# Patient Record
Sex: Female | Born: 1970
Health system: Southern US, Community
[De-identification: ages and names within clinical notes are randomized; demographics above are authoritative.]

## PROBLEM LIST (undated history)

## (undated) DIAGNOSIS — K8020 Calculus of gallbladder without cholecystitis without obstruction: Secondary | ICD-10-CM

## (undated) DIAGNOSIS — F329 Major depressive disorder, single episode, unspecified: Secondary | ICD-10-CM

## (undated) DIAGNOSIS — Z800 Family history of malignant neoplasm of digestive organs: Secondary | ICD-10-CM

## (undated) DIAGNOSIS — Z806 Family history of leukemia: Secondary | ICD-10-CM

## (undated) DIAGNOSIS — Z801 Family history of malignant neoplasm of trachea, bronchus and lung: Secondary | ICD-10-CM

## (undated) DIAGNOSIS — E119 Type 2 diabetes mellitus without complications: Secondary | ICD-10-CM

## (undated) DIAGNOSIS — E039 Hypothyroidism, unspecified: Secondary | ICD-10-CM

## (undated) DIAGNOSIS — Z8042 Family history of malignant neoplasm of prostate: Secondary | ICD-10-CM

## (undated) DIAGNOSIS — F419 Anxiety disorder, unspecified: Secondary | ICD-10-CM

## (undated) DIAGNOSIS — J189 Pneumonia, unspecified organism: Secondary | ICD-10-CM

## (undated) DIAGNOSIS — Z803 Family history of malignant neoplasm of breast: Secondary | ICD-10-CM

## (undated) DIAGNOSIS — Z975 Presence of (intrauterine) contraceptive device: Secondary | ICD-10-CM

## (undated) DIAGNOSIS — G4730 Sleep apnea, unspecified: Secondary | ICD-10-CM

## (undated) HISTORY — DX: Family history of malignant neoplasm of breast: Z80.3

## (undated) HISTORY — DX: Anxiety disorder, unspecified: F41.9

## (undated) HISTORY — DX: Family history of malignant neoplasm of trachea, bronchus and lung: Z80.1

## (undated) HISTORY — DX: Family history of malignant neoplasm of prostate: Z80.42

## (undated) HISTORY — DX: Major depressive disorder, single episode, unspecified: F32.9

## (undated) HISTORY — DX: Family history of malignant neoplasm of digestive organs: Z80.0

## (undated) HISTORY — DX: Hypothyroidism, unspecified: E03.9

## (undated) HISTORY — DX: Family history of leukemia: Z80.6

## (undated) HISTORY — DX: Calculus of gallbladder without cholecystitis without obstruction: K80.20

## (undated) HISTORY — DX: Presence of (intrauterine) contraceptive device: Z97.5

---

## 1998-05-16 HISTORY — PX: CHOLECYSTECTOMY: SHX55

## 2001-08-14 ENCOUNTER — Encounter: Payer: Self-pay | Admitting: Family Medicine

## 2006-11-24 ENCOUNTER — Ambulatory Visit: Payer: Self-pay | Admitting: Family Medicine

## 2006-11-24 DIAGNOSIS — R5383 Other fatigue: Secondary | ICD-10-CM

## 2006-11-24 DIAGNOSIS — R5381 Other malaise: Secondary | ICD-10-CM | POA: Insufficient documentation

## 2006-11-25 LAB — CONVERTED CEMR LAB
ALT: 25 units/L (ref 0–35)
AST: 22 units/L (ref 0–37)
Basophils Relative: 0.3 % (ref 0.0–1.0)
Bilirubin, Direct: 0.1 mg/dL (ref 0.0–0.3)
CO2: 29 meq/L (ref 19–32)
Calcium: 10.3 mg/dL (ref 8.4–10.5)
Chloride: 102 meq/L (ref 96–112)
Eosinophils Relative: 0.9 % (ref 0.0–5.0)
GFR calc Af Amer: 104 mL/min
Glucose, Bld: 96 mg/dL (ref 70–99)
HCT: 42.9 % (ref 36.0–46.0)
Lymphocytes Relative: 26.2 % (ref 12.0–46.0)
Neutro Abs: 7.3 10*3/uL (ref 1.4–7.7)
Neutrophils Relative %: 66.6 % (ref 43.0–77.0)
Platelets: 289 10*3/uL (ref 150–400)
Total Protein: 7.9 g/dL (ref 6.0–8.3)
Triglycerides: 150 mg/dL — ABNORMAL HIGH (ref 0–149)
VLDL: 30 mg/dL (ref 0–40)
WBC: 11 10*3/uL — ABNORMAL HIGH (ref 4.5–10.5)

## 2006-12-14 ENCOUNTER — Encounter: Payer: Self-pay | Admitting: Family Medicine

## 2006-12-14 DIAGNOSIS — E039 Hypothyroidism, unspecified: Secondary | ICD-10-CM | POA: Insufficient documentation

## 2007-01-12 ENCOUNTER — Ambulatory Visit: Payer: Self-pay | Admitting: Family Medicine

## 2007-01-12 DIAGNOSIS — F39 Unspecified mood [affective] disorder: Secondary | ICD-10-CM | POA: Insufficient documentation

## 2007-02-28 ENCOUNTER — Ambulatory Visit: Payer: Self-pay | Admitting: Family Medicine

## 2007-08-20 ENCOUNTER — Ambulatory Visit: Payer: Self-pay | Admitting: Family Medicine

## 2007-09-17 ENCOUNTER — Telehealth: Payer: Self-pay | Admitting: Family Medicine

## 2007-10-12 ENCOUNTER — Ambulatory Visit: Payer: Self-pay | Admitting: Unknown Physician Specialty

## 2007-10-16 ENCOUNTER — Encounter: Payer: Self-pay | Admitting: Family Medicine

## 2007-10-16 ENCOUNTER — Ambulatory Visit: Payer: Self-pay | Admitting: Unknown Physician Specialty

## 2008-02-04 ENCOUNTER — Telehealth: Payer: Self-pay | Admitting: Family Medicine

## 2009-02-17 ENCOUNTER — Encounter (INDEPENDENT_AMBULATORY_CARE_PROVIDER_SITE_OTHER): Payer: Self-pay | Admitting: *Deleted

## 2009-02-17 ENCOUNTER — Telehealth: Payer: Self-pay | Admitting: Family Medicine

## 2009-03-05 ENCOUNTER — Ambulatory Visit: Payer: Self-pay | Admitting: Family Medicine

## 2009-04-02 ENCOUNTER — Telehealth: Payer: Self-pay | Admitting: Family Medicine

## 2009-12-08 ENCOUNTER — Telehealth: Payer: Self-pay | Admitting: Family Medicine

## 2009-12-17 ENCOUNTER — Encounter (INDEPENDENT_AMBULATORY_CARE_PROVIDER_SITE_OTHER): Payer: Self-pay | Admitting: *Deleted

## 2010-02-09 ENCOUNTER — Telehealth: Payer: Self-pay | Admitting: Family Medicine

## 2010-06-17 NOTE — Letter (Signed)
Summary: Nadara Eaton letter  [facility] [facility]  [address] [place], [place] [address]   Phone: [phone]  Fax: [phone]       12/17/2009 MRN: [id number]  MONZERAT HANDLER [address] [place], [place]  [address]  Dear Ms. [facility] [place], and [place] announce the retirement of Arta Silence, M.D., from full-time practice at the [facility] office effective November 12, 2009 and his plans of returning part-time.  It is important to Dr. Hetty Ely and to our practice that you understand that [facility] has seven physicians in our office for your health care needs.  We will continue to offer the same exceptional care that you have today.    Dr. Hetty Ely has spoken to many of you about his plans for retirement and returning part-time in the fall.   We will continue to work with you through the transition to schedule appointments for you in the office and meet the high standards that [place] is committed to.   Again, it is with great pleasure that we share the news that Dr. Hetty Ely will return to [facility] at [facility] in October of 2011 with a reduced schedule.    If you have any questions, or would like to request an appointment with one of our physicians, please call us at [phone] and press the option for Scheduling an appointment.  We take pleasure in providing you with excellent patient care and look forward to seeing you at your next office visit.  Our [facility] Physicians are:  Tillman Abide, M.D. Laurita Quint, M.D. Roxy Manns, M.D. Kerby Nora, M.D. Hannah Beat, M.D. Ruthe Mannan, M.D. We proudly welcomed Raechel Ache, M.D. and Eustaquio Boyden, M.D. to the practice in July/August 2011.  Sincerely,  [facility]

## 2010-06-17 NOTE — Progress Notes (Signed)
Summary: patches for motion sickness  Phone Note Call from Patient   Caller: Patient Call For: Dr. Para March  Summary of Call: Pt is going on a 10 day cruise and is asking if he can have [facility] [address]  Initial call taken by: Melody Comas,  December 08, 2009 2:18 PM  Follow-up for Phone Call        yes, please send in.  1.5mg  scopolamine patch, 1 patch q3 days as needed.  #4, no rf.  Follow-up by: Crawford Givens MD,  December 08, 2009 2:42 PM    New/Updated Medications: TRANSDERM-SCOP 1.5 MG PT72 (SCOPOLAMINE BASE) Use one patch every 3 days as needed Prescriptions: TRANSDERM-SCOP 1.5 MG PT72 (SCOPOLAMINE BASE) Use one patch every 3 days as needed  #1 box x 0   Entered by:   Delilah Shan CMA (AAMA)   Authorized by:   Crawford Givens MD   Signed by:   Delilah Shan CMA (AAMA) on 12/08/2009   Method used:   [facility]        [facility]  [address] [id number]       [address]       [place], [place]  [address]       Ph: [phone]       Fax: [phone]   RxID:   [id number]

## 2010-06-17 NOTE — Progress Notes (Signed)
Summary: Rx Zoloft  Phone Note Refill Request Call back at [phone] [facility] on February 09, 2010 2:56 PM  Refills Requested: Medication #1:  ZOLOFT 25 MG  TABS Take 1 tablet by mouth once a day Received e-scribe refill request   Method Requested: Electronic Initial call taken by: Sydell Axon LPN,  February 09, 2010 2:57 PM  Follow-up for Phone Call        please verify the pharmacy and send in.  patient needs ov this fall, last seen 2010. Follow-up by: Crawford Givens MD,  February 09, 2010 3:10 PM    Prescriptions: ZOLOFT 25 MG  TABS (SERTRALINE HCL) Take 1 tablet by mouth once a day  #30 x 3   Entered by:   Delilah Shan CMA (AAMA)   Authorized by:   Crawford Givens MD   Signed by:   Delilah Shan CMA (AAMA) on 02/09/2010   Method used:   [facility]        [facility]  [address] [id number]       [address]       [place], [place]  [address]       Ph: [phone]       Fax: [phone]   RxID:   [id number] ZOLOFT 25 MG  TABS (SERTRALINE HCL) Take 1 tablet by mouth once a day  #30 x 3   Entered and Authorized by:   Crawford Givens MD   Signed by:   Crawford Givens MD on 02/09/2010   Method used:   Historical   RxID:   [id number]

## 2010-08-26 ENCOUNTER — Encounter: Payer: Self-pay | Admitting: Family Medicine

## 2010-08-30 ENCOUNTER — Other Ambulatory Visit: Payer: Self-pay | Admitting: Family Medicine

## 2010-08-30 ENCOUNTER — Other Ambulatory Visit (INDEPENDENT_AMBULATORY_CARE_PROVIDER_SITE_OTHER): Payer: BC Managed Care – PPO | Admitting: Family Medicine

## 2010-08-30 DIAGNOSIS — E039 Hypothyroidism, unspecified: Secondary | ICD-10-CM

## 2010-09-03 ENCOUNTER — Ambulatory Visit (INDEPENDENT_AMBULATORY_CARE_PROVIDER_SITE_OTHER): Payer: BC Managed Care – PPO | Admitting: Family Medicine

## 2010-09-03 ENCOUNTER — Encounter: Payer: Self-pay | Admitting: Family Medicine

## 2010-09-03 VITALS — BP 122/76 | HR 88 | Temp 98.2°F | Ht 64.0 in | Wt 179.0 lb

## 2010-09-03 DIAGNOSIS — Z0000 Encounter for general adult medical examination without abnormal findings: Secondary | ICD-10-CM

## 2010-09-03 DIAGNOSIS — Z23 Encounter for immunization: Secondary | ICD-10-CM

## 2010-09-03 DIAGNOSIS — E039 Hypothyroidism, unspecified: Secondary | ICD-10-CM

## 2010-09-03 MED ORDER — TETANUS-DIPHTH-ACELL PERTUSSIS 5-2.5-18.5 LF-MCG/0.5 IM SUSP: 0.5000 mL | Freq: Once | INTRAMUSCULAR | Status: DC

## 2010-09-03 NOTE — Patient Instructions (Signed)
I would keep exercising and try to get regularly scheduled meals.  If your weight isn't decreasing, then look at your total daily calorie intake and decrease by 100 cal.  I would check your labs (TSH, lipids, sugar) next year.  Take care.  Let us know if you have other concerns.

## 2010-09-03 NOTE — Progress Notes (Signed)
CPE- See plan.  Routine anticipatory guidance given to patient.  See health maintenance. Pap and mammo per gyn  Hypothyroidism- labs d/w pt.  No sx.  No meds.   Feeling well w/o complaints of any variety except for nodular area on b mid axillary line  PMH and SH reviewed  Meds, vitals, and allergies reviewed.   ROS: See HPI.  Otherwise negative.    GEN: nad, alert and oriented HEENT: mucous membranes moist NECK: supple w/o LA, no tmg CV: rrr. PULM: ctab, no inc wob ABD: soft, +bs EXT: no edema SKIN: no acute rash but nodular areas of likely subcutaneous fat palpated on abdominal wall.

## 2010-09-05 ENCOUNTER — Encounter: Payer: Self-pay | Admitting: Family Medicine

## 2010-09-05 NOTE — Assessment & Plan Note (Signed)
I would check TSH episodically, sooner if she has sx.  No tx needed now.

## 2010-09-05 NOTE — Assessment & Plan Note (Signed)
D/w pt.  We didn't check sugar and lipids as these were prev normal. D/w pt about exercise and weight, dec in calories gradually if no dec in weight with inc in exercise.  She understood.  Flu shot encouraged.  Tdap today.  I would check lipids, sugar in 1-2 years.

## 2011-02-17 ENCOUNTER — Encounter: Payer: Self-pay | Admitting: Family Medicine

## 2011-02-17 ENCOUNTER — Ambulatory Visit (INDEPENDENT_AMBULATORY_CARE_PROVIDER_SITE_OTHER): Payer: BC Managed Care – PPO | Admitting: Family Medicine

## 2011-02-17 VITALS — BP 104/72 | HR 86 | Temp 98.2°F | Wt 173.0 lb

## 2011-02-17 DIAGNOSIS — F419 Anxiety disorder, unspecified: Secondary | ICD-10-CM

## 2011-02-17 DIAGNOSIS — Z23 Encounter for immunization: Secondary | ICD-10-CM

## 2011-02-17 DIAGNOSIS — F411 Generalized anxiety disorder: Secondary | ICD-10-CM

## 2011-02-17 MED ORDER — SERTRALINE HCL 25 MG PO TABS: 25.0000 mg | ORAL_TABLET | Freq: Every day | ORAL | Status: DC

## 2011-02-17 NOTE — Progress Notes (Signed)
She has a teenage son and a lot of increase in stress in your life.  She is in counseling for anxiety.  "Today I'm fine, but some days are better than others."  She wanted to talk about going back on meds, prev on sertraline with relief.  No SI/HI.  Sleeping okay now, better than a few weeks ago.  Energy level is better in last few weeks.  Timing of anxiety sx are variable, disagreements with her son is a trigger.  Anxiety episode: crying, upset, nervous.  Episodes may be brief or may last longer.  She thinks she is in a better position now than a few weeks ago.  She initially had HA with sertraline before, but this improved after about 1 month.    Meds, vitals, and allergies reviewed.   ROS: See HPI.  Otherwise, noncontributory.  nad ncat rrr ctab Speech wnl, affect wnl and judgment appears intact

## 2011-02-17 NOTE — Patient Instructions (Signed)
Hold onto the rx for the sertraline and let me know if you aren't improving or if you need to start the medicine.  Let me know if you have other concerns.  Take care.  I'm glad you started with counseling.

## 2011-02-18 ENCOUNTER — Encounter: Payer: Self-pay | Admitting: Family Medicine

## 2011-02-18 DIAGNOSIS — F419 Anxiety disorder, unspecified: Secondary | ICD-10-CM | POA: Insufficient documentation

## 2011-02-18 NOTE — Assessment & Plan Note (Signed)
>  25 min spent with face to face with patient, >50% counseling.  She is in counseling and improving per report.  She does have to go back on meds, esp if she continue to improve.  No SI/HI.  Okay for outpatient f/u.  She'll continue with counseling and will restart SSRI if sx don't continue to improve. If that's the case, she'll notify the clinic.  She agrees.

## 2011-12-13 ENCOUNTER — Telehealth: Payer: Self-pay | Admitting: Family Medicine

## 2011-12-13 NOTE — Telephone Encounter (Signed)
PATIENT REQUESTING VERIFICATION OF LAST TETANUS IMMUNIZATION DUE TO TRAVEL ARRANGEMENTS. RN REVIEWED EPIC EHR: TDaP (BOOSTRIX) injection 0.5 mL (Discontinued) 0.5 mL Once 09/03/2010 02/18/2011 Route: Intramuscular RN UNABLE TO VERIFY DUE TO ABOVE DOCUMENTATION OF "DISCONTINUED".  RN spoke with Lyla Son, in office, to verify above Tetanus immunization hx. Lyla Son, in office, verified that patient did receive Tetanus immunization on 09/03/2010. Patient informed of above. Verbalizes understanding.

## 2012-01-18 ENCOUNTER — Ambulatory Visit (INDEPENDENT_AMBULATORY_CARE_PROVIDER_SITE_OTHER): Payer: BC Managed Care – PPO | Admitting: Family Medicine

## 2012-01-18 ENCOUNTER — Encounter: Payer: Self-pay | Admitting: Family Medicine

## 2012-01-18 VITALS — BP 110/68 | HR 80 | Temp 98.6°F | Ht 64.0 in | Wt 174.2 lb

## 2012-01-18 DIAGNOSIS — M25519 Pain in unspecified shoulder: Secondary | ICD-10-CM

## 2012-01-18 NOTE — Assessment & Plan Note (Signed)
Likely cuff irritation.  Would try higher dose of ibuprofen with GI caution and home exercises.  If not improved, then schedule f/u with Dr. Patsy Lager for further eval and possible injection.  She agrees.  Anatomy d/w pt.

## 2012-01-18 NOTE — Progress Notes (Signed)
L shoulder pain with certain movements.  Started about 2 months ago, gradually worse in meantime.  No help with chiropractor treatment.  Pain with reaching back, ie for back pocket.  Pain on lateral prox upper arm, occ radiates down the arm.  No injury, no popping, no clicking.  No trauma.  No elbow or hand pain.  On R sided sx.   Feeling well o/w.    Meds, vitals, and allergies reviewed.   ROS: See HPI.  Otherwise, noncontributory.  nad ncat Neck with normal ROM L shoulder with normal ROM Pain on int/ext rotation. Pain on supraspinatus testing No AC pain on testing Speed's neg No arm drop Distally nv intact

## 2012-01-18 NOTE — Patient Instructions (Addendum)
Take 2-3 ibuprofen with food 2-3 times a day.   Use the exercises and if not improved I want you to see Dr. Copland.   Take care.   

## 2012-05-07 ENCOUNTER — Other Ambulatory Visit: Payer: Self-pay

## 2012-05-07 MED ORDER — SCOPOLAMINE 1 MG/3DAYS TD PT72: 1.0000 | MEDICATED_PATCH | TRANSDERMAL | Status: DC

## 2012-05-07 NOTE — Telephone Encounter (Signed)
Sent. Thanks.   

## 2012-05-07 NOTE — Telephone Encounter (Signed)
Pt left v/m requesting [facility]; going on cruise next week.Please advise.

## 2012-06-04 ENCOUNTER — Encounter: Payer: Self-pay | Admitting: Family Medicine

## 2012-06-04 ENCOUNTER — Ambulatory Visit (INDEPENDENT_AMBULATORY_CARE_PROVIDER_SITE_OTHER): Payer: BC Managed Care – PPO | Admitting: Family Medicine

## 2012-06-04 VITALS — BP 114/82 | HR 103 | Temp 98.6°F | Wt 179.0 lb

## 2012-06-04 DIAGNOSIS — R51 Headache: Secondary | ICD-10-CM

## 2012-06-04 NOTE — Patient Instructions (Addendum)
I would avoid lifting for a few more days.  When you have no pain, then start back stretching (especially your neck).  Then proceed to light lifting.  This should all improve in the next few days.  If you have concerns or persistent pain, then notify the clinic. The flexeril may continue to help some.  Sedation caution.

## 2012-06-04 NOTE — Progress Notes (Signed)
She is getting pain on the back of her head that radiates around the front of the head equally, burning pain.  Over the last 5 days she also has R>L frontal pain this is persistent.  She had been exercising, recent change.  The discomfort in her neck started then.  She noted a lump on the R posterior scalp recently.  No FCNAVD.  No ear pain.  No hearing changes. No rhinorrhea, no ST.  No vision changes.  No focal changes or pain in the ext x4.  Taking flexeril helped some with the HA, not completely.  It did make her drowsy.  Tylenol/advil/alevel weren't helping much.   No injury or trauma o/w.   Meds, vitals, and allergies reviewed.   ROS: See HPI.  Otherwise, noncontributory.  nad ncat Scalp with patches of psoriasis noted Normal ROM at the neck Isolated small and nontender lymph noted notes on R occiput No LA on neck , clavicle, head o/w rrr ctab Tm wnl Nasal and OP exam wnl CN 2-12 wnl B, S/S/DTR wnl x4 Sinuses not ttp

## 2012-06-05 DIAGNOSIS — R51 Headache: Secondary | ICD-10-CM | POA: Insufficient documentation

## 2012-06-05 DIAGNOSIS — R519 Headache, unspecified: Secondary | ICD-10-CM | POA: Insufficient documentation

## 2012-06-05 NOTE — Assessment & Plan Note (Signed)
Likely with an occipital neuralgia (related to exercise, strain; improving) and resultant frontal HA (possible migraine variant- h/o migraine noted, improving) with incidental isolated LA noted.  Would avoid exercise, neck strain for now.  Then return to stretching and then gradual return to exercise.  She can use the flexeril as needed with 5mg  at a time if needed due to sedation.  Her exam is o/w nonfocal.  She should gradually improve.  No indication for imaging now.  She agrees, understands, f/u prn.

## 2012-06-06 ENCOUNTER — Telehealth: Payer: Self-pay | Admitting: *Deleted

## 2012-06-06 MED ORDER — GABAPENTIN 100 MG PO CAPS: 100.0000 mg | ORAL_CAPSULE | Freq: Three times a day (TID) | ORAL | Status: DC

## 2012-06-06 NOTE — Telephone Encounter (Signed)
Pt seen recently for headache. Yesterday she was putting a jug of milk away and headache started again. Last night it was very intense, but today it is more of a dull ache. She has taken Flexeril, Advil, and Aleve but so far nothing has helped get rid of headache. She wants to know if you can recommend anything or prescribe another med that might help.

## 2012-06-06 NOTE — Telephone Encounter (Signed)
Caller: Ceira/Patient; Phone: [phone]; Reason for Call: Returning call to office staff/Lori from message left approximately at 1445.  Per Epic message from Dr Para March 06/06/12, instructed to "limit as much lifting as possible.  She can try uptitration of gabapentin, 100mg  tid x1-2 days, then 200mg  tid x1-2 days, then 300mg  tid.  Rx sent.  If not improved, we can set her up with neuro."  Sedation precautions discussed with the Gabapentin.  Message delivered; No need to call back.

## 2012-06-06 NOTE — Telephone Encounter (Signed)
I would limit as much lifting as possible.  She can try uptitration of gabapentin, 100mg  tid x1-2 days, then 200mg  tid x1-2 days, then 300mg  tid.  rx sent.  If not improved, we can set her up with neuro.  Sedation caution on the gabapentin.

## 2012-06-06 NOTE — Telephone Encounter (Signed)
Left message asking patient to call back

## 2012-06-20 ENCOUNTER — Telehealth: Payer: Self-pay

## 2012-06-20 MED ORDER — GABAPENTIN 300 MG PO CAPS: 300.0000 mg | ORAL_CAPSULE | Freq: Three times a day (TID) | ORAL | Status: DC

## 2012-06-20 NOTE — Telephone Encounter (Signed)
Please verify with patient.  If she is talking 300mg  (3x100mg ) at a time, 3 times a day with relief, then send in new rx (with new strength) for 300mg /tab- 1 tab tid.  #90 3rf.  Thanks.  Otherwise, let me know.

## 2012-06-20 NOTE — Telephone Encounter (Signed)
Verified dosage with patient.  Explained to her that new script is being sent to pharmacy, for her to take one capsule three times a day.  [facility]

## 2012-06-20 NOTE — Telephone Encounter (Signed)
Pt left v/m pt was taking Gabapentin 100 mg and pt taking 1 cap tid for 1-2 days, then 2 caps tid for 1-2 days and then 3 cap tid. Pt tried to refill #90 and pharmacy said too early. Pt request call back to verify instructions on how to take med and what can do to get refill.Please advise.

## 2013-06-03 ENCOUNTER — Encounter: Payer: BC Managed Care – PPO | Admitting: Family Medicine

## 2013-06-04 ENCOUNTER — Other Ambulatory Visit (INDEPENDENT_AMBULATORY_CARE_PROVIDER_SITE_OTHER): Payer: BC Managed Care – PPO

## 2013-06-04 ENCOUNTER — Other Ambulatory Visit: Payer: Self-pay | Admitting: Family Medicine

## 2013-06-04 ENCOUNTER — Other Ambulatory Visit: Payer: BC Managed Care – PPO | Admitting: Family Medicine

## 2013-06-04 DIAGNOSIS — Z131 Encounter for screening for diabetes mellitus: Secondary | ICD-10-CM

## 2013-06-04 DIAGNOSIS — Z13220 Encounter for screening for lipoid disorders: Secondary | ICD-10-CM

## 2013-06-04 DIAGNOSIS — E039 Hypothyroidism, unspecified: Secondary | ICD-10-CM

## 2013-06-04 LAB — LIPID PANEL
CHOL/HDL RATIO: 3
CHOLESTEROL: 132 mg/dL (ref 0–200)
HDL: 40.6 mg/dL (ref >39.00)
LDL CALC: 66 mg/dL (ref 0–99)
TRIGLYCERIDES: 129 mg/dL (ref 0.0–149.0)
VLDL: 25.8 mg/dL (ref 0.0–40.0)

## 2013-06-04 LAB — GLUCOSE, RANDOM: Glucose, Bld: 106 mg/dL — ABNORMAL HIGH (ref 70–99)

## 2013-06-04 LAB — TSH: TSH: 4.75 u[IU]/mL (ref 0.35–5.50)

## 2013-06-10 ENCOUNTER — Encounter: Payer: BC Managed Care – PPO | Admitting: Family Medicine

## 2013-06-17 ENCOUNTER — Encounter: Payer: Self-pay | Admitting: Family Medicine

## 2013-06-17 ENCOUNTER — Ambulatory Visit (INDEPENDENT_AMBULATORY_CARE_PROVIDER_SITE_OTHER): Payer: BC Managed Care – PPO | Admitting: Family Medicine

## 2013-06-17 ENCOUNTER — Encounter: Payer: BC Managed Care – PPO | Admitting: Family Medicine

## 2013-06-17 VITALS — BP 118/80 | HR 92 | Temp 98.6°F | Ht 63.25 in | Wt 176.0 lb

## 2013-06-17 DIAGNOSIS — F419 Anxiety disorder, unspecified: Secondary | ICD-10-CM

## 2013-06-17 DIAGNOSIS — F411 Generalized anxiety disorder: Secondary | ICD-10-CM

## 2013-06-17 MED ORDER — ESCITALOPRAM OXALATE 10 MG PO TABS: 10.0000 mg | ORAL_TABLET | Freq: Every day | ORAL | Status: DC

## 2013-06-17 MED ORDER — CYCLOBENZAPRINE HCL 10 MG PO TABS: 5.0000 mg | ORAL_TABLET | Freq: Every day | ORAL | Status: DC | PRN

## 2013-06-17 NOTE — Progress Notes (Signed)
Pre-visit discussion using our clinic review tool. No additional management support is needed unless otherwise documented below in the visit note.  Came in for CPE.  Tabled due to concerns.   Husband just told her he wanted a divorce.  Prev in counseling for marriage.  Married 18 years.  Safe at home.  She is worried about his decisions re: their marriage. 2 kids, they don't know about recent changes.  She has talked with counselor, will do so again today.  HA, tearful, panic sx, shaky, SOB, tachycardia, insomnia in meantime. Interested in med tx.  No SI/HI.   Meds, vitals, and allergies reviewed.   ROS: See HPI.  Otherwise, noncontributory.  GEN: nad but tearful, alert and oriented, speech fluent HEENT: mucous membranes moist NECK: supple w/o LA CV: rrr.  PULM: ctab, no inc wob ABD: soft, +bs

## 2013-06-17 NOTE — Patient Instructions (Signed)
Use the flexeril as needed for the headaches or for insomnia.  Start on lexapro in the meantime for your mood and update me in a few days.   Call the counselor about your situation.

## 2013-06-17 NOTE — Assessment & Plan Note (Signed)
Sig social upheaval.  Safe at home. She'll call counselor re: options at this point.  Start lexapro, didn't tolerate zoloft with headaches.  She may tolerate alternate SSRI.  She agrees.  Call back in a few days with update.  >25 min spent with face to face with patient, >50% counseling and/or coordinating care.

## 2013-08-11 ENCOUNTER — Other Ambulatory Visit: Payer: Self-pay | Admitting: Family Medicine

## 2013-08-12 NOTE — Telephone Encounter (Signed)
Sent!

## 2013-08-12 NOTE — Telephone Encounter (Signed)
Electronic refill request. Last seen:  06/17/13.  Last filled:  30 tablets with 1RF on  06/17/2013.

## 2013-09-05 ENCOUNTER — Ambulatory Visit (INDEPENDENT_AMBULATORY_CARE_PROVIDER_SITE_OTHER): Payer: BC Managed Care – PPO | Admitting: Family Medicine

## 2013-09-05 ENCOUNTER — Encounter: Payer: Self-pay | Admitting: Family Medicine

## 2013-09-05 VITALS — BP 104/88 | HR 90 | Temp 98.1°F | Wt 151.1 lb

## 2013-09-05 DIAGNOSIS — F419 Anxiety disorder, unspecified: Secondary | ICD-10-CM

## 2013-09-05 DIAGNOSIS — F411 Generalized anxiety disorder: Secondary | ICD-10-CM

## 2013-09-05 MED ORDER — ESCITALOPRAM OXALATE 10 MG PO TABS: 10.0000 mg | ORAL_TABLET | Freq: Every day | ORAL | Status: DC

## 2013-09-05 NOTE — Progress Notes (Signed)
Pre visit review using our clinic review tool, if applicable. No additional management support is needed unless otherwise documented below in the visit note.  Divorce likely pending . Husband out of house.  Safe at home.  Caring for kids.  "hurt, sad, disappointed."  Tearful, dec in concentration.  No SI/HI.  Early waking.  Increased SSRI with some mild help.  In therapy.    Meds, vitals, and allergies reviewed.   ROS: See HPI.  Otherwise, noncontributory.  nad ncat Mmm rrr ctab Ext w/o edema Speech wnl

## 2013-09-05 NOTE — Patient Instructions (Signed)
Use the higher dose of lexapro in the meantime and update me as needed. Take care.

## 2013-09-06 NOTE — Assessment & Plan Note (Signed)
Continue higher dose of lexapro and continue counseling.  Safe at home. Update me as needed.  She agrees.

## 2013-10-07 ENCOUNTER — Other Ambulatory Visit: Payer: Self-pay | Admitting: Family Medicine

## 2013-10-08 NOTE — Telephone Encounter (Signed)
Last filled 3/15--last office visit was 09/05/13--please advise if okay to refill

## 2013-10-09 NOTE — Telephone Encounter (Signed)
Sent. Thanks.   

## 2013-12-21 ENCOUNTER — Other Ambulatory Visit: Payer: Self-pay | Admitting: Family Medicine

## 2013-12-23 NOTE — Telephone Encounter (Signed)
Electronic refill request, please advise  

## 2013-12-24 NOTE — Telephone Encounter (Signed)
Sent!

## 2014-01-11 ENCOUNTER — Other Ambulatory Visit: Payer: Self-pay | Admitting: Family Medicine

## 2014-02-25 ENCOUNTER — Other Ambulatory Visit: Payer: Self-pay | Admitting: Family Medicine

## 2014-02-25 NOTE — Telephone Encounter (Signed)
Received refill request electronically. Last refill 12/24/13 #30/1, last office visit 09/05/13. Is it okay to refill medication?

## 2014-02-26 NOTE — Telephone Encounter (Signed)
Sent. Thanks.   

## 2014-04-29 ENCOUNTER — Other Ambulatory Visit: Payer: Self-pay | Admitting: Family Medicine

## 2014-04-29 NOTE — Telephone Encounter (Signed)
Sent. Thanks.   

## 2014-04-29 NOTE — Telephone Encounter (Signed)
Electronic refill request. Last Filled:    30 tablet 1RF on 02/26/2014

## 2014-06-19 ENCOUNTER — Ambulatory Visit (INDEPENDENT_AMBULATORY_CARE_PROVIDER_SITE_OTHER): Payer: BLUE CROSS/BLUE SHIELD | Admitting: Family Medicine

## 2014-06-19 ENCOUNTER — Encounter: Payer: Self-pay | Admitting: Family Medicine

## 2014-06-19 VITALS — BP 110/60 | HR 86 | Temp 98.5°F | Wt 162.8 lb

## 2014-06-19 DIAGNOSIS — F419 Anxiety disorder, unspecified: Secondary | ICD-10-CM

## 2014-06-19 MED ORDER — ESCITALOPRAM OXALATE 20 MG PO TABS: 10.0000 mg | ORAL_TABLET | Freq: Every day | ORAL | Status: DC

## 2014-06-19 NOTE — Patient Instructions (Signed)
Increase the lexapro to 20mg , keep working with counseling and take care.  Glad to see you.   Please update me in 2-4 weeks, especially if you aren't improving.

## 2014-06-19 NOTE — Progress Notes (Signed)
Pre visit review using our clinic review tool, if applicable. No additional management support is needed unless otherwise documented below in the visit note.  Divorce is still pending.  Currently separated.  She is approaching the 1 year mark, still trying to get divorced processed.  She is concerned about her kid's and their adjustment.  She is trying to get her oldest son through college application process.  Still on lexapro 10mg  a day, had helped, still helps, but the effect is less.  She is in counseling, but not getting much exercise.  Sleep is still good.    Her father had prostate cancer, now with recurrence, and is inpatient currently with a DVT.  Her mother just found out about a prev unknown half sister (patient's half aunt).  Meds, vitals, and allergies reviewed.   ROS: See HPI.  Otherwise, noncontributory.  nad ncat Mmm Neck supple, no LA rrr ctab abd soft Ext w/o edema Speech and judgment intact wnl

## 2014-06-20 NOTE — Assessment & Plan Note (Signed)
Inc lexapro to 20mg , continue counseling, she'll try to get some exercise.  Okay for outpatient fu.  Update me as needed.  She agrees.

## 2014-06-27 ENCOUNTER — Telehealth: Payer: Self-pay

## 2014-06-27 MED ORDER — ESCITALOPRAM OXALATE 20 MG PO TABS
20.0000 mg | ORAL_TABLET | Freq: Every day | ORAL | Status: DC
Start: 2014-06-27 — End: 2014-10-06

## 2014-06-27 NOTE — Telephone Encounter (Signed)
Patient advised.

## 2014-06-27 NOTE — Telephone Encounter (Signed)
20mg  a day is correct.  That was my mistake and I apologize to all involved.   I prev changed the amount per pill, but didn't change the amount of mg per dose.   I resent it with 20mg  pills, 20mg  per dose, daily.   That should handle it.  Thanks for helping me fix this and again I apologize.

## 2014-06-27 NOTE — Telephone Encounter (Signed)
Pt left v/m; pt seen 06/19/14 and pt picked up lexapro 20 mg rx with instructions take 1/2 tab daily. Pt wants to verify that pt was supposed to increase the lexapro to 20 mg one tab daily. [facility]. Have not changed med list until [facility] by Dr Damita Dunnings.

## 2014-06-29 ENCOUNTER — Other Ambulatory Visit: Payer: Self-pay | Admitting: Family Medicine

## 2014-06-30 NOTE — Telephone Encounter (Signed)
Electronic refill request. Last Filled:    30 tablet 1 RF on 04/29/2014  Please advise.

## 2014-06-30 NOTE — Telephone Encounter (Signed)
Sent. Thanks.   

## 2014-08-24 ENCOUNTER — Other Ambulatory Visit: Payer: Self-pay | Admitting: Family Medicine

## 2014-08-25 NOTE — Telephone Encounter (Signed)
Electronic refill request. Last Filled:    30 tablet 1 RF on 06/30/2014  Please advise.

## 2014-08-26 NOTE — Telephone Encounter (Signed)
Sent. Thanks.   

## 2014-09-04 ENCOUNTER — Other Ambulatory Visit: Payer: Self-pay

## 2014-09-04 MED ORDER — SCOPOLAMINE 1 MG/3DAYS TD PT72: 1.0000 | MEDICATED_PATCH | TRANSDERMAL | Status: DC

## 2014-09-04 NOTE — Telephone Encounter (Signed)
Pt is going out on fishing boat for one day and request transderm scope patch to [place].Please advise.

## 2014-09-04 NOTE — Telephone Encounter (Signed)
Sent in

## 2014-10-06 ENCOUNTER — Ambulatory Visit (INDEPENDENT_AMBULATORY_CARE_PROVIDER_SITE_OTHER): Payer: BLUE CROSS/BLUE SHIELD | Admitting: Family Medicine

## 2014-10-06 ENCOUNTER — Encounter: Payer: Self-pay | Admitting: Family Medicine

## 2014-10-06 VITALS — BP 104/64 | HR 68 | Temp 99.1°F | Wt 161.5 lb

## 2014-10-06 DIAGNOSIS — F419 Anxiety disorder, unspecified: Secondary | ICD-10-CM

## 2014-10-06 DIAGNOSIS — R059 Cough, unspecified: Secondary | ICD-10-CM | POA: Insufficient documentation

## 2014-10-06 DIAGNOSIS — R05 Cough: Secondary | ICD-10-CM

## 2014-10-06 MED ORDER — ESCITALOPRAM OXALATE 20 MG PO TABS: 10.0000 mg | ORAL_TABLET | Freq: Every day | ORAL | Status: DC

## 2014-10-06 MED ORDER — BENZONATATE 200 MG PO CAPS: 200.0000 mg | ORAL_CAPSULE | Freq: Three times a day (TID) | ORAL | Status: DC | PRN

## 2014-10-06 NOTE — Progress Notes (Signed)
Pre visit review using our clinic review tool, if applicable. No additional management support is needed unless otherwise documented below in the visit note.  Her divorce is pending.  She is back to 10mg  of lexapro and doing well with that.  Her sons are still in counseling.  She is safe at home.   She had a cough that started about 1 week ago.  Since then, more cough, some occ AM sputum, discolored.  Coughing fits.  Chest feels heavy.  No fevers.  Some occ wheezing (or more likely UAN).  No one lese is sick at home.  Some rhinorrhea since last night, no ST or ear pain, but some tickle in the throat.  Tried OTC cough and cold meds w/o much help.    Meds, vitals, and allergies reviewed.   ROS: See HPI.  Otherwise, noncontributory.  GEN: nad, alert and oriented HEENT: mucous membranes moist, tm w/o erythema, nasal exam w/o erythema, clear discharge noted,  OP with cobblestoning, sinuses not ttp x4 NECK: supple w/o LA CV: rrr.   PULM: ctab, no inc wob EXT: no edema

## 2014-10-06 NOTE — Assessment & Plan Note (Signed)
Ctab, likely with UAN not a wheeze, still okay for outpatient f/u.  D/w pt.  Likely viral.  Supportive care.  Use tessalon prn.  She agrees.  F/u prn.

## 2014-10-06 NOTE — Patient Instructions (Signed)
Your lungs are clear.  Try tessalon for the cough.  Try to get some rest.  Drink plenty of fluids.  Update [place] as needed.

## 2014-10-06 NOTE — Assessment & Plan Note (Signed)
Improved, she is working through the divorce proceedings.  Continue 10mg  lexapro a day.  Safe at home.

## 2014-10-27 ENCOUNTER — Other Ambulatory Visit: Payer: Self-pay | Admitting: Family Medicine

## 2014-10-27 NOTE — Telephone Encounter (Signed)
Received refill request electronically from pharmacy. Last refill 08/26/14 #30/1, last office visit 10/06/14/acute. Is it okay to refill medication?

## 2014-10-27 NOTE — Telephone Encounter (Signed)
Sent. Thanks.   

## 2015-02-23 ENCOUNTER — Telehealth: Payer: Self-pay | Admitting: Family Medicine

## 2015-02-23 NOTE — Telephone Encounter (Signed)
Called patient to see if her symptoms resolved over the weekend, or if she needed to make an appointment to come in today.  Left patient a voicemail.  PLEASE NOTE: All timestamps contained within this report are represented as [place] Standard Time. CONFIDENTIALTY NOTICE: This fax transmission is intended only for the addressee. It contains information that is legally privileged, confidential or otherwise protected from use or disclosure. If you are not the intended recipient, you are strictly prohibited from reviewing, disclosing, copying using or disseminating any of this information or taking any action in reliance on or regarding this information. If you have received this fax in error, please notify us immediately by telephone so that we can arrange for its return to [place]. 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 She has to urinate frequently, PreDisposition Go to Urgent Care/Walk-In Clinic Nurse Assessment Nurse: Terance Hart, RN, Doug Date/Time Eilene Ghazi Time): 02/20/2015 11:34:12 PM Confirm and document reason for call. If symptomatic, describe symptoms. ---caller states that she had UTI many years ago . Today , she is having to go more frequently . Has the patient traveled out of the country within the  last 30 days? ---No Does the patient have any new or worsening symptoms? ---Yes Will a triage be completed? ---Yes Related visit to physician within the last 2 weeks? ---No Does the PT have any chronic conditions? (i.e. diabetes, asthma, etc.) ---No Did the patient indicate they were pregnant? ---No Guidelines Guideline Title Affirmed Question Affirmed Notes Nurse Date/Time (Eastern Time) Urinary Symptoms Dribbling (losing urine) just after finishing urination (i.e., post-void dribbling) Terance Hart, RN, Doug 02/20/2015 11:39:21 PM Disp. Time Eilene Ghazi Time) Disposition Final User 02/20/2015 11:47:56 PM See PCP within 2 Weeks Yes Terance Hart, RN, Adora Fridge Understands: Yes Disagree/Comply: Comply PLEASE NOTE: All timestamps contained within this report are represented as [place] Standard Time. CONFIDENTIALTY NOTICE: This fax transmission is intended only for the addressee. It contains information that is legally privileged, confidential or otherwise protected from use or disclosure. If you are not the intended recipient, you are strictly prohibited from reviewing, disclosing, copying using or disseminating any of this information or taking any action in reliance on or regarding this information. If you have received this fax in error, please notify us immediately by telephone so that we can arrange for its return to [place]. Phone: [phone], Toll-Free: [phone], Fax: [phone] Page: 2 of 2 Call Id: [id number] Care Advice Given Per Guideline CALL BACK IF: * Fever occurs * Unable to urinate and bladder feels full * You become worse. CARE ADVICE given per Urinary Symptoms (Adult) guideline. After Care Instructions Given Call Event Type User Date / Time Description

## 2015-02-23 NOTE — Telephone Encounter (Signed)
Please get update on patient

## 2015-02-23 NOTE — Telephone Encounter (Signed)
Left message on voice mail  to call back

## 2015-02-24 ENCOUNTER — Ambulatory Visit: Payer: BLUE CROSS/BLUE SHIELD | Admitting: Family Medicine

## 2015-02-24 NOTE — Telephone Encounter (Signed)
Patient scheduled for evaluation of symptoms this afternoon with Dr. Danise Mina.

## 2015-02-24 NOTE — Telephone Encounter (Addendum)
Pt is not on my schedule - looks like she scheduled for tomorrow with PCP.

## 2015-02-25 ENCOUNTER — Encounter: Payer: Self-pay | Admitting: Family Medicine

## 2015-02-25 ENCOUNTER — Ambulatory Visit (INDEPENDENT_AMBULATORY_CARE_PROVIDER_SITE_OTHER): Payer: BLUE CROSS/BLUE SHIELD | Admitting: Family Medicine

## 2015-02-25 VITALS — BP 100/62 | HR 70 | Temp 98.8°F | Wt 165.5 lb

## 2015-02-25 DIAGNOSIS — N3001 Acute cystitis with hematuria: Secondary | ICD-10-CM | POA: Diagnosis not present

## 2015-02-25 DIAGNOSIS — R350 Frequency of micturition: Secondary | ICD-10-CM | POA: Diagnosis not present

## 2015-02-25 LAB — POCT URINALYSIS DIPSTICK
Bilirubin, UA: NEGATIVE
GLUCOSE UA: NEGATIVE
Ketones, UA: NEGATIVE
NITRITE UA: NEGATIVE
Spec Grav, UA: 1.025
UROBILINOGEN UA: 0.2
pH, UA: 6

## 2015-02-25 MED ORDER — SULFAMETHOXAZOLE-TRIMETHOPRIM 800-160 MG PO TABS: 1.0000 | ORAL_TABLET | Freq: Two times a day (BID) | ORAL | Status: DC

## 2015-02-25 MED ORDER — ESCITALOPRAM OXALATE 10 MG PO TABS: 10.0000 mg | ORAL_TABLET | Freq: Every day | ORAL | Status: DC

## 2015-02-25 NOTE — Patient Instructions (Signed)
Drink plenty of water and start the antibiotics today.  We'll contact you with your lab report.  Take care.   

## 2015-02-25 NOTE — Progress Notes (Signed)
Pre visit review using our clinic review tool, if applicable. No additional management support is needed unless otherwise documented below in the visit note.  Mood is improved and she wanted to go back to 10mg  tabs of lexapro.  rx printed and given to patient.  She has tolerated lexapro w/o ADE.   Dysuria: yes, urgency and frequency but not burning duration of symptoms: a few days abdominal pain: no  fevers:no back pain:no Vomiting:no No discharge now.  Was prev treated for BV.    Meds, vitals, and allergies reviewed.   ROS: See HPI.  Otherwise negative.    GEN: nad, alert and oriented HEENT: mucous membranes moist NECK: supple CV: rrr.  PULM: ctab, no inc wob ABD: soft, +bs, suprapubic area not tender EXT: no edema SKIN: no acute rash BACK: no CVA pain

## 2015-02-25 NOTE — Telephone Encounter (Signed)
Will see today. Thanks

## 2015-02-26 DIAGNOSIS — N390 Urinary tract infection, site not specified: Secondary | ICD-10-CM | POA: Insufficient documentation

## 2015-02-26 NOTE — Assessment & Plan Note (Signed)
Nontoxic, ucx, septra, fluids, f/u prn. She agrees.

## 2015-02-27 LAB — URINE CULTURE: Colony Count: >=100000

## 2015-03-05 ENCOUNTER — Ambulatory Visit (INDEPENDENT_AMBULATORY_CARE_PROVIDER_SITE_OTHER): Payer: BLUE CROSS/BLUE SHIELD | Admitting: Family Medicine

## 2015-03-05 ENCOUNTER — Encounter: Payer: Self-pay | Admitting: Family Medicine

## 2015-03-05 VITALS — BP 104/62 | HR 64 | Temp 98.1°F | Wt 168.0 lb

## 2015-03-05 DIAGNOSIS — T148 Other injury of unspecified body region: Secondary | ICD-10-CM

## 2015-03-05 DIAGNOSIS — T148XXA Other injury of unspecified body region, initial encounter: Secondary | ICD-10-CM

## 2015-03-05 NOTE — Patient Instructions (Signed)
Take care. Glad to see you.  You should gradually heal up.  The road rash doesn't look infected.   Soap and water, no peroxide.
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
## 2015-03-06 DIAGNOSIS — T148XXA Other injury of unspecified body region, initial encounter: Secondary | ICD-10-CM | POA: Insufficient documentation

## 2015-03-06 NOTE — Assessment & Plan Note (Signed)
Superficial, no further treatment needed other than keeping it clean.  Bruising should resolve.  No sx of concussion.

## 2015-03-20 ENCOUNTER — Telehealth: Payer: Self-pay | Admitting: Family Medicine

## 2015-03-20 NOTE — Telephone Encounter (Signed)
[facility] Patient Name: LOIE JAHR DOB: January 24, 1971 Initial Comment Caller States she has stuffy, runny nose, chills/hot, aches, cough, had fever yesterday 101 Nurse Assessment Nurse: Markus Daft, RN, [place] Date/Time (Eastern Time): 03/20/2015 9:59:58 AM Confirm and document reason for call. If symptomatic, describe symptoms. ---Caller states that she has stuffy, runny nose, chills/hot, body aches, cough, had fever of 101 yesterday afternoon (no fever today). Feeling a little better today. -- S/S started Monday night with sore throat and then woke up Tuesday with stuffy nose. Then worse on Wednesday. Has the patient traveled out of the country within the last 30 days? ---No Does the patient have any new or worsening symptoms? ---Yes Will a triage be completed? ---Yes Related visit to physician within the last 2 weeks? ---No Does the PT have any chronic conditions? (i.e. diabetes, asthma, etc.) ---No Did the patient indicate they were pregnant? ---No Guidelines Guideline Title Affirmed Question Affirmed Notes Sinus Pain or Congestion [1] Sinus congestion as part of a cold AND [2] present < 10 days (all triage questions negative) Final Disposition User [place], RN, [place] Disagree/Comply: Comply

## 2015-03-20 NOTE — Telephone Encounter (Signed)
PLEASE NOTE: All timestamps contained within this report are represented as [place] Standard Time. CONFIDENTIALTY NOTICE: This fax transmission is intended only for the addressee. It contains information that is legally privileged, confidential or otherwise protected from use or disclosure. If you are not the intended recipient, you are strictly prohibited from reviewing, disclosing, copying using or disseminating any of this information or taking any action in reliance on or regarding this information. If you have received this fax in error, please notify us immediately by telephone so that we can arrange for its return to [place]. Phone: [phone], Toll-Free: [phone], Fax: [phone] Page: 1 of 2 Call Id: [id number] [place] Patient Name: Christy Ferrell Gender: Female DOB: Apr 16, 1971 Age: 44 Y 46 M 17 D Return Phone Number: [phone] (Primary), [phone] (Secondary) Address: City/State/Zip: [place] [address] Client [place] [facility] - Client Client Site [place] - Day Physician Renford Dills Contact Type Call Call Type Triage / Clinical Relationship To Patient Self Appointment Disposition EMR Appointment Not Necessary Info pasted into Epic Yes Return Phone Number [phone] (Primary) Chief Complaint Cold Symptom Initial Comment Caller States she has stuffy, runny nose, chills/hot, aches, cough, had fever yesterday 101 PreDisposition Call a family member Nurse Assessment Nurse: Markus Daft, RN, Sherre Poot Date/Time (Eastern Time): 03/20/2015 9:59:58 AM Confirm and document reason for call. If symptomatic, describe symptoms. ---Caller states that she has stuffy, runny nose, chills/hot, body aches, cough, had fever of 101 yesterday afternoon (no fever today). Feeling a little better today. -- S/S started Monday night with sore throat and then woke  up Tuesday with stuffy nose. Then worse on Wednesday. Has the patient traveled out of the country within the last 30 days? ---No Does the patient have any new or worsening symptoms? ---Yes Will a triage be completed? ---Yes Related visit to physician within the last 2 weeks? ---No Does the PT have any chronic conditions? (i.e. diabetes, asthma, etc.) ---No Did the patient indicate they were pregnant? ---No Guidelines Guideline Title Affirmed Question Affirmed Notes Nurse Date/Time Eilene Ghazi Time) Sinus Pain or Congestion [1] Sinus congestion as part of a cold AND [2] present < 10 days (all triage questions negative) Markus Daft, RN, Sherre Poot 03/20/2015 10:03:27 AM Disp. Time Eilene Ghazi Time) Disposition Final User 03/20/2015 9:47:53 AM Attempt made - message left Jackqulyn Livings 03/20/2015 9:48:42 AM Attempt made - message left Jackqulyn Livings 03/20/2015 10:09:15 AM [place], RN, Sherre Poot PLEASE NOTE: All timestamps contained within this report are represented as [place] Standard Time. CONFIDENTIALTY NOTICE: This fax transmission is intended only for the addressee. It contains information that is legally privileged, confidential or otherwise protected from use or disclosure. If you are not the intended recipient, you are strictly prohibited from reviewing, disclosing, copying using or disseminating any of this information or taking any action in reliance on or regarding this information. If you have received this fax in error, please notify us immediately by telephone so that we can arrange for its return to [place]. Phone: [phone], Toll-Free: [phone], Fax: [phone] Page: 2 of 2 Call Id: [id number] Caller Understands: Yes Disagree/Comply: Comply Care Advice Given Per Guideline HOME CARE: You should be able to treat this at home. REASSURANCE: * Sinus congestion is a normal part of a cold. * Usually home treatment with nasal washes can prevent an actual bacterial sinus  infection. FOR A STUFFY NOSE - USE NASAL WASHES: * Introduction:  Saline (salt water) nasal irrigation (nasal wash) is an effective and simple home remedy for treating stuffy nose and sinus congestion. The nose can be irrigated by pouring, spraying, or squirting salt water into the nose and then letting it run back out. * How it Helps: The salt water rinses out excess mucus, washes out any irritants (dust, allergens) that might be present, and moistens the nasal cavity. * Methods: There are several ways to perform nasal irrigation. You can use a saline nasal spray bottle (available over-the-counter), a rubber ear syringe, a medical syringe without the needle, or a NETI POT. NASAL DECONGESTANTS FOR A VERY STUFFY NOSE: * If you have a very stuffy nose, nasal decongestant medicines can shrink the swollen nasal mucosa and allow for easier breathing. If you have a very runny nose, these medicines can reduce the amount of drainage. They may be taken as pills by mouth or as a nasal spray. * Most people do NOT need to use these medicines. If your nose feels blocked, you should try using nasal washes first. * PSEUDOEPHEDRINE (Sudafed) is available OTC in pill form. Typical adult dosage is two 30 mg tablets every 6 hours. CAUTION - NASAL DECONGESTANTS: * Do not use these medications for more than 3 days (Reason: rebound nasal congestion). PAIN MEDICINES: * For pain relief, take acetaminophen, ibuprofen, or naproxen. * Before taking any medicine, read all the instructions on the package. EXPECTED COURSE: * Sinus congestion from viral upper respiratory infections (colds) usually lasts 5-10 days. * Occasionally a cold can worsen and turn into bacterial sinusitis. Clues to this are sinus symptoms lasting longer than 10 days, fever lasting longer than 3 days and worsening pain. Bacterial sinusitis may need antibiotic treatment. CALL BACK IF: * Severe pain persists over 2 hours after pain medicine * Sinus pain  persists over 1 day after using nasal washes * Sinus congestion (fullness) persists over 10 days * Fever lasts over 3 days * You become worse. CARE ADVICE given per Sinus Pain or Congestion (Adult) guideline. After Care Instructions Given Call Event Type User Date / Time Description

## 2015-03-20 NOTE — Telephone Encounter (Signed)
Noted. Thanks.  Per note, feeling a little better today.

## 2015-04-20 ENCOUNTER — Telehealth: Payer: Self-pay

## 2015-04-20 MED ORDER — ESCITALOPRAM OXALATE 10 MG PO TABS: 10.0000 mg | ORAL_TABLET | Freq: Every day | ORAL | Status: DC

## 2015-04-20 NOTE — Telephone Encounter (Signed)
Pt left v/m and cannot find rx lexapro 10 mg that was written on 02/25/15. Pt request lexapro 10 mg called to [facility].Please advise.unable to reach pt by phone.

## 2015-04-20 NOTE — Telephone Encounter (Signed)
Sent. Thanks.   

## 2016-06-06 ENCOUNTER — Other Ambulatory Visit: Payer: Self-pay | Admitting: Family Medicine

## 2016-06-06 NOTE — Telephone Encounter (Signed)
Last office visit 03/05/2015.  No future appointments.  Refill?

## 2016-06-06 NOTE — Telephone Encounter (Signed)
Sent.  Schedule f/u when possible.  Thanks.  

## 2016-06-07 NOTE — Telephone Encounter (Signed)
Left message on voicemail for patient to call back. 

## 2016-06-08 ENCOUNTER — Ambulatory Visit (INDEPENDENT_AMBULATORY_CARE_PROVIDER_SITE_OTHER): Payer: BLUE CROSS/BLUE SHIELD | Admitting: Family Medicine

## 2016-06-08 ENCOUNTER — Encounter: Payer: Self-pay | Admitting: Family Medicine

## 2016-06-08 DIAGNOSIS — J392 Other diseases of pharynx: Secondary | ICD-10-CM | POA: Diagnosis not present

## 2016-06-08 NOTE — Progress Notes (Signed)
Pre visit review using our clinic review tool, if applicable. No additional management support is needed unless otherwise documented below in the visit note. 

## 2016-06-08 NOTE — Progress Notes (Signed)
D/w pt about scheduling f/u in the spring or summer.  She agreed.    "its not really a cough" but more throat irritation. Throat feels dry and irritated.  Gas heat at home.  Some better in the last few days (warmer and rain recently).  Not painful but not fully back to baseline.  Not coughing, but rare throat clearing.  No FCNAVD.  No heartburn.  No ear pain.  No rhinorrhea, no facial pain.  No wheeze.  She snores, per patient report.  She doesn't wake up gasping for air.  No known h/o apnea.  She had a mild antecedent head cold.    She has changed her air filters recently.  Has cat and dog at home, not a new change, neither one is going anywhere.    Meds, vitals, and allergies reviewed.   ROS: Per HPI unless specifically indicated in ROS section   GEN: nad, alert and oriented HEENT: mucous membranes moist, minimal nasal irritation, sinuses not ttp, tm wnl NECK: supple w/o LA CV: rrr.  PULM: ctab, no inc wob ABD: soft, +bs EXT: no edema

## 2016-06-08 NOTE — Telephone Encounter (Signed)
Patient notified as instructed at office visit today.

## 2016-06-08 NOTE — Patient Instructions (Addendum)
Presumed post infectious upper airway irritation that should resolve in the next few weeks.   Rest your voice and gargle with salt water.  Other issues (potentially)- reflux, post nasal drip, snoring.  Update me if not better.  Take care.  Glad to see you.

## 2016-06-09 DIAGNOSIS — J392 Other diseases of pharynx: Secondary | ICD-10-CM | POA: Insufficient documentation

## 2016-06-09 NOTE — Assessment & Plan Note (Signed)
Presumed post infectious upper airway irritation that should resolve in the next few weeks.   Rest voice and gargle with salt water.  Other issues (potentially)- reflux, post nasal drip, snoring- d/w pt.   Update me if not better.  She agrees.

## 2016-09-13 ENCOUNTER — Ambulatory Visit (INDEPENDENT_AMBULATORY_CARE_PROVIDER_SITE_OTHER): Payer: BLUE CROSS/BLUE SHIELD | Admitting: Family Medicine

## 2016-09-13 ENCOUNTER — Encounter: Payer: Self-pay | Admitting: Family Medicine

## 2016-09-13 VITALS — BP 118/84 | HR 71 | Temp 98.2°F | Ht 63.0 in | Wt 175.5 lb

## 2016-09-13 DIAGNOSIS — R946 Abnormal results of thyroid function studies: Secondary | ICD-10-CM

## 2016-09-13 DIAGNOSIS — Z131 Encounter for screening for diabetes mellitus: Secondary | ICD-10-CM | POA: Diagnosis not present

## 2016-09-13 DIAGNOSIS — B001 Herpesviral vesicular dermatitis: Secondary | ICD-10-CM

## 2016-09-13 DIAGNOSIS — F419 Anxiety disorder, unspecified: Secondary | ICD-10-CM

## 2016-09-13 DIAGNOSIS — R739 Hyperglycemia, unspecified: Secondary | ICD-10-CM

## 2016-09-13 DIAGNOSIS — Z0000 Encounter for general adult medical examination without abnormal findings: Secondary | ICD-10-CM | POA: Diagnosis not present

## 2016-09-13 DIAGNOSIS — R7989 Other specified abnormal findings of blood chemistry: Secondary | ICD-10-CM

## 2016-09-13 LAB — GLUCOSE, RANDOM: GLUCOSE: 130 mg/dL — AB (ref 70–99)

## 2016-09-13 LAB — TSH: TSH: 4.46 u[IU]/mL (ref 0.35–4.50)

## 2016-09-13 MED ORDER — ESCITALOPRAM OXALATE 10 MG PO TABS: 10.0000 mg | ORAL_TABLET | Freq: Every day | ORAL | 3 refills | Status: DC

## 2016-09-13 MED ORDER — VALACYCLOVIR HCL 1 G PO TABS: 2000.0000 mg | ORAL_TABLET | Freq: Two times a day (BID) | ORAL | 3 refills | Status: DC

## 2016-09-13 MED ORDER — PENCICLOVIR 1 % EX CREA: 1.0000 "application " | TOPICAL_CREAM | CUTANEOUS | 3 refills | Status: DC

## 2016-09-13 NOTE — Progress Notes (Signed)
CPE- See plan.  Routine anticipatory guidance given to patient.  See health maintenance.  The possibility exists that previously documented standard health maintenance information may have been brought forward from a previous encounter into this note.  If needed, that same information has been updated to reflect the current situation based on today's encounter.    Tetanus 2012 Flu encouraged.   PNA and shingles d/w pt.  Not due. Diet and exercise d/w pt.   Colon cancer screening d/w pt.  Not yet due.  Pap per gyn. I'll defer.   DXA not due.  Mammogram d/w pt.  Per gyn.   HCV screening d/w pt, not due.   HIV screening d/w pt. Prev done with prenatal care.   Living will d/w pt.  Would have her mother designated if patient were incapacitated.   Sugar screening d/w pt.  Lipids prev at goal.    She had gotten fever blisters/cold sores prev and needed med to have in case.   h/o postpartum thyroid changes.  Due for routine TSH check.  D/w pt.  No neck mass, no dysphagia.    Anxiety.  She is worried about her son, but he is doing some better in the meantime.  No ADE on lexapro.  She wanted to continue med.  No SI/HI.    PMH and SH reviewed  Meds, vitals, and allergies reviewed.   ROS: Per HPI.  Unless specifically indicated otherwise in HPI, the patient denies:  General: fever. Eyes: acute vision changes ENT: sore throat Cardiovascular: chest pain Respiratory: SOB GI: vomiting GU: dysuria Musculoskeletal: acute back pain Derm: acute rash Neuro: acute motor dysfunction Psych: worsening mood Endocrine: polydipsia Heme: bleeding Allergy: hayfever  GEN: nad, alert and oriented HEENT: mucous membranes moist NECK: supple w/o LA CV: rrr. PULM: ctab, no inc wob ABD: soft, +bs EXT: no edema SKIN: no acute rash

## 2016-09-13 NOTE — Patient Instructions (Signed)
Go to the lab on the way out.  We'll contact you with your lab report. Take care.  Glad to see you.  Update me as needed.   

## 2016-09-13 NOTE — Progress Notes (Signed)
Pre visit review using our clinic review tool, if applicable. No additional management support is needed unless otherwise documented below in the visit note. 

## 2016-09-14 ENCOUNTER — Encounter: Payer: Self-pay | Admitting: Family Medicine

## 2016-09-14 ENCOUNTER — Telehealth: Payer: Self-pay | Admitting: Family Medicine

## 2016-09-14 DIAGNOSIS — B001 Herpesviral vesicular dermatitis: Secondary | ICD-10-CM | POA: Insufficient documentation

## 2016-09-14 DIAGNOSIS — Z7189 Other specified counseling: Secondary | ICD-10-CM | POA: Insufficient documentation

## 2016-09-14 DIAGNOSIS — R739 Hyperglycemia, unspecified: Secondary | ICD-10-CM | POA: Insufficient documentation

## 2016-09-14 DIAGNOSIS — R7989 Other specified abnormal findings of blood chemistry: Secondary | ICD-10-CM | POA: Insufficient documentation

## 2016-09-14 NOTE — Assessment & Plan Note (Signed)
She had gotten fever blisters/cold sores prev and needed med to have in case, see orders.

## 2016-09-14 NOTE — Assessment & Plan Note (Signed)
Tetanus 2012 Flu encouraged.   PNA and shingles d/w pt.  Not due. Diet and exercise d/w pt.   Colon cancer screening d/w pt.  Not yet due.  Pap per gyn. I'll defer.   DXA not due.  Mammogram d/w pt.  Per gyn.   HCV screening d/w pt, not due.   HIV screening d/w pt. Prev done with prenatal care.   Living will d/w pt.  Would have her mother designated if patient were incapacitated.   Sugar screening d/w pt.  Lipids prev at goal.

## 2016-09-14 NOTE — Assessment & Plan Note (Signed)
See notes on labs. 

## 2016-09-14 NOTE — Telephone Encounter (Signed)
Patient returned call about her lab work. °

## 2016-09-14 NOTE — Assessment & Plan Note (Signed)
Continue as is with SSRI.  Sx improved with med.  Okay for outpatient f/u.

## 2016-09-14 NOTE — Telephone Encounter (Signed)
Spoke with pt, please refer to lab notes, thanks.

## 2016-09-15 ENCOUNTER — Other Ambulatory Visit (INDEPENDENT_AMBULATORY_CARE_PROVIDER_SITE_OTHER): Payer: BLUE CROSS/BLUE SHIELD

## 2016-09-15 DIAGNOSIS — R739 Hyperglycemia, unspecified: Secondary | ICD-10-CM | POA: Diagnosis not present

## 2016-09-15 LAB — HEMOGLOBIN A1C: HEMOGLOBIN A1C: 6.4 % (ref 4.6–6.5)

## 2016-09-18 ENCOUNTER — Other Ambulatory Visit: Payer: Self-pay | Admitting: Family Medicine

## 2016-09-18 DIAGNOSIS — R739 Hyperglycemia, unspecified: Secondary | ICD-10-CM

## 2016-09-19 ENCOUNTER — Telehealth: Payer: Self-pay | Admitting: *Deleted

## 2016-09-20 ENCOUNTER — Encounter: Payer: Self-pay | Admitting: *Deleted

## 2016-09-22 NOTE — Telephone Encounter (Signed)
eRRONEOUS ENCOUNTER

## 2016-09-23 ENCOUNTER — Telehealth: Payer: Self-pay

## 2016-09-23 NOTE — Telephone Encounter (Signed)
Ok for PA?

## 2016-09-23 NOTE — Telephone Encounter (Signed)
Christy Ferrell with [facility] left v/m requesting prior auth for Denavir cream.

## 2016-09-25 MED ORDER — ACYCLOVIR 5 % EX OINT: 1.0000 "application " | TOPICAL_OINTMENT | CUTANEOUS | 1 refills | Status: DC

## 2016-09-25 NOTE — Telephone Encounter (Signed)
I thought this was already address.  I don't think they'll cover it. Okay to change to a different topical.  rx changed, sent.  Thanks.

## 2017-01-04 ENCOUNTER — Encounter (INDEPENDENT_AMBULATORY_CARE_PROVIDER_SITE_OTHER): Payer: Self-pay

## 2017-01-04 ENCOUNTER — Ambulatory Visit (INDEPENDENT_AMBULATORY_CARE_PROVIDER_SITE_OTHER): Payer: BLUE CROSS/BLUE SHIELD | Admitting: Family Medicine

## 2017-01-04 ENCOUNTER — Encounter: Payer: Self-pay | Admitting: Family Medicine

## 2017-01-04 DIAGNOSIS — R059 Cough, unspecified: Secondary | ICD-10-CM

## 2017-01-04 DIAGNOSIS — R05 Cough: Secondary | ICD-10-CM | POA: Diagnosis not present

## 2017-01-04 MED ORDER — ALBUTEROL SULFATE HFA 108 (90 BASE) MCG/ACT IN AERS: 1.0000 | INHALATION_SPRAY | Freq: Four times a day (QID) | RESPIRATORY_TRACT | 0 refills | Status: DC | PRN

## 2017-01-04 MED ORDER — BENZONATATE 200 MG PO CAPS: 200.0000 mg | ORAL_CAPSULE | Freq: Three times a day (TID) | ORAL | 1 refills | Status: DC | PRN

## 2017-01-04 NOTE — Patient Instructions (Signed)
Tessalon for cough, then albuterol if needed.  Update me as needed.  Likely a post infectious cough that should gradually get better in the next 2 months.   If you are getting worse then let me know.  Take care.  Glad to see you.

## 2017-01-04 NOTE — Progress Notes (Signed)
duration of symptoms: 2 weeks.   All sx better except for the cough.   She prev had fatigue, rhinorrhea, congestion, HA, aches, sneezing, fever up to ~100-101.   Dry cough continues.  No sputum.  She doesn't feel sick except for the continued cough.   No wheeze usually.    Per HPI unless specifically indicated in ROS section   Meds, vitals, and allergies reviewed.   GEN: nad, alert and oriented HEENT: mucous membranes moist, TM w/o erythema, nasal epithelium injected, OP with cobblestoning NECK: supple w/o LA CV: rrr. PULM: ctab, no inc wob, cough noted.   ABD: soft, +bs EXT: no edema

## 2017-01-05 NOTE — Assessment & Plan Note (Addendum)
Likely a post infectious cough that should gradually get better in the next 2 months.    Tessalon for cough, then albuterol if needed.  Routine cautions given on both meds. Routine instructions given for both. Update me as needed.  If getting worse then she'll let me know.  Nontoxic. Okay for outpatient follow-up.

## 2017-01-31 ENCOUNTER — Other Ambulatory Visit: Payer: Self-pay | Admitting: Family Medicine

## 2017-07-03 ENCOUNTER — Ambulatory Visit: Payer: BLUE CROSS/BLUE SHIELD | Admitting: Family Medicine

## 2017-07-03 ENCOUNTER — Encounter: Payer: Self-pay | Admitting: Family Medicine

## 2017-07-03 DIAGNOSIS — M25519 Pain in unspecified shoulder: Secondary | ICD-10-CM

## 2017-07-03 MED ORDER — VALACYCLOVIR HCL 1 G PO TABS
2000.0000 mg | ORAL_TABLET | Freq: Two times a day (BID) | ORAL | Status: DC | PRN
Start: 2017-07-03 — End: 2018-08-02

## 2017-07-03 NOTE — Patient Instructions (Signed)
Take 2-3 ibuprofen with food 2-3 times a day.   Use the exercises and if not improved I want you to see Dr. Lorelei Pont.   Take care.
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
## 2017-07-05 NOTE — Assessment & Plan Note (Signed)
Likely rotator cuff strain.  Anatomy and handout discussed with the patient.  Does not need imaging at this point. Take 2-3 ibuprofen with food 2-3 times a day.  She can use the exercises on the handout and if not improved then I want her to see Dr. Lorelei Pont.  She agrees.

## 2017-09-07 ENCOUNTER — Other Ambulatory Visit: Payer: Self-pay

## 2017-09-07 ENCOUNTER — Encounter: Payer: Self-pay | Admitting: Family Medicine

## 2017-09-07 ENCOUNTER — Ambulatory Visit: Payer: BLUE CROSS/BLUE SHIELD | Admitting: Family Medicine

## 2017-09-07 VITALS — BP 123/83 | HR 88 | Temp 98.6°F | Ht 63.0 in | Wt 174.2 lb

## 2017-09-07 DIAGNOSIS — M7501 Adhesive capsulitis of right shoulder: Secondary | ICD-10-CM | POA: Diagnosis not present

## 2017-09-07 MED ORDER — METHYLPREDNISOLONE ACETATE 40 MG/ML IJ SUSP
80.0000 mg | Freq: Once | INTRAMUSCULAR | Status: AC
  Administered 2017-09-07: 80 mg via INTRA_ARTICULAR

## 2017-09-07 NOTE — Progress Notes (Signed)
Dr. Frederico Hamman T. Aubriee Szeto, MD, [facility] [place], [address] Phone: [phone] Fax: [phone]  09/07/2017  Patient: Christy Ferrell, MRN: [id number], DOB: [birth year], 47 y.o.  Primary Physician:  [name], MD   Chief Complaint  Patient presents with  . Shoulder Pain    Right   Subjective:   Christy Ferrell is a 47 y.o. very pleasant female patient who presents with the following: shoulder pain  The patient noted above presents with shoulder pain that has been ongoing for 4 mo. there is no history of trauma, but she does describe an incident around Christmas time where she was moving a box in a Christmas tree and had some pain in her right shoulder. The patient denies neck pain or radicular symptoms. No shoulder blade pain Denies dislocation, subluxation, separation of the shoulder. The patient does complain of pain with flexion, abduction, and terminal motion.  Significant restriction of motion. she describes a deep ache around the shoulder, and sometimes it will wake the patient up at night.  Hurt around christmas time. Dr. Damita Dunnings thought that maybe had strained her RTC cuff. Reinjured, and grabbed a hot pan, jerked back. Moving Christmas tree and box initially - now about 4 months out.   Medications Tried: Tylenol and NSAIDS Ice or Heat: minimal help Tried PT: No  Prior shoulder Injury: No Prior surgery: No Prior fracture: No  Past Medical History, Surgical History, Social History, Family History, Medications, and allergies reviewed and updated if relevant.   Patient Active Problem List   Diagnosis Date Noted  . Abnormal thyroid stimulating hormone (TSH) level 09/14/2016  . Hyperglycemia 09/14/2016  . Fever blister 09/14/2016  . Advance care planning 09/14/2016  . Cough 10/06/2014  . Headache(784.0) 06/05/2012  . Shoulder pain 01/18/2012  . Anxiety 02/18/2011  . Routine general medical  examination at a health care facility 09/03/2010    Past Medical History:  Diagnosis Date  . Anxiety    h/o, was prev on zoloft  . Hypothyroidism    postpartum hypothyroidism  . IUD (intrauterine device) in place    mirena per Daneil Dan    Past Surgical History:  Procedure Laterality Date  . CESAREAN SECTION     X 2 Fetal stress, second scheduled  . CHOLECYSTECTOMY  05/1998    Social History   Socioeconomic History  . Marital status: Married    Spouse name: Not on file  . Number of children: Not on file  . Years of education: Not on file  . Highest education level: Not on file  Occupational History  . Occupation: Housewife  Social Needs  . Financial resource strain: Not on file  . Food insecurity:    Worry: Not on file    Inability: Not on file  . Transportation needs:    Medical: Not on file    Non-medical: Not on file  Tobacco Use  . Smoking status: Never Smoker  . Smokeless tobacco: Never Used  Substance and Sexual Activity  . Alcohol use: Yes    Alcohol/week: 0.0 oz    Comment: Very rarely.  . Drug use: No  . Sexual activity: Not on file  Lifestyle  . Physical activity:    Days per week: Not on file    Minutes per session: Not on file  . Stress: Not on file  Relationships  . Social connections:    Talks on phone: Not on file  Gets together: Not on file    Attends religious service: Not on file    Active member of club or organization: Not on file    Attends meetings of clubs or organizations: Not on file    Relationship status: Not on file  . Intimate partner violence:    Fear of current or ex partner: Not on file    Emotionally abused: Not on file    Physically abused: Not on file    Forced sexual activity: Not on file  Other Topics Concern  . Not on file  Social History Narrative   Married 1997, divorced 2017   2 kids    Family History  Problem Relation Age of Onset  . Cancer Father        Prostate, S/P prostatectomy  .  Hypertension Father   . Depression Father   . Asthma Sister   . Hypothyroidism Mother   . Hypertension Maternal Grandmother   . Stroke Neg Hx   . Diabetes Neg Hx   . Breast cancer Neg Hx   . Colon cancer Neg Hx     Allergies  Allergen Reactions  . Codeine Nausea And Vomiting  . Zoloft [Sertraline Hcl]     headaches    Medication list reviewed and updated in full in [facility].  GEN: No fevers, chills. Nontoxic. Primarily MSK c/o today. MSK: Detailed in the HPI GI: tolerating PO intake without difficulty Neuro: No numbness, parasthesias, or tingling associated. Otherwise the pertinent positives of the ROS are noted above.    Objective:   Blood pressure 123/83, pulse 88, temperature 98.6 F (37 C), temperature source Oral, height 5\' 3"  (1.6 m), weight 174 lb 4 oz (79 kg).  GEN: WDWN, NAD, Non-toxic, Alert & Oriented x 3 HEENT: Atraumatic, Normocephalic.  Ears and Nose: No external deformity. EXTR: No clubbing/cyanosis/edema NEURO: Normal gait.  PSYCH: Normally interactive. Conversant. Not depressed or anxious appearing.  Calm demeanor.   Shoulder: R and L Inspection: No muscle wasting or winging Ecchymosis/edema: neg  AC joint, scapula, clavicle: NT Cervical spine: NT, full ROM Spurling's: neg ABNORMAL SIDE TESTED: R UNLESS OTHERWISE NOTED, THE CONTRALATERAL SIDE HAS FULL RANGE OF MOTION. Abduction: 5/5, LIMITED TO 135 DEGREES Flexion: 5/5, LIMITED TO 145 DEGNO ROM  IR, lift-off: 5/5. TESTED AT 90 DEGREES OF ABDUCTION, LIMITED TO 5 DEGREES ER at neutral:  5/5, TESTED AT 90 DEGREES OF ABDUCTION, LIMITED TO 35 DEGREES AC crossover and compression: PAIN Drop Test: neg Empty Can: neg Supraspinatus insertion: NT Bicipital groove: NT ALL OTHER SPECIAL TESTING EQUIVOCAL GIVEN LOSS OF MOTION C5-T1 intact Sensation intact Grip 5/5   Assessment and Plan:   Adhesive capsulitis of right shoulder - Plan: Ambulatory referral to Physical Therapy, methylPREDNISolone  acetate (DEPO-MEDROL) injection 80 mg  >25 minutes spent in face to face time with patient, >50% spent in counselling or coordination of care   Suspect preceding injury, rotator cuff strain, then secondary adhesive capsulitis.  Hopefully will resolve relatively quickly given this mechanism and patient age.  Patient was given a systematic ROM protocol from [facility]. Emphasized importance of adherence, help of PT, daily HEP.  The average length of total symptoms is 12-18 months going through 3 different phases in the freezing and thawing process. Reviewed all with patient.   Tylenol or NSAID of choice prn for pain relief Intraarticular shoulder injections discussed with patient, which have good evidence for accelerating the thawing phase.  Patient will be sent for formal PT for aggressive frozen  shoulder ROM. Will need RTC str and scapular stabilization to fix underlying mechanics.  Intrarticular Shoulder Injection, R Verbal consent was obtained from the patient. Risks including infection explained and contrasted with benefits and alternatives. Patient prepped with Chloraprep and Ethyl Chloride used for anesthesia. An intraarticular shoulder injection was performed using the posterior approach. The patient tolerated the procedure well and had decreased pain post injection. No complications. Injection: 8 cc of Lidocaine 1% and 2 mL Depo-Medrol 40 mg. Needle: 21 gauge, 2 inch  Follow-up: Return in about 2 months (around 11/07/2017).  Meds ordered this encounter  Medications  . methylPREDNISolone acetate (DEPO-MEDROL) injection 80 mg   Orders Placed This Encounter  Procedures  . Ambulatory referral to Physical Therapy    Signed,  Frederico Hamman T. Pryor Guettler, MD   Patient's Medications  New Prescriptions   No medications on file  Previous Medications   ACYCLOVIR OINTMENT (ZOVIRAX) 5 %    Apply 1 application topically every 3 (three) hours. As needed.   CYCLOBENZAPRINE (FLEXERIL) 10 MG TABLET     TAKE 1/2 TO 1 TABLET BY MOUTH EVERY DAY AS NEEDED   ESCITALOPRAM (LEXAPRO) 10 MG TABLET    Take 1 tablet (10 mg total) by mouth daily.   IBUPROFEN (ADVIL,MOTRIN) 200 MG TABLET    Take 200 mg by mouth every 6 (six) hours as needed.   VALACYCLOVIR (VALTREX) 1000 MG TABLET    Take 2 tablets (2,000 mg total) by mouth 2 (two) times daily as needed. For 1 day.  Modified Medications   No medications on file  Discontinued Medications   No medications on file

## 2017-09-07 NOTE — Patient Instructions (Signed)

## 2017-09-08 ENCOUNTER — Encounter: Payer: Self-pay | Admitting: Family Medicine

## 2017-10-04 ENCOUNTER — Other Ambulatory Visit: Payer: Self-pay | Admitting: Family Medicine

## 2017-10-04 NOTE — Telephone Encounter (Signed)
Electronic refill request Last office visit 09/07/17/acute Last refill 09/13/16 #90/3 See allergy/contraindication

## 2017-10-05 NOTE — Telephone Encounter (Signed)
Sent. Thanks.   

## 2017-11-13 ENCOUNTER — Ambulatory Visit: Payer: BLUE CROSS/BLUE SHIELD | Admitting: Family Medicine

## 2017-11-13 DIAGNOSIS — Z0289 Encounter for other administrative examinations: Secondary | ICD-10-CM

## 2018-04-18 ENCOUNTER — Telehealth: Payer: Self-pay | Admitting: Family Medicine

## 2018-04-18 NOTE — Telephone Encounter (Signed)
Pt need a letter stating that she had a rotator cuff issues but didn't need surgery and she has been cleared per Physical Therapy. Please call pt when ready so she can pick it up

## 2018-04-19 NOTE — Telephone Encounter (Signed)
Patient advised. Note left at front desk for pick up.  

## 2018-04-19 NOTE — Telephone Encounter (Signed)
Letter done. Thanks.

## 2018-08-02 ENCOUNTER — Encounter: Payer: Self-pay | Admitting: Family Medicine

## 2018-08-02 ENCOUNTER — Ambulatory Visit: Payer: BLUE CROSS/BLUE SHIELD | Admitting: Family Medicine

## 2018-08-02 ENCOUNTER — Other Ambulatory Visit: Payer: Self-pay

## 2018-08-02 DIAGNOSIS — J069 Acute upper respiratory infection, unspecified: Secondary | ICD-10-CM

## 2018-08-02 MED ORDER — AMOXICILLIN-POT CLAVULANATE 875-125 MG PO TABS: 1.0000 | ORAL_TABLET | Freq: Two times a day (BID) | ORAL | 0 refills | Status: DC

## 2018-08-02 MED ORDER — VALACYCLOVIR HCL 1 G PO TABS: 2000.0000 mg | ORAL_TABLET | Freq: Two times a day (BID) | ORAL | 1 refills | Status: DC | PRN

## 2018-08-02 MED ORDER — CYCLOBENZAPRINE HCL 10 MG PO TABS: ORAL_TABLET | ORAL | 1 refills | Status: DC

## 2018-08-02 NOTE — Progress Notes (Signed)
duration of symptoms: about 1 week, gradually worse in the meantime.   Rhinorrhea: yes, mainly post nasal gtt.  Discolored.   Congestion: some, intermittently, worse in early AM and late PM ear pain:no sore throat: no Cough:more throat clearing than coughing.  More in the early AM Myalgias:no Fevers: no other concerns: refills done on valtrex and flexeril, done at [facility].   Per HPI unless specifically indicated in ROS section  Meds, vitals, and allergies reviewed.   GEN: nad, alert and oriented HEENT: mucous membranes moist, TM w/o erythema, nasal epithelium injected, OP with cobblestoning NECK: supple w/o LA CV: rrr. PULM: ctab, no inc wob EXT: no edema Sinuses not ttp

## 2018-08-02 NOTE — Patient Instructions (Signed)
Rest and fluids, use flonase, and then start augmentin if you continue to get worse in the next few days.  Take care.  Glad to see you.

## 2018-08-05 DIAGNOSIS — J069 Acute upper respiratory infection, unspecified: Secondary | ICD-10-CM | POA: Insufficient documentation

## 2018-08-05 NOTE — Assessment & Plan Note (Signed)
Nontoxic.  Discussed with patient about options Rest and fluids, use flonase, and then start augmentin if she continues to get worse in the next few days.  She agrees.  Update [place] as needed.

## 2018-08-10 ENCOUNTER — Telehealth: Payer: BLUE CROSS/BLUE SHIELD | Admitting: Physician Assistant

## 2018-08-10 DIAGNOSIS — B373 Candidiasis of vulva and vagina: Secondary | ICD-10-CM | POA: Diagnosis not present

## 2018-08-10 DIAGNOSIS — B3731 Acute candidiasis of vulva and vagina: Secondary | ICD-10-CM

## 2018-08-10 NOTE — Progress Notes (Signed)
We are sorry that you are not feeling well. Here is how we plan to help! Based on what you shared with me it looks like you: May have a yeast vaginosis  Vaginosis is an inflammation of the vagina that can result in discharge, itching and pain. The cause is usually a change in the normal balance of vaginal bacteria or an infection. Vaginosis can also result from reduced estrogen levels after menopause.  The most common causes of vaginosis are:   Bacterial vaginosis which results from an overgrowth of one on several organisms that are normally present in your vagina.   Yeast infections which are caused by a naturally occurring fungus called candida.   Vaginal atrophy (atrophic vaginosis) which results from the thinning of the vagina from reduced estrogen levels after menopause.   Trichomoniasis which is caused by a parasite and is commonly transmitted by sexual intercourse.  Factors that increase your risk of developing vaginosis include: Marland Kitchen Medications, such as antibiotics and steroids . Uncontrolled diabetes . Use of hygiene products such as bubble bath, vaginal spray or vaginal deodorant . Douching . Wearing damp or tight-fitting clothing . Using an intrauterine device (IUD) for birth control . Hormonal changes, such as those associated with pregnancy, birth control pills or menopause . Sexual activity . Having a sexually transmitted infection  Your treatment plan is Monistat (miconazole) or Gyne-Lotrimin (clotrimazole) over the counter at most phamacies. I am recommending this treatment first as the typical oral treatment for yeast (Diflucan) can cause a significant heart arrhythmia when taken along with medications like your Lexapro. Although this is rare, we try to avoid using these together if at all possible.    Be sure to take all of the medication as directed. Stop taking any medication if you develop a rash, tongue swelling or shortness of breath. Mothers who are breast feeding  should consider pumping and discarding their breast milk while on these antibiotics. However, there is no consensus that infant exposure at these doses would be harmful.  Remember that medication creams can weaken latex condoms. Marland Kitchen   HOME CARE:  Good hygiene may prevent some types of vaginosis from recurring and may relieve some symptoms:  . Avoid baths, hot tubs and whirlpool spas. Rinse soap from your outer genital area after a shower, and dry the area well to prevent irritation. Don't use scented or harsh soaps, such as those with deodorant or antibacterial action. Marland Kitchen Avoid irritants. These include scented tampons and pads. . Wipe from front to back after using the toilet. Doing so avoids spreading fecal bacteria to your vagina.  Other things that may help prevent vaginosis include:  Marland Kitchen Don't douche. Your vagina doesn't require cleansing other than normal bathing. Repetitive douching disrupts the normal organisms that reside in the vagina and can actually increase your risk of vaginal infection. Douching won't clear up a vaginal infection. . Use a latex condom. Both female and female latex condoms may help you avoid infections spread by sexual contact. . Wear cotton underwear. Also wear pantyhose with a cotton crotch. If you feel comfortable without it, skip wearing underwear to bed. Yeast thrives in [employer] Your symptoms should improve in the next day or two.  GET HELP RIGHT AWAY IF:  . You have pain in your lower abdomen ( pelvic area or over your ovaries) . You develop nausea or vomiting . You develop a fever . Your discharge changes or worsens . You have persistent pain with intercourse . You develop  shortness of breath, a rapid pulse, or you faint.  These symptoms could be signs of problems or infections that need to be evaluated by a medical provider now.  MAKE SURE YOU    Understand these instructions.  Will watch your condition.  Will get help right away if you  are not doing well or get worse.  Your e-visit answers were reviewed by a board certified advanced clinical practitioner to complete your personal care plan. Depending upon the condition, your plan could have included both over the counter or prescription medications. Please review your pharmacy choice to make sure that you have choses a pharmacy that is open for you to pick up any needed prescription, Your safety is important to [place]. If you have drug allergies check your prescription carefully.   You can use MyChart to ask questions about today's visit, request a non-urgent call back, or ask for a work or school excuse for 24 hours related to this e-Visit. If it has been greater than 24 hours you will need to follow up with your provider, or enter a new e-Visit to address those concerns. You will get a MyChart message within the next two days asking about your experience. I hope that your e-visit has been valuable and will speed your recovery.

## 2018-08-10 NOTE — Progress Notes (Signed)
I have spent 5 minutes in review of e-visit questionnaire, review and updating patient chart, medical decision making and response to patient.   Teruo Stilley Cody [place] Paiz, PA-C    

## 2018-08-19 ENCOUNTER — Telehealth: Payer: BLUE CROSS/BLUE SHIELD | Admitting: Family

## 2018-08-19 DIAGNOSIS — B3731 Acute candidiasis of vulva and vagina: Secondary | ICD-10-CM

## 2018-08-19 DIAGNOSIS — B373 Candidiasis of vulva and vagina: Secondary | ICD-10-CM

## 2018-08-19 MED ORDER — FLUCONAZOLE 150 MG PO TABS: 150.0000 mg | ORAL_TABLET | ORAL | 0 refills | Status: DC | PRN

## 2018-08-19 NOTE — Progress Notes (Signed)
We are sorry that you are not feeling well. Here is how we plan to help! Based on what you shared with me it looks like you: May have a yeast vaginosis  Vaginosis is an inflammation of the vagina that can result in discharge, itching and pain. The cause is usually a change in the normal balance of vaginal bacteria or an infection. Vaginosis can also result from reduced estrogen levels after menopause.  The most common causes of vaginosis are:   Bacterial vaginosis which results from an overgrowth of one on several organisms that are normally present in your vagina.   Yeast infections which are caused by a naturally occurring fungus called candida.   Vaginal atrophy (atrophic vaginosis) which results from the thinning of the vagina from reduced estrogen levels after menopause.   Trichomoniasis which is caused by a parasite and is commonly transmitted by sexual intercourse.  Factors that increase your risk of developing vaginosis include: Marland Kitchen Medications, such as antibiotics and steroids . Uncontrolled diabetes . Use of hygiene products such as bubble bath, vaginal spray or vaginal deodorant . Douching . Wearing damp or tight-fitting clothing . Using an intrauterine device (IUD) for birth control . Hormonal changes, such as those associated with pregnancy, birth control pills or menopause . Sexual activity . Having a sexually transmitted infection  Your treatment plan is A single Diflucan (fluconazole) 150mg  tablet once.  I have electronically sent this prescription into the pharmacy that you have chosen.    After reviewing the chart, it does not look like she received any diflucan from previous Evisit. So I will go ahead. According, to her statement she has only been using OTC with no success.   Be sure to take all of the medication as directed. Stop taking any medication if you develop a rash, tongue swelling or shortness of breath. Mothers who are breast feeding should consider pumping  and discarding their breast milk while on these antibiotics. However, there is no consensus that infant exposure at these doses would be harmful.  Remember that medication creams can weaken latex condoms. Marland Kitchen   HOME CARE:  Good hygiene may prevent some types of vaginosis from recurring and may relieve some symptoms:  . Avoid baths, hot tubs and whirlpool spas. Rinse soap from your outer genital area after a shower, and dry the area well to prevent irritation. Don't use scented or harsh soaps, such as those with deodorant or antibacterial action. Marland Kitchen Avoid irritants. These include scented tampons and pads. . Wipe from front to back after using the toilet. Doing so avoids spreading fecal bacteria to your vagina.  Other things that may help prevent vaginosis include:  Marland Kitchen Don't douche. Your vagina doesn't require cleansing other than normal bathing. Repetitive douching disrupts the normal organisms that reside in the vagina and can actually increase your risk of vaginal infection. Douching won't clear up a vaginal infection. . Use a latex condom. Both female and female latex condoms may help you avoid infections spread by sexual contact. . Wear cotton underwear. Also wear pantyhose with a cotton crotch. If you feel comfortable without it, skip wearing underwear to bed. Yeast thrives in [employer] Your symptoms should improve in the next day or two.  GET HELP RIGHT AWAY IF:  . You have pain in your lower abdomen ( pelvic area or over your ovaries) . You develop nausea or vomiting . You develop a fever . Your discharge changes or worsens . You have persistent pain with intercourse .  You develop shortness of breath, a rapid pulse, or you faint.  These symptoms could be signs of problems or infections that need to be evaluated by a medical provider now.  MAKE SURE YOU    Understand these instructions.  Will watch your condition.  Will get help right away if you are not doing well or  get worse.  Your e-visit answers were reviewed by a board certified advanced clinical practitioner to complete your personal care plan. Depending upon the condition, your plan could have included both over the counter or prescription medications. Please review your pharmacy choice to make sure that you have choses a pharmacy that is open for you to pick up any needed prescription, Your safety is important to [place]. If you have drug allergies check your prescription carefully.   You can use MyChart to ask questions about today's visit, request a non-urgent call back, or ask for a work or school excuse for 24 hours related to this e-Visit. If it has been greater than 24 hours you will need to follow up with your provider, or enter a new e-Visit to address those concerns. You will get a MyChart message within the next two days asking about your experience. I hope that your e-visit has been valuable and will speed your recovery.

## 2018-10-20 ENCOUNTER — Other Ambulatory Visit: Payer: Self-pay | Admitting: Family Medicine

## 2018-10-22 ENCOUNTER — Encounter: Payer: Self-pay | Admitting: *Deleted

## 2019-01-23 ENCOUNTER — Other Ambulatory Visit: Payer: Self-pay | Admitting: Family Medicine

## 2019-01-23 NOTE — Telephone Encounter (Signed)
Electronic refill request Lexapro Last office visit 07/23/18 acute Letter mailed to patient to call and scheduled CPE on 10/22/18 No upcoming appointment scheduled. See allergy/contraindication

## 2019-01-23 NOTE — Telephone Encounter (Signed)
Sent.  Thanks.  Note added to rx to schedule CPE.

## 2019-07-05 ENCOUNTER — Other Ambulatory Visit: Payer: Self-pay | Admitting: Family Medicine

## 2019-12-03 ENCOUNTER — Ambulatory Visit (INDEPENDENT_AMBULATORY_CARE_PROVIDER_SITE_OTHER): Payer: No Typology Code available for payment source | Admitting: Family Medicine

## 2019-12-03 ENCOUNTER — Other Ambulatory Visit: Payer: Self-pay

## 2019-12-03 ENCOUNTER — Encounter: Payer: Self-pay | Admitting: Family Medicine

## 2019-12-03 VITALS — BP 110/76 | HR 73 | Temp 97.3°F | Ht 63.0 in | Wt 174.3 lb

## 2019-12-03 DIAGNOSIS — Z0000 Encounter for general adult medical examination without abnormal findings: Secondary | ICD-10-CM | POA: Diagnosis not present

## 2019-12-03 DIAGNOSIS — E039 Hypothyroidism, unspecified: Secondary | ICD-10-CM | POA: Diagnosis not present

## 2019-12-03 DIAGNOSIS — R739 Hyperglycemia, unspecified: Secondary | ICD-10-CM | POA: Diagnosis not present

## 2019-12-03 DIAGNOSIS — F419 Anxiety disorder, unspecified: Secondary | ICD-10-CM

## 2019-12-03 DIAGNOSIS — Z13220 Encounter for screening for lipoid disorders: Secondary | ICD-10-CM

## 2019-12-03 DIAGNOSIS — M25519 Pain in unspecified shoulder: Secondary | ICD-10-CM

## 2019-12-03 DIAGNOSIS — Z7189 Other specified counseling: Secondary | ICD-10-CM

## 2019-12-03 DIAGNOSIS — Z1211 Encounter for screening for malignant neoplasm of colon: Secondary | ICD-10-CM

## 2019-12-03 LAB — LIPID PANEL
Cholesterol: 165 mg/dL (ref 0–200)
HDL: 47.4 mg/dL (ref >39.00)
LDL Cholesterol: 88 mg/dL (ref 0–99)
NonHDL: 117.62
Total CHOL/HDL Ratio: 3
Triglycerides: 147 mg/dL (ref 0.0–149.0)
VLDL: 29.4 mg/dL (ref 0.0–40.0)

## 2019-12-03 LAB — TSH: TSH: 8.48 u[IU]/mL — ABNORMAL HIGH (ref 0.35–4.50)

## 2019-12-03 LAB — BASIC METABOLIC PANEL
BUN: 17 mg/dL (ref 6–23)
CO2: 30 mEq/L (ref 19–32)
Calcium: 9.8 mg/dL (ref 8.4–10.5)
Chloride: 102 mEq/L (ref 96–112)
Creatinine, Ser: 0.77 mg/dL (ref 0.40–1.20)
GFR: 79.29 mL/min (ref >60.00)
Glucose, Bld: 149 mg/dL — ABNORMAL HIGH (ref 70–99)
Potassium: 4.2 mEq/L (ref 3.5–5.1)
Sodium: 139 mEq/L (ref 135–145)

## 2019-12-03 LAB — HEMOGLOBIN A1C: Hgb A1c MFr Bld: 7 % — ABNORMAL HIGH (ref 4.6–6.5)

## 2019-12-03 MED ORDER — ACYCLOVIR 5 % EX OINT: 1.0000 "application " | TOPICAL_OINTMENT | CUTANEOUS | 5 refills | Status: AC

## 2019-12-03 MED ORDER — ESCITALOPRAM OXALATE 10 MG PO TABS: 10.0000 mg | ORAL_TABLET | Freq: Every day | ORAL | 3 refills | Status: DC

## 2019-12-03 MED ORDER — CYCLOBENZAPRINE HCL 10 MG PO TABS: ORAL_TABLET | ORAL | 5 refills | Status: AC

## 2019-12-03 MED ORDER — VALACYCLOVIR HCL 1 G PO TABS: 2000.0000 mg | ORAL_TABLET | Freq: Two times a day (BID) | ORAL | 5 refills | Status: DC | PRN

## 2019-12-03 NOTE — Progress Notes (Signed)
This visit occurred during the SARS-CoV-2 public health emergency.  Safety protocols were in place, including screening questions prior to the visit, additional usage of staff PPE, and extensive cleaning of exam room while observing appropriate contact time as indicated for disinfecting solutions.  CPE- See plan.  Routine anticipatory guidance given to patient.  See health maintenance.  The possibility exists that previously documented standard health maintenance information may have been brought forward from a previous encounter into this note.  If needed, that same information has been updated to reflect the current situation based on today's encounter.    Tetanus 2012 Flu encouraged.   PNA and shingles d/w pt.  Not due. covid vaccine d/w pt.  Encouraged.   Diet and exercise d/w pt.  encouraged both.   D/w patient VE:[id number] for colon cancer screening, including IFOB vs. colonoscopy.  Risks and benefits of both were discussed and patient voiced understanding.  Pt elects for: cologuard.   Pap per gyn. I'll defer.  [facility].  Due for f/u.  D/w pt.   DXA not due.  Mammogram d/w pt.  Per gyn.  [facility].  Due for f/u.  D/w pt.   HCV screening d/w pt, not due.   HIV screening d/w pt. Prev done with prenatal care.   Living will d/w pt.  Would have her son Aileen Pilot [phone] designated if patient were incapacitated.  If needed, her mother would designated next if Aileen Pilot were unavailable.    Mood is better with lexapro.  No ADE on med.  She wanted to continue as is.    Flexeril helps, used prn for shoulder pain and prn for sleep. No ADE on med.    PMH and SH reviewed  Meds, vitals, and allergies reviewed.   ROS: Per HPI.  Unless specifically indicated otherwise in HPI, the patient denies:  General: fever. Eyes: acute vision changes ENT: sore throat Cardiovascular: chest pain Respiratory: SOB GI: vomiting GU: dysuria Musculoskeletal: acute back pain Derm: acute rash Neuro:  acute motor dysfunction Psych: worsening mood Endocrine: polydipsia Heme: bleeding Allergy: hayfever  GEN: nad, alert and oriented HEENT: ncat NECK: supple w/o LA CV: rrr. PULM: ctab, no inc wob ABD: soft, +bs EXT: no edema SKIN: no acute rash

## 2019-12-03 NOTE — Assessment & Plan Note (Signed)
Living will d/w pt.  Would have her son Aileen Pilot [phone] designated if patient were incapacitated.  If needed, her mother would designated next if Aileen Pilot were unavailable.

## 2019-12-03 NOTE — Patient Instructions (Signed)
We'll get cologuard set up.  Go to the lab on the way out.   If you have mychart we'll likely use that to update you.    Ask about follow up with gynecology.  Take care.  Glad to see you.

## 2019-12-04 ENCOUNTER — Other Ambulatory Visit: Payer: Self-pay | Admitting: Family Medicine

## 2019-12-04 DIAGNOSIS — R809 Proteinuria, unspecified: Secondary | ICD-10-CM | POA: Insufficient documentation

## 2019-12-04 DIAGNOSIS — E039 Hypothyroidism, unspecified: Secondary | ICD-10-CM

## 2019-12-04 DIAGNOSIS — E1129 Type 2 diabetes mellitus with other diabetic kidney complication: Secondary | ICD-10-CM | POA: Insufficient documentation

## 2019-12-04 DIAGNOSIS — E119 Type 2 diabetes mellitus without complications: Secondary | ICD-10-CM | POA: Insufficient documentation

## 2019-12-04 MED ORDER — LEVOTHYROXINE SODIUM 25 MCG PO TABS: 25.0000 ug | ORAL_TABLET | Freq: Every day | ORAL | 3 refills | Status: DC

## 2019-12-04 NOTE — Assessment & Plan Note (Signed)
Tetanus 2012 Flu encouraged.   PNA and shingles d/w pt.  Not due. covid vaccine d/w pt.  Encouraged.   Diet and exercise d/w pt.  encouraged both.   D/w patient TM:[id number] for colon cancer screening, including IFOB vs. colonoscopy.  Risks and benefits of both were discussed and patient voiced understanding.  Pt elects for: cologuard.   Pap per gyn. I'll defer.  [facility].  Due for f/u.  D/w pt.   DXA not due.  Mammogram d/w pt.  Per gyn.  [facility].  Due for f/u.  D/w pt.   HCV screening d/w pt, not due.   HIV screening d/w pt. Prev done with prenatal care.   Living will d/w pt.  Would have her son Christy Ferrell [phone] designated if patient were incapacitated.  If needed, her mother would designated next if Christy Ferrell were unavailable.    See notes on follow-up labs.

## 2019-12-04 NOTE — Assessment & Plan Note (Signed)
Mood is better with lexapro.  No ADE on med.  She wanted to continue as is.

## 2019-12-04 NOTE — Assessment & Plan Note (Signed)
Flexeril helps, used prn for shoulder pain and prn for sleep. No ADE on med.

## 2020-01-01 ENCOUNTER — Encounter: Payer: Self-pay | Admitting: Family Medicine

## 2020-01-07 ENCOUNTER — Other Ambulatory Visit: Payer: Self-pay

## 2020-01-07 ENCOUNTER — Ambulatory Visit (INDEPENDENT_AMBULATORY_CARE_PROVIDER_SITE_OTHER): Payer: No Typology Code available for payment source | Admitting: Obstetrics and Gynecology

## 2020-01-07 ENCOUNTER — Encounter: Payer: Self-pay | Admitting: Obstetrics and Gynecology

## 2020-01-07 VITALS — BP 130/80 | Ht 63.0 in | Wt 173.0 lb

## 2020-01-07 DIAGNOSIS — N6310 Unspecified lump in the right breast, unspecified quadrant: Secondary | ICD-10-CM

## 2020-01-07 DIAGNOSIS — Z1231 Encounter for screening mammogram for malignant neoplasm of breast: Secondary | ICD-10-CM | POA: Diagnosis not present

## 2020-01-07 NOTE — Patient Instructions (Signed)
I value your feedback and entrusting us with your care. If you get a [facility] patient survey, I would appreciate you taking the time to let us know about your experience today. Thank you!  As of April 25, 2019, your lab results will be released to your MyChart immediately, before I even have a chance to see them. Please give me time to review them and contact you if there are any abnormalities. Thank you for your patience.  

## 2020-01-07 NOTE — Progress Notes (Addendum)
Christy Ghent, MD   Chief Complaint  Patient presents with  . Breast Exam    lump on RB, tenderness and some redness possibly    HPI:      [name] is a 49 y.o. No obstetric history on file. whose LMP was No LMP recorded. (Menstrual status: IUD)., presents today for NP breast exam. Noticed RT breast mass during SBE due to tenderness, this AM in shower. Hasn't felt it before. Noticed erythema,  denies any trauma or nipple d/c. No fevers, no signs of infection. No hx of prior breast masses.  Last mammo 01/12/12 was negative (done in our office).  FH breast cancer in her mat cousin.  Past due for annual. Last pap 9/17 was ASCUS/neg HPV DNA. Has Mirena IUD placed 11/22/12, no bleeding.   Past Medical History:  Diagnosis Date  . Anxiety    h/o, was prev on zoloft  . Cholelithiasis   . Hypothyroidism    postpartum hypothyroidism  . IUD (intrauterine device) in place    mirena per [employer]  . Major depression     Past Surgical History:  Procedure Laterality Date  . CESAREAN SECTION     X 2 Fetal stress, second scheduled  . CHOLECYSTECTOMY  05/1998    Family History  Problem Relation Age of Onset  . Cancer Father 50       Prostate, S/P prostatectomy  . Hypertension Father   . Depression Father   . Asthma Sister   . Spina bifida Sister   . Hypothyroidism Mother   . Hypertension Maternal Grandmother   . Hyperlipidemia Maternal Grandmother   . Lung disease Maternal Grandmother   . Hypothyroidism Maternal Grandfather   . Emphysema Paternal Grandmother   . Diabetes Maternal Uncle   . Diabetes Maternal Uncle   . Leukemia Maternal Uncle 30  . Breast cancer Other 45  . Stroke Neg Hx   . Colon cancer Neg Hx     Social History   Socioeconomic History  . Marital status: Divorced    Spouse name: Not on file  . Number of children: Not on file  . Years of education: Not on file  . Highest education level: Not on file  Occupational History  .  Occupation: Housewife  Tobacco Use  . Smoking status: Never Smoker  . Smokeless tobacco: Never Used  Vaping Use  . Vaping Use: Never used  Substance and Sexual Activity  . Alcohol use: Yes    Alcohol/week: 0.0 standard drinks    Comment: Very rarely.  . Drug use: No  . Sexual activity: Yes    Birth control/protection: I.U.D.    Comment: Mirena  Other Topics Concern  . Not on file  Social History Narrative   Married 1997, divorced 2017   In relationship as of 2021   2 kids   Social Determinants of Health   Financial Resource Strain:   . Difficulty of Paying Living Expenses: Not on file  Food Insecurity:   . Worried About Charity fundraiser in the Last Year: Not on file  . Ran Out of Food in the Last Year: Not on file  Transportation Needs:   . Lack of Transportation (Medical): Not on file  . Lack of Transportation (Non-Medical): Not on file  Physical Activity:   . Days of Exercise per Week: Not on file  . Minutes of Exercise per Session: Not on file  Stress:   . Feeling of Stress :  Not on file  Social Connections:   . Frequency of Communication with Friends and Family: Not on file  . Frequency of Social Gatherings with Friends and Family: Not on file  . Attends Religious Services: Not on file  . Active Member of Clubs or Organizations: Not on file  . Attends Archivist Meetings: Not on file  . Marital Status: Not on file  Intimate Partner Violence:   . Fear of Current or Ex-Partner: Not on file  . Emotionally Abused: Not on file  . Physically Abused: Not on file  . Sexually Abused: Not on file    Outpatient Medications Prior to Visit  Medication Sig Dispense Refill  . acyclovir ointment (ZOVIRAX) 5 % Apply 1 application topically every 3 (three) hours. As needed. 5 g 5  . cyclobenzaprine (FLEXERIL) 10 MG tablet TAKE 1/2 TO 1 TABLET BY MOUTH EVERY DAY AS NEEDED 30 tablet 5  . escitalopram (LEXAPRO) 10 MG tablet Take 1 tablet (10 mg total) by mouth daily.  90 tablet 3  . ibuprofen (ADVIL,MOTRIN) 200 MG tablet Take 200 mg by mouth every 6 (six) hours as needed.    Marland Kitchen levonorgestrel (MIRENA) 20 MCG/24HR IUD 1 each by Intrauterine route once. 11/05/12    . levothyroxine (SYNTHROID) 25 MCG tablet Take 1 tablet (25 mcg total) by mouth daily before breakfast. 90 tablet 3  . valACYclovir (VALTREX) 1000 MG tablet Take 2 tablets (2,000 mg total) by mouth 2 (two) times daily as needed. For 1 day. 20 tablet 5   No facility-administered medications prior to visit.      ROS:  Review of Systems  Constitutional: Negative for fever.  Gastrointestinal: Negative for blood in stool, constipation, diarrhea, nausea and vomiting.  Genitourinary: Negative for dyspareunia, dysuria, flank pain, frequency, hematuria, urgency, vaginal bleeding, vaginal discharge and vaginal pain.  Musculoskeletal: Negative for back pain.  Skin: Negative for rash.   BREAST: mass/tenderness   OBJECTIVE:   Vitals:  BP 130/80   Ht 5\' 3"  (1.6 m)   Wt 173 lb (78.5 kg)   BMI 30.65 kg/m   Physical Exam Vitals reviewed.  Pulmonary:     Effort: Pulmonary effort is normal.  Chest:     Breasts: Breasts are symmetrical.        Right: Mass, skin change and tenderness present. No inverted nipple or nipple discharge.        Left: No inverted nipple, mass, nipple discharge, skin change or tenderness.    Musculoskeletal:        General: Normal range of motion.     Cervical back: Normal range of motion.  Skin:    General: Skin is warm and dry.  Neurological:     General: No focal deficit present.     Mental Status: She is alert and oriented to person, place, and time.     Cranial Nerves: No cranial nerve deficit.  Psychiatric:        Mood and Affect: Mood normal.        Behavior: Behavior normal.        Thought Content: Thought content normal.        Judgment: Judgment normal.     Assessment/Plan: Breast mass, right - Plan: US BREAST LTD UNI RIGHT INC AXILLA, US BREAST LTD  UNI LEFT INC AXILLA, MM DIAG BREAST TOMO BILATERAL  Encounter for screening mammogram for malignant neoplasm of breast - Plan: US BREAST LTD UNI RIGHT INC AXILLA, US BREAST LTD UNI LEFT INC AXILLA, MM  DIAG BREAST TOMO BILATERAL  Will have [place] burn [employer] from 2013 and send to [facility].  Given erythema/exam, question early breast infection vs breast mass. Sched dx mammo and u/s. Instructed pt to f/u if develops larger area of thickness/mass, worsening erythema or fevers/chills. Will do abx if sx progress as infection. If not, will go ahead with imaging.    Return if symptoms worsen or fail to improve.  Osiris Odriscoll B. Idelia Caudell, PA-C 01/07/2020 2:44 PM

## 2020-01-15 ENCOUNTER — Other Ambulatory Visit: Payer: Self-pay | Admitting: Obstetrics and Gynecology

## 2020-01-15 ENCOUNTER — Ambulatory Visit
Admission: RE | Admit: 2020-01-15 | Discharge: 2020-01-15 | Disposition: A | Discharge status: Home / Self Care | Payer: No Typology Code available for payment source | Source: Ambulatory Visit | Attending: Obstetrics and Gynecology | Admitting: Obstetrics and Gynecology

## 2020-01-15 ENCOUNTER — Other Ambulatory Visit: Payer: Self-pay

## 2020-01-15 DIAGNOSIS — N6310 Unspecified lump in the right breast, unspecified quadrant: Secondary | ICD-10-CM

## 2020-01-15 DIAGNOSIS — Z1231 Encounter for screening mammogram for malignant neoplasm of breast: Secondary | ICD-10-CM | POA: Diagnosis present

## 2020-01-15 DIAGNOSIS — R928 Other abnormal and inconclusive findings on diagnostic imaging of breast: Secondary | ICD-10-CM

## 2020-01-15 DIAGNOSIS — C50919 Malignant neoplasm of unspecified site of unspecified female breast: Secondary | ICD-10-CM

## 2020-01-15 HISTORY — DX: Malignant neoplasm of unspecified site of unspecified female breast: C50.919

## 2020-01-15 IMAGING — US US BREAST*R* LIMITED INC AXILLA
1 series · 13 of 25 positions shown · non-contrast
Comparison: Previous exam(s).

CLINICAL DATA: Patient complains of a palpable right breast mass.

EXAM:
DIGITAL DIAGNOSTIC BILATERAL MAMMOGRAM WITH CAD AND TOMO
ULTRASOUND RIGHT BREAST

[Series 1: us breast*right* limited inc axilla · 0.05mm/px · 13 of 25 slices shown]
[im 1/25]
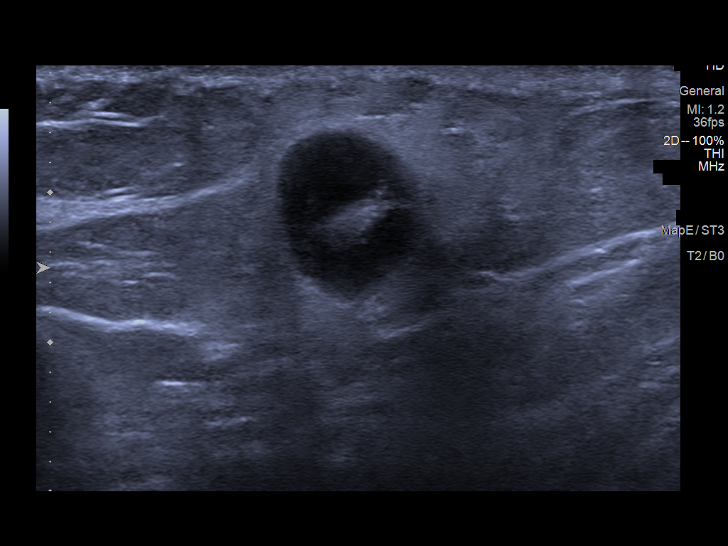
[im 3/25]
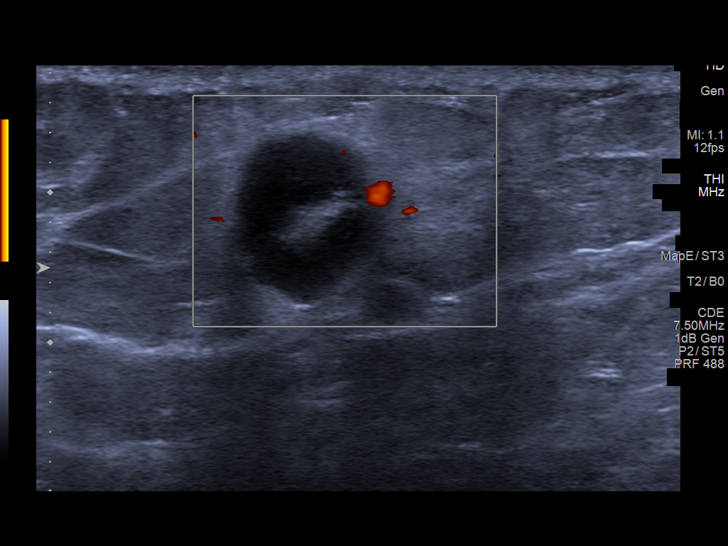
[im 5/25]
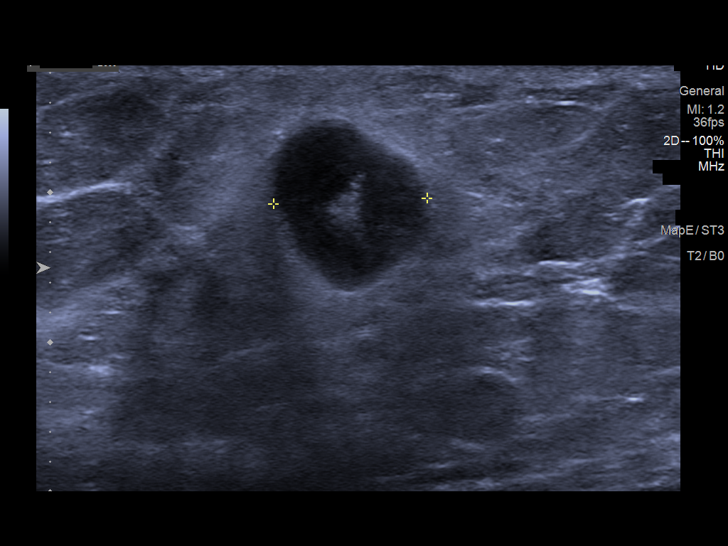
[im 7/25]
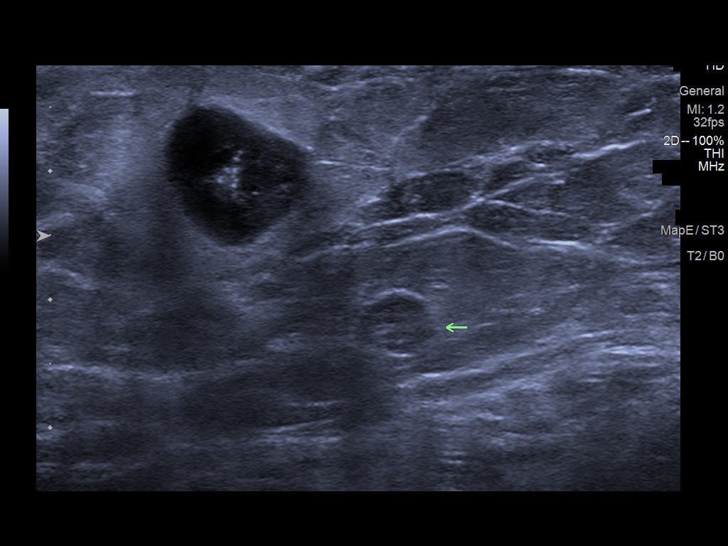
[im 9/25]
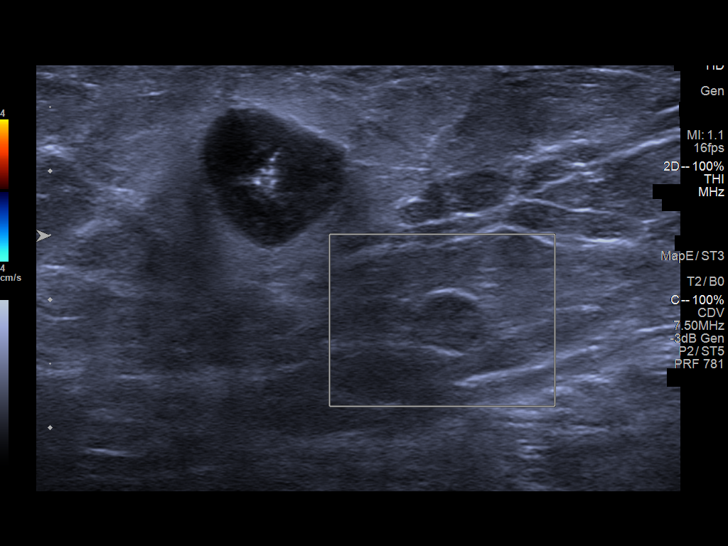
[im 11/25]
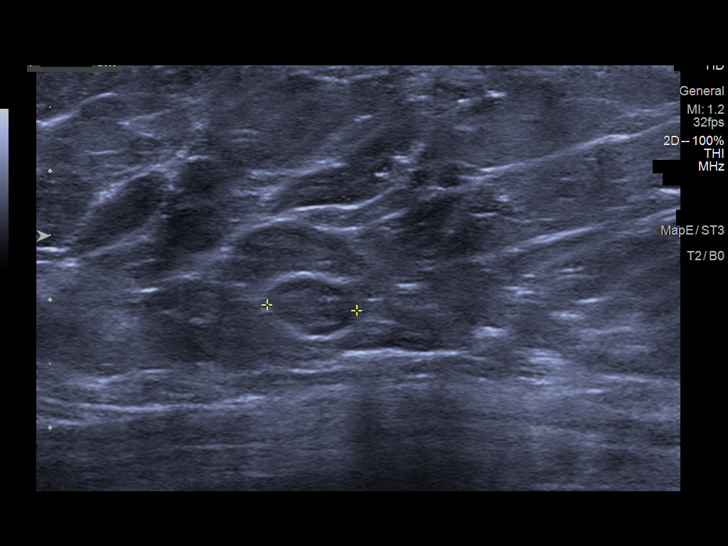
[im 13/25]
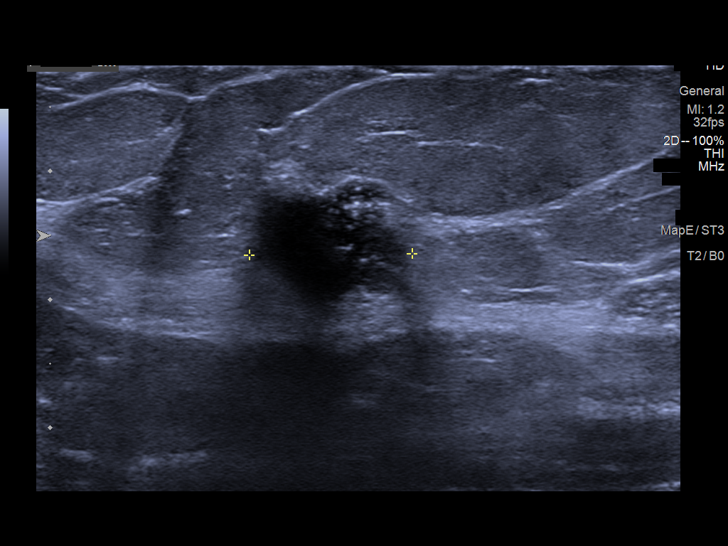
[im 15/25]
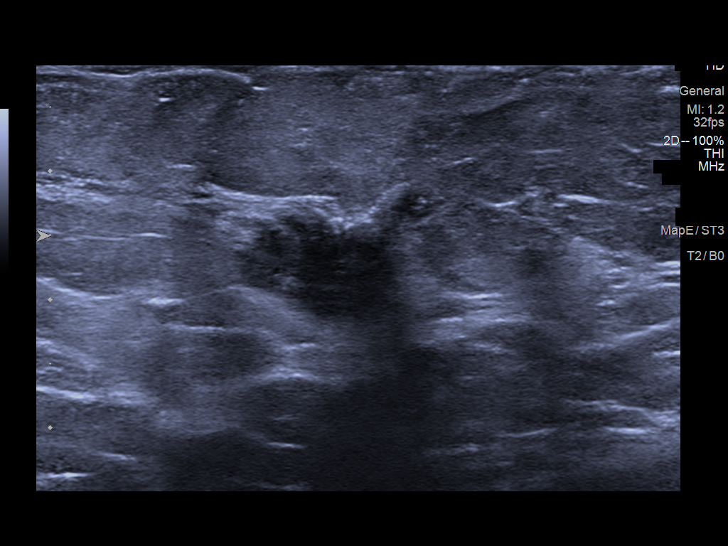
[im 17/25]
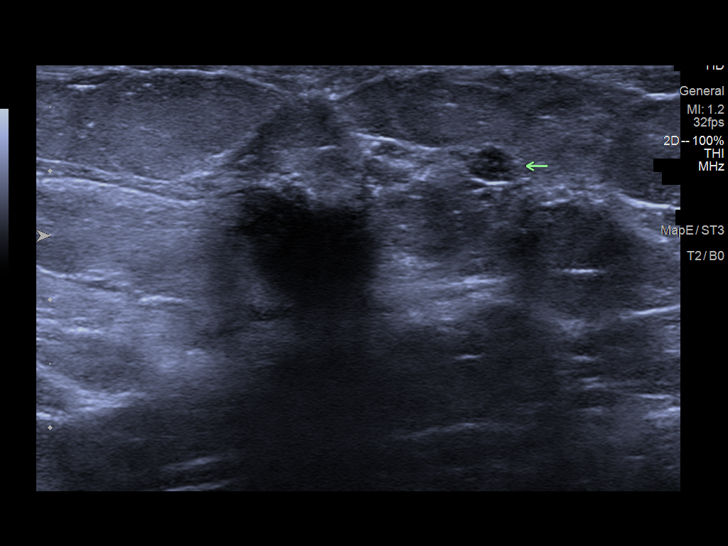
[im 19/25]
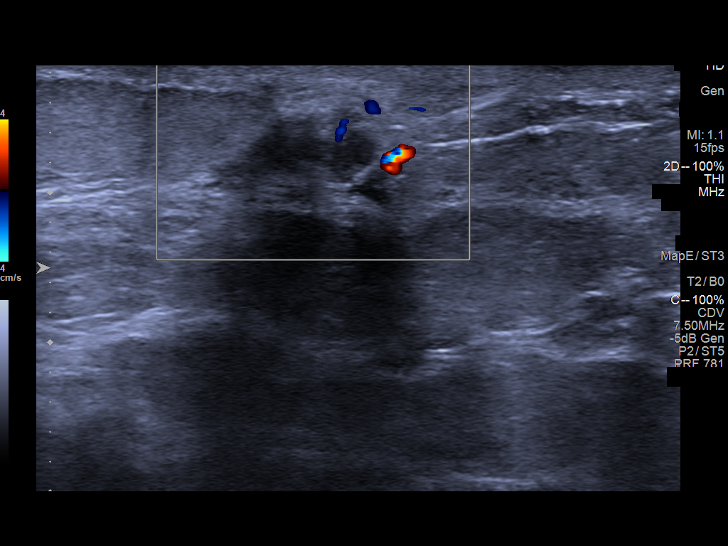
[im 21/25]
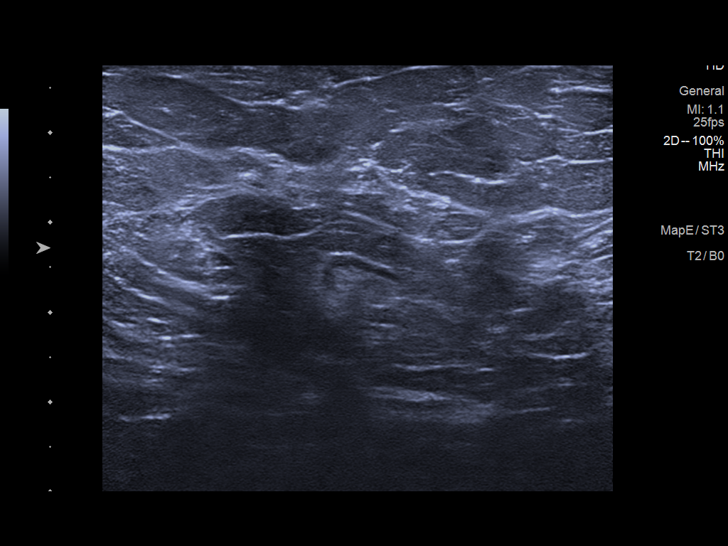
[im 23/25]
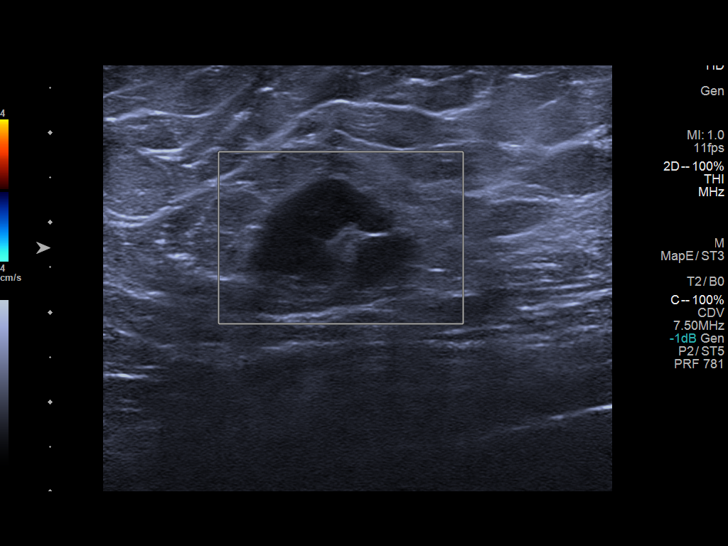
[im 25/25]
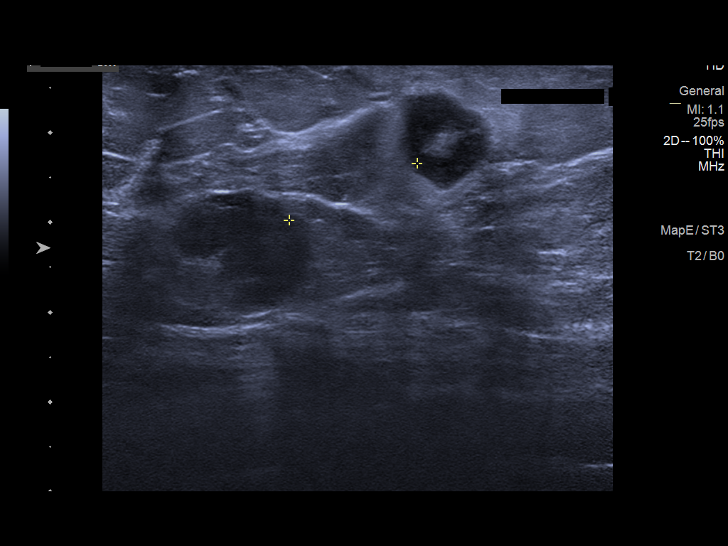

[13 of 25 positions shown; findings below may reference images not displayed]

ACR Breast Density Category b: There are scattered areas of
fibroglandular density.
FINDINGS: No suspicious mass or malignant type microcalcifications identified
in the left breast.

There is a 1.4 cm spiculated mass in the lower-inner quadrant of the
right breast and a 1.2 cm mass in the upper outer quadrant of the
right breast (palpable mass). There are no malignant type
microcalcifications.

Mammographic images were processed with CAD.

On physical exam, I palpate a mass in the right breast at [DATE] 11 cm
from the nipple. I palpate thickening in the right breast at [DATE]
1-2 cm from the nipple.

Targeted ultrasound is performed, showing an enlarged lymph node at
[DATE] 11 cm from the nipple accounting for the palpable abnormality
measuring 1.0 x 1.1 x 1.0 cm. 1.6 cm lateral is a larger abnormal
lymph node measuring 2.0 x 1.3 x 0.9 cm. There is a third benign
appearing lymph node 6 x 5 x 7 mm. Sonographic evaluation higher in
the right axilla does not show any additional enlarged adenopathy.

There is an irregular hypoechoic mass in the right breast at [DATE]
1-2 cm from the nipple measuring 1.6 x 1.1 x 1.3 cm. There is an
adjacent 5 mm hypoechoic mass that is likely a satellite nodule.
There is an irregular hypoechoic mass in the right breast at [DATE] in
the retroareolar region measuring 0.6 x 0.6 x 0.9 cm. This lesion is
located 1.6 cm from the larger mass.
IMPRESSION: 1. Suspicious mass in the [DATE] region of the right breast 1-2 cm
from the nipple with adjacent 5 mm probable satellite mass.

2. Suspicious mass in the [DATE] retroareolar region of the right
breast.

3. Two enlarged lymph nodes in the right breast at [DATE] 11 cm from
the nipple.

RECOMMENDATION:
1. Three ultrasound-guided core biopsies is recommended.
Ultrasound-guided core biopsies of the mass in the right breast at
[DATE] 1 to 2 cm from the nipple, mass at in the 530 region of the
right breast in the retroareolar region and the larger of the 2
lymph nodes at [DATE] 11 cm from the nipple is recommended.

2. The masses and lymph nodes are highly concerning for invasive
mammary carcinoma with metastatic adenopathy. There is likely at
least 1 satellite mass adjacent to the mass at [DATE] 1 to 2 cm from
the nipple. Further evaluation with MRI is recommended.

I have discussed the findings and recommendations with the patient.
If applicable, a reminder letter will be sent to the patient
regarding the next appointment.

BI-RADS CATEGORY  5: Highly suggestive of malignancy.

## 2020-01-15 IMAGING — MG DIGITAL DIAGNOSTIC BILAT W/ TOMO W/ CAD
6 of 12 series · 6 of 36 positions shown · non-contrast
Comparison: Previous exam(s).

CLINICAL DATA: Patient complains of a palpable right breast mass.

EXAM:
DIGITAL DIAGNOSTIC BILATERAL MAMMOGRAM WITH CAD AND TOMO
ULTRASOUND RIGHT BREAST

[R CC synth-2D]
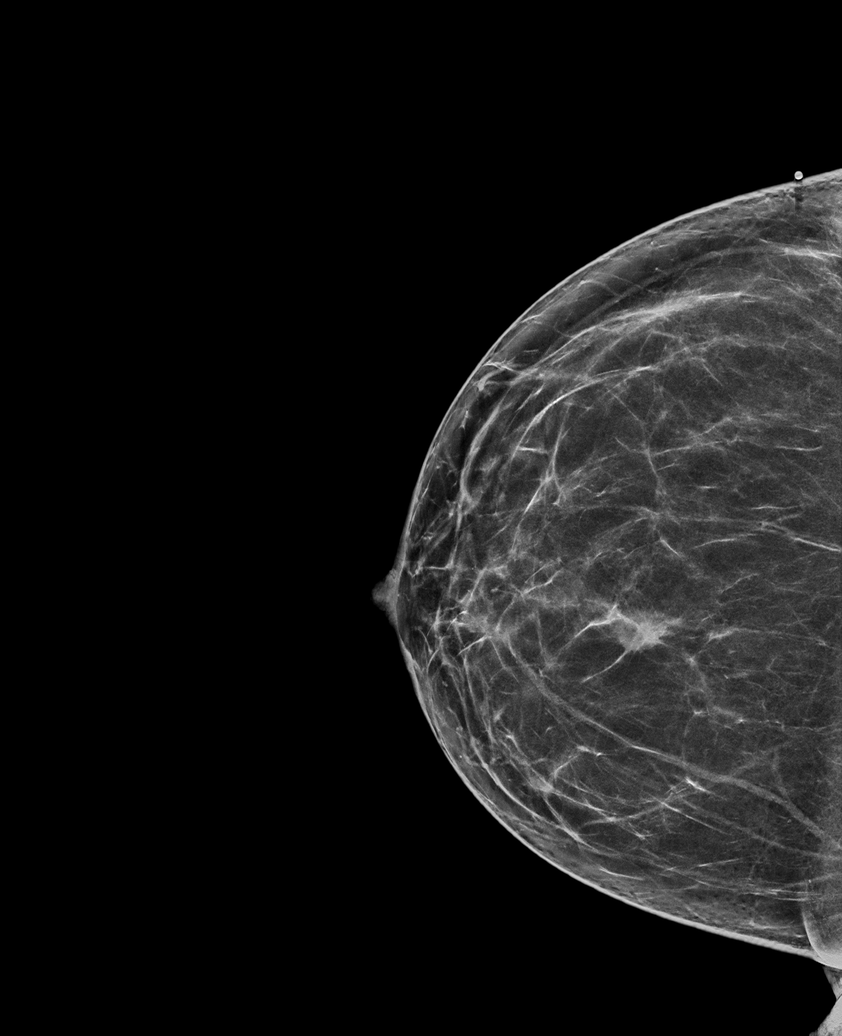

[L CC synth-2D]
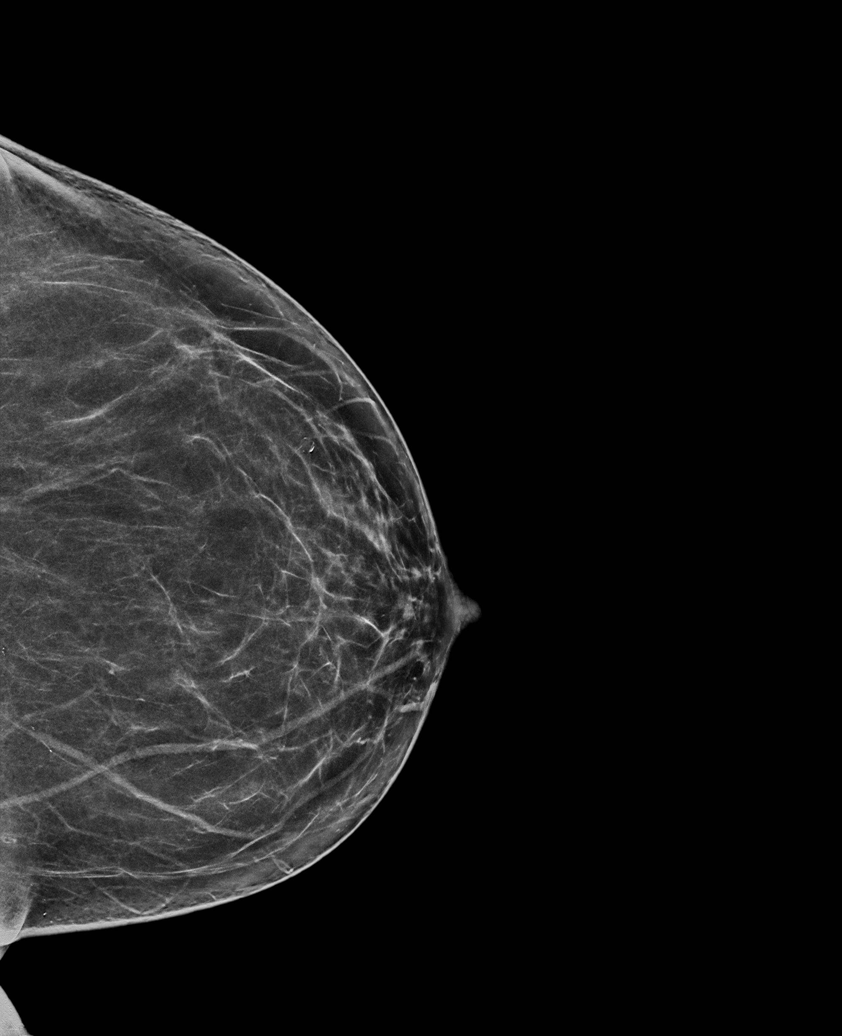

[R MLO synth-2D]
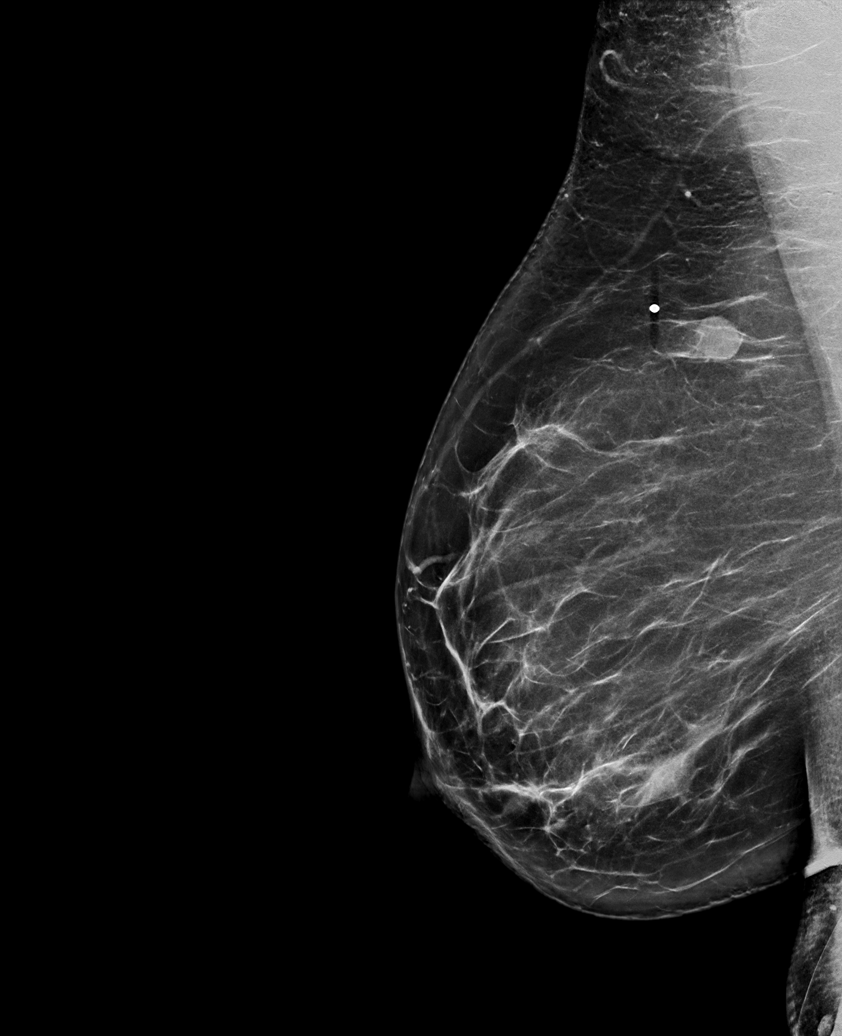

[R XCCL synth-2D]
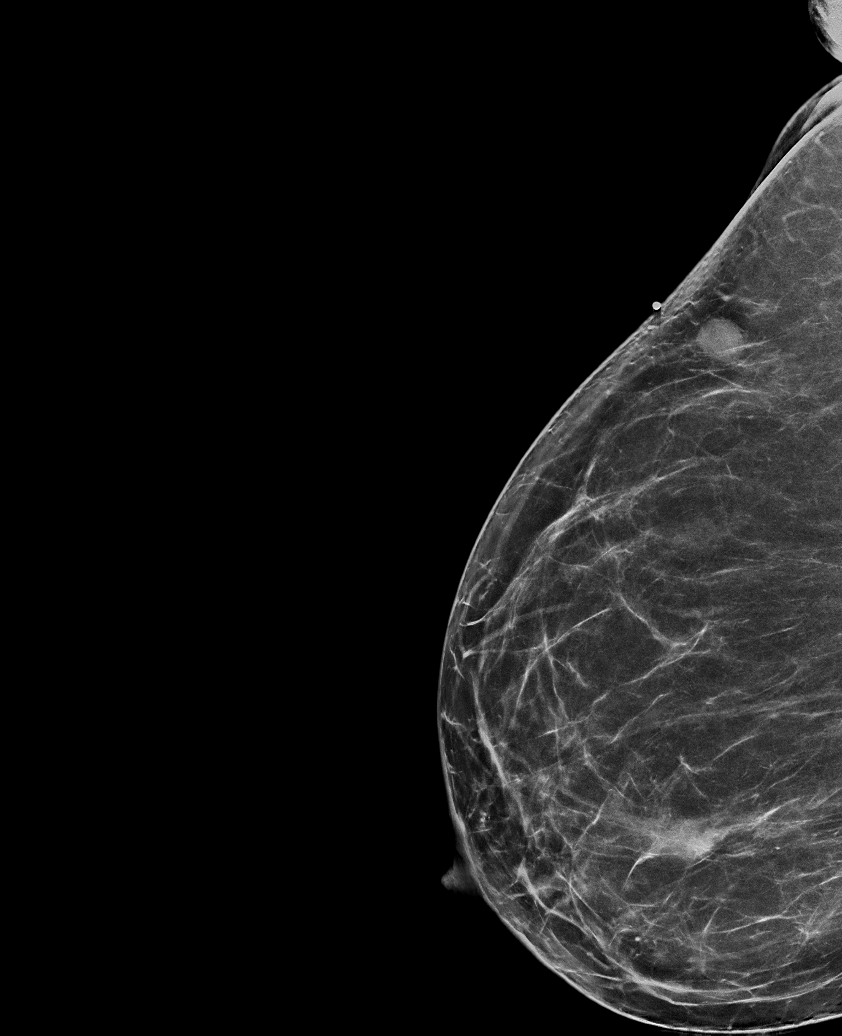

[R TAN synth-2D]
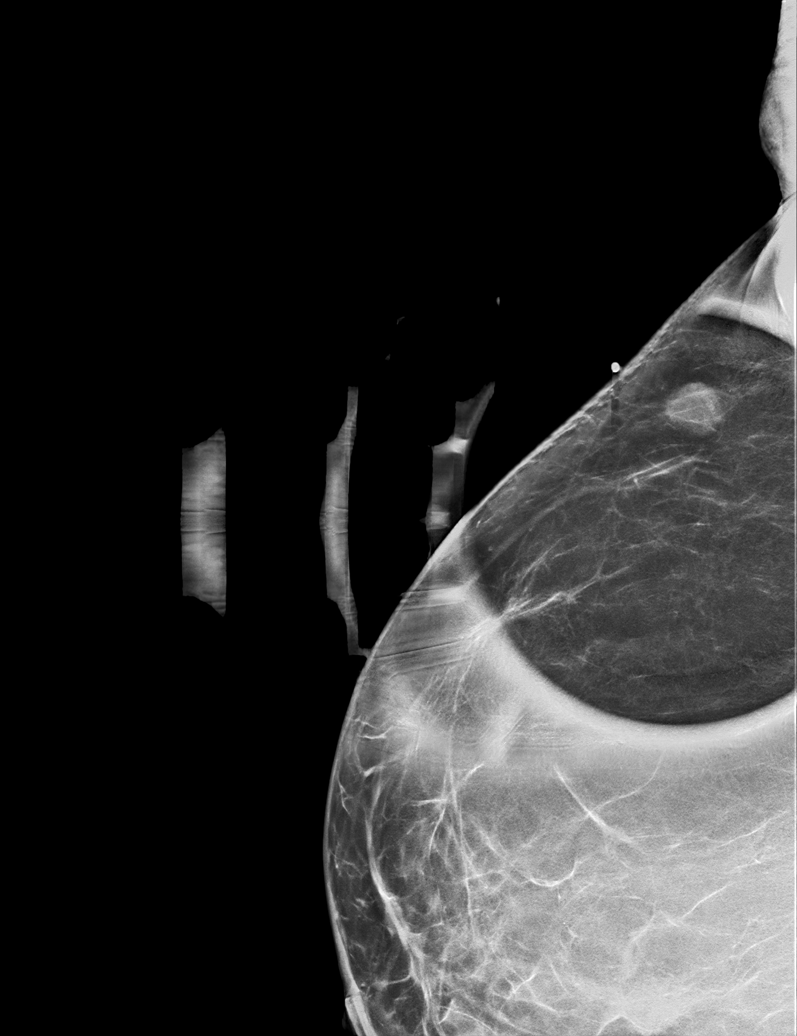

[L MLO synth-2D]
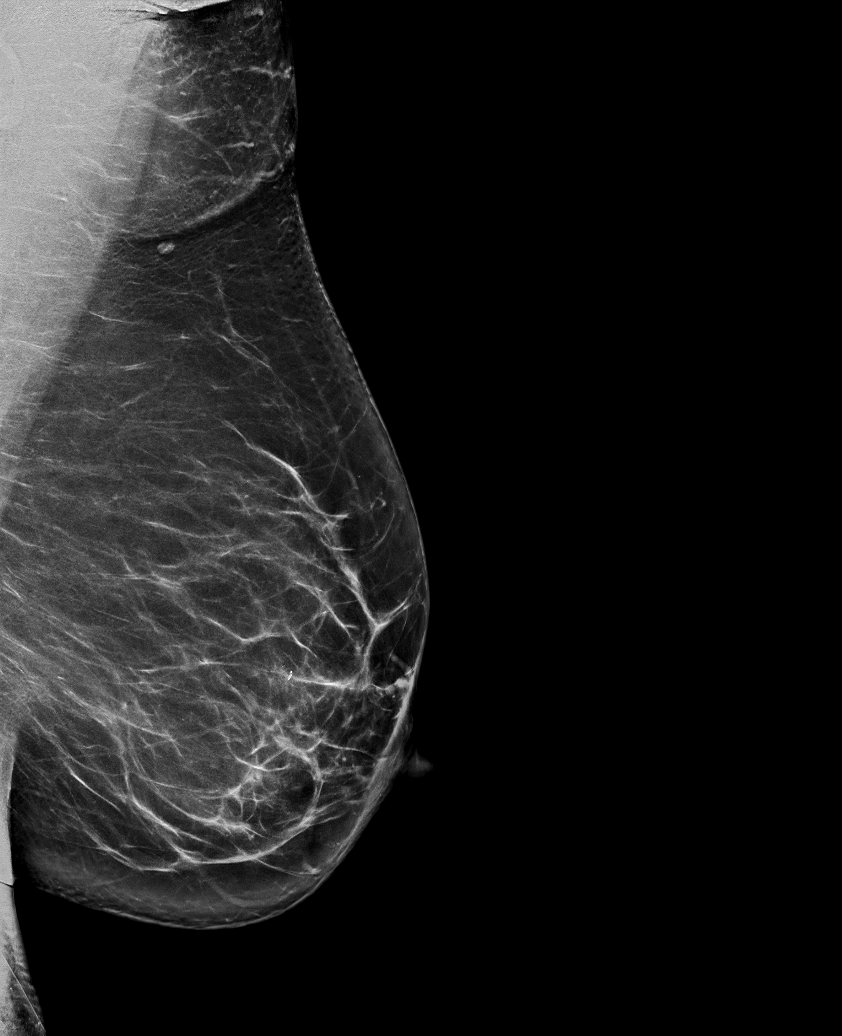

[6 of 36 positions shown; findings below may reference images not displayed]

ACR Breast Density Category b: There are scattered areas of
fibroglandular density.
FINDINGS: No suspicious mass or malignant type microcalcifications identified
in the left breast.

There is a 1.4 cm spiculated mass in the lower-inner quadrant of the
right breast and a 1.2 cm mass in the upper outer quadrant of the
right breast (palpable mass). There are no malignant type
microcalcifications.

Mammographic images were processed with CAD.

On physical exam, I palpate a mass in the right breast at [DATE] 11 cm
from the nipple. I palpate thickening in the right breast at [DATE]
1-2 cm from the nipple.

Targeted ultrasound is performed, showing an enlarged lymph node at
[DATE] 11 cm from the nipple accounting for the palpable abnormality
measuring 1.0 x 1.1 x 1.0 cm. 1.6 cm lateral is a larger abnormal
lymph node measuring 2.0 x 1.3 x 0.9 cm. There is a third benign
appearing lymph node 6 x 5 x 7 mm. Sonographic evaluation higher in
the right axilla does not show any additional enlarged adenopathy.

There is an irregular hypoechoic mass in the right breast at [DATE]
1-2 cm from the nipple measuring 1.6 x 1.1 x 1.3 cm. There is an
adjacent 5 mm hypoechoic mass that is likely a satellite nodule.
There is an irregular hypoechoic mass in the right breast at [DATE] in
the retroareolar region measuring 0.6 x 0.6 x 0.9 cm. This lesion is
located 1.6 cm from the larger mass.
IMPRESSION: 1. Suspicious mass in the [DATE] region of the right breast 1-2 cm
from the nipple with adjacent 5 mm probable satellite mass.

2. Suspicious mass in the [DATE] retroareolar region of the right
breast.

3. Two enlarged lymph nodes in the right breast at [DATE] 11 cm from
the nipple.

RECOMMENDATION:
1. Three ultrasound-guided core biopsies is recommended.
Ultrasound-guided core biopsies of the mass in the right breast at
[DATE] 1 to 2 cm from the nipple, mass at in the 530 region of the
right breast in the retroareolar region and the larger of the 2
lymph nodes at [DATE] 11 cm from the nipple is recommended.

2. The masses and lymph nodes are highly concerning for invasive
mammary carcinoma with metastatic adenopathy. There is likely at
least 1 satellite mass adjacent to the mass at [DATE] 1 to 2 cm from
the nipple. Further evaluation with MRI is recommended.

I have discussed the findings and recommendations with the patient.
If applicable, a reminder letter will be sent to the patient
regarding the next appointment.

BI-RADS CATEGORY  5: Highly suggestive of malignancy.

## 2020-01-17 ENCOUNTER — Ambulatory Visit
Admission: RE | Admit: 2020-01-17 | Discharge: 2020-01-17 | Disposition: A | Discharge status: Home / Self Care | Payer: No Typology Code available for payment source | Source: Ambulatory Visit | Attending: Obstetrics and Gynecology | Admitting: Obstetrics and Gynecology

## 2020-01-17 ENCOUNTER — Other Ambulatory Visit: Payer: Self-pay

## 2020-01-17 DIAGNOSIS — R928 Other abnormal and inconclusive findings on diagnostic imaging of breast: Secondary | ICD-10-CM

## 2020-01-17 DIAGNOSIS — N6310 Unspecified lump in the right breast, unspecified quadrant: Secondary | ICD-10-CM

## 2020-01-17 IMAGING — MG US BREAST BX W/ LOC DEV EA ADD LESION IMG BX SPEC US GUIDE*R*
1 series · 5 of 8 positions shown · non-contrast
Comparison: Previous exam(s).
COMPARISON: Previous exam(s).
COMPARISON: Previous exam(s).

Addendum:
CLINICAL DATA: 1.6 cm mass in the 5:30 o'clock position of the
right breast, 1-2 cm from the nipple, with imaging features highly
suspicious for malignancy at recent mammography and ultrasound.
There was also a 0.9 cm mass highly suspicious for malignancy in the
5:30 o'clock retroareolar right breast. There are also 3 abnormal
appearing enlarged intramammary lymph nodes in the 9:30 o'clock
position of the right breast, 11 cm from the nipple, measuring up to
2.0 cm in maximum diameter each.

EXAM:
ULTRASOUND GUIDED RIGHT BREAST CORE NEEDLE BIOPSY X 3

[Series 1: MG view · 0.07mm/px · 5 of 47 slices shown]
[im 1/47]
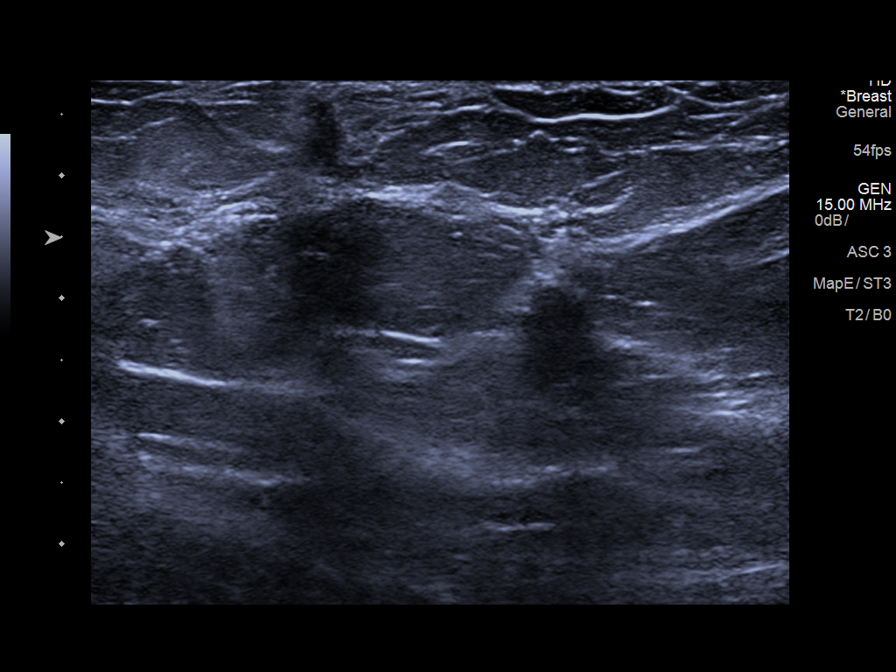
[im 7/47]
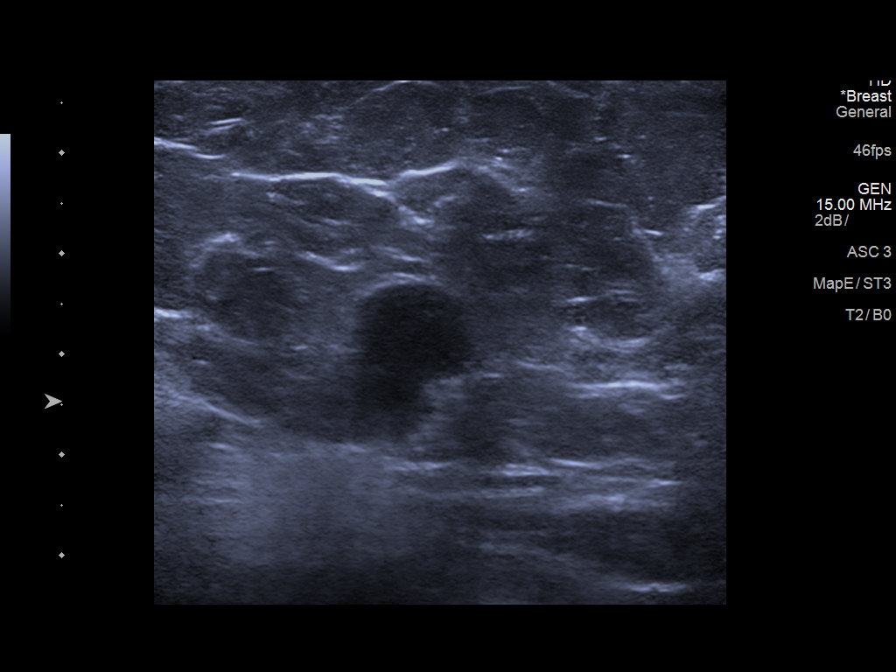
[im 14/47]
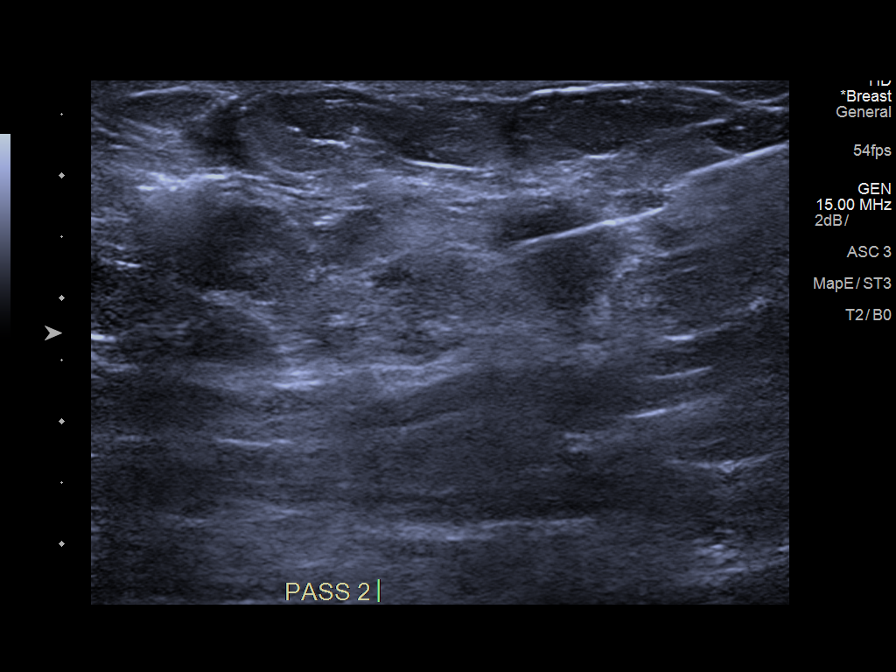
[im 20/47]
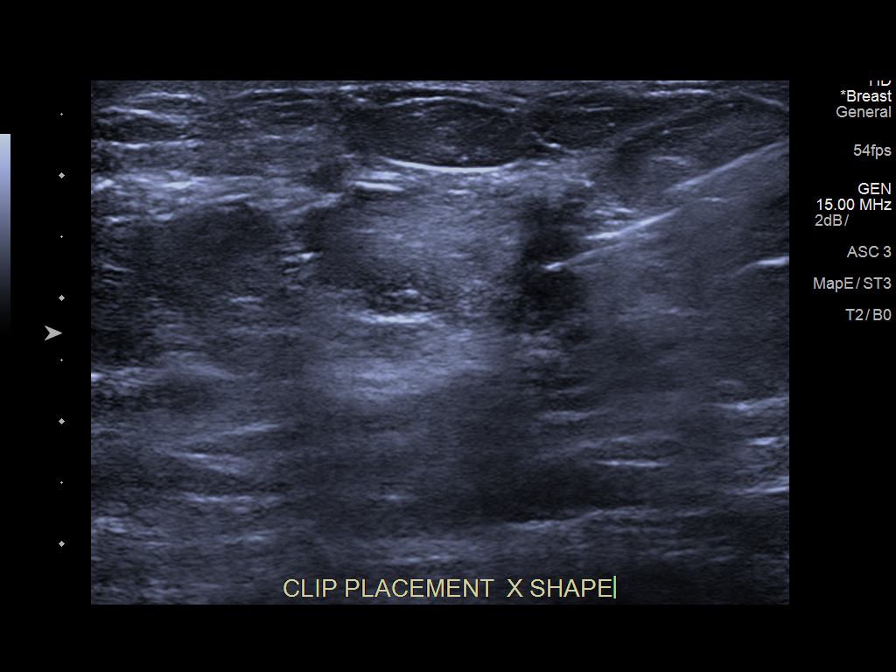
[im 27/47]
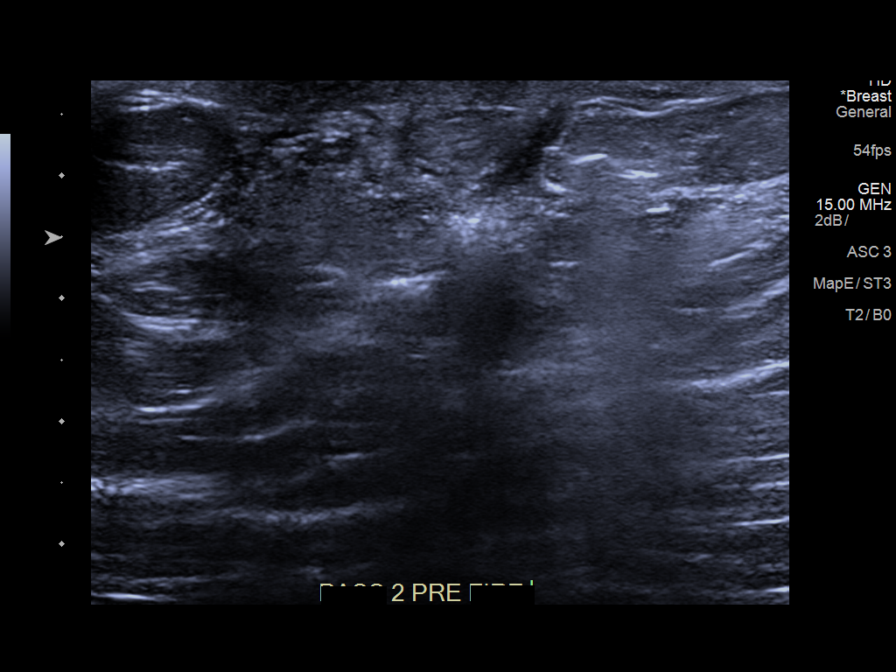

[5 of 8 positions shown; findings below may reference images not displayed]



SITE 1: 1.6 CM MASS IN THE 5:30 O'CLOCK POSITION OF THE RIGHT
BREAST, 1-2 CM FROM THE NIPPLE

Lesion quadrant: Lower inner quadrant

Using sterile technique and 1% Lidocaine as local anesthetic, under
direct ultrasound visualization, a 12 gauge [facility] device was
used to perform biopsy of the recently demonstrated 1.6 cm mass in
the 5:30 o'clock position of the right breast, 1-2 cm from the
nipple, using a caudal approach. At the conclusion of the procedure
a twisted X shaped tissue marker clip was deployed into the biopsy
cavity.

SITE 2: 0.9 CM MASS IN THE 5:30 O'CLOCK RETROAREOLAR RIGHT BREAST

Lesion quadrant: Lower inner quadrant

Using sterile technique and 1% Lidocaine as local anesthetic, under
direct ultrasound visualization, a 12 gauge [facility] device was
used to perform biopsy of the recently demonstrated 0.9 cm mass in
the 5:30 o'clock retroareolar right breast using a caudal approach.
At the conclusion of the procedure a Q shaped tissue marker clip was
deployed into the biopsy cavity.

SITE 3: 2.0 CM LYMPH NODE IN THE 9:30 O'CLOCK POSITION OF THE RIGHT
BREAST

Lesion quadrant: Upper outer quadrant

Using sterile technique and 1% Lidocaine as local anesthetic, under
direct ultrasound visualization, a 12 gauge [facility] device was
used to perform biopsy of the recently demonstrated 2.0 cm lymph
node in the 9:30 o'clock position of the right breast, 11 cm from
the nipple, using a caudal approach. At the conclusion of the
procedure a [employer] tissue marker clip was deployed into the
biopsy cavity.

Follow up 2 view mammogram was performed and dictated separately.
IMPRESSION: Ultrasound guided biopsy of a 1.6 cm mass in the 5:30 o'clock
position of the right breast, 1-2 cm from the nipple, 0.9 cm mass in
the 5:30 o'clock retroareolar right breast and 2.0 cm lymph node in
the 9:30 o'clock position of the right breast. No apparent
complications.

ADDENDUM:
PATHOLOGY revealed: Site A. RIGHT BREAST, [DATE] [P6];
ULTRASOUND-GUIDED BIOPSY: - INVASIVE MAMMARY CARCINOMA, NO SPECIAL
TYPE. 12 mm in this sample. Grade 2. Ductal carcinoma in situ:
Present, low-grade. Lymphovascular invasion: Not identified.

Pathology results are CONCORDANT with imaging findings, per Dr.

PATHOLOGY revealed: Site B. RIGHT BREAST, RETROAREOLAR;
ULTRASOUND-GUIDED BIOPSY: - DUCTAL CARCINOMA IN SITU. - NEGATIVE FOR
INVASIVE CARCINOMA. Pathology results are CONCORDANT with imaging
findings, per Dr. [facility].

PATHOLOGY revealed: Site C. AXILLARY LYMPH NODE, [DATE] 11 CMFN;
ULTRASOUND-GUIDED BIOPSY: - INVASIVE MAMMARY CARCINOMA, AS DESCRIBED
ABOVE (SEE PART A). - LYMPH NODE TISSUE IS NOT IDENTIFIED IN THIS
SAMPLE.

Pathology results are CONCORDANT with imaging findings, per Dr.
[facility].

Pathology results and recommendations below were discussed with
patient by telephone on [DATE]. Patient reported biopsy site
within normal limits with slight tenderness at the site. Post biopsy
care instructions were reviewed, questions were answered and my
direct phone number was provided to patient. Patient was instructed
to call [HOSPITAL] if any concerns or questions arise
related to the biopsy.

Recommendation: Surgical consultation for all three biopsied sites
listed above. Request for surgical consultation was relayed to [facility] RN and [facility] RN at [HOSPITAL] [HOSPITAL]
by [facility] RN on [DATE].

Pathology results reported by [facility] RN on [DATE].

ADDENDUM:
Recommendation: Consider bilateral breast MRI to evaluate extent of
breast disease.

Additional recommendation prepared by [facility] RN on [DATE].

*** End of Addendum ***
Addendum:
PROCEDURE:
I met with the patient and we discussed the procedure of
ultrasound-guided biopsy, including benefits and alternatives. We
discussed the high likelihood of a successful procedure. We
discussed the risks of the procedure, including infection, bleeding,
tissue injury, clip migration, and inadequate sampling. Informed
written consent was given. The usual time-out protocol was performed
immediately prior to the procedure.

SITE 1: 1.6 CM MASS IN THE 5:30 O'CLOCK POSITION OF THE RIGHT
BREAST, 1-2 CM FROM THE NIPPLE

Lesion quadrant: Lower inner quadrant

Using sterile technique and 1% Lidocaine as local anesthetic, under
direct ultrasound visualization, a 12 gauge [facility] device was
used to perform biopsy of the recently demonstrated 1.6 cm mass in
the 5:30 o'clock position of the right breast, 1-2 cm from the
nipple, using a caudal approach. At the conclusion of the procedure
a twisted X shaped tissue marker clip was deployed into the biopsy
cavity.

SITE 2: 0.9 CM MASS IN THE 5:30 O'CLOCK RETROAREOLAR RIGHT BREAST

Lesion quadrant: Lower inner quadrant

Using sterile technique and 1% Lidocaine as local anesthetic, under
direct ultrasound visualization, a 12 gauge [facility] device was
used to perform biopsy of the recently demonstrated 0.9 cm mass in
the 5:30 o'clock retroareolar right breast using a caudal approach.
At the conclusion of the procedure a Q shaped tissue marker clip was
deployed into the biopsy cavity.

SITE 3: 2.0 CM LYMPH NODE IN THE 9:30 O'CLOCK POSITION OF THE RIGHT
BREAST

Lesion quadrant: Upper outer quadrant

Using sterile technique and 1% Lidocaine as local anesthetic, under
direct ultrasound visualization, a 12 gauge [facility] device was
used to perform biopsy of the recently demonstrated 2.0 cm lymph
node in the 9:30 o'clock position of the right breast, 11 cm from
the nipple, using a caudal approach. At the conclusion of the
procedure a [employer] tissue marker clip was deployed into the
biopsy cavity.

Follow up 2 view mammogram was performed and dictated separately.
IMPRESSION: Ultrasound guided biopsy of a 1.6 cm mass in the 5:30 o'clock
position of the right breast, 1-2 cm from the nipple, 0.9 cm mass in
the 5:30 o'clock retroareolar right breast and 2.0 cm lymph node in
the 9:30 o'clock position of the right breast. No apparent
complications.

ADDENDUM:
PATHOLOGY revealed: Site A. RIGHT BREAST, [DATE] [P6];
ULTRASOUND-GUIDED BIOPSY: - INVASIVE MAMMARY CARCINOMA, NO SPECIAL
TYPE. 12 mm in this sample. Grade 2. Ductal carcinoma in situ:
Present, low-grade. Lymphovascular invasion: Not identified.

Pathology results are CONCORDANT with imaging findings, per Dr.

PATHOLOGY revealed: Site B. RIGHT BREAST, RETROAREOLAR;
ULTRASOUND-GUIDED BIOPSY: - DUCTAL CARCINOMA IN SITU. - NEGATIVE FOR
INVASIVE CARCINOMA. Pathology results are CONCORDANT with imaging
findings, per Dr. [facility].

PATHOLOGY revealed: Site C. AXILLARY LYMPH NODE, [DATE] 11 CMFN;
ULTRASOUND-GUIDED BIOPSY: - INVASIVE MAMMARY CARCINOMA, AS DESCRIBED
ABOVE (SEE PART A). - LYMPH NODE TISSUE IS NOT IDENTIFIED IN THIS
SAMPLE.

Pathology results are CONCORDANT with imaging findings, per Dr.
AP[facility].

Pathology results and recommendations below were discussed with
patient by telephone on [DATE]. Patient reported biopsy site
within normal limits with slight tenderness at the site. Post biopsy
care instructions were reviewed, questions were answered and my
direct phone number was provided to patient. Patient was instructed
to call [HOSPITAL] if any concerns or questions arise
related to the biopsy.

Recommendation: Surgical consultation for all three biopsied sites
listed above. Request for surgical consultation was relayed to [facility] RN and [facility] RN at [HOSPITAL] [HOSPITAL]
by [facility] RN on [DATE].

Pathology results reported by [facility] RN on [DATE].



SITE 1: 1.6 CM MASS IN THE 5:30 O'CLOCK POSITION OF THE RIGHT
BREAST, 1-2 CM FROM THE NIPPLE

Lesion quadrant: Lower inner quadrant

Using sterile technique and 1% Lidocaine as local anesthetic, under
direct ultrasound visualization, a 12 gauge [facility] device was
used to perform biopsy of the recently demonstrated 1.6 cm mass in
the 5:30 o'clock position of the right breast, 1-2 cm from the
nipple, using a caudal approach. At the conclusion of the procedure
a twisted X shaped tissue marker clip was deployed into the biopsy
cavity.

SITE 2: 0.9 CM MASS IN THE 5:30 O'CLOCK RETROAREOLAR RIGHT BREAST

Lesion quadrant: Lower inner quadrant

Using sterile technique and 1% Lidocaine as local anesthetic, under
direct ultrasound visualization, a 12 gauge [facility] device was
used to perform biopsy of the recently demonstrated 0.9 cm mass in
the 5:30 o'clock retroareolar right breast using a caudal approach.
At the conclusion of the procedure a Q shaped tissue marker clip was
deployed into the biopsy cavity.

SITE 3: 2.0 CM LYMPH NODE IN THE 9:30 O'CLOCK POSITION OF THE RIGHT
BREAST

Lesion quadrant: Upper outer quadrant

Using sterile technique and 1% Lidocaine as local anesthetic, under
direct ultrasound visualization, a 12 gauge [facility] device was
used to perform biopsy of the recently demonstrated 2.0 cm lymph
node in the 9:30 o'clock position of the right breast, 11 cm from
the nipple, using a caudal approach. At the conclusion of the
procedure a [employer] tissue marker clip was deployed into the
biopsy cavity.

Follow up 2 view mammogram was performed and dictated separately.
IMPRESSION: Ultrasound guided biopsy of a 1.6 cm mass in the 5:30 o'clock
position of the right breast, 1-2 cm from the nipple, 0.9 cm mass in
the 5:30 o'clock retroareolar right breast and 2.0 cm lymph node in
the 9:30 o'clock position of the right breast. No apparent
complications.

## 2020-01-17 IMAGING — MG MM BREAST LOCALIZATION CLIP
6 series · 6 of 18 positions shown · non-contrast
Comparison: Previous exam(s).

CLINICAL DATA: Status post ultrasound-guided core needle biopsies
of a 1.6 cm mass in the 5:30 o'clock position of the right breast,
1-2 cm from the nipple, 0.9 cm mass in 5:30 o'clock retroareolar
right breast and 2.0 cm enlarged lymph node in the 9:30 o'clock
position of the right breast, 11 cm from the nipple, in the axillary
tail region.

EXAM:
DIAGNOSTIC RIGHT MAMMOGRAM POST ULTRASOUND BIOPSY X 3

[R ML synth-2D (1 of 2)]
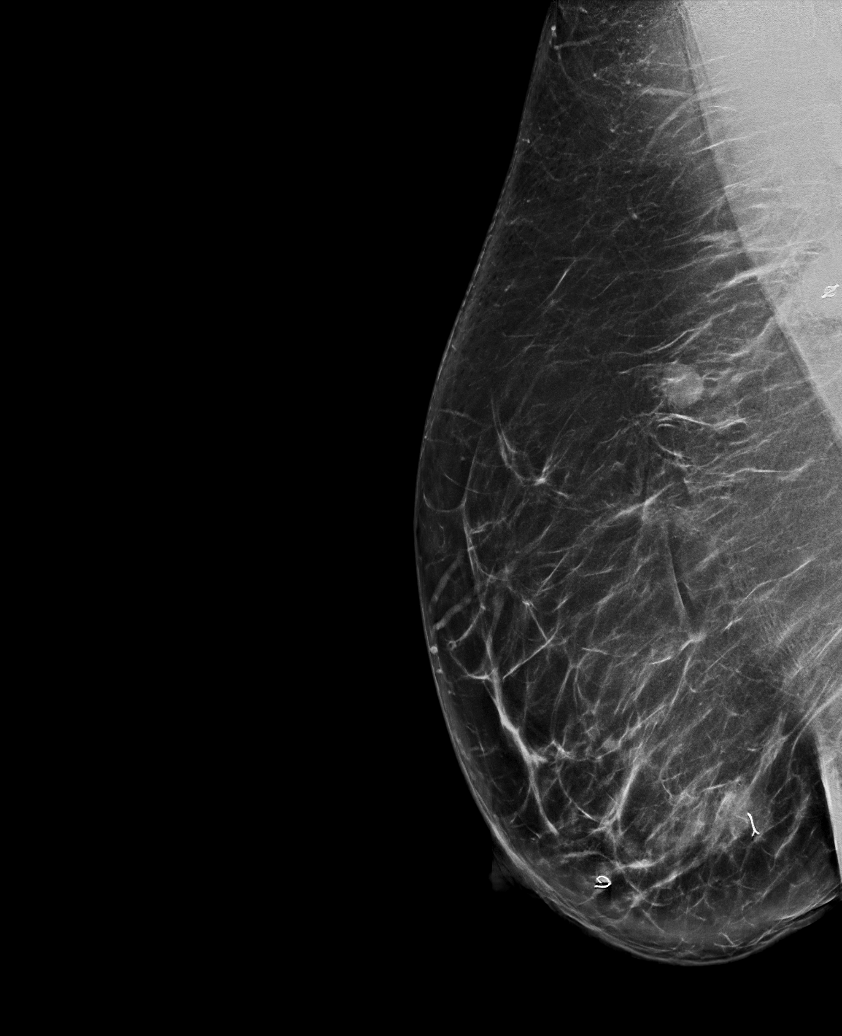

[R CC synth-2D]
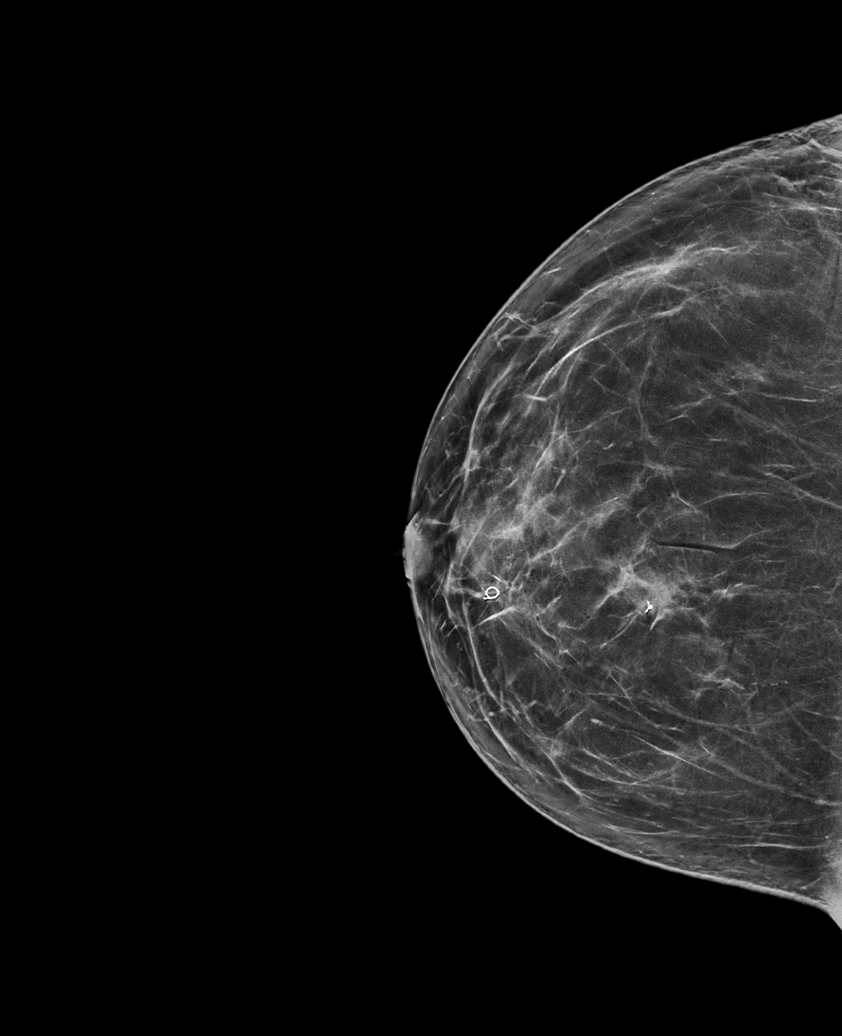

[R ML synth-2D (2 of 2)]
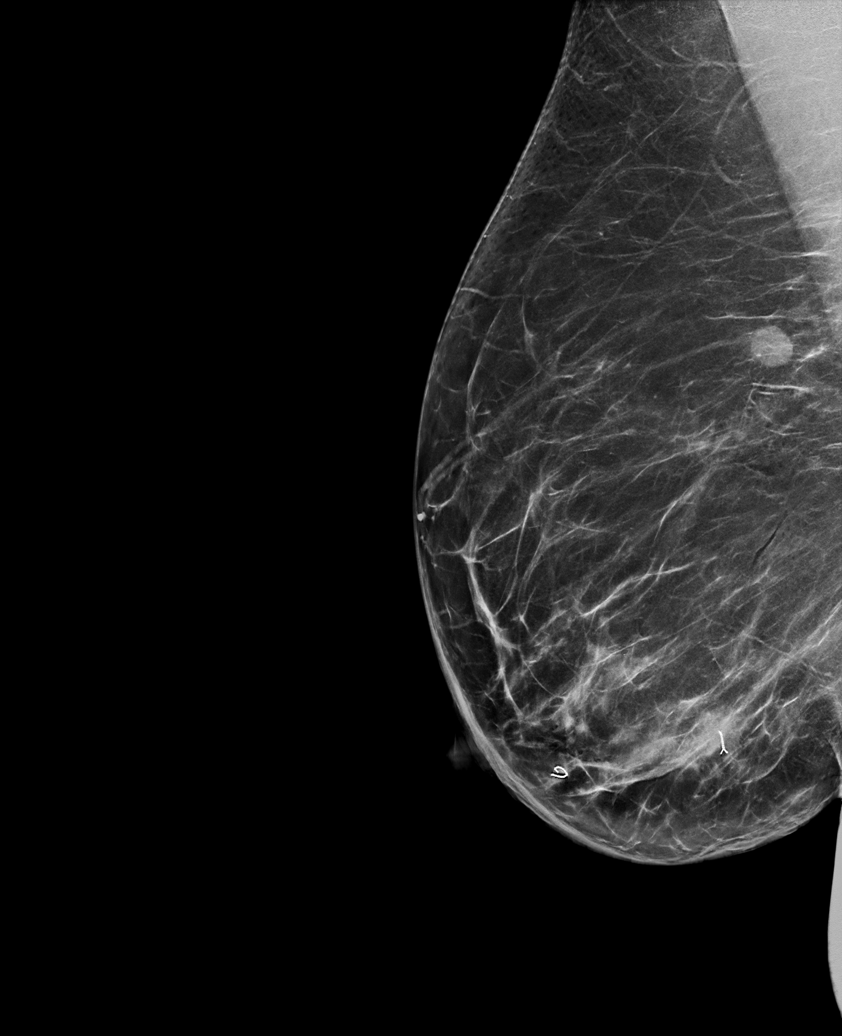

[R ML tomo (1 of 2) · tomo slice 45/90.0]
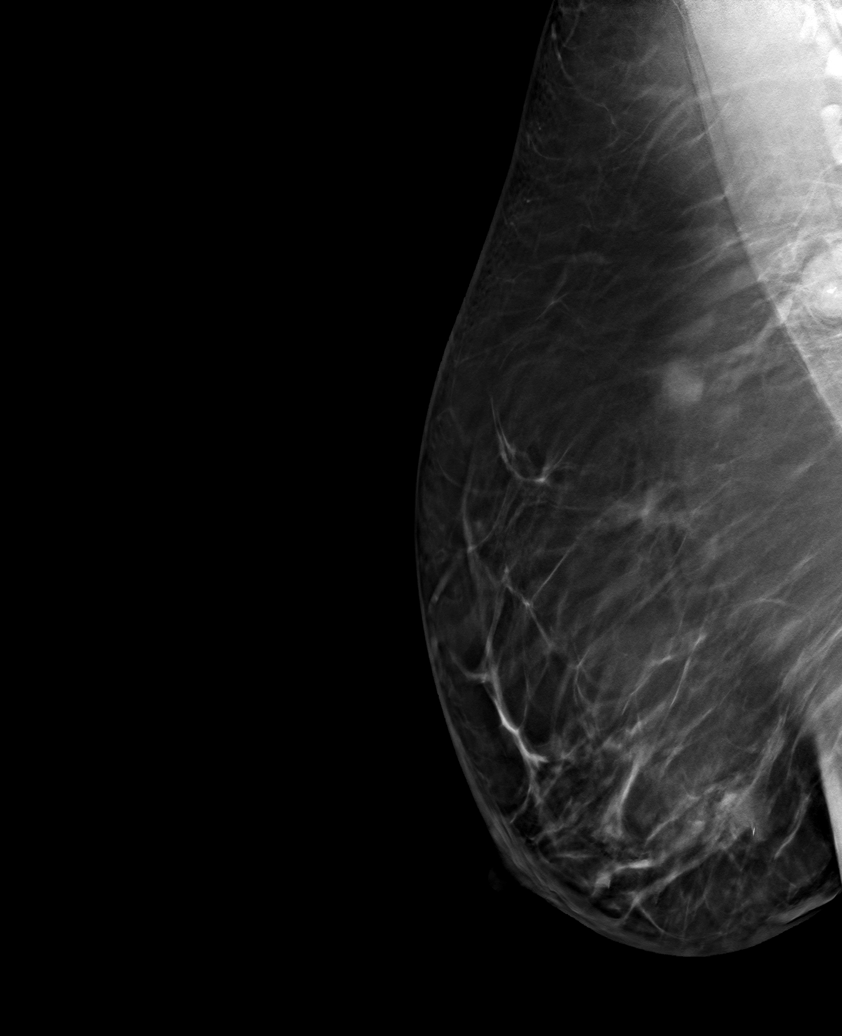

[R CC tomo · tomo slice 37/73.0]
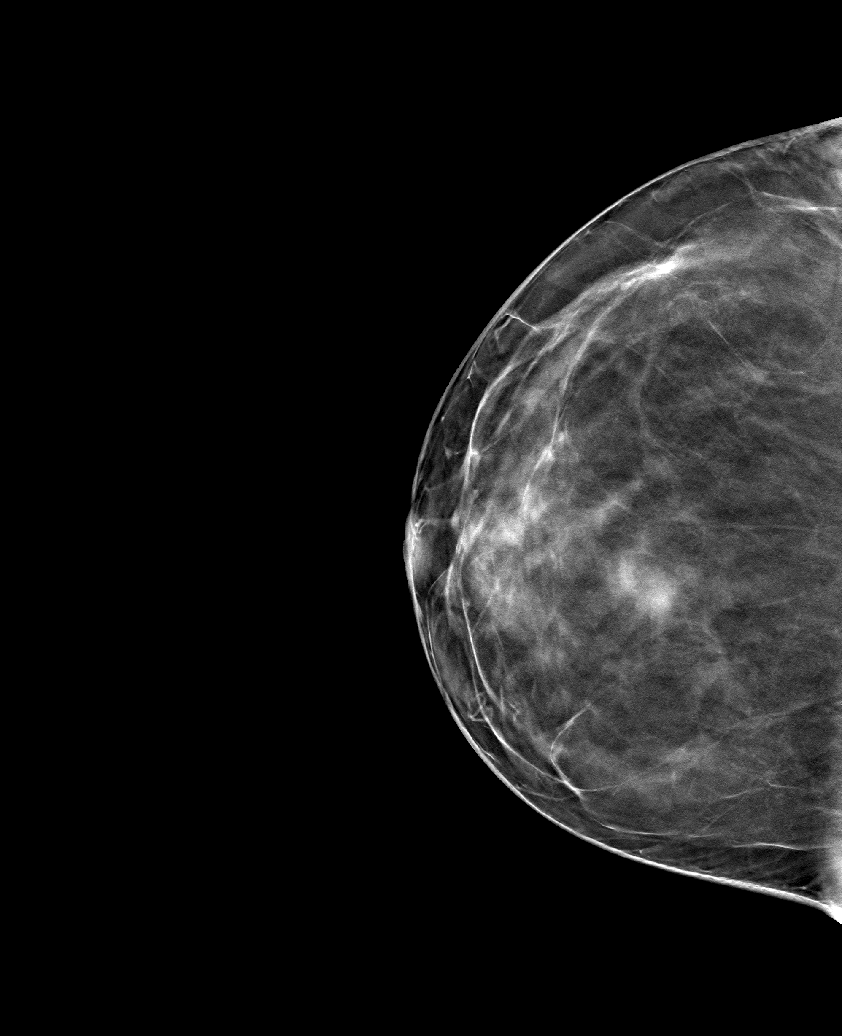

[R ML tomo (2 of 2) · tomo slice 39/78.0]
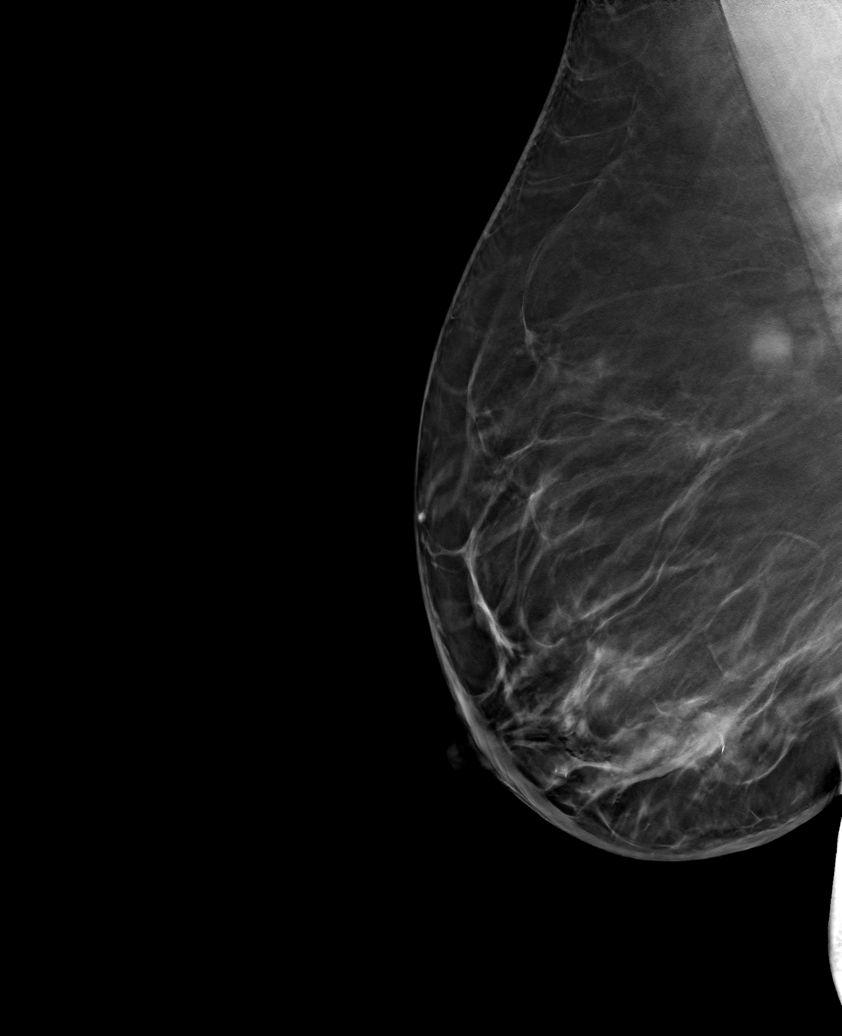

[6 of 18 positions shown; findings below may reference images not displayed]

FINDINGS: Mammographic images were obtained following ultrasound guided biopsy
of the 2 right breast masses and enlarged lymph node described
above. The biopsy marking clips are in expected positions at the
sites of biopsy.
IMPRESSION: Appropriate positioning of the twisted X shaped biopsy marking clip
at the site of biopsy in the 5:30 o'clock position of the right
breast, middle depth, Q shaped biopsy marker clip in the 5:30
o'clock retroareolar right breast and [employer] clip in the
biopsied 2.0 cm lymph node in the axillary tail/inferior axillary
region on the right.

Final Assessment: Post Procedure Mammograms for Marker Placement

## 2020-01-23 ENCOUNTER — Telehealth: Payer: Self-pay | Admitting: Oncology

## 2020-01-23 ENCOUNTER — Encounter: Payer: Self-pay | Admitting: Obstetrics and Gynecology

## 2020-01-23 ENCOUNTER — Encounter: Payer: Self-pay | Admitting: *Deleted

## 2020-01-23 NOTE — Telephone Encounter (Signed)
Received a new pt referral from Dr. Marlou Starks for new dx of breast cancer. Christy Ferrell has been cld and scheduled to see Dr. Jana Hakim on 9/15 at 4pm w/labs at 330pm. Pt aware to arrive 15 minutes early.

## 2020-01-23 NOTE — Progress Notes (Signed)
Called patient on Monday and offered navigation services.  States she would like to see Dr. Marlou Starks and Dr. Jana Hakim in [place].  Messages sent to [facility] and the nurse navigators at [facility] to assist with scheduling patient.  Spoke to patient again today to confirm her appointment.  Patient is scheduled to see Dr. Marlou Starks next week. She is to call if she has any needs or questions.

## 2020-01-24 ENCOUNTER — Encounter: Payer: Self-pay | Admitting: Oncology

## 2020-01-24 ENCOUNTER — Encounter: Payer: Self-pay | Admitting: Diagnostic Radiology

## 2020-01-24 LAB — SURGICAL PATHOLOGY

## 2020-01-28 ENCOUNTER — Other Ambulatory Visit: Payer: Self-pay | Admitting: *Deleted

## 2020-01-28 DIAGNOSIS — C50919 Malignant neoplasm of unspecified site of unspecified female breast: Secondary | ICD-10-CM

## 2020-01-29 ENCOUNTER — Inpatient Hospital Stay: Payer: No Typology Code available for payment source | Attending: Oncology | Admitting: Oncology

## 2020-01-29 ENCOUNTER — Inpatient Hospital Stay: Payer: No Typology Code available for payment source

## 2020-01-29 ENCOUNTER — Encounter: Payer: Self-pay | Admitting: *Deleted

## 2020-01-29 ENCOUNTER — Other Ambulatory Visit: Payer: Self-pay | Admitting: Genetic Counselor

## 2020-01-29 ENCOUNTER — Other Ambulatory Visit: Payer: Self-pay

## 2020-01-29 DIAGNOSIS — C50511 Malignant neoplasm of lower-outer quadrant of right female breast: Secondary | ICD-10-CM

## 2020-01-29 DIAGNOSIS — Z170 Estrogen receptor positive status [ER+]: Secondary | ICD-10-CM

## 2020-01-29 DIAGNOSIS — C50919 Malignant neoplasm of unspecified site of unspecified female breast: Secondary | ICD-10-CM

## 2020-01-29 LAB — CBC WITH DIFFERENTIAL (CANCER CENTER ONLY)
Abs Immature Granulocytes: 0.03 10*3/uL (ref 0.00–0.07)
Basophils Absolute: 0.1 10*3/uL (ref 0.0–0.1)
Basophils Relative: 0 %
Eosinophils Absolute: 0.1 10*3/uL (ref 0.0–0.5)
Eosinophils Relative: 1 %
HCT: 42.8 % (ref 36.0–46.0)
Hemoglobin: 14.7 g/dL (ref 12.0–15.0)
Immature Granulocytes: 0 %
Lymphocytes Relative: 40 %
Lymphs Abs: 4.5 10*3/uL — ABNORMAL HIGH (ref 0.7–4.0)
MCH: 30.3 pg (ref 26.0–34.0)
MCHC: 34.3 g/dL (ref 30.0–36.0)
MCV: 88.2 fL (ref 80.0–100.0)
Monocytes Absolute: 0.6 10*3/uL (ref 0.1–1.0)
Monocytes Relative: 5 %
Neutro Abs: 6 10*3/uL (ref 1.7–7.7)
Neutrophils Relative %: 54 %
Platelet Count: 306 10*3/uL (ref 150–400)
RBC: 4.85 MIL/uL (ref 3.87–5.11)
RDW: 13.2 % (ref 11.5–15.5)
WBC Count: 11.3 10*3/uL — ABNORMAL HIGH (ref 4.0–10.5)
nRBC: 0 % (ref 0.0–0.2)

## 2020-01-29 LAB — CMP (CANCER CENTER ONLY)
ALT: 31 U/L (ref 0–44)
AST: 22 U/L (ref 15–41)
Albumin: 4.1 g/dL (ref 3.5–5.0)
Alkaline Phosphatase: 78 U/L (ref 38–126)
Anion gap: 7 (ref 5–15)
BUN: 18 mg/dL (ref 6–20)
CO2: 29 mmol/L (ref 22–32)
Calcium: 10.3 mg/dL (ref 8.9–10.3)
Chloride: 104 mmol/L (ref 98–111)
Creatinine: 0.84 mg/dL (ref 0.44–1.00)
GFR, Est AFR Am: >60 mL/min (ref >60)
GFR, Estimated: >60 mL/min (ref >60)
Glucose, Bld: 125 mg/dL — ABNORMAL HIGH (ref 70–99)
Potassium: 3.8 mmol/L (ref 3.5–5.1)
Sodium: 140 mmol/L (ref 135–145)
Total Bilirubin: 0.5 mg/dL (ref 0.3–1.2)
Total Protein: 8.2 g/dL — ABNORMAL HIGH (ref 6.5–8.1)

## 2020-01-29 NOTE — Progress Notes (Signed)
[place]  [phone] [phone]     ID: Christy Ferrell DOB: 12-Nov-1970  MR#: [id number]  PPJ#:[id number]  Patient Care Team: [name], MD as PCP - General (Family Medicine) Mauro Kaufmann, RN as Oncology Nurse Navigator Rockwell Germany, RN as Oncology Nurse Navigator Jerzy Roepke, Virgie Dad, MD as Consulting Physician (Oncology) Jovita Kussmaul, MD as Consulting Physician (General Surgery) Chauncey Cruel, MD OTHER MD:  CHIEF COMPLAINT: Estrogen receptor positive breast cancer  CURRENT TREATMENT: Considering neoadjuvant treatment   HISTORY OF CURRENT ILLNESS: Christy Ferrell herself palpated a right breast mass. She underwent bilateral diagnostic mammography with tomography and right breast ultrasonography at [facility] on 01/15/2020 showing: breast density category B; palpable 1.6 cm mass in right breast at 5:30, with adjacent 5 mm probable satellite mass; 0.9 cm mass in retroareolar right breast, also at 5:30; two enlarged lymph nodes in right breast at 9:30.  Accordingly on 01/17/2020 she proceeded to biopsy of the right breast areas in question. The pathology from this procedure ([id number]) showed:  1. Right Breast, 5:30  - invasive mammary carcinoma, grade 2  - ductal carcinoma in situ, low grade.   - Prognostic indicators significant for: estrogen receptor, 95% positive and progesterone receptor, 95% positive, both with strong staining intensity. Proliferation marker Ki67 not obtained. HER2 negative by immunohistochemistry (0). 2. Right Breast, retroareolar  - ductal carcinoma in situ 3. Lymph Node, right axilla  - invasive mammary carcinoma  - lymph node tissue not identified  We do not have an e-cadherin or Ki67 on this tissue.  The patient's subsequent history is as detailed below.   INTERVAL HISTORY: Christy "Joydan Gretzinger" was evaluated in the breast cancer clinic on 01/29/2020 accompanied by her coursin Christy Ferrell  (who is also my patient). Her case was also presented at the multidisciplinary breast cancer conference on the same day. At that time a preliminary plan was proposed: consider neoadjuvant chemotherapy or endocrine therapy; breast MRI; genetics, mammaprint   REVIEW OF SYSTEMS: The patient denies unusual headaches, visual changes, nausea, vomiting, stiff neck, dizziness, or gait imbalance. There has been no cough, phlegm production, or pleurisy, no chest pain or pressure, and no change in bowel or bladder habits. The patient denies fever, rash, bleeding, unexplained fatigue or unexplained weight loss. A detailed review of systems was otherwise entirely negative.   PAST MEDICAL HISTORY: Past Medical History:  Diagnosis Date  . Anxiety    h/o, was prev on zoloft  . Breast cancer ([place]) 01/2020   seeing Dr. Marlou Starks  . Cholelithiasis   . Hypothyroidism    postpartum hypothyroidism  . IUD (intrauterine device) in place    mirena per [employer]  . Major depression     PAST SURGICAL HISTORY: Past Surgical History:  Procedure Laterality Date  . CESAREAN SECTION     X 2 Fetal stress, second scheduled  . CHOLECYSTECTOMY  05/1998    FAMILY HISTORY: Family History  Problem Relation Age of Onset  . Cancer Father 17       Prostate, S/P prostatectomy  . Hypertension Father   . Depression Father   . Asthma Sister   . Spina bifida Sister   . Hypothyroidism Mother   . Hypertension Maternal Grandmother   . Hyperlipidemia Maternal Grandmother   . Lung disease Maternal Grandmother   . Hypothyroidism Maternal Grandfather   . Emphysema Paternal Grandmother   . Diabetes Maternal Uncle   . Diabetes Maternal Uncle   .  Leukemia Maternal Uncle 76  . Breast cancer Other 45  . Stroke Neg Hx   . Colon cancer Neg Hx     The patient's father died at age 58 from prostate cancer.  The patient's mother is 68 years old as of September 2021.  The patient has 1 uncle with pancreatic cancer and one cousin  on the same side of the family with breast cancer.  GYNECOLOGIC HISTORY:  No LMP recorded. (Menstrual status: IUD). Menarche: 49 years old Age at first live birth: 49 years old [place] P 2 Contraceptive Mirena in place Hysterectomy?  No BSO?  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  She is divorced.  She lives alone although her significant other Christy Ferrell (who is disabled secondary to COPD) sometimes comes over.  Her sons are Aileen Pilot who is Chief Strategy Officer at [employer] and McBee who is also at [employer] considering dentistry or physical therapy  ADVANCED DIRECTIVES: Not in place.  The patient intends to name her older son as her healthcare power of attorney and the appropriate documents were giving her on 01/29/2020 for her to complete and notarized at her discretion   HEALTH MAINTENANCE: Social History   Tobacco Use  . Smoking status: Never Smoker  . Smokeless tobacco: Never Used  Vaping Use  . Vaping Use: Never used  Substance Use Topics  . Alcohol use: Yes    Alcohol/week: 0.0 standard drinks    Comment: Very rarely.  . Drug use: No     Colonoscopy:   PAP: Not up-to-date  Bone density: Never   Allergies  Allergen Reactions  . Codeine Nausea And Vomiting  . Zoloft [Sertraline Hcl]     headaches    Current Outpatient Medications  Medication Sig Dispense Refill  . acyclovir ointment (ZOVIRAX) 5 % Apply 1 application topically every 3 (three) hours. As needed. 5 g 5  . cyclobenzaprine (FLEXERIL) 10 MG tablet TAKE 1/2 TO 1 TABLET BY MOUTH EVERY DAY AS NEEDED 30 tablet 5  . escitalopram (LEXAPRO) 10 MG tablet Take 1 tablet (10 mg total) by mouth daily. 90 tablet 3  . ibuprofen (ADVIL,MOTRIN) 200 MG tablet Take 200 mg by mouth every 6 (six) hours as needed.    Marland Kitchen levonorgestrel (MIRENA) 20 MCG/24HR IUD 1 each by Intrauterine route once. 11/05/12    . levothyroxine (SYNTHROID) 25 MCG tablet  Take 1 tablet (25 mcg total) by mouth daily before breakfast. 90 tablet 3  . valACYclovir (VALTREX) 1000 MG tablet Take 2 tablets (2,000 mg total) by mouth 2 (two) times daily as needed. For 1 day. 20 tablet 5   No current facility-administered medications for this visit.    OBJECTIVE: White woman who appears stated age  37:   01/29/20 1545  BP: 125/78  Pulse: 79  Resp: 18  Temp: 97.7 F (36.5 C)  SpO2: 98%     Body mass index is 30.68 kg/m.   Wt Readings from Last 3 Encounters:  01/29/20 173 lb 3.2 oz (78.6 kg)  01/07/20 173 lb (78.5 kg)  12/03/19 174 lb 5 oz (79.1 kg)      ECOG FS:1 - Symptomatic but completely ambulatory  Ocular: Sclerae unicteric, pupils round and equal Ear-nose-throat: Wearing a mask Lymphatic: No cervical or supraclavicular adenopathy Lungs no rales or rhonchi Heart regular rate and rhythm Abd soft, nontender, positive bowel sounds MSK no focal spinal tenderness, no joint edema Neuro: non-focal, well-oriented,  appropriate affect Breasts: In the right breast lower outer quadrant there is a palpable mass which is movable, not associated with skin changes or nipple retraction.  I do not palpate a periareolar mass.  I do not palpate any axillary masses.   LAB RESULTS:  CMP     Component Value Date/Time   NA 140 01/29/2020 1526   K 3.8 01/29/2020 1526   CL 104 01/29/2020 1526   CO2 29 01/29/2020 1526   GLUCOSE 125 (H) 01/29/2020 1526   BUN 18 01/29/2020 1526   CREATININE 0.84 01/29/2020 1526   CALCIUM 10.3 01/29/2020 1526   PROT 8.2 (H) 01/29/2020 1526   ALBUMIN 4.1 01/29/2020 1526   AST 22 01/29/2020 1526   ALT 31 01/29/2020 1526   ALKPHOS 78 01/29/2020 1526   BILITOT 0.5 01/29/2020 1526   GFRNONAA >60 01/29/2020 1526   GFRAA >60 01/29/2020 1526    No results found for: TOTALPROTELP, ALBUMINELP, A1GS, A2GS, BETS, BETA2SER, GAMS, MSPIKE, SPEI  Lab Results  Component Value Date   WBC 11.3 (H) 01/29/2020   NEUTROABS 6.0 01/29/2020     HGB 14.7 01/29/2020   HCT 42.8 01/29/2020   MCV 88.2 01/29/2020   PLT 306 01/29/2020    No results found for: LABCA2  No components found for: YIFOYD741  No results for input(s): INR in the last 168 hours.  No results found for: LABCA2  No results found for: OIN867  No results found for: EHM094  No results found for: BSJ628  No results found for: CA2729  No components found for: HGQUANT  No results found for: CEA1 / No results found for: CEA1   No results found for: AFPTUMOR  No results found for: CHROMOGRNA  No results found for: KPAFRELGTCHN, LAMBDASER, KAPLAMBRATIO (kappa/lambda light chains)  No results found for: HGBA, HGBA2QUANT, HGBFQUANT, HGBSQUAN (Hemoglobinopathy evaluation)   No results found for: LDH  No results found for: IRON, TIBC, IRONPCTSAT (Iron and TIBC)  No results found for: FERRITIN  Urinalysis    Component Value Date/Time   BILIRUBINUR negative 02/25/2015 1645   PROTEINUR + 0.15 02/25/2015 1645   UROBILINOGEN 0.2 02/25/2015 1645   NITRITE negative 02/25/2015 1645   LEUKOCYTESUR small (1+) (A) 02/25/2015 1645     STUDIES: US BREAST LTD UNI RIGHT INC AXILLA  Result Date: 01/15/2020 CLINICAL DATA:  Patient complains of a palpable right breast mass. EXAM: DIGITAL DIAGNOSTIC BILATERAL MAMMOGRAM WITH CAD AND TOMO ULTRASOUND RIGHT BREAST COMPARISON:  Previous exam(s). ACR Breast Density Category b: There are scattered areas of fibroglandular density. FINDINGS: No suspicious mass or malignant type microcalcifications identified in the left breast. There is a 1.4 cm spiculated mass in the lower-inner quadrant of the right breast and a 1.2 cm mass in the upper outer quadrant of the right breast (palpable mass). There are no malignant type microcalcifications. Mammographic images were processed with CAD. On physical exam, I palpate a mass in the right breast at 9:30 11 cm from the nipple. I palpate thickening in the right breast at 5:30 1-2 cm  from the nipple. Targeted ultrasound is performed, showing an enlarged lymph node at 9:30 11 cm from the nipple accounting for the palpable abnormality measuring 1.0 x 1.1 x 1.0 cm. 1.6 cm lateral is a larger abnormal lymph node measuring 2.0 x 1.3 x 0.9 cm. There is a third benign appearing lymph node 6 x 5 x 7 mm. Sonographic evaluation higher in the right axilla does not show any additional enlarged adenopathy. There is  an irregular hypoechoic mass in the right breast at 5:30 1-2 cm from the nipple measuring 1.6 x 1.1 x 1.3 cm. There is an adjacent 5 mm hypoechoic mass that is likely a satellite nodule. There is an irregular hypoechoic mass in the right breast at 5:30 in the retroareolar region measuring 0.6 x 0.6 x 0.9 cm. This lesion is located 1.6 cm from the larger mass. IMPRESSION: 1. Suspicious mass in the 5:30 region of the right breast 1-2 cm from the nipple with adjacent 5 mm probable satellite mass. 2. Suspicious mass in the 5:30 retroareolar region of the right breast. 3. Two enlarged lymph nodes in the right breast at 9:30 11 cm from the nipple. RECOMMENDATION: 1. Three ultrasound-guided core biopsies is recommended. Ultrasound-guided core biopsies of the mass in the right breast at 5:30 1 to 2 cm from the nipple, mass at in the 530 region of the right breast in the retroareolar region and the larger of the 2 lymph nodes at 9:30 11 cm from the nipple is recommended. 2. The masses and lymph nodes are highly concerning for invasive mammary carcinoma with metastatic adenopathy. There is likely at least 1 satellite mass adjacent to the mass at 5:30 1 to 2 cm from the nipple. Further evaluation with MRI is recommended. I have discussed the findings and recommendations with the patient. If applicable, a reminder letter will be sent to the patient regarding the next appointment. BI-RADS CATEGORY  5: Highly suggestive of malignancy. Electronically Signed   By: Lillia Mountain M.D.   On: 01/15/2020 12:35   MM  DIAG BREAST TOMO BILATERAL  Result Date: 01/15/2020 CLINICAL DATA:  Patient complains of a palpable right breast mass. EXAM: DIGITAL DIAGNOSTIC BILATERAL MAMMOGRAM WITH CAD AND TOMO ULTRASOUND RIGHT BREAST COMPARISON:  Previous exam(s). ACR Breast Density Category b: There are scattered areas of fibroglandular density. FINDINGS: No suspicious mass or malignant type microcalcifications identified in the left breast. There is a 1.4 cm spiculated mass in the lower-inner quadrant of the right breast and a 1.2 cm mass in the upper outer quadrant of the right breast (palpable mass). There are no malignant type microcalcifications. Mammographic images were processed with CAD. On physical exam, I palpate a mass in the right breast at 9:30 11 cm from the nipple. I palpate thickening in the right breast at 5:30 1-2 cm from the nipple. Targeted ultrasound is performed, showing an enlarged lymph node at 9:30 11 cm from the nipple accounting for the palpable abnormality measuring 1.0 x 1.1 x 1.0 cm. 1.6 cm lateral is a larger abnormal lymph node measuring 2.0 x 1.3 x 0.9 cm. There is a third benign appearing lymph node 6 x 5 x 7 mm. Sonographic evaluation higher in the right axilla does not show any additional enlarged adenopathy. There is an irregular hypoechoic mass in the right breast at 5:30 1-2 cm from the nipple measuring 1.6 x 1.1 x 1.3 cm. There is an adjacent 5 mm hypoechoic mass that is likely a satellite nodule. There is an irregular hypoechoic mass in the right breast at 5:30 in the retroareolar region measuring 0.6 x 0.6 x 0.9 cm. This lesion is located 1.6 cm from the larger mass. IMPRESSION: 1. Suspicious mass in the 5:30 region of the right breast 1-2 cm from the nipple with adjacent 5 mm probable satellite mass. 2. Suspicious mass in the 5:30 retroareolar region of the right breast. 3. Two enlarged lymph nodes in the right breast at 9:30 11 cm  from the nipple. RECOMMENDATION: 1. Three ultrasound-guided core  biopsies is recommended. Ultrasound-guided core biopsies of the mass in the right breast at 5:30 1 to 2 cm from the nipple, mass at in the 530 region of the right breast in the retroareolar region and the larger of the 2 lymph nodes at 9:30 11 cm from the nipple is recommended. 2. The masses and lymph nodes are highly concerning for invasive mammary carcinoma with metastatic adenopathy. There is likely at least 1 satellite mass adjacent to the mass at 5:30 1 to 2 cm from the nipple. Further evaluation with MRI is recommended. I have discussed the findings and recommendations with the patient. If applicable, a reminder letter will be sent to the patient regarding the next appointment. BI-RADS CATEGORY  5: Highly suggestive of malignancy. Electronically Signed   By: Baird Lyons M.D.   On: 01/15/2020 12:35   MM CLIP PLACEMENT RIGHT  Result Date: 01/17/2020 CLINICAL DATA:  Status post ultrasound-guided core needle biopsies of a 1.6 cm mass in the 5:30 o'clock position of the right breast, 1-2 cm from the nipple, 0.9 cm mass in 5:30 o'clock retroareolar right breast and 2.0 cm enlarged lymph node in the 9:30 o'clock position of the right breast, 11 cm from the nipple, in the axillary tail region. EXAM: DIAGNOSTIC RIGHT MAMMOGRAM POST ULTRASOUND BIOPSY X 3 COMPARISON:  Previous exam(s). FINDINGS: Mammographic images were obtained following ultrasound guided biopsy of the 2 right breast masses and enlarged lymph node described above. The biopsy marking clips are in expected positions at the sites of biopsy. IMPRESSION: Appropriate positioning of the twisted X shaped biopsy marking clip at the site of biopsy in the 5:30 o'clock position of the right breast, middle depth, Q shaped biopsy marker clip in the 5:30 o'clock retroareolar right breast and [employer] clip in the biopsied 2.0 cm lymph node in the axillary tail/inferior axillary region on the right. Final Assessment: Post Procedure Mammograms for Marker  Placement Electronically Signed   By: Beckie Salts M.D.   On: 01/17/2020 12:01   [place] RT BREAST BX W LOC DEV 1ST LESION IMG BX SPEC US GUIDE  Addendum Date: 01/22/2020   ADDENDUM REPORT: 01/22/2020 13:42 ADDENDUM: Recommendation: Consider bilateral breast MRI to evaluate extent of breast disease. Additional recommendation prepared by Randa Lynn RN on 01/22/2020. Electronically Signed   By: Beckie Salts M.D.   On: 01/22/2020 13:42   Addendum Date: 01/22/2020   ADDENDUM REPORT: 01/22/2020 13:21 ADDENDUM: PATHOLOGY revealed: Site A. RIGHT BREAST, 5:30 2CMFN; ULTRASOUND-GUIDED BIOPSY: - INVASIVE MAMMARY CARCINOMA, NO SPECIAL TYPE. 12 mm in this sample. Grade 2. Ductal carcinoma in situ: Present, low-grade. Lymphovascular invasion: Not identified. Pathology results are CONCORDANT with imaging findings, per Dr. Beckie Salts PATHOLOGY revealed: Site B. RIGHT BREAST, RETROAREOLAR; ULTRASOUND-GUIDED BIOPSY: - DUCTAL CARCINOMA IN SITU. - NEGATIVE FOR INVASIVE CARCINOMA. Pathology results are CONCORDANT with imaging findings, per Dr. Beckie Salts. PATHOLOGY revealed: Site C. AXILLARY LYMPH NODE, 9:30 11 CMFN; ULTRASOUND-GUIDED BIOPSY: - INVASIVE MAMMARY CARCINOMA, AS DESCRIBED ABOVE (SEE PART A). - LYMPH NODE TISSUE IS NOT IDENTIFIED IN THIS SAMPLE. Pathology results are CONCORDANT with imaging findings, per Dr. Beckie Salts. Pathology results and recommendations below were discussed with patient by telephone on 01/21/2020. Patient reported biopsy site within normal limits with slight tenderness at the site. Post biopsy care instructions were reviewed, questions were answered and my direct phone number was provided to patient. Patient was instructed to call [facility] if any concerns or  questions arise related to the biopsy. Recommendation: Surgical consultation for all three biopsied sites listed above. Request for surgical consultation was relayed to [place] and Tanya Nones RN at [facility] by Electa Sniff RN on 01/21/2020. Pathology results reported by Electa Sniff RN on 01/22/2020. Electronically Signed   By: Claudie Revering M.D.   On: 01/22/2020 13:21   Result Date: 01/22/2020 CLINICAL DATA:  1.6 cm mass in the 5:30 o'clock position of the right breast, 1-2 cm from the nipple, with imaging features highly suspicious for malignancy at recent mammography and ultrasound. There was also a 0.9 cm mass highly suspicious for malignancy in the 5:30 o'clock retroareolar right breast. There are also 3 abnormal appearing enlarged intramammary lymph nodes in the 9:30 o'clock position of the right breast, 11 cm from the nipple, measuring up to 2.0 cm in maximum diameter each. EXAM: ULTRASOUND GUIDED RIGHT BREAST CORE NEEDLE BIOPSY X 3 COMPARISON:  Previous exam(s). PROCEDURE: I met with the patient and we discussed the procedure of ultrasound-guided biopsy, including benefits and alternatives. We discussed the high likelihood of a successful procedure. We discussed the risks of the procedure, including infection, bleeding, tissue injury, clip migration, and inadequate sampling. Informed written consent was given. The usual time-out protocol was performed immediately prior to the procedure. SITE 1: 1.6 CM MASS IN THE 5:30 O'CLOCK POSITION OF THE RIGHT BREAST, 1-2 CM FROM THE NIPPLE Lesion quadrant: Lower inner quadrant Using sterile technique and 1% Lidocaine as local anesthetic, under direct ultrasound visualization, a 12 gauge spring-loaded device was used to perform biopsy of the recently demonstrated 1.6 cm mass in the 5:30 o'clock position of the right breast, 1-2 cm from the nipple, using a caudal approach. At the conclusion of the procedure a twisted X shaped tissue marker clip was deployed into the biopsy cavity. SITE 2: 0.9 CM MASS IN THE 5:30 O'CLOCK RETROAREOLAR RIGHT BREAST Lesion quadrant: Lower inner quadrant Using sterile technique and 1% Lidocaine as local anesthetic, under direct ultrasound  visualization, a 12 gauge spring-loaded device was used to perform biopsy of the recently demonstrated 0.9 cm mass in the 5:30 o'clock retroareolar right breast using a caudal approach. At the conclusion of the procedure a Q shaped tissue marker clip was deployed into the biopsy cavity. SITE 3: 2.0 CM LYMPH NODE IN THE 9:30 O'CLOCK POSITION OF THE RIGHT BREAST Lesion quadrant: Upper outer quadrant Using sterile technique and 1% Lidocaine as local anesthetic, under direct ultrasound visualization, a 12 gauge spring-loaded device was used to perform biopsy of the recently demonstrated 2.0 cm lymph node in the 9:30 o'clock position of the right breast, 11 cm from the nipple, using a caudal approach. At the conclusion of the procedure a [employer] tissue marker clip was deployed into the biopsy cavity. Follow up 2 view mammogram was performed and dictated separately. IMPRESSION: Ultrasound guided biopsy of a 1.6 cm mass in the 5:30 o'clock position of the right breast, 1-2 cm from the nipple, 0.9 cm mass in the 5:30 o'clock retroareolar right breast and 2.0 cm lymph node in the 9:30 o'clock position of the right breast. No apparent complications. Electronically Signed: By: Claudie Revering M.D. On: 01/17/2020 11:56   [place] RT BREAST BX W LOC DEV EA ADD LESION IMG BX SPEC US GUIDE  Addendum Date: 01/22/2020   ADDENDUM REPORT: 01/22/2020 13:42 ADDENDUM: Recommendation: Consider bilateral breast MRI to evaluate extent of breast disease. Additional recommendation prepared by Electa Sniff RN on 01/22/2020.  Electronically Signed   By: Claudie Revering M.D.   On: 01/22/2020 13:42   Addendum Date: 01/22/2020   ADDENDUM REPORT: 01/22/2020 13:21 ADDENDUM: PATHOLOGY revealed: Site A. RIGHT BREAST, 5:30 2CMFN; ULTRASOUND-GUIDED BIOPSY: - INVASIVE MAMMARY CARCINOMA, NO SPECIAL TYPE. 12 mm in this sample. Grade 2. Ductal carcinoma in situ: Present, low-grade. Lymphovascular invasion: Not identified. Pathology results are CONCORDANT with  imaging findings, per Dr. Claudie Revering PATHOLOGY revealed: Site B. RIGHT BREAST, RETROAREOLAR; ULTRASOUND-GUIDED BIOPSY: - DUCTAL CARCINOMA IN SITU. - NEGATIVE FOR INVASIVE CARCINOMA. Pathology results are CONCORDANT with imaging findings, per Dr. Claudie Revering. PATHOLOGY revealed: Site C. AXILLARY LYMPH NODE, 9:30 11 CMFN; ULTRASOUND-GUIDED BIOPSY: - INVASIVE MAMMARY CARCINOMA, AS DESCRIBED ABOVE (SEE PART A). - LYMPH NODE TISSUE IS NOT IDENTIFIED IN THIS SAMPLE. Pathology results are CONCORDANT with imaging findings, per Dr. Claudie Revering. Pathology results and recommendations below were discussed with patient by telephone on 01/21/2020. Patient reported biopsy site within normal limits with slight tenderness at the site. Post biopsy care instructions were reviewed, questions were answered and my direct phone number was provided to patient. Patient was instructed to call [facility] if any concerns or questions arise related to the biopsy. Recommendation: Surgical consultation for all three biopsied sites listed above. Request for surgical consultation was relayed to [place] and Tanya Nones RN at [facility] by Electa Sniff RN on 01/21/2020. Pathology results reported by Electa Sniff RN on 01/22/2020. Electronically Signed   By: Claudie Revering M.D.   On: 01/22/2020 13:21   Result Date: 01/22/2020 CLINICAL DATA:  1.6 cm mass in the 5:30 o'clock position of the right breast, 1-2 cm from the nipple, with imaging features highly suspicious for malignancy at recent mammography and ultrasound. There was also a 0.9 cm mass highly suspicious for malignancy in the 5:30 o'clock retroareolar right breast. There are also 3 abnormal appearing enlarged intramammary lymph nodes in the 9:30 o'clock position of the right breast, 11 cm from the nipple, measuring up to 2.0 cm in maximum diameter each. EXAM: ULTRASOUND GUIDED RIGHT BREAST CORE NEEDLE BIOPSY X 3 COMPARISON:  Previous exam(s). PROCEDURE: I met  with the patient and we discussed the procedure of ultrasound-guided biopsy, including benefits and alternatives. We discussed the high likelihood of a successful procedure. We discussed the risks of the procedure, including infection, bleeding, tissue injury, clip migration, and inadequate sampling. Informed written consent was given. The usual time-out protocol was performed immediately prior to the procedure. SITE 1: 1.6 CM MASS IN THE 5:30 O'CLOCK POSITION OF THE RIGHT BREAST, 1-2 CM FROM THE NIPPLE Lesion quadrant: Lower inner quadrant Using sterile technique and 1% Lidocaine as local anesthetic, under direct ultrasound visualization, a 12 gauge spring-loaded device was used to perform biopsy of the recently demonstrated 1.6 cm mass in the 5:30 o'clock position of the right breast, 1-2 cm from the nipple, using a caudal approach. At the conclusion of the procedure a twisted X shaped tissue marker clip was deployed into the biopsy cavity. SITE 2: 0.9 CM MASS IN THE 5:30 O'CLOCK RETROAREOLAR RIGHT BREAST Lesion quadrant: Lower inner quadrant Using sterile technique and 1% Lidocaine as local anesthetic, under direct ultrasound visualization, a 12 gauge spring-loaded device was used to perform biopsy of the recently demonstrated 0.9 cm mass in the 5:30 o'clock retroareolar right breast using a caudal approach. At the conclusion of the procedure a Q shaped tissue marker clip was deployed into the biopsy cavity. SITE 3: 2.0 CM LYMPH  NODE IN THE 9:30 O'CLOCK POSITION OF THE RIGHT BREAST Lesion quadrant: Upper outer quadrant Using sterile technique and 1% Lidocaine as local anesthetic, under direct ultrasound visualization, a 12 gauge spring-loaded device was used to perform biopsy of the recently demonstrated 2.0 cm lymph node in the 9:30 o'clock position of the right breast, 11 cm from the nipple, using a caudal approach. At the conclusion of the procedure a [employer] tissue marker clip was deployed into the  biopsy cavity. Follow up 2 view mammogram was performed and dictated separately. IMPRESSION: Ultrasound guided biopsy of a 1.6 cm mass in the 5:30 o'clock position of the right breast, 1-2 cm from the nipple, 0.9 cm mass in the 5:30 o'clock retroareolar right breast and 2.0 cm lymph node in the 9:30 o'clock position of the right breast. No apparent complications. Electronically Signed: By: Claudie Revering M.D. On: 01/17/2020 11:56   [place] RT BREAST BX W LOC DEV EA ADD LESION IMG BX SPEC US GUIDE  Addendum Date: 01/22/2020   ADDENDUM REPORT: 01/22/2020 13:42 ADDENDUM: Recommendation: Consider bilateral breast MRI to evaluate extent of breast disease. Additional recommendation prepared by Electa Sniff RN on 01/22/2020. Electronically Signed   By: Claudie Revering M.D.   On: 01/22/2020 13:42   Addendum Date: 01/22/2020   ADDENDUM REPORT: 01/22/2020 13:21 ADDENDUM: PATHOLOGY revealed: Site A. RIGHT BREAST, 5:30 2CMFN; ULTRASOUND-GUIDED BIOPSY: - INVASIVE MAMMARY CARCINOMA, NO SPECIAL TYPE. 12 mm in this sample. Grade 2. Ductal carcinoma in situ: Present, low-grade. Lymphovascular invasion: Not identified. Pathology results are CONCORDANT with imaging findings, per Dr. Claudie Revering PATHOLOGY revealed: Site B. RIGHT BREAST, RETROAREOLAR; ULTRASOUND-GUIDED BIOPSY: - DUCTAL CARCINOMA IN SITU. - NEGATIVE FOR INVASIVE CARCINOMA. Pathology results are CONCORDANT with imaging findings, per Dr. Claudie Revering. PATHOLOGY revealed: Site C. AXILLARY LYMPH NODE, 9:30 11 CMFN; ULTRASOUND-GUIDED BIOPSY: - INVASIVE MAMMARY CARCINOMA, AS DESCRIBED ABOVE (SEE PART A). - LYMPH NODE TISSUE IS NOT IDENTIFIED IN THIS SAMPLE. Pathology results are CONCORDANT with imaging findings, per Dr. Claudie Revering. Pathology results and recommendations below were discussed with patient by telephone on 01/21/2020. Patient reported biopsy site within normal limits with slight tenderness at the site. Post biopsy care instructions were reviewed, questions were answered and my  direct phone number was provided to patient. Patient was instructed to call [facility] if any concerns or questions arise related to the biopsy. Recommendation: Surgical consultation for all three biopsied sites listed above. Request for surgical consultation was relayed to Newman and Tanya Nones RN at [facility] by Electa Sniff RN on 01/21/2020. Pathology results reported by Electa Sniff RN on 01/22/2020. Electronically Signed   By: Claudie Revering M.D.   On: 01/22/2020 13:21   Result Date: 01/22/2020 CLINICAL DATA:  1.6 cm mass in the 5:30 o'clock position of the right breast, 1-2 cm from the nipple, with imaging features highly suspicious for malignancy at recent mammography and ultrasound. There was also a 0.9 cm mass highly suspicious for malignancy in the 5:30 o'clock retroareolar right breast. There are also 3 abnormal appearing enlarged intramammary lymph nodes in the 9:30 o'clock position of the right breast, 11 cm from the nipple, measuring up to 2.0 cm in maximum diameter each. EXAM: ULTRASOUND GUIDED RIGHT BREAST CORE NEEDLE BIOPSY X 3 COMPARISON:  Previous exam(s). PROCEDURE: I met with the patient and we discussed the procedure of ultrasound-guided biopsy, including benefits and alternatives. We discussed the high likelihood of a successful procedure. We discussed the risks of  the procedure, including infection, bleeding, tissue injury, clip migration, and inadequate sampling. Informed written consent was given. The usual time-out protocol was performed immediately prior to the procedure. SITE 1: 1.6 CM MASS IN THE 5:30 O'CLOCK POSITION OF THE RIGHT BREAST, 1-2 CM FROM THE NIPPLE Lesion quadrant: Lower inner quadrant Using sterile technique and 1% Lidocaine as local anesthetic, under direct ultrasound visualization, a 12 gauge spring-loaded device was used to perform biopsy of the recently demonstrated 1.6 cm mass in the 5:30 o'clock position of the right breast, 1-2  cm from the nipple, using a caudal approach. At the conclusion of the procedure a twisted X shaped tissue marker clip was deployed into the biopsy cavity. SITE 2: 0.9 CM MASS IN THE 5:30 O'CLOCK RETROAREOLAR RIGHT BREAST Lesion quadrant: Lower inner quadrant Using sterile technique and 1% Lidocaine as local anesthetic, under direct ultrasound visualization, a 12 gauge spring-loaded device was used to perform biopsy of the recently demonstrated 0.9 cm mass in the 5:30 o'clock retroareolar right breast using a caudal approach. At the conclusion of the procedure a Q shaped tissue marker clip was deployed into the biopsy cavity. SITE 3: 2.0 CM LYMPH NODE IN THE 9:30 O'CLOCK POSITION OF THE RIGHT BREAST Lesion quadrant: Upper outer quadrant Using sterile technique and 1% Lidocaine as local anesthetic, under direct ultrasound visualization, a 12 gauge spring-loaded device was used to perform biopsy of the recently demonstrated 2.0 cm lymph node in the 9:30 o'clock position of the right breast, 11 cm from the nipple, using a caudal approach. At the conclusion of the procedure a [employer] tissue marker clip was deployed into the biopsy cavity. Follow up 2 view mammogram was performed and dictated separately. IMPRESSION: Ultrasound guided biopsy of a 1.6 cm mass in the 5:30 o'clock position of the right breast, 1-2 cm from the nipple, 0.9 cm mass in the 5:30 o'clock retroareolar right breast and 2.0 cm lymph node in the 9:30 o'clock position of the right breast. No apparent complications. Electronically Signed: By: Claudie Revering M.D. On: 01/17/2020 11:56     ELIGIBLE FOR AVAILABLE RESEARCH PROTOCOL: AET  ASSESSMENT: 49 y.o. [place] woman status post right breast lower outer quadrant biopsy 01/17/2020  for a clinical T1c N1, stage IB invasive mammary carcinoma, grade 2, estrogen and progesterone receptor positive, HER-2 not amplified  (a) biopsy of the left axillary "lymph node" included no lymph node  tissue  (b) biopsy of a suspicious periareoral area was positive for ductal carcinoma in situ, grade 1  (1) genetics testing pending  (2) MammaPrint to be obtained from the original biopsy  (3) consider neoadjuvant systemic treatment  (4) definitive surgery pending  (5) adjuvant radiation   PLAN: I met today with Draven to review her new diagnosis. Specifically we discussed the biology of her breast cancer, its diagnosis, staging, treatment  options and prognosis. We first reviewed the fact that cancer is not one disease but more than 100 different diseases and that it is important to keep them separate-- otherwise when friends and relatives discuss their own cancer experiences with Myiah confusion can result. Similarly we explained that if breast cancer spreads to the bone or liver, the patient would not have bone cancer or liver cancer, but breast cancer in the bone and breast cancer in the liver: one cancer in three places-- not 3 different cancers which otherwise would have to be treated in 3 different ways.  We discussed the difference between local and systemic therapy. In terms of  loco-regional treatment, lumpectomy plus radiation is equivalent to mastectomy as far as survival is concerned.  Whether or not the patient is a candidate for lumpectomy will depend on further review of the mammography and breast MRI, which is pending  We also noted that in terms of sequencing of treatments, whether systemic therapy or surgery is done first does not affect the ultimate outcome.  In this case neoadjuvant systemic treatment may be useful to shrink the tumor preop if the hope is to salvage the breast  We then discussed the rationale for systemic therapy. There is some risk that this cancer may have already spread to other parts of her body.  We are going to obtain a CT of the chest and a bone scan in this case but even if negative that does not mean there is not microscopic systemic disease and  this needs to be addressed with systemic treatment.  Next we went over the options for systemic therapy which are anti-estrogens, anti-HER-2 immunotherapy, and chemotherapy. Rechelle does not meet criteria for anti-HER-2 immunotherapy. She is a good candidate for anti-estrogens.  The question of chemotherapy is more complicated. Chemotherapy is most effective in rapidly growing, aggressive tumors. It is much less effective in low-grade, slow growing cancers.  Blu's cancer appears to be intermediate to these extremes and for that reason we are going to request a MammaPrint test from the original biopsy, to help [place] decide whether chemotherapy is warranted in this case  Ghada also meets criteria for genetics testing. In patients who carry a deleterious mutation [for example in a  BRCA gene], the risk of a new breast cancer developing in the future may be sufficiently great that the patient may choose bilateral mastectomies. However if she wishes to keep her breasts in that situation it is safe to do so. That would require intensified screening, which generally means not only yearly mammography but a yearly breast MRI as well.   Pecola has a good understanding of the overall plan. She knows the goal of treatment in her case is cure. She will call with any problems that may develop before her next visit here.  Total encounter time 65 minutes.   Virgie Dad. Elantra Caprara, MD 01/29/2020 4:47 PM Medical Oncology and Hematology [facility] [place], [place] [address] Tel. [phone]    Fax. [phone]   This document serves as a record of services personally performed by Lurline Del, MD. It was created on his behalf by Wilburn Mylar, a trained medical scribe. The creation of this record is based on the scribe's personal observations and the provider's statements to them.   I, Lurline Del MD, have reviewed the above documentation for accuracy and completeness, and I  agree with the above.    *Total Encounter Time as defined by the Centers for Medicare and Medicaid Services includes, in addition to the face-to-face time of a patient visit (documented in the note above) non-face-to-face time: obtaining and reviewing outside history, ordering and reviewing medications, tests or procedures, care coordination (communications with other health care professionals or caregivers) and documentation in the medical record.

## 2020-01-30 ENCOUNTER — Telehealth: Payer: Self-pay | Admitting: *Deleted

## 2020-01-30 ENCOUNTER — Telehealth: Payer: Self-pay | Admitting: Oncology

## 2020-01-30 ENCOUNTER — Encounter: Payer: Self-pay | Admitting: *Deleted

## 2020-01-30 NOTE — Telephone Encounter (Signed)
Called pt to provide navigation resources and contact information. Pt denies questions or concerns regarding dx or treatment care plan. Informed pt will call with appts for staging scans. Denies further questions or needs at this time.  Received order for mammaprint testing on core bx. Requisition faxed to [facility] and [facility].

## 2020-01-30 NOTE — Telephone Encounter (Signed)
Scheduled appts per 9/16 los. Pt confirmed appt date and time.

## 2020-01-31 ENCOUNTER — Other Ambulatory Visit: Payer: Self-pay

## 2020-01-31 ENCOUNTER — Inpatient Hospital Stay: Payer: No Typology Code available for payment source

## 2020-01-31 DIAGNOSIS — C50511 Malignant neoplasm of lower-outer quadrant of right female breast: Secondary | ICD-10-CM

## 2020-02-03 ENCOUNTER — Encounter: Payer: Self-pay | Admitting: *Deleted

## 2020-02-03 LAB — GENETIC SCREENING ORDER

## 2020-02-05 ENCOUNTER — Ambulatory Visit: Payer: No Typology Code available for payment source | Attending: General Surgery | Admitting: Rehabilitation

## 2020-02-05 ENCOUNTER — Encounter: Payer: Self-pay | Admitting: Rehabilitation

## 2020-02-05 ENCOUNTER — Other Ambulatory Visit: Payer: Self-pay

## 2020-02-05 DIAGNOSIS — D4861 Neoplasm of uncertain behavior of right breast: Secondary | ICD-10-CM | POA: Diagnosis present

## 2020-02-05 DIAGNOSIS — R293 Abnormal posture: Secondary | ICD-10-CM

## 2020-02-05 NOTE — Therapy (Signed)
[place], [place], [address] Phone: [phone]   Fax:  [phone]  Physical Therapy Evaluation  Patient Details  Name: Christy Ferrell MRN: [id number] Date of Birth: 1971-02-27 Referring Provider (PT): Dr. Marlou Starks   Encounter Date: 02/05/2020   PT End of Session - 02/05/20 1137    Visit Number 1    Number of Visits 2    Date for PT Re-Evaluation 04/01/20    PT Start Time 1103    PT Stop Time 1130    PT Time Calculation (min) 27 min    Activity Tolerance Patient tolerated treatment well    Behavior During Therapy [facility] for tasks assessed/performed           Past Medical History:  Diagnosis Date  . Anxiety    h/o, was prev on zoloft  . Breast cancer ([place]) 01/2020   seeing Dr. Marlou Starks  . Cholelithiasis   . Hypothyroidism    postpartum hypothyroidism  . IUD (intrauterine device) in place    mirena per [employer]  . Major depression     Past Surgical History:  Procedure Laterality Date  . CESAREAN SECTION     X 2 Fetal stress, second scheduled  . CHOLECYSTECTOMY  05/1998    There were no vitals filed for this visit.    Subjective Assessment - 02/05/20 1107    Subjective I am ready to go    Pertinent History Rt invasive breast cancer lower inner quadrant and DCIS with future mastecomy or lumpectomy with SLNB and radiation pending.  unknown chemotherapy.  hypothyroidism, anxiety.    Patient Stated Goals getting information from providers    Currently in Pain? No/denies              [facility] PT Assessment - 02/05/20 0001      Assessment   Medical Diagnosis Rt breast cancer    Referring Provider (PT) Dr. Ellwood Dense Dominance Right    Prior Therapy no      Precautions   Precaution Comments none      Balance Screen   Has the patient fallen in the past 6 months No    Has the patient had a decrease in activity level because of a fear of falling?  No    Is the patient reluctant  to leave their home because of a fear of falling?  No      Home Environment   Living Environment Private residence    Living Arrangements Alone    Available Help at Discharge Family      Prior Function   Level of Independence Independent    Vocation Full time employment    Vocation Requirements computer job    Leisure walking       Cognition   Overall Cognitive Status Within Functional Limits for tasks assessed      Coordination   Gross Motor Movements are Fluid and Coordinated Yes      Posture/Postural Control   Posture/Postural Control Postural limitations    Postural Limitations Rounded Shoulders;Forward head      ROM / Strength   AROM / PROM / Strength AROM      AROM   AROM Assessment Site Shoulder    Right/Left Shoulder Right;Left    Right Shoulder Extension 55 Degrees    Right Shoulder Flexion 171 Degrees    Right Shoulder ABduction 170 Degrees    Right Shoulder Internal Rotation 65 Degrees    Right  Shoulder External Rotation 100 Degrees    Right Shoulder Horizontal ABduction 40 Degrees             LYMPHEDEMA/ONCOLOGY QUESTIONNAIRE - 02/05/20 0001      Lymphedema Assessments   Lymphedema Assessments Upper extremities      Right Upper Extremity Lymphedema   10 cm Proximal to Olecranon Process 32 cm    Olecranon Process 25.5 cm    10 cm Proximal to Ulnar Styloid Process 22.5 cm    Just Proximal to Ulnar Styloid Process 16.3 cm    Across Hand at [employer] 19.7 cm    At [place] of 2nd Digit 5.9 cm      Left Upper Extremity Lymphedema   10 cm Proximal to Olecranon Process 30.4 cm    Olecranon Process 25.7 cm    10 cm Proximal to Ulnar Styloid Process 21.5 cm    Just Proximal to Ulnar Styloid Process 15.9 cm    Across Hand at [employer] 20.2 cm    At [place] of 2nd Digit 6.1 cm           L-DEX FLOWSHEETS - 02/05/20 1100      L-DEX LYMPHEDEMA SCREENING   Measurement Type Unilateral    L-DEX MEASUREMENT EXTREMITY Upper Extremity    POSITION   Standing    DOMINANT SIDE Right    At Risk Side Right    BASELINE SCORE (UNILATERAL) 1.9                Quick Dash - 02/05/20 0001    Open a tight or new jar No difficulty    Do heavy household chores (wash walls, wash floors) No difficulty    Carry a shopping bag or briefcase No difficulty    Wash your back No difficulty    Use a knife to cut food No difficulty    Recreational activities in which you take some force or impact through your arm, shoulder, or hand (golf, hammering, tennis) No difficulty    During the past week, to what extent has your arm, shoulder or hand problem interfered with your normal social activities with family, friends, neighbors, or groups? Not at all    During the past week, to what extent has your arm, shoulder or hand problem limited your work or other regular daily activities Not at all    Arm, shoulder, or hand pain. None    Tingling (pins and needles) in your arm, shoulder, or hand None    Difficulty Sleeping No difficulty    DASH Score 0 %            Objective measurements completed on examination: See above findings.               PT Education - 02/05/20 1132    Education Details POC, post op stretches, briefly lymphedema, reasons for PT    Person(s) Educated Patient    Methods Explanation;Demonstration;Handout    Comprehension Verbalized understanding                Breast Clinic Goals - 02/05/20 1141      Patient will be able to verbalize understanding of pertinent lymphedema risk reduction practices relevant to her diagnosis specifically related to skin care.   Status Achieved      Patient will be able to return demonstrate and/or verbalize understanding of the post-op home exercise program related to regaining shoulder range of motion.   Status Achieved  Patient will be able to verbalize understanding of the importance of attending the postoperative After Breast Cancer Class for further lymphedema risk  reduction education and therapeutic exercise.   Status Achieved                 Plan - 02/05/20 1137    Clinical Impression Statement Pt presents with new diagnosis of Rt sided invasive breast cancer with impending surgery with SLNB and radiation to follow.  Chemotherapy status unknown or lumpectomy vs mastectomy.  pt current has full shoulder AROM without pain or mobility limitations.  Pt was educated on reasons for PT surveillance and POC as well as L-Dex and lymphedema.    Stability/Clinical Decision Making Stable/Uncomplicated    Clinical Decision Making Low    Rehab Potential Excellent    PT Frequency --   pre and post op assessment with more visits if warranted   PT Duration 8 weeks    PT Treatment/Interventions ADLs/Self Care Home Management;Therapeutic exercise;Patient/family education;Manual techniques    PT Next Visit Plan post op check    PT Home Exercise Plan post op    Consulted and Agree with Plan of Care Patient           Patient will benefit from skilled therapeutic intervention in order to improve the following deficits and impairments:  Postural dysfunction  Visit Diagnosis: Abnormal posture  Neoplasm of uncertain behavior of lower inner quadrant of female breast, right     Problem List Patient Active Problem List   Diagnosis Date Noted  . Malignant neoplasm of lower-outer quadrant of right breast of female, estrogen receptor positive ([place]) 01/29/2020  . Diabetes mellitus without complication ([place]) 16/55/3748  . Abnormal thyroid stimulating hormone (TSH) level 09/14/2016  . Hyperglycemia 09/14/2016  . Fever blister 09/14/2016  . Advance care planning 09/14/2016  . Cough 10/06/2014  . Headache(784.0) 06/05/2012  . Shoulder pain 01/18/2012  . Anxiety 02/18/2011  . Routine general medical examination at a health care facility 09/03/2010    Shan Levans, PT 02/05/2020, 11:42 AM  [place], [place], [address] Phone: [phone]   Fax:  [phone]  Name: BRYNLEA SPINDLER MRN: [id number] Date of Birth: 1970-12-29

## 2020-02-05 NOTE — Patient Instructions (Signed)

## 2020-02-06 ENCOUNTER — Ambulatory Visit
Admission: RE | Admit: 2020-02-06 | Discharge: 2020-02-06 | Disposition: A | Discharge status: Home / Self Care | Payer: No Typology Code available for payment source | Source: Ambulatory Visit | Attending: Oncology | Admitting: Oncology

## 2020-02-06 DIAGNOSIS — C50511 Malignant neoplasm of lower-outer quadrant of right female breast: Secondary | ICD-10-CM

## 2020-02-06 IMAGING — MR MR BREAST BILAT WO/W CM
8 of 13 series · 28 of 48 positions shown · IV contrast (gadavist)
Comparison: Recent mammogram, ultrasound and biopsy examinations.

CLINICAL DATA: Pre neoadjuvant chemotherapy staging examination for
recently diagnosed invasive mammary carcinoma and DCIS in the 5:30
o'clock position of the right breast, 2 cm from the nipple,
additional DCIS in the 5:30 o'clock retroareolar right breast and
invasive mammary carcinoma in an enlarged intramammary lymph node in
the 9:30 o'clock position of the right breast, 11 cm from the
nipple. On the post clip mammogram images, the Q shaped clip and the
twisted wire X shaped clip in the 5:30 o'clock position of the right
breast are located 3.9 cm apart. The Q shaped clip in the 5:30
o'clock retroareolar right breast and [employer] shaped clip in
the axillary tail region of the right breast are located 15.8 cm
apart.

LABS:  None obtained on site today.
EXAM:
BILATERAL BREAST MRI WITH AND WITHOUT CONTRAST
TECHNIQUE: Multiplanar, multisequence MR images of both breasts were obtained
prior to and following the intravenous administration of 8 ml of
Gadavist

[Series 2: t2_tirm_tra ipat (a-p) · axial · 3.0mm · 0.70mm/px · 1 of 55 slices shown]
[im 1/55]
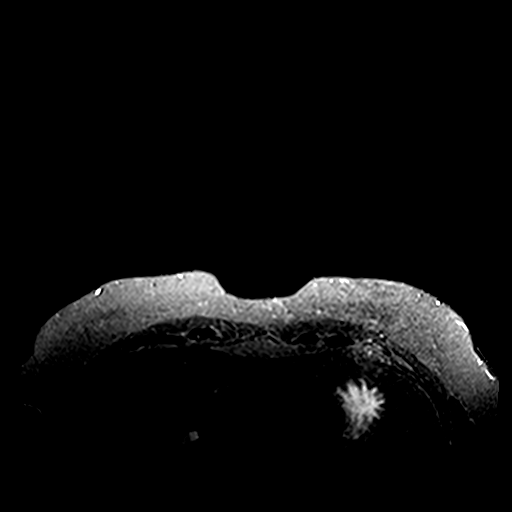

[Series 3: fl3d pre-cm no · axial · non-contrast · 1.2mm · 0.94mm/px · z∈[-47,+124]mm · 5 of 144 slices shown]
[im 1/144]
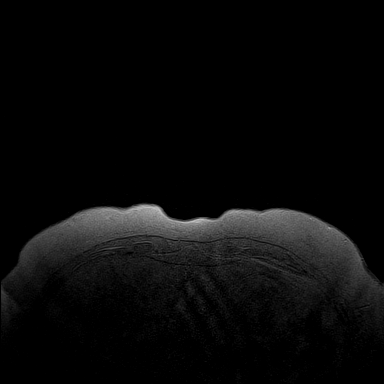
[im 36/144]
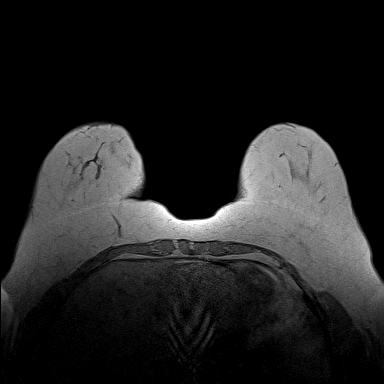
[im 72/144]
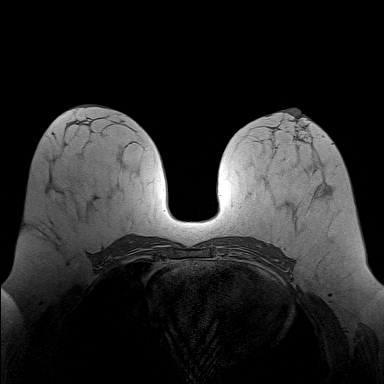
[im 108/144]
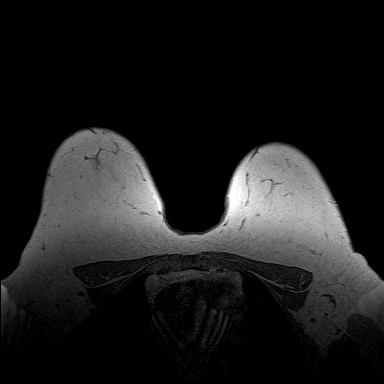
[im 144/144]
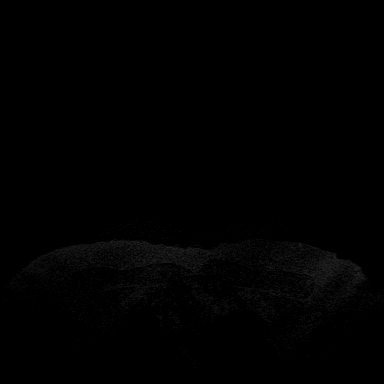

[Series 4: fl3d pre-cm · axial · non-contrast · 1.2mm · 0.94mm/px · z∈[-47,+124]mm · 5 of 144 slices shown]
[im 1/144]
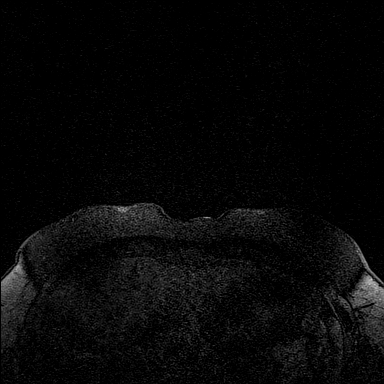
[im 36/144]
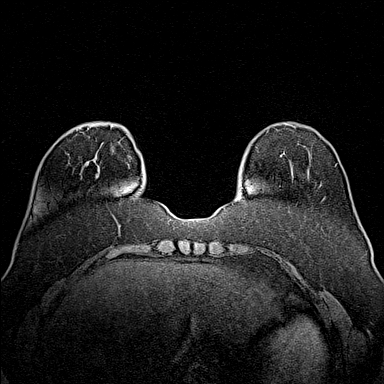
[im 72/144]
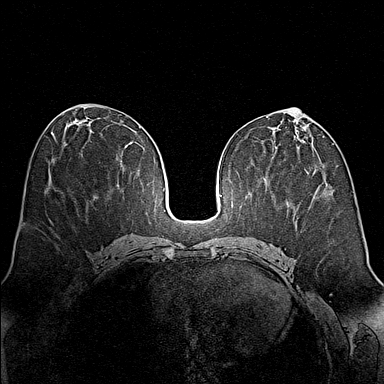
[im 108/144]
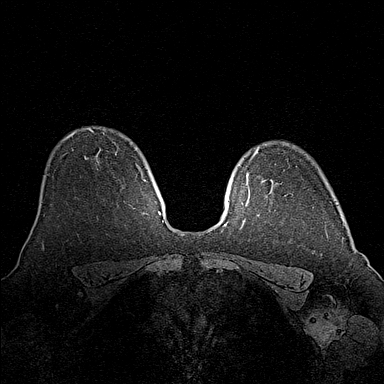
[im 144/144]
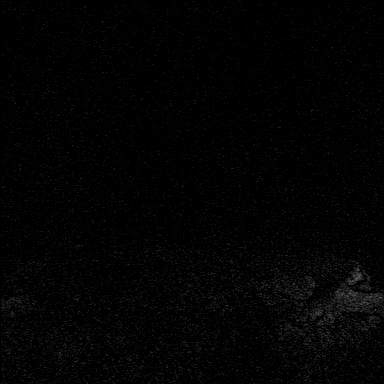

[Series 5: fl3d post-cm 20 · axial · 1.2mm · 0.94mm/px · z∈[-47,+124]mm · 5 of 144 slices shown (1 of 3)]
[im 1/144]
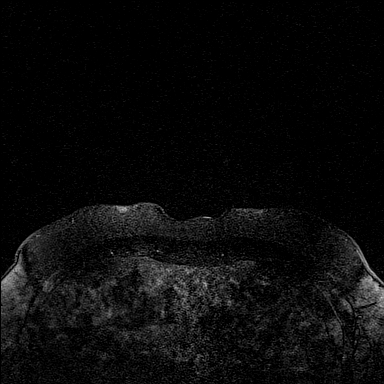
[im 36/144]
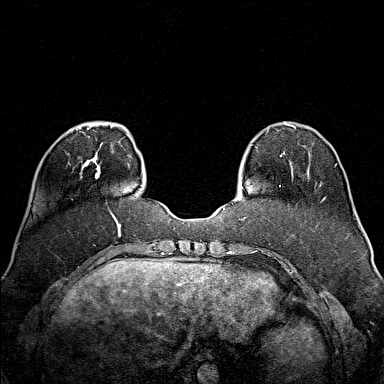
[im 72/144]
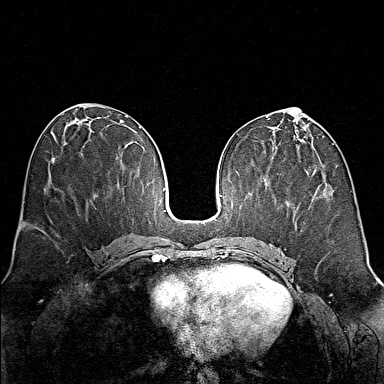
[im 108/144]
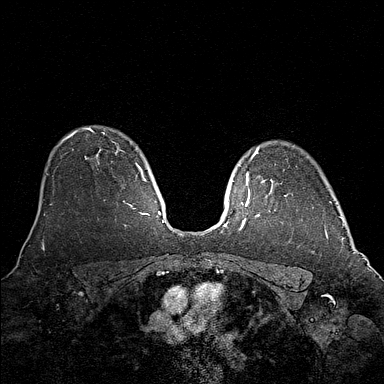
[im 144/144]
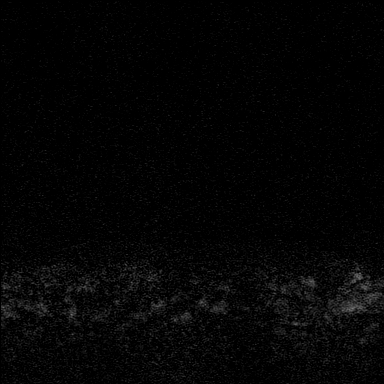

[Series 6: fl3d post-cm 20 · axial · 1.2mm · 0.94mm/px · z∈[-47,+124]mm · 5 of 144 slices shown (2 of 3)]
[im 1/144]
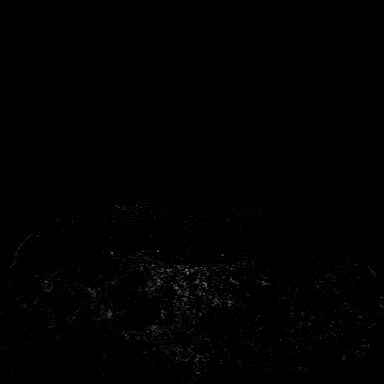
[im 36/144]
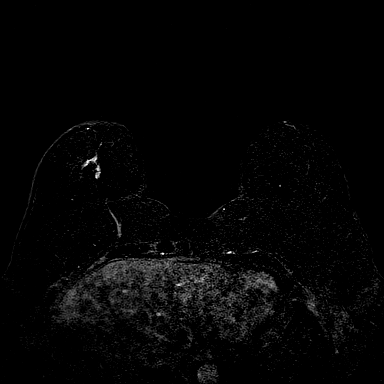
[im 72/144]
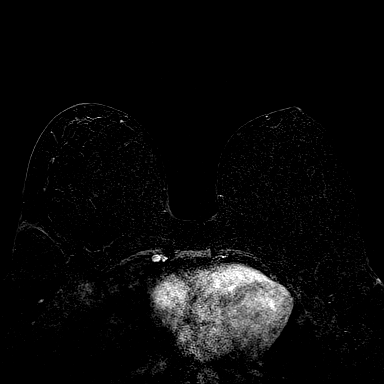
[im 108/144]
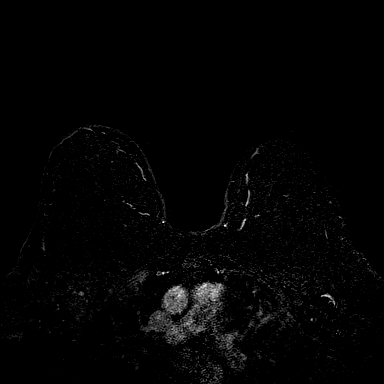
[im 144/144]
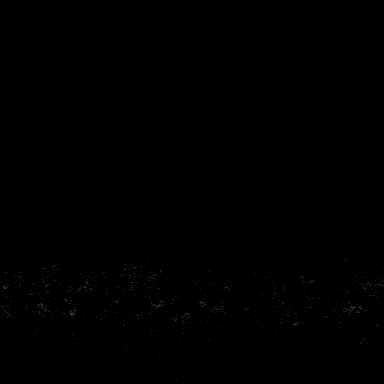

[Series 7: fl3d post-cm 20 · axial · 172.8mm · 0.94mm/px · 1 of 1 slices shown (3 of 3)]
[im 1/1]
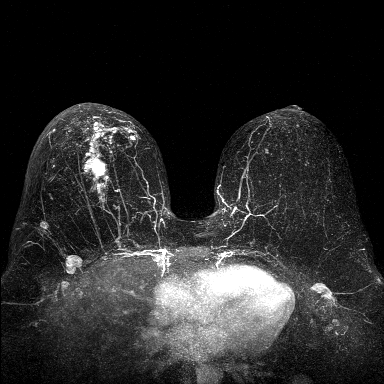

[Series 8: fl3d post-cm 3min · axial · 1.2mm · 0.94mm/px · z∈[-47,+124]mm · 5 of 144 slices shown]
[im 1/144]
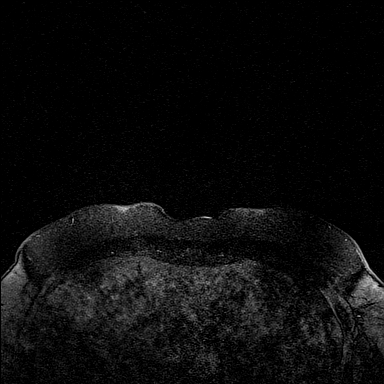
[im 36/144]
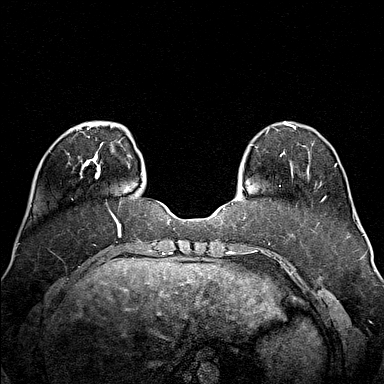
[im 72/144]
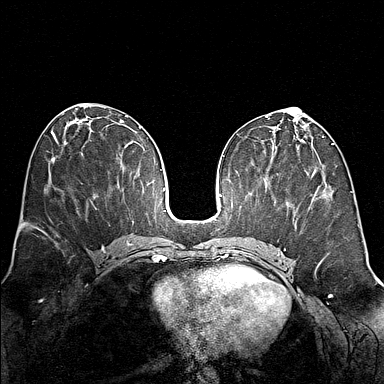
[im 108/144]
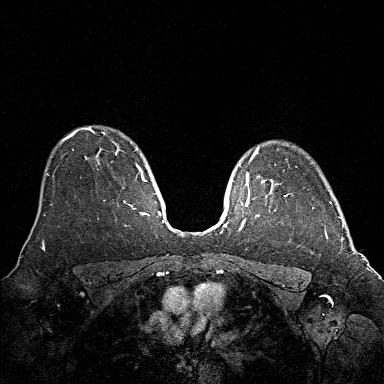
[im 144/144]
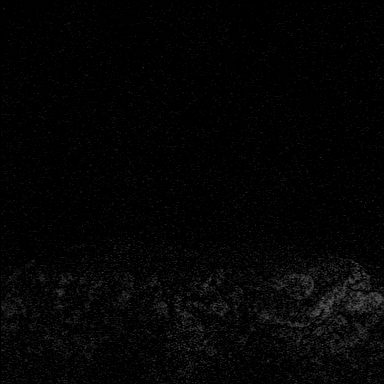

[Series 9: fl3d post-cm 3min_sub · axial · 1.2mm · 0.94mm/px · 1 of 144 slices shown]
[im 1/144]
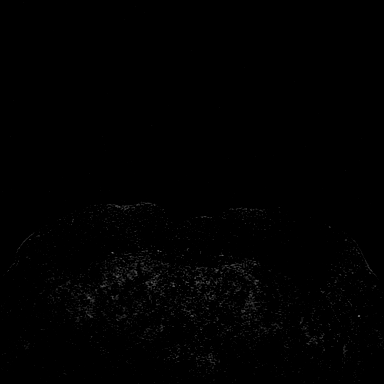

[28 of 48 positions shown; findings below may reference images not displayed]

Three-dimensional MR images were rendered by post-processing of the
original MR data on an independent workstation. The
three-dimensional MR images were interpreted, and findings are
reported in the following complete MRI report for this study. Three
dimensional images were evaluated at the independent interpreting
workstation using the DynaCAD thin client.
FINDINGS: Breast composition: b. Scattered fibroglandular tissue.

Background parenchymal enhancement: Minimal.

Right breast: Biopsy marker clip artifact in an oval mass with
mildly irregular margins in the 5:30 o'clock position of the right
breast in the middle. This measures 1.5 x 1.4 cm in maximum
dimensions on image number 10 series 9 and 1.1 cm in cephalocaudal
dimension in the sagittal plane. This has primarily rapid
wash-in/washout enhancement kinetics.

There is linear non mass enhancement extending anteriorly to the
location of the biopsy marker clip artifact in the 5:30 o'clock
retroareolar right breast. This non mass enhancement extends 3.5 cm
AP dimension and 1.8 cm in width on image number 98 series 9 and
cm in cephalocaudal dimension in the sagittal plane. This has
predominantly plateau enhancement kinetics.

There is linear non mass enhancement extending posterior to the
biopsy-proven malignancy in the 5:30 o'clock position of the right
breast, measuring 2.9 x 1.7 cm in AP and transverse dimensions
respectively on image number 98 series 9 and 1.7 cm in cephalocaudal
dimension in the sagittal plane. This has a combination of rapid
wash-in/washout and plateau enhancement kinetics.

The combined mass and linear non mass enhancement in the lower inner
right breast spans 8.3 cm in AP dimension on axial image number 98
series 9.

Left breast: There is a small, irregular enhancing mass in the
anterior aspect of the lower inner quadrant of the left breast,
measuring 0.5 x 0.3 cm in maximum dimensions on axial image number
100 series 9. This measures 0.3 cm in cephalocaudal dimension in the
sagittal plane. This has a mixture of rapid wash-in/washout and
plateau enhancement kinetics.

No other masses or abnormal enhancement suspicious for malignancy
elsewhere in the left breast.

Lymph nodes: There is an enlarged inferior right axillary lymph node
in the axillary tail region of the right breast containing a biopsy
marker clip artifact, corresponding to the biopsied metastatic lymph
node. This measures 1.7 x 1.5 cm on axial image number 66 series 11.

There is an intramammary lymph node 3.4 cm more anteriorly and
laterally with mildly thickened peripheral rim enhancement measuring
0.8 x 0.6 cm on image number 67 series 11, corresponding to the 2nd
abnormal lymph node seen on the ultrasound dated [DATE].

No abnormal appearing left axillary lymph nodes are seen. No
enlarged internal mammary lymph nodes.

Ancillary findings:  None.
IMPRESSION: 1. 1.5 x 1.4 x 1.1 cm biopsy-proven invasive mammary carcinoma in
the 5:30 o'clock position of the right breast in the middle depth.
2. Non mass enhancement extending anterior and posterior to the
biopsy-proven invasive mammary carcinoma in the 5:30 o'clock
position of the right breast extending from the retroareolar region
to the posterior aspect of the breast, spanning 8.3 cm. Anteriorly,
this corresponds to biopsy-proven DCIS and the posterior extent is
also compatible with DCIS.
3. The combined area of malignancy in the lower inner quadrant of
the right breast spans 8.3 x 2.4 x 1.8 cm.
4. Biopsy-proven metastatic lymph node in inferior right axilla in
the axillary tail region of the right breast.
5. Additional smaller intramammary lymph node 3.4 cm anterior and
lateral to the biopsy-proven malignant lymph node with imaging
features compatible with an additional metastatic node.
6. 0.5 x 0.3 x 0.3 cm irregular mass in the anterior aspect of the
lower inner quadrant of the left breast. This is concerning for a
small malignancy. It is unlikely that this would be visible
sonographically.

RECOMMENDATION:
1. MR guided core needle biopsy of the 0.5 cm mass in the lower
inner quadrant of the left breast.
2. Treatment plan for known malignancy in the right breast with
associated metastatic adenopathy.

BI-RADS CATEGORY  4: Suspicious.

## 2020-02-06 MED ORDER — GADOBUTROL 1 MMOL/ML IV SOLN
8.0000 mL | Freq: Once | INTRAVENOUS | Status: AC | PRN
  Administered 2020-02-06: 8 mL via INTRAVENOUS

## 2020-02-07 ENCOUNTER — Telehealth: Payer: Self-pay | Admitting: *Deleted

## 2020-02-07 ENCOUNTER — Other Ambulatory Visit: Payer: Self-pay | Admitting: *Deleted

## 2020-02-07 DIAGNOSIS — R928 Other abnormal and inconclusive findings on diagnostic imaging of breast: Secondary | ICD-10-CM

## 2020-02-07 DIAGNOSIS — C50511 Malignant neoplasm of lower-outer quadrant of right female breast: Secondary | ICD-10-CM

## 2020-02-07 NOTE — Telephone Encounter (Signed)
This RN spoke with pt per her call regarding " I saw my MRI report on My Chart and was wanting to know what Dr Jana Hakim would want to do "  This RN reviewed above with pt and informed her of need for additional biopsy- order will be placed for above and pt should hear from the [place] hopefully Monday regarding an appointment.

## 2020-02-10 ENCOUNTER — Telehealth: Payer: Self-pay | Admitting: *Deleted

## 2020-02-10 ENCOUNTER — Other Ambulatory Visit: Payer: Self-pay | Admitting: Oncology

## 2020-02-10 ENCOUNTER — Encounter: Payer: Self-pay | Admitting: *Deleted

## 2020-02-10 DIAGNOSIS — Z170 Estrogen receptor positive status [ER+]: Secondary | ICD-10-CM

## 2020-02-10 DIAGNOSIS — C50511 Malignant neoplasm of lower-outer quadrant of right female breast: Secondary | ICD-10-CM

## 2020-02-10 NOTE — Telephone Encounter (Signed)
Received mammaprint results of low risk.  Patient is aware.  She is getting scheduled for bx on left breast and has known right breast cancer. Informed her GI will be calling to set this up.

## 2020-02-11 ENCOUNTER — Encounter (HOSPITAL_COMMUNITY)
Admission: RE | Admit: 2020-02-11 | Discharge: 2020-02-11 | Disposition: A | Discharge status: Home / Self Care | Payer: No Typology Code available for payment source | Source: Ambulatory Visit | Attending: Oncology | Admitting: Oncology

## 2020-02-11 ENCOUNTER — Ambulatory Visit (HOSPITAL_COMMUNITY)
Admission: RE | Admit: 2020-02-11 | Discharge: 2020-02-11 | Disposition: A | Discharge status: Home / Self Care | Payer: No Typology Code available for payment source | Source: Ambulatory Visit | Attending: Oncology | Admitting: Oncology

## 2020-02-11 ENCOUNTER — Encounter (HOSPITAL_COMMUNITY): Payer: Self-pay

## 2020-02-11 ENCOUNTER — Other Ambulatory Visit: Payer: Self-pay

## 2020-02-11 ENCOUNTER — Encounter: Payer: Self-pay | Admitting: *Deleted

## 2020-02-11 ENCOUNTER — Encounter: Payer: Self-pay | Admitting: Oncology

## 2020-02-11 DIAGNOSIS — Z170 Estrogen receptor positive status [ER+]: Secondary | ICD-10-CM

## 2020-02-11 DIAGNOSIS — C50511 Malignant neoplasm of lower-outer quadrant of right female breast: Secondary | ICD-10-CM | POA: Diagnosis present

## 2020-02-11 HISTORY — DX: Type 2 diabetes mellitus without complications: E11.9

## 2020-02-11 IMAGING — NM NM BONE WHOLE BODY
2 series · 2 of 2 positions shown · non-contrast
Comparison: None

Correlation: CT chest [DATE]

CLINICAL DATA: RIGHT breast cancer, staging

EXAM:
NUCLEAR MEDICINE WHOLE BODY BONE SCAN
TECHNIQUE: Whole body anterior and posterior images were obtained approximately
3 hours after intravenous injection of radiopharmaceutical.
RADIOPHARMACEUTICALS:  20.5 mCi [VP] MDP IV

[Series 1: whole body · 2.66mm/px · 1 of 1 slices shown (1 of 2)]
[im 1/1]
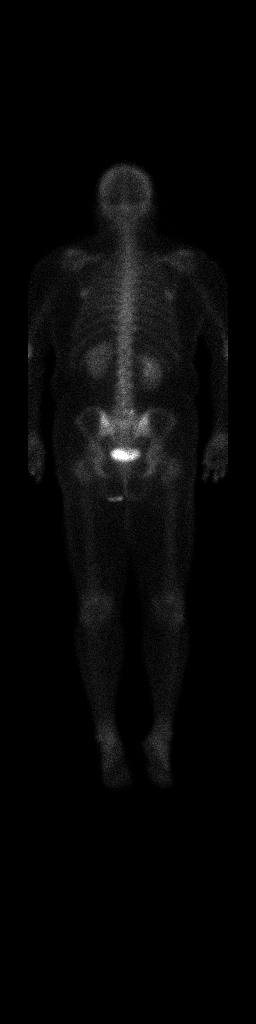

[Series 1: whole body · 2.66mm/px · 1 of 1 slices shown (2 of 2)]
[im 1/1]
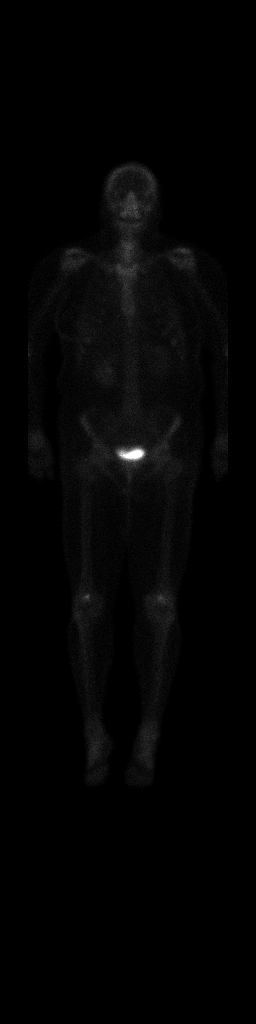

[2 of 2 positions shown; findings below may reference images not displayed]

FINDINGS: Uptake at shoulders, sternoclavicular joints, elbows, knees, hips,
typically degenerative.

Minimal uptake RIGHT L5-S1 likely degenerative.

No definite worrisome sites of abnormal osseous tracer accumulation
are identified to suggest osseous metastases.

Expected urinary tract and soft tissue distribution of tracer.
IMPRESSION: No scintigraphic evidence of osseous metastatic disease.

## 2020-02-11 IMAGING — CT CT CHEST W/ CM
2 of 3 series · 15 of 36 positions shown, 18 images · IV contrast (omnipaque)
Comparison: None.

CLINICAL DATA: New diagnosis right breast cancer, node positive,
chest staging

EXAM:
CT CHEST WITH CONTRAST
TECHNIQUE: Multidetector CT imaging of the chest was performed during
intravenous contrast administration.
CONTRAST:  75mL OMNIPAQUE IOHEXOL 300 MG/ML  SOLN

[Series 2: axial st · axial · 0.70mm/px · z∈[-254,-22]mm · 12 of 138 slices shown, 15 images]
[im 11/138  mediastinal]
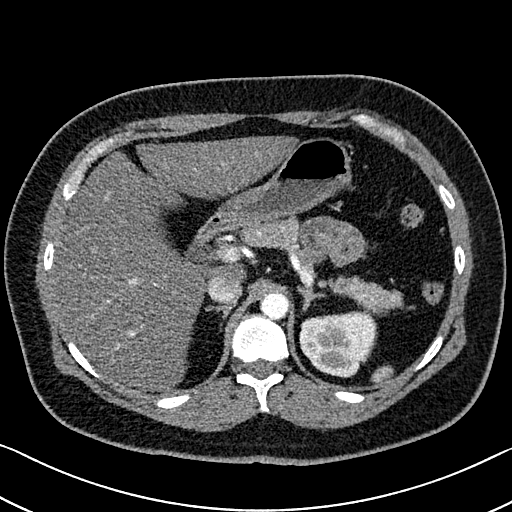
[im 11/138  lung]
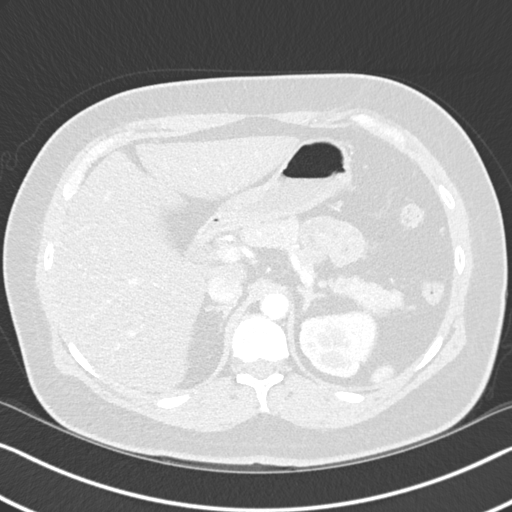
[im 21/138  lung]
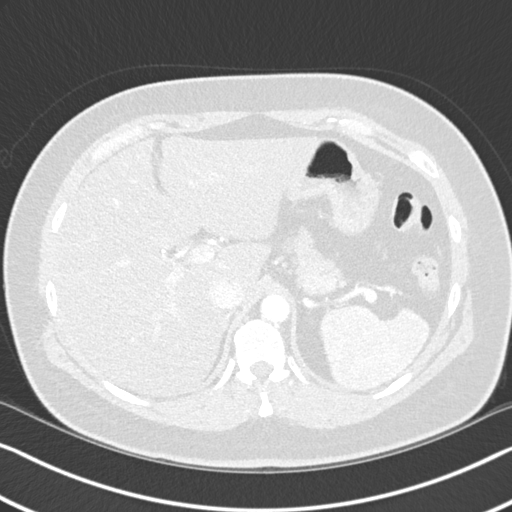
[im 31/138  lung]
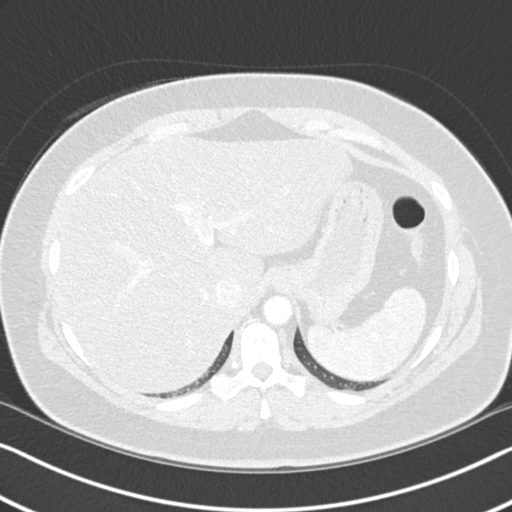
[im 41/138  lung]
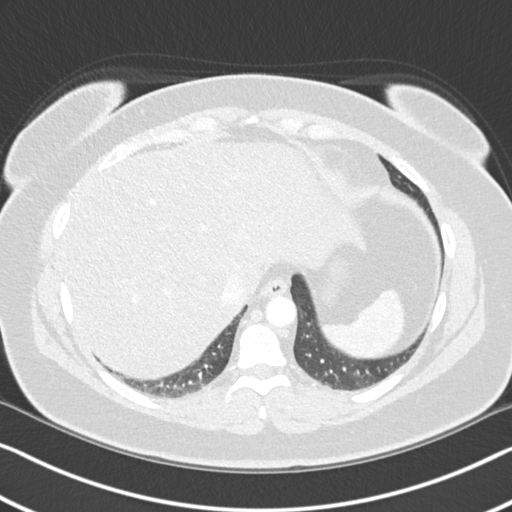
[im 51/138  mediastinal]
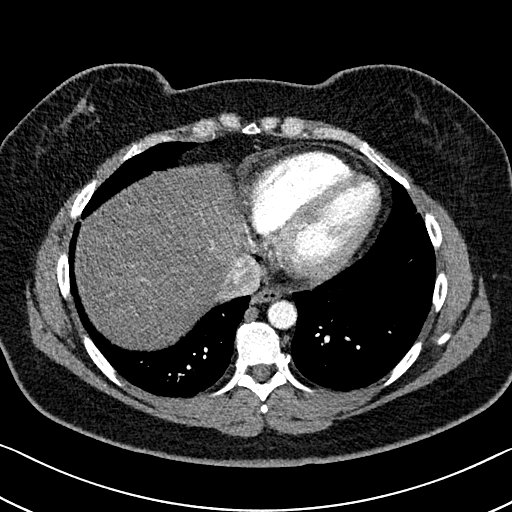
[im 51/138  lung]
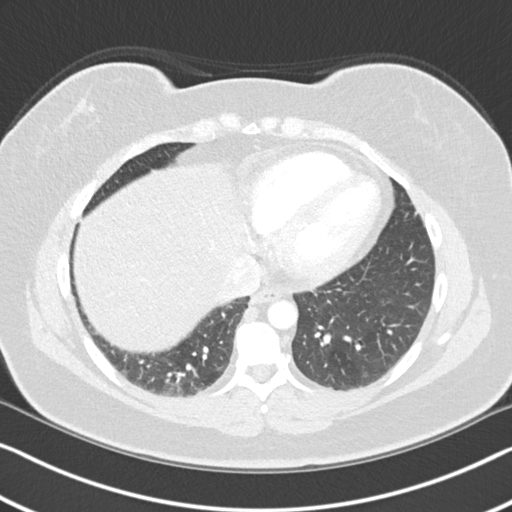
[im 61/138  lung]
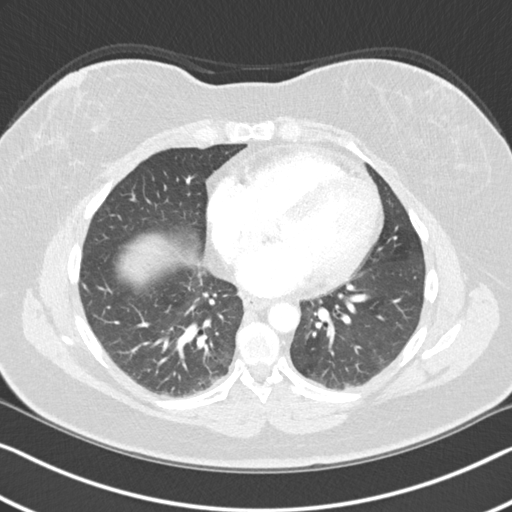
[im 77/138  lung]
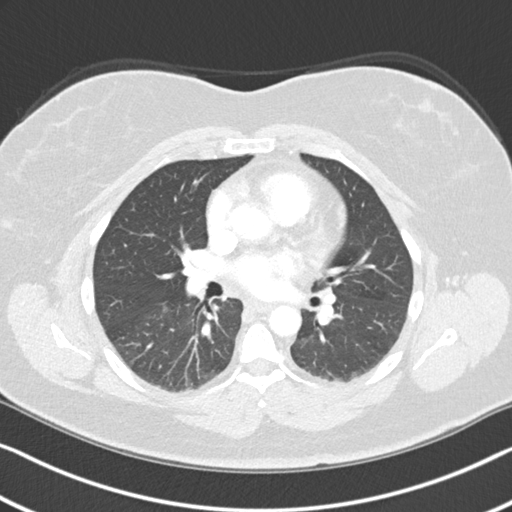
[im 87/138  lung]
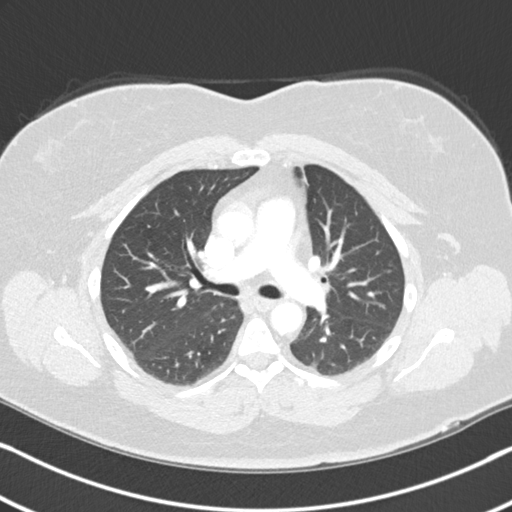
[im 97/138  mediastinal]
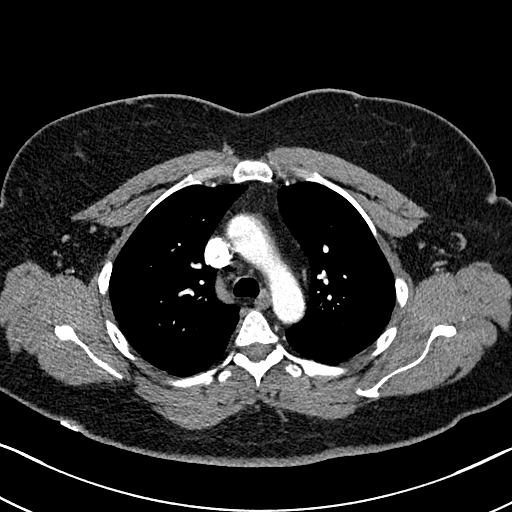
[im 97/138  lung]
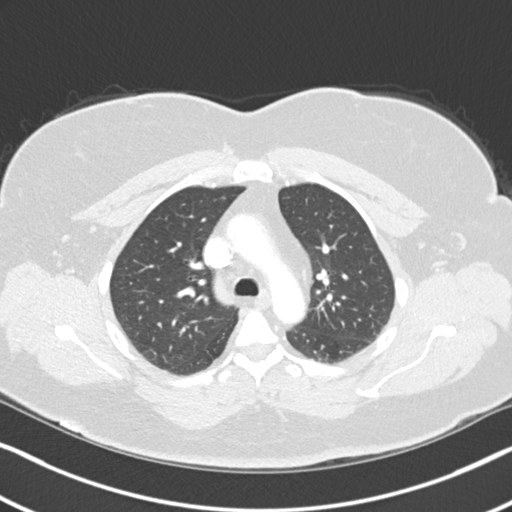
[im 107/138  lung]
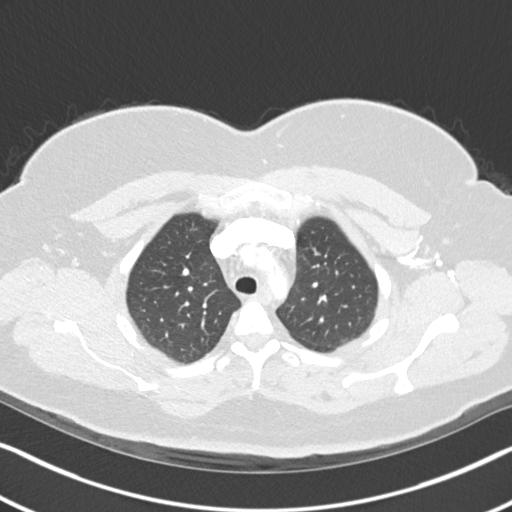
[im 117/138  lung]
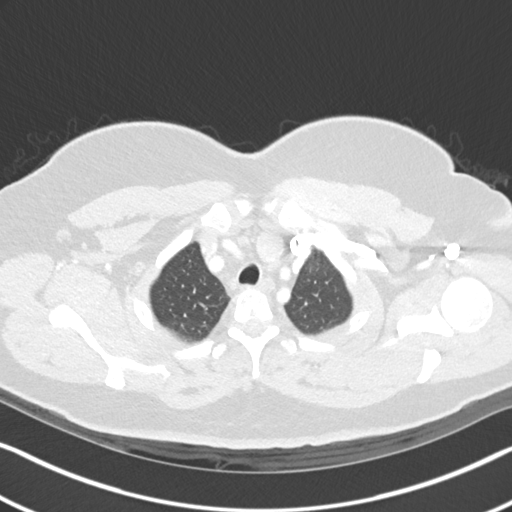
[im 127/138  lung]
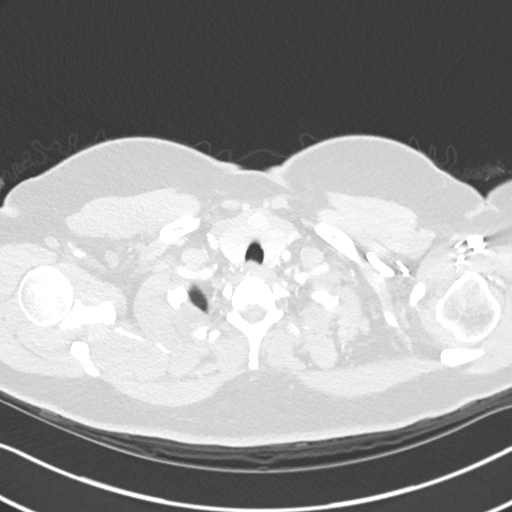

[Series 6: coronal · coronal · 0.54mm/px · 3 of 135 slices shown]
[im 27/135  lung]
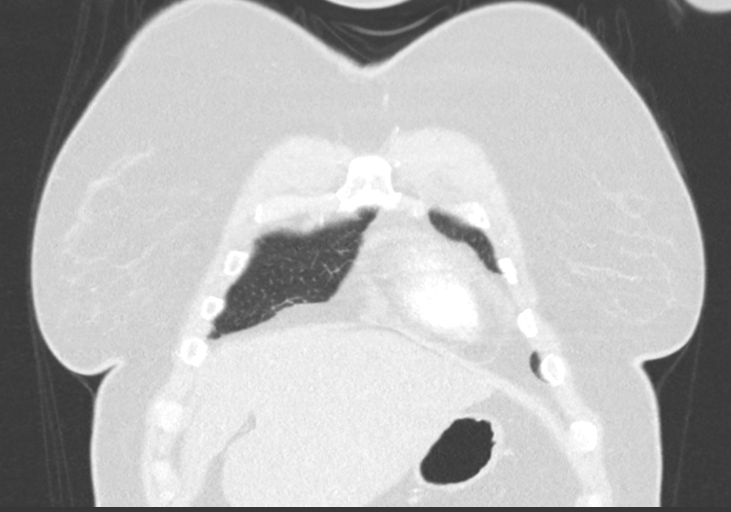
[im 54/135  lung]
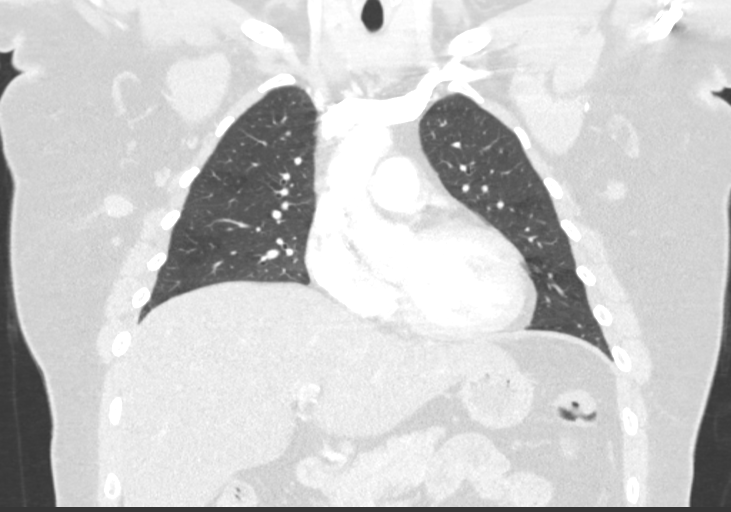
[im 81/135  lung]
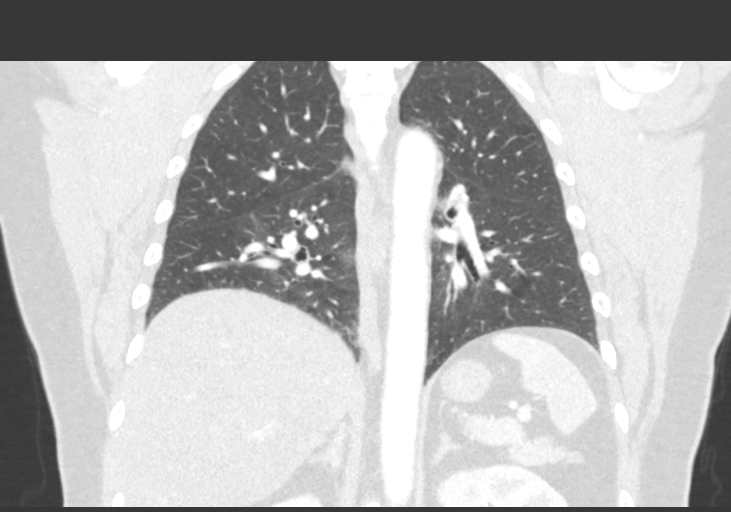

[15 of 36 positions shown; findings below may reference images not displayed]

FINDINGS: Cardiovascular: No significant vascular findings. Normal heart size.
No pericardial effusion.

Mediastinum/Nodes: Enlarged right axillary lymph node containing a
biopsy marking clip, measuring 1.9 x 1.8 cm (series 2, image 58).
Thyroid gland, trachea, and esophagus demonstrate no significant
findings.

Lungs/Pleura: Lungs are clear. No pleural effusion or pneumothorax.

Upper Abdomen: No acute abnormality. Hepatic steatosis. Status post
cholecystectomy.

Musculoskeletal: No chest wall mass or suspicious bone lesions
identified. Marking clips within masses of the central right breast,
the largest component measuring 1.7 x 1.5 cm (series 2, image 80).
IMPRESSION: 1. Masses of the right breast and enlarged right axillary lymph node
containing biopsy marking clips, in keeping with new diagnosis of
breast cancer.

2. No other enlarged lymph nodes or evidence of metastatic disease
in the chest.

3.  Hepatic steatosis.

## 2020-02-11 MED ORDER — IOHEXOL 300 MG/ML  SOLN
75.0000 mL | Freq: Once | INTRAMUSCULAR | Status: AC | PRN
  Administered 2020-02-11: 75 mL via INTRAVENOUS

## 2020-02-11 MED ORDER — TECHNETIUM TC 99M MEDRONATE IV KIT
20.0000 | PACK | Freq: Once | INTRAVENOUS | Status: AC | PRN
  Administered 2020-02-11: 20.5 via INTRAVENOUS

## 2020-02-12 NOTE — Progress Notes (Signed)
[place]  [phone] [phone]) [phone]     ID: Christy Ferrell DOB: 28-May-1970  MR#: [id number]  JOA#:[id number]  Patient Care Team: Christy Ghent, MD as PCP - General (Family Medicine) Christy Kaufmann, RN as Oncology Nurse Navigator Christy Germany, RN as Oncology Nurse Navigator Christy Ferrell, Virgie Dad, MD as Consulting Physician (Oncology) Christy Kussmaul, MD as Consulting Physician (General Surgery) Dillingham, Loel Lofty, DO as Attending Physician (Plastic Surgery) Chauncey Cruel, MD OTHER MD:  CHIEF COMPLAINT: Estrogen receptor positive breast cancer  CURRENT TREATMENT: tamoxifen; awaiting definitive surgery  INTERVAL HISTORY: Christy Ferrell returns today for follow up of her estrogen receptor positive breast cancer. She was evaluated in the breast cancer clinic on 01/29/2020.  Since consultation, she underwent breast MRI on 02/06/2020 showing: breast composition B; 1.5 cm biopsy-proven right breast malignancy at 5:30, with non-mass enhancement corresponding to biopsy-proven DCIS, together measuring 8.3 cm; biopsy-proven metastatic right axillary lymph node; additional smaller intramammary lymph node with imaging features compatible with additional metastatic node;   She also had a 0.5 cm irregular mass in lower-inner left breast, unlikely to be seen sonographically. She is scheduled for biopsy of the left breast area in question on 02/19/2020.  Mammaprint was performed on the biopsy sample and showed low risk.  She completed her staging work up with chest CT and bone scan on 02/11/2020. Both of these were negative for any evidence of metastatic disease.  She has had blood drawn for genetics but results are not in yet.  REVIEW OF SYSTEMS: Christy Ferrell has managed to get through most of her workup efficiently and without undue anxiety. She is considering her alternatives with a clear head. A detailed ROS today was stable.   HISTORY OF CURRENT ILLNESS: From the  original visit note:  Christy Ferrell herself palpated a right breast mass. She underwent bilateral diagnostic mammography with tomography and right breast ultrasonography at [facility] on 01/15/2020 showing: breast density category B; palpable 1.6 cm mass in right breast at 5:30, with adjacent 5 mm probable satellite mass; 0.9 cm mass in retroareolar right breast, also at 5:30; two enlarged lymph nodes in right breast at 9:30.  Accordingly on 01/17/2020 she proceeded to biopsy of the right breast areas in question. The pathology from this procedure ([id number]) showed:  1. Right Breast, 5:30  - invasive mammary carcinoma, grade 2  - ductal carcinoma in situ, low grade.   - Prognostic indicators significant for: estrogen receptor, 95% positive and progesterone receptor, 95% positive, both with strong staining intensity. Proliferation marker Ki67 not obtained. HER2 negative by immunohistochemistry (0). 2. Right Breast, retroareolar  - ductal carcinoma in situ 3. Lymph Node, right axilla  - invasive mammary carcinoma  - lymph node tissue not identified  We do not have an e-cadherin or Ki67 on this tissue.  The patient's subsequent history is as detailed below.   PAST MEDICAL HISTORY: Past Medical History:  Diagnosis Date  . Anxiety    h/o, was prev on zoloft  . Breast cancer ([place]) 01/2020   seeing Dr. Marlou Starks  . Cholelithiasis   . Diabetes mellitus without complication ([place])   . Hypothyroidism    postpartum hypothyroidism  . IUD (intrauterine device) in place    mirena per [employer]  . Major depression     PAST SURGICAL HISTORY: Past Surgical History:  Procedure Laterality Date  . CESAREAN SECTION     X 2 Fetal stress, second scheduled  . CHOLECYSTECTOMY  05/1998    FAMILY HISTORY: Family History  Problem Relation Age of Onset  . Cancer Father 71       Prostate, S/P prostatectomy  . Hypertension Father   . Depression Father   . Asthma Sister   . Spina bifida  Sister   . Hypothyroidism Mother   . Hypertension Maternal Grandmother   . Hyperlipidemia Maternal Grandmother   . Lung disease Maternal Grandmother   . Hypothyroidism Maternal Grandfather   . Emphysema Paternal Grandmother   . Diabetes Maternal Uncle   . Diabetes Maternal Uncle   . Leukemia Maternal Uncle 107  . Breast cancer Other 45  . Stroke Neg Hx   . Colon cancer Neg Hx     The patient's father died at age 43 from prostate cancer.  The patient's mother is 34 years old as of September 2021.  The patient has 1 uncle with pancreatic cancer and one cousin on the same side of the family with breast cancer.   GYNECOLOGIC HISTORY:  No LMP recorded. (Menstrual status: IUD). Menarche: 49 years old Age at first live birth: 49 years old [place] P 2 Contraceptive Mirena in place Hysterectomy?  No BSO?  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  She is divorced.  She lives alone although her significant other Christy Ferrell (who is disabled secondary to COPD) sometimes comes over.  Her sons are Christy Ferrell who is Chief Strategy Officer at [employer] and Christy Ferrell who is also at [employer] considering dentistry or physical therapy   ADVANCED DIRECTIVES: Not in place.  The patient intends to name her older son as her healthcare power of attorney and the appropriate documents were giving her on 01/29/2020 for her to complete and notarized at her discretion   HEALTH MAINTENANCE: Social History   Tobacco Use  . Smoking status: Never Smoker  . Smokeless tobacco: Never Used  Vaping Use  . Vaping Use: Never used  Substance Use Topics  . Alcohol use: Yes    Alcohol/week: 0.0 standard drinks    Comment: Very rarely.  . Drug use: No     Colonoscopy:   PAP: Not up-to-date  Bone density: Never   Allergies  Allergen Reactions  . Codeine Nausea And Vomiting  . Zoloft [Sertraline Hcl]     headaches    Current Outpatient  Medications  Medication Sig Dispense Refill  . acyclovir ointment (ZOVIRAX) 5 % Apply 1 application topically every 3 (three) hours. As needed. 5 g 5  . cyclobenzaprine (FLEXERIL) 10 MG tablet TAKE 1/2 TO 1 TABLET BY MOUTH EVERY DAY AS NEEDED 30 tablet 5  . escitalopram (LEXAPRO) 10 MG tablet Take 1 tablet (10 mg total) by mouth daily. 90 tablet 3  . ibuprofen (ADVIL,MOTRIN) 200 MG tablet Take 200 mg by mouth every 6 (six) hours as needed.    Marland Kitchen levonorgestrel (MIRENA) 20 MCG/24HR IUD 1 each by Intrauterine route once. 11/05/12    . levothyroxine (SYNTHROID) 25 MCG tablet Take 1 tablet (25 mcg total) by mouth daily before breakfast. 90 tablet 3  . tamoxifen (NOLVADEX) 20 MG tablet Take 1 tablet (20 mg total) by mouth daily. 90 tablet 12  . valACYclovir (VALTREX) 1000 MG tablet Take 2 tablets (2,000 mg total) by mouth 2 (two) times daily as needed. For 1 day. 20 tablet 5   No current facility-administered medications for this visit.    OBJECTIVE: White woman in no acute distress  Vitals:   02/13/20 1235  BP: 128/75  Pulse: 66  Resp: 17  Temp: (!) 97.1 F (36.2 C)  SpO2: 95%     Body mass index is 30.89 kg/m.   Wt Readings from Last 3 Encounters:  02/13/20 174 lb 6.4 oz (79.1 kg)  01/29/20 173 lb 3.2 oz (78.6 kg)  01/07/20 173 lb (78.5 kg)      ECOG FS:1 - Symptomatic but completely ambulatory  Sclerae unicteric, EOMs intact Wearing a mask No cervical or supraclavicular adenopathy Lungs no rales or rhonchi Heart regular rate and rhythm Abd soft, nontender, positive bowel sounds MSK no focal spinal tenderness, no upper extremity lymphedema Neuro: nonfocal, well oriented, appropriate affect Breasts: I do not palpate a well-defined mass in the right breast today. There are no skin changes of concern.  LAB RESULTS:  CMP     Component Value Date/Time   NA 140 01/29/2020 1526   K 3.8 01/29/2020 1526   CL 104 01/29/2020 1526   CO2 29 01/29/2020 1526   GLUCOSE 125 (H)  01/29/2020 1526   BUN 18 01/29/2020 1526   CREATININE 0.84 01/29/2020 1526   CALCIUM 10.3 01/29/2020 1526   PROT 8.2 (H) 01/29/2020 1526   ALBUMIN 4.1 01/29/2020 1526   AST 22 01/29/2020 1526   ALT 31 01/29/2020 1526   ALKPHOS 78 01/29/2020 1526   BILITOT 0.5 01/29/2020 1526   GFRNONAA >60 01/29/2020 1526   GFRAA >60 01/29/2020 1526    No results found for: TOTALPROTELP, ALBUMINELP, A1GS, A2GS, BETS, BETA2SER, GAMS, MSPIKE, SPEI  Lab Results  Component Value Date   WBC 11.3 (H) 01/29/2020   NEUTROABS 6.0 01/29/2020   HGB 14.7 01/29/2020   HCT 42.8 01/29/2020   MCV 88.2 01/29/2020   PLT 306 01/29/2020    No results found for: LABCA2  No components found for: [id number]  No results for input(s): INR in the last 168 hours.  No results found for: LABCA2  No results found for: SVX793  No results found for: JQZ009  No results found for: QZR007  No results found for: CA2729  No components found for: HGQUANT  No results found for: CEA1 / No results found for: CEA1   No results found for: AFPTUMOR  No results found for: CHROMOGRNA  No results found for: KPAFRELGTCHN, LAMBDASER, KAPLAMBRATIO (kappa/lambda light chains)  No results found for: HGBA, HGBA2QUANT, HGBFQUANT, HGBSQUAN (Hemoglobinopathy evaluation)   No results found for: LDH  No results found for: IRON, TIBC, IRONPCTSAT (Iron and TIBC)  No results found for: FERRITIN  Urinalysis    Component Value Date/Time   BILIRUBINUR negative 02/25/2015 1645   PROTEINUR + 0.15 02/25/2015 1645   UROBILINOGEN 0.2 02/25/2015 1645   NITRITE negative 02/25/2015 1645   LEUKOCYTESUR small (1+) (A) 02/25/2015 1645     STUDIES: CT Chest W Contrast  Result Date: 02/12/2020 CLINICAL DATA:  New diagnosis right breast cancer, node positive, chest staging EXAM: CT CHEST WITH CONTRAST TECHNIQUE: Multidetector CT imaging of the chest was performed during intravenous contrast administration. CONTRAST:  61m OMNIPAQUE  IOHEXOL 300 MG/ML  SOLN COMPARISON:  None. FINDINGS: Cardiovascular: No significant vascular findings. Normal heart size. No pericardial effusion. Mediastinum/Nodes: Enlarged right axillary lymph node containing a biopsy marking clip, measuring 1.9 x 1.8 cm (series 2, image 58). Thyroid gland, trachea, and esophagus demonstrate no significant findings. Lungs/Pleura: Lungs are clear. No pleural effusion or pneumothorax. Upper Abdomen: No acute abnormality. Hepatic steatosis. Status post cholecystectomy. Musculoskeletal: No chest wall mass or  suspicious bone lesions identified. Marking clips within masses of the central right breast, the largest component measuring 1.7 x 1.5 cm (series 2, image 80). IMPRESSION: 1. Masses of the right breast and enlarged right axillary lymph node containing biopsy marking clips, in keeping with new diagnosis of breast cancer. 2. No other enlarged lymph nodes or evidence of metastatic disease in the chest. 3.  Hepatic steatosis. Electronically Signed   By: Eddie Candle M.D.   On: 02/12/2020 09:03   NM Bone Scan Whole Body  Result Date: 02/12/2020 CLINICAL DATA:  RIGHT breast cancer, staging EXAM: NUCLEAR MEDICINE WHOLE BODY BONE SCAN TECHNIQUE: Whole body anterior and posterior images were obtained approximately 3 hours after intravenous injection of radiopharmaceutical. RADIOPHARMACEUTICALS:  20.5 mCi Technetium-70mMDP IV COMPARISON:  None Correlation: CT chest 02/11/2020 FINDINGS: Uptake at shoulders, sternoclavicular joints, elbows, knees, hips, typically degenerative. Minimal uptake RIGHT L5-S1 likely degenerative. No definite worrisome sites of abnormal osseous tracer accumulation are identified to suggest osseous metastases. Expected urinary tract and soft tissue distribution of tracer. IMPRESSION: No scintigraphic evidence of osseous metastatic disease. Electronically Signed   By: MLavonia DanaM.D.   On: 02/12/2020 11:41   MR BREAST BILATERAL W WO CONTRAST INC CAD  Result  Date: 02/06/2020 CLINICAL DATA:  Pre neoadjuvant chemotherapy staging examination for recently diagnosed invasive mammary carcinoma and DCIS in the 5:30 o'clock position of the right breast, 2 cm from the nipple, additional DCIS in the 5:30 o'clock retroareolar right breast and invasive mammary carcinoma in an enlarged intramammary lymph node in the 9:30 o'clock position of the right breast, 11 cm from the nipple. On the post clip mammogram images, the Q shaped clip and the twisted wire X shaped clip in the 5:30 o'clock position of the right breast are located 3.9 cm apart. The Q shaped clip in the 5:30 o'clock retroareolar right breast and Hologic Vision shaped clip in the axillary tail region of the right breast are located 15.8 cm apart. LABS:  None obtained on site today. EXAM: BILATERAL BREAST MRI WITH AND WITHOUT CONTRAST TECHNIQUE: Multiplanar, multisequence MR images of both breasts were obtained prior to and following the intravenous administration of 8 ml of Gadavist Three-dimensional MR images were rendered by post-processing of the original MR data on an independent workstation. The three-dimensional MR images were interpreted, and findings are reported in the following complete MRI report for this study. Three dimensional images were evaluated at the independent interpreting workstation using the DynaCAD thin client. COMPARISON:  Recent mammogram, ultrasound and biopsy examinations. FINDINGS: Breast composition: b. Scattered fibroglandular tissue. Background parenchymal enhancement: Minimal. Right breast: Biopsy marker clip artifact in an oval mass with mildly irregular margins in the 5:30 o'clock position of the right breast in the middle. This measures 1.5 x 1.4 cm in maximum dimensions on image number 10 series 9 and 1.1 cm in cephalocaudal dimension in the sagittal plane. This has primarily rapid wash-in/washout enhancement kinetics. There is linear non mass enhancement extending anteriorly to the  location of the biopsy marker clip artifact in the 5:30 o'clock retroareolar right breast. This non mass enhancement extends 3.5 cm AP dimension and 1.8 cm in width on image number 98 series 9 and 2.4 cm in cephalocaudal dimension in the sagittal plane. This has predominantly plateau enhancement kinetics. There is linear non mass enhancement extending posterior to the biopsy-proven malignancy in the 5:30 o'clock position of the right breast, measuring 2.9 x 1.7 cm in AP and transverse dimensions respectively on image number  98 series 9 and 1.7 cm in cephalocaudal dimension in the sagittal plane. This has a combination of rapid wash-in/washout and plateau enhancement kinetics. The combined mass and linear non mass enhancement in the lower inner right breast spans 8.3 cm in AP dimension on axial image number 98 series 9. Left breast: There is a small, irregular enhancing mass in the anterior aspect of the lower inner quadrant of the left breast, measuring 0.5 x 0.3 cm in maximum dimensions on axial image number 100 series 9. This measures 0.3 cm in cephalocaudal dimension in the sagittal plane. This has a mixture of rapid wash-in/washout and plateau enhancement kinetics. No other masses or abnormal enhancement suspicious for malignancy elsewhere in the left breast. Lymph nodes: There is an enlarged inferior right axillary lymph node in the axillary tail region of the right breast containing a biopsy marker clip artifact, corresponding to the biopsied metastatic lymph node. This measures 1.7 x 1.5 cm on axial image number 66 series 11. There is an intramammary lymph node 3.4 cm more anteriorly and laterally with mildly thickened peripheral rim enhancement measuring 0.8 x 0.6 cm on image number 67 series 11, corresponding to the 2nd abnormal lymph node seen on the ultrasound dated 01/15/2020. No abnormal appearing left axillary lymph nodes are seen. No enlarged internal mammary lymph nodes. Ancillary findings:  None.  IMPRESSION: 1. 1.5 x 1.4 x 1.1 cm biopsy-proven invasive mammary carcinoma in the 5:30 o'clock position of the right breast in the middle depth. 2. Non mass enhancement extending anterior and posterior to the biopsy-proven invasive mammary carcinoma in the 5:30 o'clock position of the right breast extending from the retroareolar region to the posterior aspect of the breast, spanning 8.3 cm. Anteriorly, this corresponds to biopsy-proven DCIS and the posterior extent is also compatible with DCIS. 3. The combined area of malignancy in the lower inner quadrant of the right breast spans 8.3 x 2.4 x 1.8 cm. 4. Biopsy-proven metastatic lymph node in inferior right axilla in the axillary tail region of the right breast. 5. Additional smaller intramammary lymph node 3.4 cm anterior and lateral to the biopsy-proven malignant lymph node with imaging features compatible with an additional metastatic node. 6. 0.5 x 0.3 x 0.3 cm irregular mass in the anterior aspect of the lower inner quadrant of the left breast. This is concerning for a small malignancy. It is unlikely that this would be visible sonographically. RECOMMENDATION: 1. MR guided core needle biopsy of the 0.5 cm mass in the lower inner quadrant of the left breast. 2. Treatment plan for known malignancy in the right breast with associated metastatic adenopathy. BI-RADS CATEGORY  4: Suspicious. Electronically Signed   By: Claudie Revering M.D.   On: 02/06/2020 15:43   US BREAST LTD UNI RIGHT INC AXILLA  Result Date: 01/15/2020 CLINICAL DATA:  Patient complains of a palpable right breast mass. EXAM: DIGITAL DIAGNOSTIC BILATERAL MAMMOGRAM WITH CAD AND TOMO ULTRASOUND RIGHT BREAST COMPARISON:  Previous exam(s). ACR Breast Density Category b: There are scattered areas of fibroglandular density. FINDINGS: No suspicious mass or malignant type microcalcifications identified in the left breast. There is a 1.4 cm spiculated mass in the lower-inner quadrant of the right breast and  a 1.2 cm mass in the upper outer quadrant of the right breast (palpable mass). There are no malignant type microcalcifications. Mammographic images were processed with CAD. On physical exam, I palpate a mass in the right breast at 9:30 11 cm from the nipple. I palpate thickening in the  right breast at 5:30 1-2 cm from the nipple. Targeted ultrasound is performed, showing an enlarged lymph node at 9:30 11 cm from the nipple accounting for the palpable abnormality measuring 1.0 x 1.1 x 1.0 cm. 1.6 cm lateral is a larger abnormal lymph node measuring 2.0 x 1.3 x 0.9 cm. There is a third benign appearing lymph node 6 x 5 x 7 mm. Sonographic evaluation higher in the right axilla does not show any additional enlarged adenopathy. There is an irregular hypoechoic mass in the right breast at 5:30 1-2 cm from the nipple measuring 1.6 x 1.1 x 1.3 cm. There is an adjacent 5 mm hypoechoic mass that is likely a satellite nodule. There is an irregular hypoechoic mass in the right breast at 5:30 in the retroareolar region measuring 0.6 x 0.6 x 0.9 cm. This lesion is located 1.6 cm from the larger mass. IMPRESSION: 1. Suspicious mass in the 5:30 region of the right breast 1-2 cm from the nipple with adjacent 5 mm probable satellite mass. 2. Suspicious mass in the 5:30 retroareolar region of the right breast. 3. Two enlarged lymph nodes in the right breast at 9:30 11 cm from the nipple. RECOMMENDATION: 1. Three ultrasound-guided core biopsies is recommended. Ultrasound-guided core biopsies of the mass in the right breast at 5:30 1 to 2 cm from the nipple, mass at in the 530 region of the right breast in the retroareolar region and the larger of the 2 lymph nodes at 9:30 11 cm from the nipple is recommended. 2. The masses and lymph nodes are highly concerning for invasive mammary carcinoma with metastatic adenopathy. There is likely at least 1 satellite mass adjacent to the mass at 5:30 1 to 2 cm from the nipple. Further evaluation  with MRI is recommended. I have discussed the findings and recommendations with the patient. If applicable, a reminder letter will be sent to the patient regarding the next appointment. BI-RADS CATEGORY  5: Highly suggestive of malignancy. Electronically Signed   By: Lillia Mountain M.D.   On: 01/15/2020 12:35   MM DIAG BREAST TOMO BILATERAL  Result Date: 01/15/2020 CLINICAL DATA:  Patient complains of a palpable right breast mass. EXAM: DIGITAL DIAGNOSTIC BILATERAL MAMMOGRAM WITH CAD AND TOMO ULTRASOUND RIGHT BREAST COMPARISON:  Previous exam(s). ACR Breast Density Category b: There are scattered areas of fibroglandular density. FINDINGS: No suspicious mass or malignant type microcalcifications identified in the left breast. There is a 1.4 cm spiculated mass in the lower-inner quadrant of the right breast and a 1.2 cm mass in the upper outer quadrant of the right breast (palpable mass). There are no malignant type microcalcifications. Mammographic images were processed with CAD. On physical exam, I palpate a mass in the right breast at 9:30 11 cm from the nipple. I palpate thickening in the right breast at 5:30 1-2 cm from the nipple. Targeted ultrasound is performed, showing an enlarged lymph node at 9:30 11 cm from the nipple accounting for the palpable abnormality measuring 1.0 x 1.1 x 1.0 cm. 1.6 cm lateral is a larger abnormal lymph node measuring 2.0 x 1.3 x 0.9 cm. There is a third benign appearing lymph node 6 x 5 x 7 mm. Sonographic evaluation higher in the right axilla does not show any additional enlarged adenopathy. There is an irregular hypoechoic mass in the right breast at 5:30 1-2 cm from the nipple measuring 1.6 x 1.1 x 1.3 cm. There is an adjacent 5 mm hypoechoic mass that is likely a satellite  nodule. There is an irregular hypoechoic mass in the right breast at 5:30 in the retroareolar region measuring 0.6 x 0.6 x 0.9 cm. This lesion is located 1.6 cm from the larger mass. IMPRESSION: 1.  Suspicious mass in the 5:30 region of the right breast 1-2 cm from the nipple with adjacent 5 mm probable satellite mass. 2. Suspicious mass in the 5:30 retroareolar region of the right breast. 3. Two enlarged lymph nodes in the right breast at 9:30 11 cm from the nipple. RECOMMENDATION: 1. Three ultrasound-guided core biopsies is recommended. Ultrasound-guided core biopsies of the mass in the right breast at 5:30 1 to 2 cm from the nipple, mass at in the 530 region of the right breast in the retroareolar region and the larger of the 2 lymph nodes at 9:30 11 cm from the nipple is recommended. 2. The masses and lymph nodes are highly concerning for invasive mammary carcinoma with metastatic adenopathy. There is likely at least 1 satellite mass adjacent to the mass at 5:30 1 to 2 cm from the nipple. Further evaluation with MRI is recommended. I have discussed the findings and recommendations with the patient. If applicable, a reminder letter will be sent to the patient regarding the next appointment. BI-RADS CATEGORY  5: Highly suggestive of malignancy. Electronically Signed   By: Lillia Mountain M.D.   On: 01/15/2020 12:35   MM CLIP PLACEMENT RIGHT  Result Date: 01/17/2020 CLINICAL DATA:  Status post ultrasound-guided core needle biopsies of a 1.6 cm mass in the 5:30 o'clock position of the right breast, 1-2 cm from the nipple, 0.9 cm mass in 5:30 o'clock retroareolar right breast and 2.0 cm enlarged lymph node in the 9:30 o'clock position of the right breast, 11 cm from the nipple, in the axillary tail region. EXAM: DIAGNOSTIC RIGHT MAMMOGRAM POST ULTRASOUND BIOPSY X 3 COMPARISON:  Previous exam(s). FINDINGS: Mammographic images were obtained following ultrasound guided biopsy of the 2 right breast masses and enlarged lymph node described above. The biopsy marking clips are in expected positions at the sites of biopsy. IMPRESSION: Appropriate positioning of the twisted X shaped biopsy marking clip at the site of  biopsy in the 5:30 o'clock position of the right breast, middle depth, Q shaped biopsy marker clip in the 5:30 o'clock retroareolar right breast and Hologic Vision clip in the biopsied 2.0 cm lymph node in the axillary tail/inferior axillary region on the right. Final Assessment: Post Procedure Mammograms for Marker Placement Electronically Signed   By: Claudie Revering M.D.   On: 01/17/2020 12:01   [place] RT BREAST BX W LOC DEV 1ST LESION IMG BX SPEC US GUIDE  Addendum Date: 01/22/2020   ADDENDUM REPORT: 01/22/2020 13:42 ADDENDUM: Recommendation: Consider bilateral breast MRI to evaluate extent of breast disease. Additional recommendation prepared by Electa Sniff RN on 01/22/2020. Electronically Signed   By: Claudie Revering M.D.   On: 01/22/2020 13:42   Addendum Date: 01/22/2020   ADDENDUM REPORT: 01/22/2020 13:21 ADDENDUM: PATHOLOGY revealed: Site A. RIGHT BREAST, 5:30 2CMFN; ULTRASOUND-GUIDED BIOPSY: - INVASIVE MAMMARY CARCINOMA, NO SPECIAL TYPE. 12 mm in this sample. Grade 2. Ductal carcinoma in situ: Present, low-grade. Lymphovascular invasion: Not identified. Pathology results are CONCORDANT with imaging findings, per Dr. Claudie Revering PATHOLOGY revealed: Site B. RIGHT BREAST, RETROAREOLAR; ULTRASOUND-GUIDED BIOPSY: - DUCTAL CARCINOMA IN SITU. - NEGATIVE FOR INVASIVE CARCINOMA. Pathology results are CONCORDANT with imaging findings, per Dr. Claudie Revering. PATHOLOGY revealed: Site C. AXILLARY LYMPH NODE, 9:30 11 CMFN; ULTRASOUND-GUIDED BIOPSY: - INVASIVE MAMMARY CARCINOMA,  AS DESCRIBED ABOVE (SEE PART A). - LYMPH NODE TISSUE IS NOT IDENTIFIED IN THIS SAMPLE. Pathology results are CONCORDANT with imaging findings, per Dr. Claudie Revering. Pathology results and recommendations below were discussed with patient by telephone on 01/21/2020. Patient reported biopsy site within normal limits with slight tenderness at the site. Post biopsy care instructions were reviewed, questions were answered and my direct phone number was provided to  patient. Patient was instructed to call [facility] if any concerns or questions arise related to the biopsy. Recommendation: Surgical consultation for all three biopsied sites listed above. Request for surgical consultation was relayed to [place] and Tanya Nones RN at [facility] by Electa Sniff RN on 01/21/2020. Pathology results reported by Electa Sniff RN on 01/22/2020. Electronically Signed   By: Claudie Revering M.D.   On: 01/22/2020 13:21   Result Date: 01/22/2020 CLINICAL DATA:  1.6 cm mass in the 5:30 o'clock position of the right breast, 1-2 cm from the nipple, with imaging features highly suspicious for malignancy at recent mammography and ultrasound. There was also a 0.9 cm mass highly suspicious for malignancy in the 5:30 o'clock retroareolar right breast. There are also 3 abnormal appearing enlarged intramammary lymph nodes in the 9:30 o'clock position of the right breast, 11 cm from the nipple, measuring up to 2.0 cm in maximum diameter each. EXAM: ULTRASOUND GUIDED RIGHT BREAST CORE NEEDLE BIOPSY X 3 COMPARISON:  Previous exam(s). PROCEDURE: I met with the patient and we discussed the procedure of ultrasound-guided biopsy, including benefits and alternatives. We discussed the high likelihood of a successful procedure. We discussed the risks of the procedure, including infection, bleeding, tissue injury, clip migration, and inadequate sampling. Informed written consent was given. The usual time-out protocol was performed immediately prior to the procedure. SITE 1: 1.6 CM MASS IN THE 5:30 O'CLOCK POSITION OF THE RIGHT BREAST, 1-2 CM FROM THE NIPPLE Lesion quadrant: Lower inner quadrant Using sterile technique and 1% Lidocaine as local anesthetic, under direct ultrasound visualization, a 12 gauge spring-loaded device was used to perform biopsy of the recently demonstrated 1.6 cm mass in the 5:30 o'clock position of the right breast, 1-2 cm from the nipple, using a caudal  approach. At the conclusion of the procedure a twisted X shaped tissue marker clip was deployed into the biopsy cavity. SITE 2: 0.9 CM MASS IN THE 5:30 O'CLOCK RETROAREOLAR RIGHT BREAST Lesion quadrant: Lower inner quadrant Using sterile technique and 1% Lidocaine as local anesthetic, under direct ultrasound visualization, a 12 gauge spring-loaded device was used to perform biopsy of the recently demonstrated 0.9 cm mass in the 5:30 o'clock retroareolar right breast using a caudal approach. At the conclusion of the procedure a Q shaped tissue marker clip was deployed into the biopsy cavity. SITE 3: 2.0 CM LYMPH NODE IN THE 9:30 O'CLOCK POSITION OF THE RIGHT BREAST Lesion quadrant: Upper outer quadrant Using sterile technique and 1% Lidocaine as local anesthetic, under direct ultrasound visualization, a 12 gauge spring-loaded device was used to perform biopsy of the recently demonstrated 2.0 cm lymph node in the 9:30 o'clock position of the right breast, 11 cm from the nipple, using a caudal approach. At the conclusion of the procedure a [employer] tissue marker clip was deployed into the biopsy cavity. Follow up 2 view mammogram was performed and dictated separately. IMPRESSION: Ultrasound guided biopsy of a 1.6 cm mass in the 5:30 o'clock position of the right breast, 1-2 cm from the nipple, 0.9 cm  mass in the 5:30 o'clock retroareolar right breast and 2.0 cm lymph node in the 9:30 o'clock position of the right breast. No apparent complications. Electronically Signed: By: Claudie Revering M.D. On: 01/17/2020 11:56   [place] RT BREAST BX W LOC DEV EA ADD LESION IMG BX SPEC US GUIDE  Addendum Date: 01/22/2020   ADDENDUM REPORT: 01/22/2020 13:42 ADDENDUM: Recommendation: Consider bilateral breast MRI to evaluate extent of breast disease. Additional recommendation prepared by Electa Sniff RN on 01/22/2020. Electronically Signed   By: Claudie Revering M.D.   On: 01/22/2020 13:42   Addendum Date: 01/22/2020   ADDENDUM REPORT:  01/22/2020 13:21 ADDENDUM: PATHOLOGY revealed: Site A. RIGHT BREAST, 5:30 2CMFN; ULTRASOUND-GUIDED BIOPSY: - INVASIVE MAMMARY CARCINOMA, NO SPECIAL TYPE. 12 mm in this sample. Grade 2. Ductal carcinoma in situ: Present, low-grade. Lymphovascular invasion: Not identified. Pathology results are CONCORDANT with imaging findings, per Dr. Claudie Revering PATHOLOGY revealed: Site B. RIGHT BREAST, RETROAREOLAR; ULTRASOUND-GUIDED BIOPSY: - DUCTAL CARCINOMA IN SITU. - NEGATIVE FOR INVASIVE CARCINOMA. Pathology results are CONCORDANT with imaging findings, per Dr. Claudie Revering. PATHOLOGY revealed: Site C. AXILLARY LYMPH NODE, 9:30 11 CMFN; ULTRASOUND-GUIDED BIOPSY: - INVASIVE MAMMARY CARCINOMA, AS DESCRIBED ABOVE (SEE PART A). - LYMPH NODE TISSUE IS NOT IDENTIFIED IN THIS SAMPLE. Pathology results are CONCORDANT with imaging findings, per Dr. Claudie Revering. Pathology results and recommendations below were discussed with patient by telephone on 01/21/2020. Patient reported biopsy site within normal limits with slight tenderness at the site. Post biopsy care instructions were reviewed, questions were answered and my direct phone number was provided to patient. Patient was instructed to call [facility] if any concerns or questions arise related to the biopsy. Recommendation: Surgical consultation for all three biopsied sites listed above. Request for surgical consultation was relayed to [place] and Tanya Nones RN at [facility] by Electa Sniff RN on 01/21/2020. Pathology results reported by Electa Sniff RN on 01/22/2020. Electronically Signed   By: Claudie Revering M.D.   On: 01/22/2020 13:21   Result Date: 01/22/2020 CLINICAL DATA:  1.6 cm mass in the 5:30 o'clock position of the right breast, 1-2 cm from the nipple, with imaging features highly suspicious for malignancy at recent mammography and ultrasound. There was also a 0.9 cm mass highly suspicious for malignancy in the 5:30 o'clock retroareolar  right breast. There are also 3 abnormal appearing enlarged intramammary lymph nodes in the 9:30 o'clock position of the right breast, 11 cm from the nipple, measuring up to 2.0 cm in maximum diameter each. EXAM: ULTRASOUND GUIDED RIGHT BREAST CORE NEEDLE BIOPSY X 3 COMPARISON:  Previous exam(s). PROCEDURE: I met with the patient and we discussed the procedure of ultrasound-guided biopsy, including benefits and alternatives. We discussed the high likelihood of a successful procedure. We discussed the risks of the procedure, including infection, bleeding, tissue injury, clip migration, and inadequate sampling. Informed written consent was given. The usual time-out protocol was performed immediately prior to the procedure. SITE 1: 1.6 CM MASS IN THE 5:30 O'CLOCK POSITION OF THE RIGHT BREAST, 1-2 CM FROM THE NIPPLE Lesion quadrant: Lower inner quadrant Using sterile technique and 1% Lidocaine as local anesthetic, under direct ultrasound visualization, a 12 gauge spring-loaded device was used to perform biopsy of the recently demonstrated 1.6 cm mass in the 5:30 o'clock position of the right breast, 1-2 cm from the nipple, using a caudal approach. At the conclusion of the procedure a twisted X shaped tissue marker clip was deployed into the  biopsy cavity. SITE 2: 0.9 CM MASS IN THE 5:30 O'CLOCK RETROAREOLAR RIGHT BREAST Lesion quadrant: Lower inner quadrant Using sterile technique and 1% Lidocaine as local anesthetic, under direct ultrasound visualization, a 12 gauge spring-loaded device was used to perform biopsy of the recently demonstrated 0.9 cm mass in the 5:30 o'clock retroareolar right breast using a caudal approach. At the conclusion of the procedure a Q shaped tissue marker clip was deployed into the biopsy cavity. SITE 3: 2.0 CM LYMPH NODE IN THE 9:30 O'CLOCK POSITION OF THE RIGHT BREAST Lesion quadrant: Upper outer quadrant Using sterile technique and 1% Lidocaine as local anesthetic, under direct ultrasound  visualization, a 12 gauge spring-loaded device was used to perform biopsy of the recently demonstrated 2.0 cm lymph node in the 9:30 o'clock position of the right breast, 11 cm from the nipple, using a caudal approach. At the conclusion of the procedure a [employer] tissue marker clip was deployed into the biopsy cavity. Follow up 2 view mammogram was performed and dictated separately. IMPRESSION: Ultrasound guided biopsy of a 1.6 cm mass in the 5:30 o'clock position of the right breast, 1-2 cm from the nipple, 0.9 cm mass in the 5:30 o'clock retroareolar right breast and 2.0 cm lymph node in the 9:30 o'clock position of the right breast. No apparent complications. Electronically Signed: By: Claudie Revering M.D. On: 01/17/2020 11:56   [place] RT BREAST BX W LOC DEV EA ADD LESION IMG BX SPEC US GUIDE  Addendum Date: 01/22/2020   ADDENDUM REPORT: 01/22/2020 13:42 ADDENDUM: Recommendation: Consider bilateral breast MRI to evaluate extent of breast disease. Additional recommendation prepared by Electa Sniff RN on 01/22/2020. Electronically Signed   By: Claudie Revering M.D.   On: 01/22/2020 13:42   Addendum Date: 01/22/2020   ADDENDUM REPORT: 01/22/2020 13:21 ADDENDUM: PATHOLOGY revealed: Site A. RIGHT BREAST, 5:30 2CMFN; ULTRASOUND-GUIDED BIOPSY: - INVASIVE MAMMARY CARCINOMA, NO SPECIAL TYPE. 12 mm in this sample. Grade 2. Ductal carcinoma in situ: Present, low-grade. Lymphovascular invasion: Not identified. Pathology results are CONCORDANT with imaging findings, per Dr. Claudie Revering PATHOLOGY revealed: Site B. RIGHT BREAST, RETROAREOLAR; ULTRASOUND-GUIDED BIOPSY: - DUCTAL CARCINOMA IN SITU. - NEGATIVE FOR INVASIVE CARCINOMA. Pathology results are CONCORDANT with imaging findings, per Dr. Claudie Revering. PATHOLOGY revealed: Site C. AXILLARY LYMPH NODE, 9:30 11 CMFN; ULTRASOUND-GUIDED BIOPSY: - INVASIVE MAMMARY CARCINOMA, AS DESCRIBED ABOVE (SEE PART A). - LYMPH NODE TISSUE IS NOT IDENTIFIED IN THIS SAMPLE. Pathology results are  CONCORDANT with imaging findings, per Dr. Claudie Revering. Pathology results and recommendations below were discussed with patient by telephone on 01/21/2020. Patient reported biopsy site within normal limits with slight tenderness at the site. Post biopsy care instructions were reviewed, questions were answered and my direct phone number was provided to patient. Patient was instructed to call [facility] if any concerns or questions arise related to the biopsy. Recommendation: Surgical consultation for all three biopsied sites listed above. Request for surgical consultation was relayed to [place] and Tanya Nones RN at [facility] by Electa Sniff RN on 01/21/2020. Pathology results reported by Electa Sniff RN on 01/22/2020. Electronically Signed   By: Claudie Revering M.D.   On: 01/22/2020 13:21   Result Date: 01/22/2020 CLINICAL DATA:  1.6 cm mass in the 5:30 o'clock position of the right breast, 1-2 cm from the nipple, with imaging features highly suspicious for malignancy at recent mammography and ultrasound. There was also a 0.9 cm mass highly suspicious for malignancy in the 5:30  o'clock retroareolar right breast. There are also 3 abnormal appearing enlarged intramammary lymph nodes in the 9:30 o'clock position of the right breast, 11 cm from the nipple, measuring up to 2.0 cm in maximum diameter each. EXAM: ULTRASOUND GUIDED RIGHT BREAST CORE NEEDLE BIOPSY X 3 COMPARISON:  Previous exam(s). PROCEDURE: I met with the patient and we discussed the procedure of ultrasound-guided biopsy, including benefits and alternatives. We discussed the high likelihood of a successful procedure. We discussed the risks of the procedure, including infection, bleeding, tissue injury, clip migration, and inadequate sampling. Informed written consent was given. The usual time-out protocol was performed immediately prior to the procedure. SITE 1: 1.6 CM MASS IN THE 5:30 O'CLOCK POSITION OF THE RIGHT BREAST,  1-2 CM FROM THE NIPPLE Lesion quadrant: Lower inner quadrant Using sterile technique and 1% Lidocaine as local anesthetic, under direct ultrasound visualization, a 12 gauge spring-loaded device was used to perform biopsy of the recently demonstrated 1.6 cm mass in the 5:30 o'clock position of the right breast, 1-2 cm from the nipple, using a caudal approach. At the conclusion of the procedure a twisted X shaped tissue marker clip was deployed into the biopsy cavity. SITE 2: 0.9 CM MASS IN THE 5:30 O'CLOCK RETROAREOLAR RIGHT BREAST Lesion quadrant: Lower inner quadrant Using sterile technique and 1% Lidocaine as local anesthetic, under direct ultrasound visualization, a 12 gauge spring-loaded device was used to perform biopsy of the recently demonstrated 0.9 cm mass in the 5:30 o'clock retroareolar right breast using a caudal approach. At the conclusion of the procedure a Q shaped tissue marker clip was deployed into the biopsy cavity. SITE 3: 2.0 CM LYMPH NODE IN THE 9:30 O'CLOCK POSITION OF THE RIGHT BREAST Lesion quadrant: Upper outer quadrant Using sterile technique and 1% Lidocaine as local anesthetic, under direct ultrasound visualization, a 12 gauge spring-loaded device was used to perform biopsy of the recently demonstrated 2.0 cm lymph node in the 9:30 o'clock position of the right breast, 11 cm from the nipple, using a caudal approach. At the conclusion of the procedure a [employer] tissue marker clip was deployed into the biopsy cavity. Follow up 2 view mammogram was performed and dictated separately. IMPRESSION: Ultrasound guided biopsy of a 1.6 cm mass in the 5:30 o'clock position of the right breast, 1-2 cm from the nipple, 0.9 cm mass in the 5:30 o'clock retroareolar right breast and 2.0 cm lymph node in the 9:30 o'clock position of the right breast. No apparent complications. Electronically Signed: By: Claudie Revering M.D. On: 01/17/2020 11:56     ELIGIBLE FOR AVAILABLE RESEARCH PROTOCOL:  AET  ASSESSMENT: 49 y.o. [place] woman status post right breast lower outer quadrant biopsy 01/17/2020  for a clinical T1c N1, stage IB invasive mammary carcinoma, grade 2, estrogen and progesterone receptor positive, HER-2 not amplified  (a) biopsy of the left axillary "lymph node" was positive but included no lymph node tissue  (b) biopsy of a suspicious periareoral area was positive for ductal carcinoma in situ, grade 1  (c) biopsy of a left breast LOQ mass noted on 02/06/2020 breast MRI pending  (d) CT chest and bone scan 02/11/2020 show no evidence of metastatic disease  (1) genetics testing in process  (2) MammaPrint obtained from the 01/17/2020 biopsy read as "low risk" predicting a 96% chance of distant disease free survival at 5 years without the need for chemotherapy  (3) tamoxifen started 02/13/2020 in anticipation of surgical delays  (4) definitive surgery pending  (5) adjuvant radiation  to follow    PLAN: Reine has met with Dr Marlou Starks and is planning on right mastectomy. She will meet with plastics in the near future to discuss reconstruction options. She should have her genetics results within a week or so and if she does carry a deleterious mutation she would consider bilateral mastectomies.   Given that definitive surgery maybe a few weeks off we discussed antiestrogens. She understands that anastrozole and the aromatase inhibitors in general work by blocking estrogen production. Accordingly vaginal dryness, decrease in bone density, and of course hot flashes can result. The aromatase inhibitors can also negatively affect the cholesterol profile, although that is a minor effect. One out of 5 women on aromatase inhibitors we will feel "old and achy". This arthralgia/myalgia syndrome, which resembles fibromyalgia clinically, does resolve with stopping the medications. Accordingly this is not a reason to not try an aromatase inhibitor but it is a frequent reason to stop it (in  other words 20% of women will not be able to tolerate these medications).  Tamoxifen on the other hand does not block estrogen production. It does not "take away a woman's estrogen". It blocks the estrogen receptor in breast cells. Like anastrozole, it can also cause hot flashes. As opposed to anastrozole, tamoxifen has many estrogen-like effects. It is technically an estrogen receptor modulator. This means that in some tissues tamoxifen works like estrogen-- for example it helps strengthen the bones. It tends to improve the cholesterol profile. It can cause thickening of the endometrial lining, and even endometrial polyps or rarely cancer of the uterus.(The risk of uterine cancer due to tamoxifen is one additional cancer per thousand women year). It can cause vaginal wetness or stickiness. It can cause blood clots through this estrogen-like effect--the risk of blood clots with tamoxifen is exactly the same as with birth control pills or hormone replacement.  Neither of these agents causes mood changes or weight gain, despite the popular belief that they can have these side effects. We have data from studies comparing either of these drugs with placebo, and in those cases the control group had the same amount of weight gain and depression as the group that took the drug.  Given her age and the fact that she has a Mirena IUD in place we decided on tamoxifen and she will start that today. She will stop it one week prior to her surgery and resume 2 days afterwards. As far as contraception is concerned, a Mirena works well with tamoxifen--lowers the risk of endometrial complications--but she is thinking of having it removed (it's overdue) and simply waiting to see if her periods resume. She does understand that tamoxifen is not a contraceptive.  She will call if she has any problems from this medication. Otherwise I will see her after he definitive surgery.  Total encounter time 40 minutes.Sarajane Jews C.  Maryam Feely, MD 02/13/2020 6:33 PM Medical Oncology and Hematology [facility] [place], [place] [address] Tel. [phone]    Fax. [phone]   This document serves as a record of services personally performed by Lurline Del, MD. It was created on his behalf by Wilburn Mylar, a trained medical scribe. The creation of this record is based on the scribe's personal observations and the provider's statements to them.   I, Lurline Del MD, have reviewed the above documentation for accuracy and completeness, and I agree with the above.   *Total Encounter Time as defined by the Centers for Medicare and  Medicaid Services includes, in addition to the face-to-face time of a patient visit (documented in the note above) non-face-to-face time: obtaining and reviewing outside history, ordering and reviewing medications, tests or procedures, care coordination (communications with other health care professionals or caregivers) and documentation in the medical record.

## 2020-02-13 ENCOUNTER — Inpatient Hospital Stay (HOSPITAL_BASED_OUTPATIENT_CLINIC_OR_DEPARTMENT_OTHER): Payer: No Typology Code available for payment source | Admitting: Oncology

## 2020-02-13 ENCOUNTER — Other Ambulatory Visit: Payer: Self-pay

## 2020-02-13 ENCOUNTER — Encounter: Payer: Self-pay | Admitting: *Deleted

## 2020-02-13 VITALS — BP 128/75 | HR 66 | Temp 97.1°F | Resp 17 | Ht 63.0 in | Wt 174.4 lb

## 2020-02-13 DIAGNOSIS — Z170 Estrogen receptor positive status [ER+]: Secondary | ICD-10-CM

## 2020-02-13 DIAGNOSIS — K760 Fatty (change of) liver, not elsewhere classified: Secondary | ICD-10-CM | POA: Diagnosis not present

## 2020-02-13 DIAGNOSIS — C50511 Malignant neoplasm of lower-outer quadrant of right female breast: Secondary | ICD-10-CM | POA: Diagnosis not present

## 2020-02-13 MED ORDER — TAMOXIFEN CITRATE 20 MG PO TABS: 20.0000 mg | ORAL_TABLET | Freq: Every day | ORAL | 12 refills | Status: AC

## 2020-02-14 ENCOUNTER — Other Ambulatory Visit: Payer: No Typology Code available for payment source

## 2020-02-14 ENCOUNTER — Telehealth: Payer: Self-pay | Admitting: Oncology

## 2020-02-14 NOTE — Telephone Encounter (Signed)
Scheduled appts per 9/30 los. Pt confirmed appt date and time.

## 2020-02-18 ENCOUNTER — Encounter: Payer: Self-pay | Admitting: Plastic Surgery

## 2020-02-18 ENCOUNTER — Ambulatory Visit (INDEPENDENT_AMBULATORY_CARE_PROVIDER_SITE_OTHER): Payer: No Typology Code available for payment source | Admitting: Plastic Surgery

## 2020-02-18 ENCOUNTER — Other Ambulatory Visit: Payer: Self-pay

## 2020-02-18 VITALS — BP 128/88 | HR 72 | Temp 98.1°F | Ht 63.0 in | Wt 173.0 lb

## 2020-02-18 DIAGNOSIS — C50511 Malignant neoplasm of lower-outer quadrant of right female breast: Secondary | ICD-10-CM

## 2020-02-18 DIAGNOSIS — F419 Anxiety disorder, unspecified: Secondary | ICD-10-CM | POA: Diagnosis not present

## 2020-02-18 DIAGNOSIS — R7989 Other specified abnormal findings of blood chemistry: Secondary | ICD-10-CM

## 2020-02-18 DIAGNOSIS — E119 Type 2 diabetes mellitus without complications: Secondary | ICD-10-CM | POA: Diagnosis not present

## 2020-02-18 DIAGNOSIS — Z170 Estrogen receptor positive status [ER+]: Secondary | ICD-10-CM

## 2020-02-18 NOTE — Progress Notes (Signed)
Patient ID: Christy Ferrell, female    DOB: Apr 12, 1971, 50 y.o.   MRN: [id number]   Chief Complaint  Patient presents with  . Consult  . Breast Cancer    The patient is a 49 year old female here for a consultation for breast reconstruction.  The patient was diagnosed with right-sided breast cancer.  She felt a mass in the right axilla and underwent a mammogram and ultrasound.  She was found to have a 1.6 mass in the lower inner quadrant of the right breast with a 5 mm nodule.  She also had a 1 cm mass in the subareolar right breast with 2 abnormal lymph nodes by exam.  They were biopsied and showed invasive breast cancer and ductal carcinoma in situ.  The lymph nodes were positive and the path showed estrogen and progesterone positive and HER-2 negative.  She does have a family history of breast cancer in a cousin.  She is not a smoker but does have diabetes, anxiety, cholelithiasis and hypothyroidism.  She has had a C-section and a cholecystectomy.  She has 2 sons.  She is planning on a right mastectomy.  She is 5 feet 3 inches tall and weighs 173 pounds.  Her preoperative bra is a 38 D/DD.   Review of Systems  Constitutional: Negative.  Negative for activity change and appetite change.  HENT: Negative.   Eyes: Negative.   Respiratory: Negative.   Cardiovascular: Negative.   Gastrointestinal: Negative.   Endocrine: Negative.   Genitourinary: Negative.   Musculoskeletal: Negative.   Neurological: Negative.   Hematological: Negative.   Psychiatric/Behavioral: Negative.     Past Medical History:  Diagnosis Date  . Anxiety    h/o, was prev on zoloft  . Breast cancer ([place]) 01/2020   seeing Dr. Marlou Starks  . Cholelithiasis   . Diabetes mellitus without complication ([place])   . Hypothyroidism    postpartum hypothyroidism  . IUD (intrauterine device) in place    mirena per [employer]  . Major depression     Past Surgical History:  Procedure Laterality Date  . CESAREAN SECTION       X 2 Fetal stress, second scheduled  . CHOLECYSTECTOMY  05/1998      Current Outpatient Medications:  .  acyclovir ointment (ZOVIRAX) 5 %, Apply 1 application topically every 3 (three) hours. As needed., Disp: 5 g, Rfl: 5 .  cyclobenzaprine (FLEXERIL) 10 MG tablet, TAKE 1/2 TO 1 TABLET BY MOUTH EVERY DAY AS NEEDED, Disp: 30 tablet, Rfl: 5 .  escitalopram (LEXAPRO) 10 MG tablet, Take 1 tablet (10 mg total) by mouth daily., Disp: 90 tablet, Rfl: 3 .  ibuprofen (ADVIL,MOTRIN) 200 MG tablet, Take 200 mg by mouth every 6 (six) hours as needed., Disp: , Rfl:  .  levonorgestrel (MIRENA) 20 MCG/24HR IUD, 1 each by Intrauterine route once. 11/05/12, Disp: , Rfl:  .  levothyroxine (SYNTHROID) 25 MCG tablet, Take 1 tablet (25 mcg total) by mouth daily before breakfast., Disp: 90 tablet, Rfl: 3 .  tamoxifen (NOLVADEX) 20 MG tablet, Take 1 tablet (20 mg total) by mouth daily., Disp: 90 tablet, Rfl: 12 .  valACYclovir (VALTREX) 1000 MG tablet, Take 2 tablets (2,000 mg total) by mouth 2 (two) times daily as needed. For 1 day., Disp: 20 tablet, Rfl: 5   Objective:   Vitals:   02/18/20 0848  BP: 128/88  Pulse: 72  Temp: 98.1 F (36.7 C)  SpO2: 100%    Physical Exam Vitals and nursing  note reviewed.  Constitutional:      Appearance: Normal appearance.  HENT:     Head: Normocephalic and atraumatic.  Cardiovascular:     Rate and Rhythm: Normal rate.     Pulses: Normal pulses.  Pulmonary:     Effort: Pulmonary effort is normal.  Abdominal:     General: Abdomen is flat. There is no distension.     Tenderness: There is no abdominal tenderness.  Musculoskeletal:        General: No swelling.  Skin:    General: Skin is warm.     Capillary Refill: Capillary refill takes less than 2 seconds.     Coloration: Skin is not jaundiced.  Neurological:     General: No focal deficit present.     Mental Status: She is alert and oriented to person, place, and time.  Psychiatric:        Mood and Affect:  Mood normal.        Behavior: Behavior normal.        Thought Content: Thought content normal.     Assessment & Plan:  Malignant neoplasm of lower-outer quadrant of right breast of female, estrogen receptor positive ([place])  Diabetes mellitus without complication ([place]) We had a detailed conversation about the patient's options for breast reconstruction. Several reconstruction options were explained to the patient.  It is important to remember that breast reconstruction is an optional procedure. Reconstruction often requires several stages of surgery and this means more than one operation.  The surgeries are often done several months apart.  The entire process from start to finish can take a year or more. The major goal of breast reconstruction is to look normal in clothing. There will always be scars and a difference noticeable without clothes.  This is true for asymmetries where both breasts will not be identical.  Surgery may be needed or desired to the non-cancerous breast in order to achieve better symmetry and satisfactory results.  Regardless of the reconstructive method, there is always risks and the possibility that the procedure will fail or have complications.  This couls required additional surgeries.    We discussed the available methods of breast reconstruction and included:  1. Tissue expander with Acellular dermal matrix followed by implant based reconstruction. This can be done as one surgery or multiple surgeries.  2. Autologous reconstruction can include using a muscle or tissue from another area of the body for the reconstruction.  3. Combined procedures like the latissismus dorsi flaps that often uses the muscle with an expander or implant.  For each of the method discussed the risks, benefits, scars and recovery time were discussed in detail. Specific risks included bleeding, infection, hematoma, seroma, scarring, pain, wound healing complications, flap loss, fat necrosis,  capsular contracture, need for implant removal, donor site complications, bulge, hernia, umbilical necrosis, need for urgent reoperation, and need for dressing changes.   After the options were discussed we focused on the patient's desires and the procedure that was best for her based on all the information.  A total of 45 minutes of face-to-face time was spent in this encounter, of which >70% was spent in counseling.   The patient is scheduled for a left breast biopsy tomorrow. She is also waiting for her genetic results. If the biopsy or the genetic results are positive then she will be interested in bilateral mastectomies. For if the biopsy is positive perhaps a oncologic breast reduction. She is interested in bilateral breast reconstruction if needed with  expander and implant. Otherwise a right mastectomy with implant reconstruction and a left mastopexy or a right mastectomy with implant reconstruction and a left oncologic breast reduction.   I have spoken with Dr. Marlou Starks. I described all the information as listed above with him. I have reviewed her records personally. We will plan on a telemetry visit in 1 to 2 weeks when she has the results of her biopsy and genetics.  Pictures were obtained of the patient and placed in the chart with the patient's or guardian's permission.   [place], DO

## 2020-02-19 ENCOUNTER — Ambulatory Visit
Admission: RE | Admit: 2020-02-19 | Discharge: 2020-02-19 | Disposition: A | Discharge status: Home / Self Care | Payer: No Typology Code available for payment source | Source: Ambulatory Visit | Attending: Oncology | Admitting: Oncology

## 2020-02-19 ENCOUNTER — Ambulatory Visit: Admission: RE | Admit: 2020-02-19 | Payer: No Typology Code available for payment source | Source: Ambulatory Visit

## 2020-02-19 ENCOUNTER — Other Ambulatory Visit: Payer: Self-pay | Admitting: Oncology

## 2020-02-19 DIAGNOSIS — Z170 Estrogen receptor positive status [ER+]: Secondary | ICD-10-CM

## 2020-02-19 DIAGNOSIS — C50511 Malignant neoplasm of lower-outer quadrant of right female breast: Secondary | ICD-10-CM

## 2020-02-19 IMAGING — MR MR BREAST*L* WO/W CM
6 of 8 series · 34 of 48 positions shown · IV contrast (8 ml gadavist)
Comparison: Previous exam(s).

CLINICAL DATA: 49-year-old female presents for tissue sampling of
0.5 cm mass within the LOWER INNER LEFT breast identified on recent
MR. Recently diagnosed invasive mammary carcinoma/DCIS within the
LOWER INNER RIGHT breast, DCIS within the RETROAREOLAR RIGHT breast
and metastasis within a RIGHT axillary lymph node.

LABS:  None performed today
EXAM:
MR OF THE LEFT BREAST WITH AND WITHOUT CONTRAST
TECHNIQUE: Multiplanar multisequence MR images of the left breast were obtained
prior to and following the intravenous administration of 8 ml of
Gadavist.

[Series 2: fiducial unilateral · sagittal · 2.0mm · 1.33mm/px · 1 of 52 slices shown]
[im 1/52]
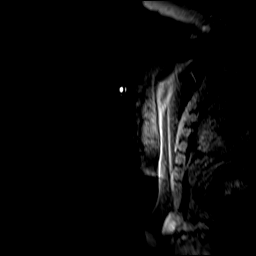

[Series 3: dynamic pre · axial · non-contrast · 1.3mm · 0.73mm/px · z∈[-66,+120]mm · 6 of 144 slices shown]
[im 1/144]
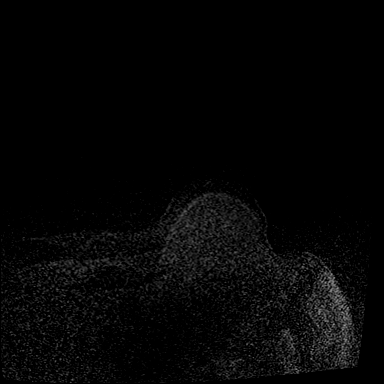
[im 29/144]
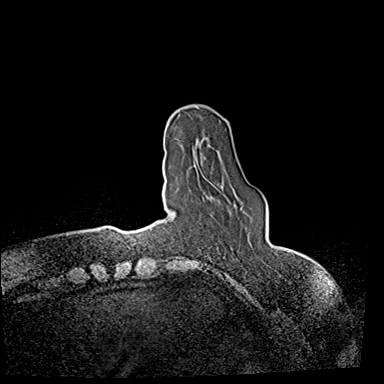
[im 58/144]
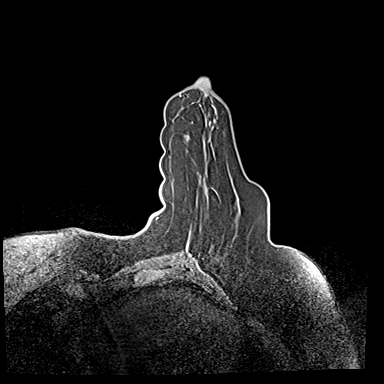
[im 86/144]
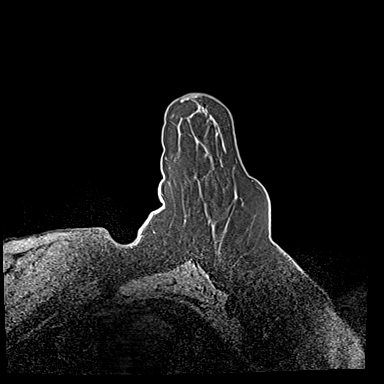
[im 115/144]
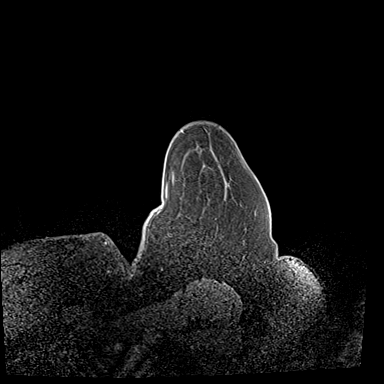
[im 144/144]
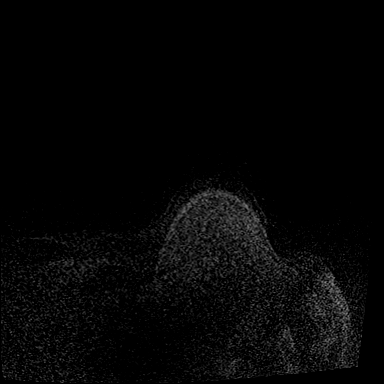

[Series 4: dynamic post 20 · axial · 1.3mm · 0.73mm/px · z∈[-66,+120]mm · 6 of 144 slices shown (1 of 2)]
[im 1/144]
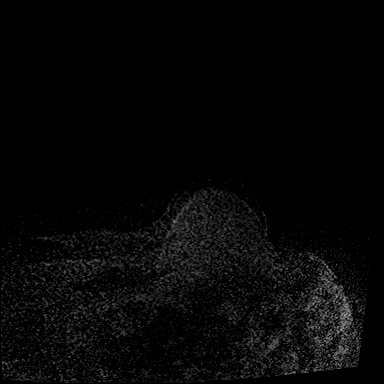
[im 29/144]
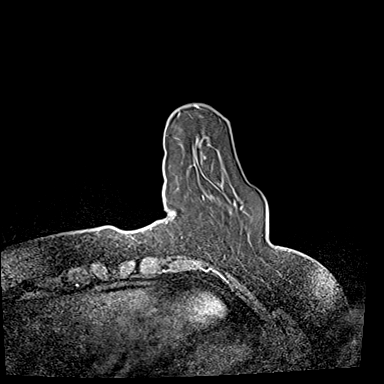
[im 58/144]
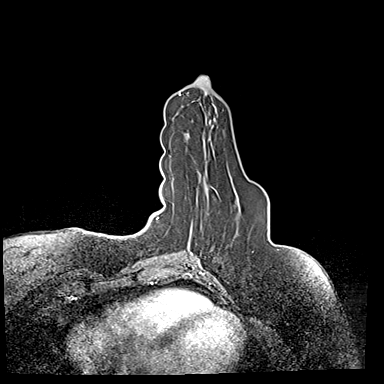
[im 86/144]
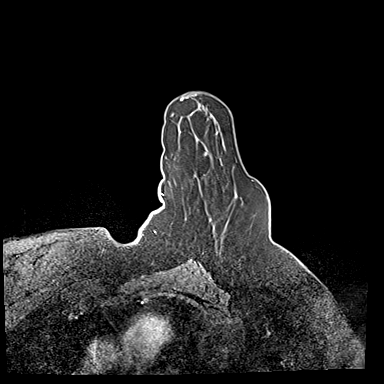
[im 115/144]
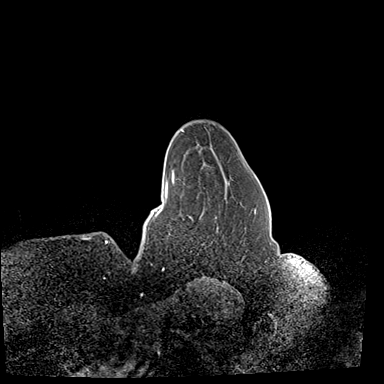
[im 144/144]
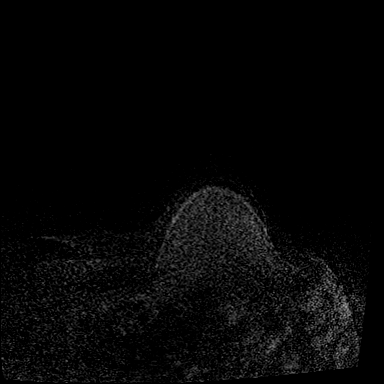

[Series 5: dynamic post 20 · axial · 1.3mm · 0.73mm/px · z∈[-66,+120]mm · 7 of 144 slices shown (2 of 2)]
[im 1/144]
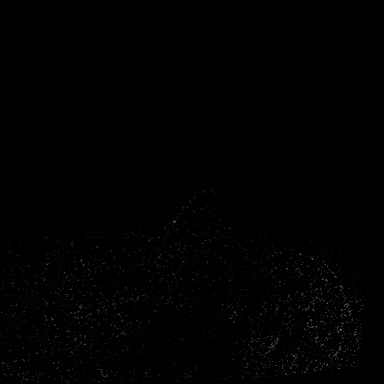
[im 24/144]
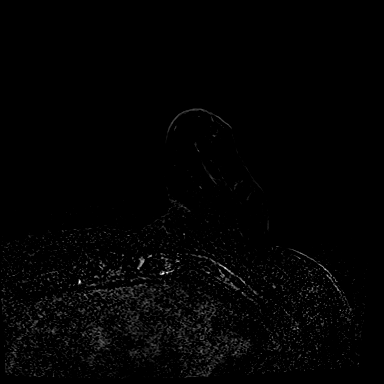
[im 48/144]
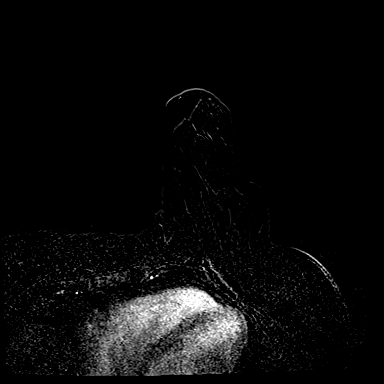
[im 72/144]
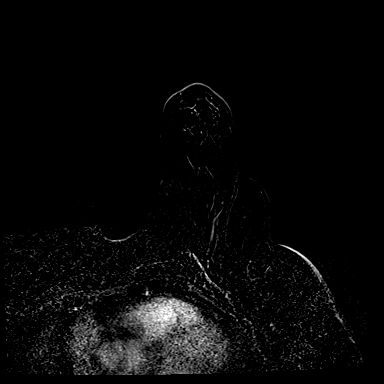
[im 96/144]
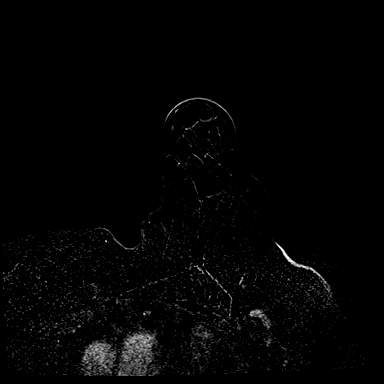
[im 120/144]
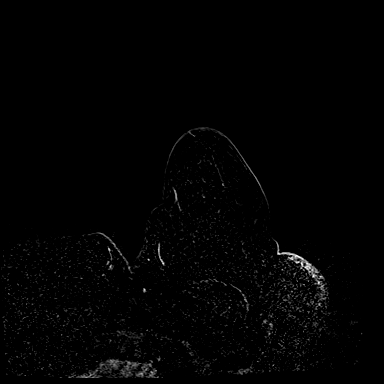
[im 144/144]
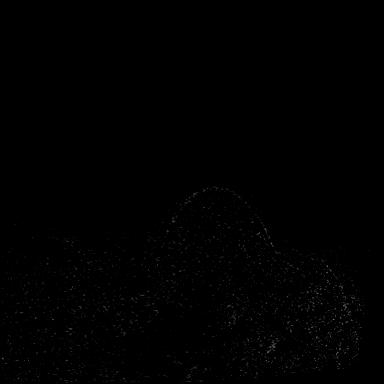

[Series 6: dynamic post 3 · axial · 1.3mm · 0.73mm/px · z∈[-66,+120]mm · 7 of 144 slices shown (1 of 2)]
[im 1/144]
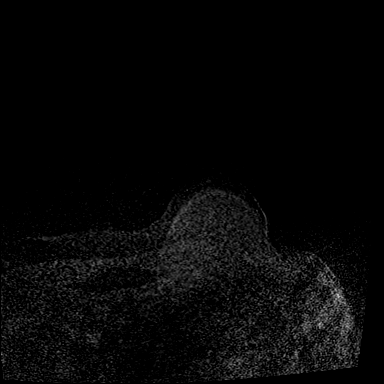
[im 24/144]
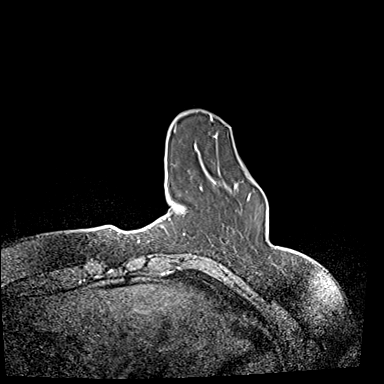
[im 48/144]
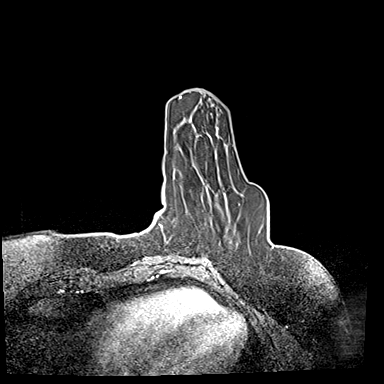
[im 72/144]
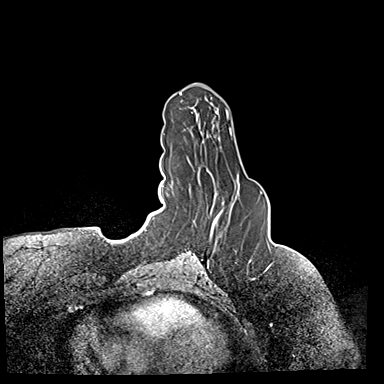
[im 96/144]
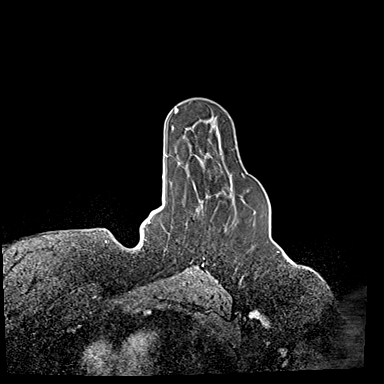
[im 120/144]
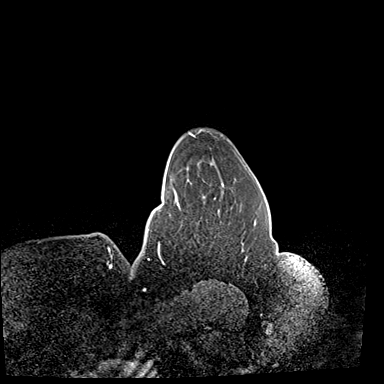
[im 144/144]
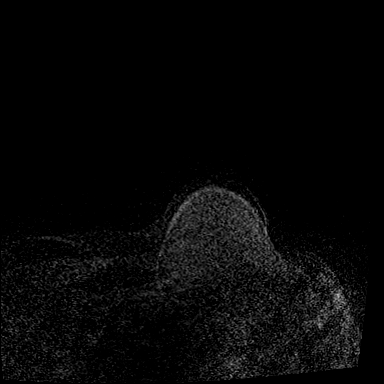

[Series 7: dynamic post 3 · axial · 1.3mm · 0.73mm/px · z∈[-66,+120]mm · 7 of 144 slices shown (2 of 2)]
[im 1/144]
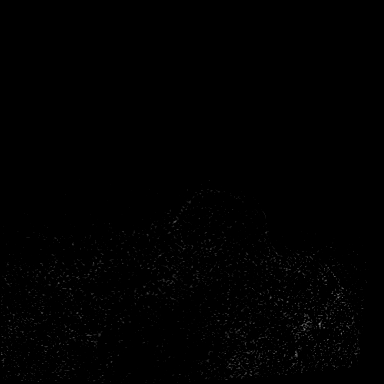
[im 24/144]
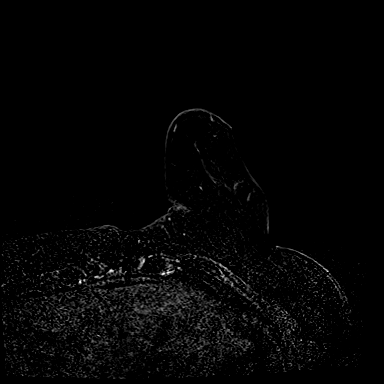
[im 48/144]
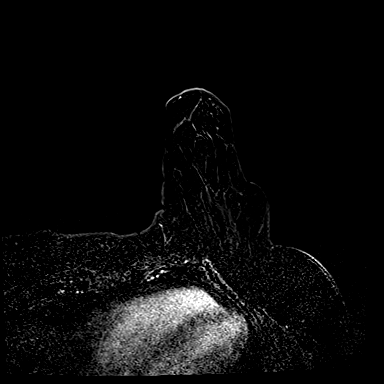
[im 72/144]
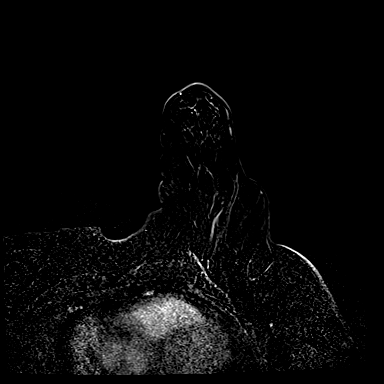
[im 96/144]
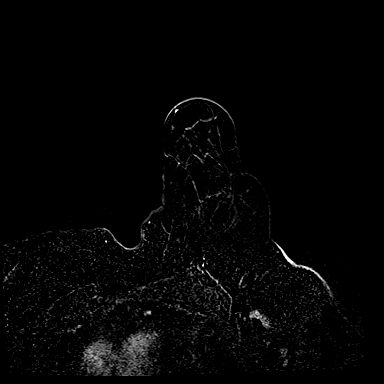
[im 120/144]
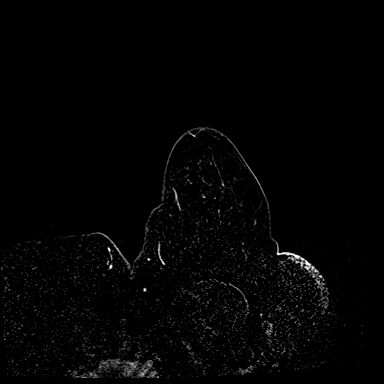
[im 144/144]
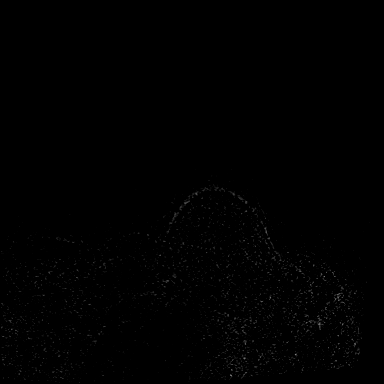

[34 of 48 positions shown; findings below may reference images not displayed]

Three-dimensional MR images were rendered by post-processing of the
original MR data on an independent workstation. The
three-dimensional MR images were interpreted, and findings are
reported in the following complete MRI report for this study. Three
dimensional images were evaluated at the independent [employer]
workstation
FINDINGS: Breast composition: b. Scattered fibroglandular tissue.

Background parenchymal enhancement: Mild

Left breast: The 0.5 cm LOWER INNER LEFT breast mass identified on
recent MRI is not visualized today. The only abnormality in this
general area is a 0.2 cm focus. No suspicious abnormalities are
identified within the LOWER INNER LEFT breast. MR guided biopsy was
not performed.

Lymph nodes: No abnormal appearing LEFT axillary lymph nodes.

Ancillary findings:  None.
IMPRESSION: 1. MR guided LEFT breast biopsy was not performed as the 0.5 cm
LOWER INNER LEFT breast mass identified on recent MRI is not
visualized today. If LEFT mastectomy is not performed then six-month
MR follow-up is recommended for follow-up.

RECOMMENDATION:
LEFT breast MRI in 6 months if LEFT mastectomy not performed.

Treatment plan for RIGHT breast cancer.

BI-RADS CATEGORY  6: Known biopsy-proven malignancy.

## 2020-02-19 MED ORDER — GADOBUTROL 1 MMOL/ML IV SOLN
8.0000 mL | Freq: Once | INTRAVENOUS | Status: AC | PRN
  Administered 2020-02-19: 8 mL via INTRAVENOUS

## 2020-02-20 ENCOUNTER — Other Ambulatory Visit: Payer: No Typology Code available for payment source

## 2020-02-20 ENCOUNTER — Inpatient Hospital Stay: Payer: No Typology Code available for payment source | Attending: Oncology | Admitting: Genetic Counselor

## 2020-02-20 ENCOUNTER — Other Ambulatory Visit: Payer: Self-pay

## 2020-02-20 DIAGNOSIS — Z170 Estrogen receptor positive status [ER+]: Secondary | ICD-10-CM

## 2020-02-20 DIAGNOSIS — Z8042 Family history of malignant neoplasm of prostate: Secondary | ICD-10-CM

## 2020-02-20 DIAGNOSIS — Z806 Family history of leukemia: Secondary | ICD-10-CM

## 2020-02-20 DIAGNOSIS — Z800 Family history of malignant neoplasm of digestive organs: Secondary | ICD-10-CM

## 2020-02-20 DIAGNOSIS — C50511 Malignant neoplasm of lower-outer quadrant of right female breast: Secondary | ICD-10-CM | POA: Diagnosis not present

## 2020-02-20 DIAGNOSIS — Z801 Family history of malignant neoplasm of trachea, bronchus and lung: Secondary | ICD-10-CM

## 2020-02-20 DIAGNOSIS — Z803 Family history of malignant neoplasm of breast: Secondary | ICD-10-CM

## 2020-02-21 ENCOUNTER — Ambulatory Visit: Payer: Self-pay | Admitting: General Surgery

## 2020-02-21 ENCOUNTER — Encounter: Payer: Self-pay | Admitting: *Deleted

## 2020-02-21 ENCOUNTER — Encounter: Payer: Self-pay | Admitting: Genetic Counselor

## 2020-02-21 DIAGNOSIS — Z806 Family history of leukemia: Secondary | ICD-10-CM | POA: Insufficient documentation

## 2020-02-21 DIAGNOSIS — Z8042 Family history of malignant neoplasm of prostate: Secondary | ICD-10-CM | POA: Insufficient documentation

## 2020-02-21 DIAGNOSIS — Z801 Family history of malignant neoplasm of trachea, bronchus and lung: Secondary | ICD-10-CM | POA: Insufficient documentation

## 2020-02-21 DIAGNOSIS — Z800 Family history of malignant neoplasm of digestive organs: Secondary | ICD-10-CM | POA: Insufficient documentation

## 2020-02-21 DIAGNOSIS — Z803 Family history of malignant neoplasm of breast: Secondary | ICD-10-CM | POA: Insufficient documentation

## 2020-02-21 NOTE — Progress Notes (Signed)
REFERRING PROVIDER: Chauncey Cruel, MD [address] [place],  [place] [address]  PRIMARY PROVIDER:  [name], MD  PRIMARY REASON FOR VISIT:  1. Malignant neoplasm of lower-outer quadrant of right breast of female, estrogen receptor positive ([place])   2. Family history of prostate cancer   3. Family history of breast cancer   4. Family history of leukemia   5. Family history of lung cancer   6. Family history of stomach cancer      HISTORY OF PRESENT ILLNESS:  Christy Ferrell, a 49 y.o. female, was seen for a [facility] cancer genetics consultation at the request of Dr. Jana Hakim due to a personal and family history of cancer and concerns regarding a hereditary predisposition to cancer. Christy Ferrell recent genetic test results were disclosed to her, as were recommendations warranted by these results. These results and recommendations are discussed in more detail below.  In September of 2021, at the age of 16, Christy Ferrell was diagnosed with invasive mammary carcinoma with ductal carcinoma in situ, ER+/PR+/Her2-, of the right breast. The treatment plan includes MammaPrint on her breast biopsy sample (returned as low risk), Tamoxifen (started 02/13/20), surgery, and radiation therapy.   RISK FACTORS:  Menarche was at age 49.  First live birth at age 81.  OCP use off and on for approximately 13 years.  Ovaries intact: yes.  Hysterectomy: no.  Menopausal status: premenopausal.  HRT use: 0 years. Colonoscopy: no. Mammogram within the last year: yes. Any excessive radiation exposure in the past: no   Past Medical History:  Diagnosis Date   Anxiety    h/o, was prev on zoloft   Breast cancer ([place]) 01/2020   seeing Dr. Marlou Starks   Cholelithiasis    Diabetes mellitus without complication [facility])    Family history of breast cancer    Family history of leukemia    Family history of lung cancer    Family history of prostate cancer    Family history of stomach cancer      Hypothyroidism    postpartum hypothyroidism   IUD (intrauterine device) in place    mirena per [facility]   Major depression     Past Surgical History:  Procedure Laterality Date   CESAREAN SECTION     X 2 Fetal stress, second scheduled   CHOLECYSTECTOMY  05/1998    Social History   Socioeconomic History   Marital status: Divorced    Spouse name: Not on file   Number of children: Not on file   Years of education: Not on file   Highest education level: Not on file  Occupational History   Occupation: Housewife  Tobacco Use   Smoking status: Never Smoker   Smokeless tobacco: Never Used  Scientific laboratory technician Use: Never used  Substance and Sexual Activity   Alcohol use: Yes    Alcohol/week: 0.0 standard drinks    Comment: Very rarely.   Drug use: No   Sexual activity: Yes    Birth control/protection: I.U.D.    Comment: Mirena  Other Topics Concern   Not on file  Social History Narrative   Married 1997, divorced 2017   In relationship as of 2021   2 kids   Social Determinants of Radio broadcast assistant Strain:    Difficulty of Paying Living Expenses: Not on file  Food Insecurity:    Worried About Charity fundraiser in the Last Year: Not on file   [employer]  of Food in the Last Year: Not on file  Transportation Needs:    Lack of Transportation (Medical): Not on file   Lack of Transportation (Non-Medical): Not on file  Physical Activity:    Days of Exercise per Week: Not on file   Minutes of Exercise per Session: Not on file  Stress:    Feeling of Stress : Not on file  Social Connections:    Frequency of Communication with Friends and Family: Not on file   Frequency of Social Gatherings with Friends and Family: Not on file   Attends Religious Services: Not on file   Active Member of Clubs or Organizations: Not on file   Attends Archivist Meetings: Not on file   Marital Status: Not on file     FAMILY HISTORY:   We obtained a detailed, 4-generation family history.  Significant diagnoses are listed below: Family History  Problem Relation Age of Onset   Hypertension Father    Depression Father    Prostate cancer Father 21       Prostate, S/P prostatectomy, metastatic   Asthma Sister    Spina bifida Sister    Hypothyroidism Mother    Hypertension Maternal Grandmother    Hyperlipidemia Maternal Grandmother    Lung disease Maternal Grandmother    Lung cancer Maternal Grandmother 49   Hypothyroidism Maternal Grandfather    Prostate cancer Maternal Grandfather    Cancer Maternal Grandfather        lung, stomach, and prostate cancers   Emphysema Paternal Grandmother    Diabetes Maternal Uncle    Diabetes Maternal Uncle    Leukemia Maternal Uncle 70   Prostate cancer Maternal Uncle        dx in his 60s/70s, metastatic   Breast cancer Other 47       negative genetic testing; maternal first cousin   Stroke Neg Hx    Colon cancer Neg Hx    Christy Ferrell has two sons - Christy Ferrell (age 12) and Christy Ferrell (age 91). She had one sister who died at age 30 due to complications from asthma, and she also had spina bifida.   Christy Ferrell mother is 41 and has not had cancer, although she had a hysterectomy due to precancerous cells. Christy Ferrell had two maternal uncles. One uncle had leukemia and metastatic prostate cancer diagnosed in his 81s or 44s. This uncle had a daughter who was diagnosed with breast cancer at the age of 71 and had negative genetic testing. Christy Ferrell maternal grandmother died at the age of 40 from lung cancer, and her maternal grandfather died at age 60 with lung cancer, stomach cancer, and prostate cancer (unclear which of these were primaries vs metastatic disease).   Christy Ferrell father died at the age of 80 from metastatic prostate cancer that was initially diagnosed in his early 37s. Her father had two full-sisters and one paternal half-brother. Her paternal  grandparents both died at age 9 and did not have cancer. There are no other known diagnoses of cancer on the paternal side of the family.  Christy Ferrell is aware of previous family history of genetic testing for hereditary cancer risks in her maternal first cousin who had breast cancer. Patient's maternal ancestors are of [place] descent, and paternal ancestors are of [place] descent. There is no reported Ashkenazi Jewish ancestry. There is no known consanguinity.  CANCER GENETICS:  In general, most cancer is not inherited in families, but instead is sporadic or familial. Sporadic cancers  occur by chance and typically happen at older ages (>50 years) as this type of cancer is caused by genetic changes acquired during an individuals lifetime. Some families have more cancers than would be expected by chance; however, the ages or types of cancer are not consistent with a known genetic mutation or known genetic mutations have been ruled out. This type of familial cancer is thought to be due to a combination of multiple genetic, environmental, hormonal, and lifestyle factors. While this combination of factors likely increases the risk of cancer, the exact source of this risk is not currently identifiable or testable.    Approximately 5-10% of breast cancer is hereditary, meaning that it is due to a mutation in a single gene that is passed down from generation to generation in a family. Most hereditary cases of breast cancer are associated with the BRCA1 and BRCA2 genes. There are other genes that can be associated an increased risk for breast cancer, including ATM, CHEK2, PALB2, etc. Identifying a hereditary cancer syndrome can be beneficial for several reasons, including knowing about other cancer risks, identifying potential screening and risk-reduction options that may be appropriate, and to understand if other family members could be at risk for cancer and allow them to undergo genetic testing.   GENETIC TEST  RESULTS: Genetic testing reported out on 02/15/2020 through the [facility] Common Hereditary Cancers panel. No pathogenic variants were detected.   The Common Hereditary Cancers Panel offered by [facility] includes sequencing and/or deletion duplication testing of the following 48 genes: APC, ATM, AXIN2, BARD1, BMPR1A, BRCA1, BRCA2, BRIP1, CDH1, CDK4, CDKN2A (p14ARF), CDKN2A (p16INK4a), CHEK2, CTNNA1, DICER1, EPCAM (Deletion/duplication testing only), GREM1 (promoter region deletion/duplication testing only), KIT, MEN1, MLH1, MSH2, MSH3, MSH6, MUTYH, NBN, NF1, NTHL1, PALB2, PDGFRA, PMS2, POLD1, POLE, PTEN, RAD50, RAD51C, RAD51D, RNF43, SDHB, SDHC, SDHD, SMAD4, SMARCA4. STK11, TP53, TSC1, TSC2, and VHL.  The following genes were evaluated for sequence changes only: SDHA and HOXB13 c.251G>A variant only. The test report will be scanned into EPIC and located under the Molecular Pathology section of the Results Review tab.  A portion of the result report is included below for reference.     We discussed with Christy Ferrell that because current genetic testing is not perfect, it is possible there may be a gene mutation in one of these genes that current testing cannot detect, but that chance is small.  We also discussed that there could be another gene that has not yet been discovered, or that we have not yet tested, that is responsible for the cancer diagnoses in the family. It is also possible there is a hereditary cause for the cancer in the family that Christy Ferrell did not inherit and therefore was not identified in her testing.  Therefore, it is important to remain in touch with cancer genetics in the future so that we can continue to offer Christy Ferrell the most up to date genetic testing.   ADDITIONAL GENETIC TESTING: We discussed with Christy Ferrell that her genetic testing was fairly extensive.  If there are genes identified to increase cancer risk that can be analyzed in the future, we would be happy to discuss and  coordinate this testing at that time.    CANCER SCREENING RECOMMENDATIONS: Christy Ferrell test result is considered negative (normal).  This means that we have not identified a hereditary cause for her personal and family history of cancer at this time. While reassuring, this does not definitively rule out a hereditary predisposition to cancer. It  is still possible that there could be genetic mutations that are undetectable by current technology. There could be genetic mutations in genes that have not been tested or identified to increase cancer risk.  Therefore, it is recommended she continue to follow the cancer management and screening guidelines provided by her oncology and primary healthcare provider.   An individual's cancer risk and medical management are not determined by genetic test results alone. Overall cancer risk assessment incorporates additional factors, including personal medical history, family history, and any available genetic information that may result in a personalized plan for cancer prevention and surveillance.  RECOMMENDATIONS FOR FAMILY MEMBERS:  Individuals in this family might be at some increased risk of developing cancer, over the general population risk, simply due to the family history of cancer.  We recommended women in this family have a yearly mammogram beginning at age 70, or 47 years younger than the earliest onset of cancer, an annual clinical breast exam, and perform monthly breast self-exams. Women in this family should also have a gynecological exam as recommended by their primary provider. All family members should be referred for colonoscopy starting at age 62.  FOLLOW-UP: Lastly, we discussed with Christy Ferrell that cancer genetics is a rapidly advancing field and it is possible that new genetic tests will be appropriate for her and/or her family members in the future. We encouraged her to remain in contact with cancer genetics on an annual basis so we can update her  personal and family histories and let her know of advances in cancer genetics that may benefit this family.   Our contact number was provided. Christy Ferrell questions were answered to her satisfaction, and she knows she is welcome to call us at anytime with additional questions or concerns.   Christy Guy, MS, [facility] Genetic Counselor [email or web address] Phone: [phone]

## 2020-02-24 ENCOUNTER — Encounter: Payer: Self-pay | Admitting: Plastic Surgery

## 2020-03-03 ENCOUNTER — Telehealth (INDEPENDENT_AMBULATORY_CARE_PROVIDER_SITE_OTHER): Payer: No Typology Code available for payment source | Admitting: Plastic Surgery

## 2020-03-03 ENCOUNTER — Encounter: Payer: Self-pay | Admitting: Plastic Surgery

## 2020-03-03 ENCOUNTER — Other Ambulatory Visit: Payer: Self-pay

## 2020-03-03 ENCOUNTER — Encounter: Payer: Self-pay | Admitting: *Deleted

## 2020-03-03 DIAGNOSIS — C50511 Malignant neoplasm of lower-outer quadrant of right female breast: Secondary | ICD-10-CM | POA: Diagnosis not present

## 2020-03-03 DIAGNOSIS — Z170 Estrogen receptor positive status [ER+]: Secondary | ICD-10-CM

## 2020-03-03 NOTE — Progress Notes (Signed)
The patient is a 49 year old female joining me by phone for a telemetry visit.  She is at home and I am at the office.  We spoke for a about 10 minutes.  She was diagnosed with right-sided breast cancer when she felt a mass in her right axilla.  It was confirmed with mammogram and ultrasound and then biopsy.  The biopsy showed invasive breast cancer and ductal carcinoma in situ.  A lymph node was also positive.  The pathology showed an estrogen and progesterone positive tumor and HER-2 negative.  She is not a smoker.  She does have diabetes.  She is 5 feet 3 inches tall and weighs 173 pounds.  Her preoperative bra size is a 38 D/DD.   She has been considering her options since our visit.  She also found out that the genetics was negative and the left breast biopsy was not done because no mass was seen.  She would like to proceed with a right-sided mastectomy.  She understands that a implant-based reconstruction is started with an expander placed at the time of the mastectomy.  The implant would then be placed approximately 2 to 3 months later.  If she wants symmetry with the other breast this could be done at the same time as the implant exchange.   I will speak with Dr. Marlou Starks and be sure he is aware of her current plan. Plan will be right-sided mastectomy with immediate reconstruction.  The left side will be reduced for symmetry at the 2 to 15-month timeframe.

## 2020-03-12 ENCOUNTER — Encounter: Payer: Self-pay | Admitting: Family Medicine

## 2020-03-12 ENCOUNTER — Other Ambulatory Visit: Payer: Self-pay

## 2020-03-12 ENCOUNTER — Ambulatory Visit (INDEPENDENT_AMBULATORY_CARE_PROVIDER_SITE_OTHER): Payer: No Typology Code available for payment source | Admitting: Family Medicine

## 2020-03-12 VITALS — BP 106/68 | HR 75 | Temp 96.9°F | Wt 175.6 lb

## 2020-03-12 DIAGNOSIS — E119 Type 2 diabetes mellitus without complications: Secondary | ICD-10-CM | POA: Diagnosis not present

## 2020-03-12 DIAGNOSIS — F419 Anxiety disorder, unspecified: Secondary | ICD-10-CM

## 2020-03-12 DIAGNOSIS — E039 Hypothyroidism, unspecified: Secondary | ICD-10-CM

## 2020-03-12 LAB — POCT GLYCOSYLATED HEMOGLOBIN (HGB A1C): Hemoglobin A1C: 6.6 % — AB (ref 4.0–5.6)

## 2020-03-12 MED ORDER — ESCITALOPRAM OXALATE 10 MG PO TABS
10.0000 mg | ORAL_TABLET | Freq: Two times a day (BID) | ORAL | 3 refills | Status: DC
Start: 2020-03-12 — End: 2020-09-11

## 2020-03-12 NOTE — Progress Notes (Signed)
This visit occurred during the SARS-CoV-2 public health emergency.  Safety protocols were in place, including screening questions prior to the visit, additional usage of staff PPE, and extensive cleaning of exam room while observing appropriate contact time as indicated for disinfecting solutions.  Diabetes:  No meds.   Hypoglycemic episodes: no Hyperglycemic episodes: no Feet problems: no Blood Sugars averaging: not checked.   eye exam within last year: d/w pt.   A1c d/w pt at OV, 7--->6.6.  She is working on diet.  D/w pt.    Hypothyroidism.  Started on levothyroxine.  Routine use d/w pt.  No neck mass.  No ADE on med.  F/u TSH pending.    Breast cancer dx d/w pt.  Inc anxiety d/w pt.  She had increased lexapro to 20mg  about 2 weeks ago with some relief.    Flu and covid vaccine encouraged.  Discussed with patient.  Meds, vitals, and allergies reviewed.  ROS: Per HPI unless specifically indicated in ROS section   GEN: nad, alert and oriented HEENT: ncat NECK: supple w/o LA CV: rrr. PULM: ctab, no inc wob ABD: soft, +bs EXT: no edema SKIN: no acute rash  Diabetic foot exam: Normal inspection No skin breakdown No calluses  Normal DP pulses Normal sensation to light touch and monofilament Nails normal

## 2020-03-12 NOTE — Patient Instructions (Addendum)
Stay on 20mg  lexapro.   Your A1c was better.  Thanks for your effort.   Go by the lab on the way out for the thyroid test.   Take care.  Glad to see you. I want your surgery to go well.    Plan on recheck A1c at a visit in about 3 months, like today.

## 2020-03-13 LAB — TSH: TSH: 4.16 u[IU]/mL (ref 0.35–4.50)

## 2020-03-15 NOTE — Assessment & Plan Note (Signed)
Breast cancer dx d/w pt.  Inc anxiety d/w pt.  She had increased lexapro to 20mg  about 2 weeks ago with some relief.   She has appropriate breast cancer follow-up pending.  Reasonable to continue Lexapro as is at 20 mg a day.  She will update me as needed.

## 2020-03-15 NOTE — Assessment & Plan Note (Signed)
Started on levothyroxine.  Routine use d/w pt.  No neck mass.  No ADE on med.  F/u TSH pending.   See notes on TSH.

## 2020-03-15 NOTE — Assessment & Plan Note (Signed)
A1c d/w pt at OV, 7--->6.6.  She is working on diet.  D/w pt.   A1c improved.  Continue work on diet and exercise.  Recheck periodically.

## 2020-03-16 ENCOUNTER — Encounter: Payer: Self-pay | Admitting: Surgical

## 2020-03-16 NOTE — Progress Notes (Signed)
Patient ID: Christy Ferrell, female    DOB: 1971/04/19, 49 y.o.   MRN: [id number]  Chief Complaint  Patient presents with  . Pre-op Exam      ICD-10-CM   1. Malignant neoplasm of lower-outer quadrant of right breast of female, estrogen receptor positive ([place])  C50.511    Z17.0     History of Present Illness: Christy Ferrell is a 49 y.o.  female  with a history of right sided breast cancer.  She presents for preoperative evaluation for upcoming procedure, right modified radical mastectomy with Dr. Marlou Starks followed by immediate right breast reconstruction with placement of tissue expander and flex hd, scheduled for 03/30/20 with Dr. Marla Roe  The patient has not had problems with anesthesia. No history of DVT/PE.  Reports her grandmother had a DVT in the past, no other family members with history of DVT.  No family or personal history of bleeding or clotting disorders.  Patient is not currently taking any blood thinners.  No history of CVA/MI.   Has Mirena IUD  Summary of Previous Visit: Patient with diagnosis of right sided breast cancer, felt mass in her axilla and was found to have additional mass in lower inner quadrant of right breast with 5 mm nodule and 1.6 mass. bx of showed 2 invasive breast cancer and DCIS, lymph node bx + and showed ER/PR +, HER2 negative.   PMH Significant for: DM2 - most recent A1c 6.6 (03/12/20)   Past Medical History: Allergies: Allergies  Allergen Reactions  . Codeine Nausea And Vomiting  . Zoloft [Sertraline Hcl]     headaches    Current Medications:  Current Outpatient Medications:  .  acyclovir ointment (ZOVIRAX) 5 %, Apply 1 application topically every 3 (three) hours. As needed., Disp: 5 g, Rfl: 5 .  cyclobenzaprine (FLEXERIL) 10 MG tablet, TAKE 1/2 TO 1 TABLET BY MOUTH EVERY DAY AS NEEDED, Disp: 30 tablet, Rfl: 5 .  escitalopram (LEXAPRO) 10 MG tablet, Take 1 tablet (10 mg total) by mouth in the morning and at bedtime., Disp: 180 tablet,  Rfl: 3 .  ibuprofen (ADVIL,MOTRIN) 200 MG tablet, Take 200 mg by mouth every 6 (six) hours as needed., Disp: , Rfl:  .  levonorgestrel (MIRENA) 20 MCG/24HR IUD, 1 each by Intrauterine route once. 11/05/12, Disp: , Rfl:  .  levothyroxine (SYNTHROID) 25 MCG tablet, Take 1 tablet (25 mcg total) by mouth daily before breakfast., Disp: 90 tablet, Rfl: 3 .  valACYclovir (VALTREX) 1000 MG tablet, Take 2 tablets (2,000 mg total) by mouth 2 (two) times daily as needed. For 1 day., Disp: 20 tablet, Rfl: 5  Past Medical Problems: Past Medical History:  Diagnosis Date  . Anxiety    h/o, was prev on zoloft  . Breast cancer ([place]) 01/2020   seeing Dr. Marlou Starks  . Cholelithiasis   . Diabetes mellitus without complication ([place])   . Family history of breast cancer   . Family history of leukemia   . Family history of lung cancer   . Family history of prostate cancer   . Family history of stomach cancer   . Hypothyroidism    postpartum hypothyroidism  . IUD (intrauterine device) in place    mirena per [employer]  . Major depression     Past Surgical History: Past Surgical History:  Procedure Laterality Date  . CESAREAN SECTION     X 2 Fetal stress, second scheduled  . CHOLECYSTECTOMY  05/1998    Social History:  Social History   Socioeconomic History  . Marital status: Divorced    Spouse name: Not on file  . Number of children: Not on file  . Years of education: Not on file  . Highest education level: Not on file  Occupational History  . Occupation: Housewife  Tobacco Use  . Smoking status: Never Smoker  . Smokeless tobacco: Never Used  Vaping Use  . Vaping Use: Never used  Substance and Sexual Activity  . Alcohol use: Yes    Alcohol/week: 0.0 standard drinks    Comment: Very rarely.  . Drug use: No  . Sexual activity: Yes    Birth control/protection: I.U.D.    Comment: Mirena  Other Topics Concern  . Not on file  Social History Narrative   Married 1997, divorced 2017   In  relationship as of 2021   2 kids   Social Determinants of Health   Financial Resource Strain:   . Difficulty of Paying Living Expenses: Not on file  Food Insecurity:   . Worried About Charity fundraiser in the Last Year: Not on file  . Ran Out of Food in the Last Year: Not on file  Transportation Needs:   . Lack of Transportation (Medical): Not on file  . Lack of Transportation (Non-Medical): Not on file  Physical Activity:   . Days of Exercise per Week: Not on file  . Minutes of Exercise per Session: Not on file  Stress:   . Feeling of Stress : Not on file  Social Connections:   . Frequency of Communication with Friends and Family: Not on file  . Frequency of Social Gatherings with Friends and Family: Not on file  . Attends Religious Services: Not on file  . Active Member of Clubs or Organizations: Not on file  . Attends Archivist Meetings: Not on file  . Marital Status: Not on file  Intimate Partner Violence:   . Fear of Current or Ex-Partner: Not on file  . Emotionally Abused: Not on file  . Physically Abused: Not on file  . Sexually Abused: Not on file    Family History: Family History  Problem Relation Age of Onset  . Hypertension Father   . Depression Father   . Prostate cancer Father 75       Prostate, S/P prostatectomy, metastatic  . Asthma Sister   . Spina bifida Sister   . Hypothyroidism Mother   . Hypertension Maternal Grandmother   . Hyperlipidemia Maternal Grandmother   . Lung disease Maternal Grandmother   . Lung cancer Maternal Grandmother 74  . Hypothyroidism Maternal Grandfather   . Prostate cancer Maternal Grandfather   . Cancer Maternal Grandfather        lung, stomach, and prostate cancers  . Emphysema Paternal Grandmother   . Diabetes Maternal Uncle   . Diabetes Maternal Uncle   . Leukemia Maternal Uncle 43  . Prostate cancer Maternal Uncle        dx in his 19s/70s, metastatic  . Breast cancer Other 45       negative genetic  testing; maternal first cousin  . Stroke Neg Hx   . Colon cancer Neg Hx     Review of Systems: Review of Systems  Constitutional: Negative.   Respiratory: Negative.   Cardiovascular: Negative.   Gastrointestinal: Negative.   Neurological: Negative.     Physical Exam: Vital Signs BP 130/84 (BP Location: Left Arm, Patient Position: Sitting, Cuff Size: Normal)   Pulse 81  Temp 97.9 F (36.6 C) (Oral)   Ht 5\' 3"  (1.6 m)   Wt 176 lb 6.4 oz (80 kg)   SpO2 95%   BMI 31.25 kg/m  Physical Exam Exam conducted with a chaperone present.  Constitutional:      General: She is not in acute distress.    Appearance: Normal appearance. She is not ill-appearing.  HENT:     Head: Normocephalic and atraumatic.  Eyes:     Pupils: Pupils are equal, round Neck:     Musculoskeletal: Normal range of motion.  Cardiovascular:     Rate and Rhythm: Normal rate and regular rhythm.     Pulses: Normal pulses.     Heart sounds: Normal heart sounds. No murmur.  Pulmonary:     Effort: Pulmonary effort is normal. No respiratory distress.     Breath sounds: Normal breath sounds. No wheezing.  Abdominal:     General: Abdomen is flat. There is no distension.     Palpations: Abdomen is soft.     Tenderness: There is no abdominal tenderness.  Musculoskeletal: Normal range of motion.  Skin:    General: Skin is warm and dry.     Findings: No erythema or rash.  Neurological:     General: No focal deficit present.     Mental Status: She is alert and oriented to person, place, and time. Mental status is at baseline.     Motor: No weakness.  Psychiatric:        Mood and Affect: Mood normal.        Behavior: Behavior normal.    Assessment/Plan: The patient is scheduled for immediate right breast reconstruction with placement of tissue expander and Flex HD on 03/30/2020 with Dr. Marla Roe after mastectomy by general surgery.  Risks, benefits, and alternatives of procedure discussed, questions answered  and consent obtained.    Smoking Status: Non-smoker; Counseling Given?  N/A Last Mammogram: 01/15/20; Results: Mass noted in right breast (2 areas) and 2 enlarged lymph nodes, patient had further work up and is scheduled for mastectomy.  Caprini Score: 7, high; Risk Factors include: age, BMI > 25, Mirena IUD, breast cancer and length of planned surgery. Recommendation for mechanical and pharmacological prophylaxis. Encourage early ambulation.   Pictures obtained:[email or web address]   Post-op Rx sent to pharmacy:  tramadol, Keflex, Zofran, Valium  Patient was provided with the Breast Reconstruction and General Surgical Risk consent document and Pain Medication Agreement prior to their appointment.  They had adequate time to read through the risk consent documents and Pain Medication Agreement. We also discussed them in person together during this preop appointment. All of their questions were answered to their satisfaction.  Recommended calling if they have any further questions.  Risk consent form and Pain Medication Agreement to be scanned into patient's chart.  The risks that can be encountered with and after placement of a breast expander placement were discussed and include the following but not limited to these: bleeding, infection, delayed healing, anesthesia risks, skin sensation changes, injury to structures including nerves, blood vessels, and muscles which may be temporary or permanent, allergies to tape, suture materials and glues, blood products, topical preparations or injected agents, skin contour irregularities, skin discoloration and swelling, deep vein thrombosis, cardiac and pulmonary complications, pain, which may persist, fluid accumulation, wrinkling of the skin over the expander, changes in nipple or breast sensation, expander leakage or rupture, faulty position of the expander, persistent pain, formation of tight scar tissue around the expander (capsular contracture), possible  need for  revisional surgery or staged procedures.    Electronically signed by: Carola Rhine Kline Bulthuis, PA-C 03/17/2020 9:31 AM

## 2020-03-17 ENCOUNTER — Encounter: Payer: Self-pay | Admitting: Surgical

## 2020-03-17 ENCOUNTER — Ambulatory Visit (INDEPENDENT_AMBULATORY_CARE_PROVIDER_SITE_OTHER): Payer: No Typology Code available for payment source | Admitting: Surgical

## 2020-03-17 ENCOUNTER — Other Ambulatory Visit: Payer: Self-pay

## 2020-03-17 VITALS — BP 130/84 | HR 81 | Temp 97.9°F | Ht 63.0 in | Wt 176.4 lb

## 2020-03-17 DIAGNOSIS — C50511 Malignant neoplasm of lower-outer quadrant of right female breast: Secondary | ICD-10-CM

## 2020-03-17 DIAGNOSIS — Z170 Estrogen receptor positive status [ER+]: Secondary | ICD-10-CM

## 2020-03-17 MED ORDER — TRAMADOL HCL 50 MG PO TABS
50.0000 mg | ORAL_TABLET | Freq: Three times a day (TID) | ORAL | 0 refills | Status: AC | PRN
Start: 2020-03-17 — End: 2020-03-22

## 2020-03-17 MED ORDER — ONDANSETRON HCL 4 MG PO TABS: 4.0000 mg | ORAL_TABLET | Freq: Three times a day (TID) | ORAL | 0 refills | Status: DC | PRN

## 2020-03-17 MED ORDER — CEPHALEXIN 500 MG PO CAPS: 500.0000 mg | ORAL_CAPSULE | Freq: Four times a day (QID) | ORAL | 0 refills | Status: AC

## 2020-03-17 MED ORDER — DIAZEPAM 2 MG PO TABS: 2.0000 mg | ORAL_TABLET | Freq: Two times a day (BID) | ORAL | 0 refills | Status: DC | PRN

## 2020-03-23 ENCOUNTER — Other Ambulatory Visit: Payer: Self-pay

## 2020-03-23 ENCOUNTER — Encounter (HOSPITAL_BASED_OUTPATIENT_CLINIC_OR_DEPARTMENT_OTHER): Payer: Self-pay | Admitting: General Surgery

## 2020-03-23 NOTE — Progress Notes (Signed)
No pre op orders for dr Marla Roe and inbasket message sent

## 2020-03-26 ENCOUNTER — Other Ambulatory Visit (HOSPITAL_COMMUNITY)
Admission: RE | Admit: 2020-03-26 | Discharge: 2020-03-26 | Disposition: A | Discharge status: Home / Self Care | Payer: No Typology Code available for payment source | Source: Ambulatory Visit | Attending: General Surgery | Admitting: General Surgery

## 2020-03-26 DIAGNOSIS — Z20822 Contact with and (suspected) exposure to covid-19: Secondary | ICD-10-CM | POA: Insufficient documentation

## 2020-03-26 DIAGNOSIS — Z01812 Encounter for preprocedural laboratory examination: Secondary | ICD-10-CM | POA: Insufficient documentation

## 2020-03-26 LAB — SARS CORONAVIRUS 2 (TAT 6-24 HRS): SARS Coronavirus 2: NEGATIVE

## 2020-03-30 ENCOUNTER — Encounter (HOSPITAL_BASED_OUTPATIENT_CLINIC_OR_DEPARTMENT_OTHER)
Admission: RE | Disposition: A | Discharge status: Home / Self Care | Payer: Self-pay | Source: Ambulatory Visit | Attending: General Surgery

## 2020-03-30 ENCOUNTER — Ambulatory Visit (HOSPITAL_BASED_OUTPATIENT_CLINIC_OR_DEPARTMENT_OTHER): Payer: No Typology Code available for payment source | Admitting: Anesthesiology

## 2020-03-30 ENCOUNTER — Encounter (HOSPITAL_BASED_OUTPATIENT_CLINIC_OR_DEPARTMENT_OTHER): Payer: Self-pay | Admitting: General Surgery

## 2020-03-30 ENCOUNTER — Observation Stay (HOSPITAL_BASED_OUTPATIENT_CLINIC_OR_DEPARTMENT_OTHER)
Admission: RE | Admit: 2020-03-30 | Discharge: 2020-03-30 | Disposition: A | Discharge status: Home / Self Care | Payer: No Typology Code available for payment source | Source: Ambulatory Visit | Attending: Plastic Surgery | Admitting: Plastic Surgery

## 2020-03-30 ENCOUNTER — Other Ambulatory Visit: Payer: Self-pay

## 2020-03-30 DIAGNOSIS — C50511 Malignant neoplasm of lower-outer quadrant of right female breast: Secondary | ICD-10-CM

## 2020-03-30 DIAGNOSIS — C50919 Malignant neoplasm of unspecified site of unspecified female breast: Secondary | ICD-10-CM | POA: Diagnosis present

## 2020-03-30 DIAGNOSIS — Z170 Estrogen receptor positive status [ER+]: Secondary | ICD-10-CM

## 2020-03-30 DIAGNOSIS — C50311 Malignant neoplasm of lower-inner quadrant of right female breast: Secondary | ICD-10-CM | POA: Diagnosis present

## 2020-03-30 HISTORY — PX: BREAST RECONSTRUCTION WITH PLACEMENT OF TISSUE EXPANDER AND FLEX HD (ACELLULAR HYDRATED DERMIS): SHX6295

## 2020-03-30 HISTORY — PX: MASTECTOMY MODIFIED RADICAL: SHX5962

## 2020-03-30 LAB — POCT PREGNANCY, URINE: Preg Test, Ur: NEGATIVE

## 2020-03-30 SURGERY — MASTECTOMY, MODIFIED RADICAL
Anesthesia: Regional | Site: Breast | Laterality: Right

## 2020-03-30 MED ORDER — GABAPENTIN 300 MG PO CAPS
ORAL_CAPSULE | ORAL | Status: AC
  Filled 2020-03-30: qty 1

## 2020-03-30 MED ORDER — LIDOCAINE HCL (CARDIAC) PF 100 MG/5ML IV SOSY
PREFILLED_SYRINGE | INTRAVENOUS | Status: DC | PRN
  Administered 2020-03-30: 60 mg via INTRAVENOUS

## 2020-03-30 MED ORDER — PHENYLEPHRINE HCL (PRESSORS) 10 MG/ML IV SOLN
INTRAVENOUS | Status: DC | PRN
  Administered 2020-03-30 (×3): 80 ug via INTRAVENOUS

## 2020-03-30 MED ORDER — SODIUM CHLORIDE 0.9 % IV SOLN
INTRAVENOUS | Status: DC | PRN
  Administered 2020-03-30: 500 mL

## 2020-03-30 MED ORDER — ONDANSETRON HCL 4 MG/2ML IJ SOLN: 4.0000 mg | Freq: Four times a day (QID) | INTRAMUSCULAR | Status: DC | PRN

## 2020-03-30 MED ORDER — FENTANYL CITRATE (PF) 100 MCG/2ML IJ SOLN
INTRAMUSCULAR | Status: AC
  Filled 2020-03-30: qty 2

## 2020-03-30 MED ORDER — ONDANSETRON HCL 4 MG/2ML IJ SOLN
INTRAMUSCULAR | Status: DC | PRN
  Administered 2020-03-30: 4 mg via INTRAVENOUS

## 2020-03-30 MED ORDER — POLYETHYLENE GLYCOL 3350 17 G PO PACK: 17.0000 g | PACK | Freq: Every day | ORAL | Status: DC | PRN

## 2020-03-30 MED ORDER — GABAPENTIN 300 MG PO CAPS
300.0000 mg | ORAL_CAPSULE | ORAL | Status: AC
  Administered 2020-03-30: 300 mg via ORAL

## 2020-03-30 MED ORDER — MORPHINE SULFATE (PF) 4 MG/ML IV SOLN: 2.0000 mg | INTRAVENOUS | Status: DC | PRN

## 2020-03-30 MED ORDER — CEFAZOLIN SODIUM-DEXTROSE 2-4 GM/100ML-% IV SOLN
INTRAVENOUS | Status: AC
  Filled 2020-03-30: qty 100

## 2020-03-30 MED ORDER — PROPOFOL 10 MG/ML IV BOLUS
INTRAVENOUS | Status: DC | PRN
  Administered 2020-03-30: 180 mg via INTRAVENOUS

## 2020-03-30 MED ORDER — SODIUM CHLORIDE (PF) 0.9 % IJ SOLN
INTRAMUSCULAR | Status: AC
  Filled 2020-03-30: qty 10

## 2020-03-30 MED ORDER — CHLORHEXIDINE GLUCONATE CLOTH 2 % EX PADS: 6.0000 | MEDICATED_PAD | Freq: Once | CUTANEOUS | Status: DC

## 2020-03-30 MED ORDER — DIPHENHYDRAMINE HCL 50 MG/ML IJ SOLN: 12.5000 mg | Freq: Four times a day (QID) | INTRAMUSCULAR | Status: DC | PRN

## 2020-03-30 MED ORDER — EPHEDRINE SULFATE 50 MG/ML IJ SOLN
INTRAMUSCULAR | Status: DC | PRN
  Administered 2020-03-30 (×2): 10 mg via INTRAVENOUS

## 2020-03-30 MED ORDER — DEXAMETHASONE SODIUM PHOSPHATE 4 MG/ML IJ SOLN
INTRAMUSCULAR | Status: DC | PRN
  Administered 2020-03-30: 10 mg via INTRAVENOUS

## 2020-03-30 MED ORDER — IBUPROFEN 200 MG PO TABS: 400.0000 mg | ORAL_TABLET | Freq: Four times a day (QID) | ORAL | Status: DC

## 2020-03-30 MED ORDER — ACETAMINOPHEN 325 MG PO TABS: 325.0000 mg | ORAL_TABLET | Freq: Four times a day (QID) | ORAL | Status: DC

## 2020-03-30 MED ORDER — FENTANYL CITRATE (PF) 100 MCG/2ML IJ SOLN
INTRAMUSCULAR | Status: DC | PRN
  Administered 2020-03-30 (×4): 25 ug via INTRAVENOUS

## 2020-03-30 MED ORDER — ONDANSETRON HCL 4 MG/2ML IJ SOLN: INTRAMUSCULAR | Status: DC | PRN

## 2020-03-30 MED ORDER — CEFAZOLIN SODIUM-DEXTROSE 2-4 GM/100ML-% IV SOLN: 2.0000 g | INTRAVENOUS | Status: DC

## 2020-03-30 MED ORDER — PROMETHAZINE HCL 25 MG/ML IJ SOLN
6.2500 mg | Freq: Once | INTRAMUSCULAR | Status: AC
  Administered 2020-03-30: 6.25 mg via INTRAVENOUS

## 2020-03-30 MED ORDER — MIDAZOLAM HCL 2 MG/2ML IJ SOLN
INTRAMUSCULAR | Status: AC
  Filled 2020-03-30: qty 2

## 2020-03-30 MED ORDER — ACETAMINOPHEN 500 MG PO TABS
ORAL_TABLET | ORAL | Status: AC
  Filled 2020-03-30: qty 2

## 2020-03-30 MED ORDER — FENTANYL CITRATE (PF) 100 MCG/2ML IJ SOLN: 25.0000 ug | INTRAMUSCULAR | Status: DC | PRN

## 2020-03-30 MED ORDER — ACETAMINOPHEN 160 MG/5ML PO SOLN: 1000.0000 mg | Freq: Once | ORAL | Status: DC | PRN

## 2020-03-30 MED ORDER — LIDOCAINE 2% (20 MG/ML) 5 ML SYRINGE
INTRAMUSCULAR | Status: AC
  Filled 2020-03-30: qty 5

## 2020-03-30 MED ORDER — KCL IN DEXTROSE-NACL 20-5-0.45 MEQ/L-%-% IV SOLN: INTRAVENOUS | Status: DC

## 2020-03-30 MED ORDER — MIDAZOLAM HCL 2 MG/2ML IJ SOLN
2.0000 mg | Freq: Once | INTRAMUSCULAR | Status: AC
  Administered 2020-03-30: 2 mg via INTRAVENOUS

## 2020-03-30 MED ORDER — CELECOXIB 200 MG PO CAPS
200.0000 mg | ORAL_CAPSULE | ORAL | Status: AC
  Administered 2020-03-30: 200 mg via ORAL

## 2020-03-30 MED ORDER — DIAZEPAM 2 MG PO TABS: 2.0000 mg | ORAL_TABLET | Freq: Two times a day (BID) | ORAL | Status: DC | PRN

## 2020-03-30 MED ORDER — CEFAZOLIN SODIUM-DEXTROSE 1-4 GM/50ML-% IV SOLN: 1.0000 g | Freq: Three times a day (TID) | INTRAVENOUS | Status: DC

## 2020-03-30 MED ORDER — KCL IN DEXTROSE-NACL 10-5-0.45 MEQ/L-%-% IV SOLN: INTRAVENOUS | Status: DC

## 2020-03-30 MED ORDER — LACTATED RINGERS IV SOLN: INTRAVENOUS | Status: DC

## 2020-03-30 MED ORDER — HYDROCODONE-ACETAMINOPHEN 5-325 MG PO TABS: 1.0000 | ORAL_TABLET | ORAL | Status: DC | PRN

## 2020-03-30 MED ORDER — SODIUM CHLORIDE 0.9% FLUSH: 3.0000 mL | INTRAVENOUS | Status: DC | PRN

## 2020-03-30 MED ORDER — SENNA 8.6 MG PO TABS: 1.0000 | ORAL_TABLET | Freq: Two times a day (BID) | ORAL | Status: DC

## 2020-03-30 MED ORDER — ACETAMINOPHEN 325 MG RE SUPP: 650.0000 mg | RECTAL | Status: DC | PRN

## 2020-03-30 MED ORDER — ACETAMINOPHEN 10 MG/ML IV SOLN: 1000.0000 mg | Freq: Once | INTRAVENOUS | Status: DC | PRN

## 2020-03-30 MED ORDER — PROPOFOL 10 MG/ML IV BOLUS
INTRAVENOUS | Status: AC
  Filled 2020-03-30: qty 20

## 2020-03-30 MED ORDER — OXYCODONE HCL 5 MG PO TABS: 5.0000 mg | ORAL_TABLET | Freq: Once | ORAL | Status: DC | PRN

## 2020-03-30 MED ORDER — BUPIVACAINE-EPINEPHRINE (PF) 0.5% -1:200000 IJ SOLN
INTRAMUSCULAR | Status: DC | PRN
  Administered 2020-03-30: 30 mL via PERINEURAL

## 2020-03-30 MED ORDER — SODIUM CHLORIDE 0.9% FLUSH: 3.0000 mL | Freq: Two times a day (BID) | INTRAVENOUS | Status: DC

## 2020-03-30 MED ORDER — CEFAZOLIN SODIUM-DEXTROSE 2-4 GM/100ML-% IV SOLN
2.0000 g | INTRAVENOUS | Status: AC
  Administered 2020-03-30: 2 g via INTRAVENOUS

## 2020-03-30 MED ORDER — ONDANSETRON 4 MG PO TBDP: 4.0000 mg | ORAL_TABLET | Freq: Four times a day (QID) | ORAL | Status: DC | PRN

## 2020-03-30 MED ORDER — ONDANSETRON HCL 4 MG/2ML IJ SOLN
INTRAMUSCULAR | Status: AC
  Filled 2020-03-30: qty 2

## 2020-03-30 MED ORDER — CELECOXIB 200 MG PO CAPS
ORAL_CAPSULE | ORAL | Status: AC
  Filled 2020-03-30: qty 1

## 2020-03-30 MED ORDER — SIMETHICONE 80 MG PO CHEW: 40.0000 mg | CHEWABLE_TABLET | Freq: Four times a day (QID) | ORAL | Status: DC | PRN

## 2020-03-30 MED ORDER — FENTANYL CITRATE (PF) 100 MCG/2ML IJ SOLN
50.0000 ug | Freq: Once | INTRAMUSCULAR | Status: AC
  Administered 2020-03-30: 50 ug via INTRAVENOUS

## 2020-03-30 MED ORDER — DEXAMETHASONE SODIUM PHOSPHATE 10 MG/ML IJ SOLN
INTRAMUSCULAR | Status: AC
  Filled 2020-03-30: qty 1

## 2020-03-30 MED ORDER — ACETAMINOPHEN 325 MG PO TABS: 650.0000 mg | ORAL_TABLET | ORAL | Status: DC | PRN

## 2020-03-30 MED ORDER — ACETAMINOPHEN 500 MG PO TABS
1000.0000 mg | ORAL_TABLET | ORAL | Status: AC
  Administered 2020-03-30: 1000 mg via ORAL

## 2020-03-30 MED ORDER — SODIUM CHLORIDE 0.9 % IV SOLN: 250.0000 mL | INTRAVENOUS | Status: DC | PRN

## 2020-03-30 MED ORDER — PROMETHAZINE HCL 25 MG/ML IJ SOLN
INTRAMUSCULAR | Status: AC
  Filled 2020-03-30: qty 1

## 2020-03-30 MED ORDER — ACETAMINOPHEN 500 MG PO TABS: 1000.0000 mg | ORAL_TABLET | Freq: Once | ORAL | Status: DC | PRN

## 2020-03-30 MED ORDER — FENTANYL CITRATE (PF) 100 MCG/2ML IJ SOLN
25.0000 ug | INTRAMUSCULAR | Status: DC | PRN
  Administered 2020-03-30: 50 ug via INTRAVENOUS

## 2020-03-30 MED ORDER — DIPHENHYDRAMINE HCL 12.5 MG/5ML PO ELIX: 12.5000 mg | ORAL_SOLUTION | Freq: Four times a day (QID) | ORAL | Status: DC | PRN

## 2020-03-30 MED ORDER — OXYCODONE HCL 5 MG/5ML PO SOLN: 5.0000 mg | Freq: Once | ORAL | Status: DC | PRN

## 2020-03-30 SURGICAL SUPPLY — 72 items
ADH SKN CLS APL DERMABOND .7 (GAUZE/BANDAGES/DRESSINGS) ×1
APL PRP STRL LF DISP 70% ISPRP (MISCELLANEOUS) ×1
APPLIER CLIP 9.375 MED OPEN (MISCELLANEOUS) ×2
APR CLP MED 9.3 20 MLT OPN (MISCELLANEOUS) ×1
BAG DECANTER FOR FLEXI CONT (MISCELLANEOUS) ×2 IMPLANT
BINDER BREAST LRG (GAUZE/BANDAGES/DRESSINGS) IMPLANT
BINDER BREAST XLRG (GAUZE/BANDAGES/DRESSINGS) IMPLANT
BINDER BREAST XXLRG (GAUZE/BANDAGES/DRESSINGS) IMPLANT
BIOPATCH RED 1 DISK 7.0 (GAUZE/BANDAGES/DRESSINGS) ×2 IMPLANT
BLADE HEX COATED 2.75 (ELECTRODE) ×2 IMPLANT
BLADE SURG 10 STRL SS (BLADE) ×2 IMPLANT
BLADE SURG 15 STRL LF DISP TIS (BLADE) ×1 IMPLANT
BLADE SURG 15 STRL SS (BLADE) ×2
BNDG GAUZE ELAST 4 BULKY (GAUZE/BANDAGES/DRESSINGS) ×4 IMPLANT
CANISTER SUCT 1200ML W/VALVE (MISCELLANEOUS) ×2 IMPLANT
CHLORAPREP W/TINT 26 (MISCELLANEOUS) ×2 IMPLANT
CLIP APPLIE 9.375 MED OPEN (MISCELLANEOUS) ×1 IMPLANT
COVER BACK TABLE 60X90IN (DRAPES) ×2 IMPLANT
COVER MAYO STAND STRL (DRAPES) ×2 IMPLANT
DERMABOND ADVANCED (GAUZE/BANDAGES/DRESSINGS) ×1
DERMABOND ADVANCED .7 DNX12 (GAUZE/BANDAGES/DRESSINGS) ×1 IMPLANT
DEVICE DISSECT PLASMABLAD 3.0S (MISCELLANEOUS) ×1 IMPLANT
DRAIN CHANNEL 19F RND (DRAIN) ×2 IMPLANT
DRAPE LAPAROSCOPIC ABDOMINAL (DRAPES) ×2 IMPLANT
DRAPE SURG 17X23 STRL (DRAPES) ×2 IMPLANT
DRAPE UTILITY XL STRL (DRAPES) ×2 IMPLANT
DRSG OPSITE POSTOP 4X6 (GAUZE/BANDAGES/DRESSINGS) ×2 IMPLANT
DRSG PAD ABDOMINAL 8X10 ST (GAUZE/BANDAGES/DRESSINGS) ×4 IMPLANT
ELECT BLADE 4.0 EZ CLEAN MEGAD (MISCELLANEOUS) ×2
ELECT COATED BLADE 2.86 ST (ELECTRODE) ×2 IMPLANT
ELECT REM PT RETURN 9FT ADLT (ELECTROSURGICAL) ×2
ELECTRODE BLDE 4.0 EZ CLN MEGD (MISCELLANEOUS) ×1 IMPLANT
ELECTRODE REM PT RTRN 9FT ADLT (ELECTROSURGICAL) ×1 IMPLANT
EVACUATOR SILICONE 100CC (DRAIN) ×2 IMPLANT
GAUZE SPONGE 4X4 12PLY STRL LF (GAUZE/BANDAGES/DRESSINGS) IMPLANT
GLOVE BIO SURGEON STRL SZ 6.5 (GLOVE) ×8 IMPLANT
GLOVE BIO SURGEON STRL SZ7.5 (GLOVE) ×4 IMPLANT
GOWN STRL REUS W/ TWL LRG LVL3 (GOWN DISPOSABLE) ×5 IMPLANT
GOWN STRL REUS W/TWL LRG LVL3 (GOWN DISPOSABLE) ×10
GRAFT FLEX HD 6X16 PLIABLE (Tissue) ×2 IMPLANT
IMPL EXPANDER BREAST 535CC (Breast) ×1 IMPLANT
IMPLANT BREAST 535CC (Breast) ×1 IMPLANT
IMPLANT EXPANDER BREAST 535CC (Breast) ×1 IMPLANT
IV NS 1000ML (IV SOLUTION)
IV NS 1000ML BAXH (IV SOLUTION) IMPLANT
IV NS 500ML (IV SOLUTION)
IV NS 500ML BAXH (IV SOLUTION) IMPLANT
KIT FILL SYSTEM UNIVERSAL (SET/KITS/TRAYS/PACK) ×2 IMPLANT
NS IRRIG 1000ML POUR BTL (IV SOLUTION) ×2 IMPLANT
PACK BASIN DAY SURGERY FS (CUSTOM PROCEDURE TRAY) ×2 IMPLANT
PAD FOAM SILICONE BACKED (GAUZE/BANDAGES/DRESSINGS) IMPLANT
PENCIL SMOKE EVACUATOR (MISCELLANEOUS) ×2 IMPLANT
PIN SAFETY STERILE (MISCELLANEOUS) ×2 IMPLANT
PLASMABLADE 3.0S (MISCELLANEOUS) ×2
SLEEVE SCD COMPRESS KNEE MED (MISCELLANEOUS) ×2 IMPLANT
SPONGE LAP 18X18 RF (DISPOSABLE) ×4 IMPLANT
STRIP SUTURE WOUND CLOSURE 1/2 (MISCELLANEOUS) IMPLANT
SUT MNCRL AB 4-0 PS2 18 (SUTURE) ×4 IMPLANT
SUT MON AB 3-0 SH 27 (SUTURE) ×2
SUT MON AB 3-0 SH27 (SUTURE) ×1 IMPLANT
SUT MON AB 5-0 PS2 18 (SUTURE) ×2 IMPLANT
SUT PDS 3-0 CT2 (SUTURE)
SUT PDS AB 2-0 CT2 27 (SUTURE) ×8 IMPLANT
SUT PDS II 3-0 CT2 27 ABS (SUTURE) IMPLANT
SUT SILK 2 0 SH (SUTURE) ×2 IMPLANT
SUT SILK 3 0 PS 1 (SUTURE) ×2 IMPLANT
SUT VICRYL 3-0 CR8 SH (SUTURE) ×2 IMPLANT
SYR BULB IRRIG 60ML STRL (SYRINGE) ×2 IMPLANT
TOWEL GREEN STERILE FF (TOWEL DISPOSABLE) ×4 IMPLANT
TUBE CONNECTING 20X1/4 (TUBING) ×2 IMPLANT
UNDERPAD 30X36 HEAVY ABSORB (UNDERPADS AND DIAPERS) ×4 IMPLANT
YANKAUER SUCT BULB TIP NO VENT (SUCTIONS) ×2 IMPLANT

## 2020-03-30 NOTE — Anesthesia Procedure Notes (Signed)
Procedure Name: LMA Insertion Date/Time: 03/30/2020 10:08 AM Performed by: Maryella Shivers, CRNA Pre-anesthesia Checklist: Patient identified, Emergency Drugs available, Suction available and Patient being monitored Patient Re-evaluated:Patient Re-evaluated prior to induction Oxygen Delivery Method: Circle system utilized Preoxygenation: Pre-oxygenation with 100% oxygen Induction Type: IV induction Ventilation: Mask ventilation without difficulty LMA: LMA inserted LMA Size: 4.0 Number of attempts: 1 Airway Equipment and Method: Bite block Placement Confirmation: positive ETCO2 Tube secured with: Tape Dental Injury: Teeth and Oropharynx as per pre-operative assessment

## 2020-03-30 NOTE — Anesthesia Procedure Notes (Signed)
Anesthesia Regional Block: Pectoralis block   Pre-Anesthetic Checklist: ,, timeout performed, Correct Patient, Correct Site, Correct Laterality, Correct Procedure, Correct Position, site marked, Risks and benefits discussed,  Surgical consent,  Pre-op evaluation,  At surgeon's request and post-op pain management  Laterality: Right  Prep: chloraprep       Needles:  Injection technique: Single-shot     Needle Length: 9cm  Needle Gauge: 22     Additional Needles: Arrow StimuQuik ECHO Echogenic Stimulating PNB Needle  Procedures:,,,, ultrasound used (permanent image in chart),,,,  Narrative:  Start time: 03/30/2020 9:54 AM End time: 03/30/2020 9:58 AM Injection made incrementally with aspirations every 5 mL.  Performed by: Personally  Anesthesiologist: Oleta Mouse, MD

## 2020-03-30 NOTE — Discharge Instructions (Addendum)
INSTRUCTIONS FOR AFTER SURGERY   You will likely have some questions about what to expect following your operation.  The following information will help you and your family understand what to expect when you are discharged from the hospital.  Following these guidelines will help ensure a smooth recovery and reduce risks of complications.  Postoperative instructions include information on: diet, wound care, medications and physical activity.  AFTER SURGERY Expect to go home after the procedure.  In some cases, you may need to spend one night in the hospital for observation.  DIET This surgery does not require a specific diet.  However, I have to mention that the healthier you eat the better your body can start healing. It is important to increasing your protein intake.  This means limiting the foods with added sugar.  Focus on fruits and vegetables and some meat. It is very important to drink water after your surgery.  If your urine is bright yellow, then it is concentrated, and you need to drink more water.  As a general rule after surgery, you should have 8 ounces of water every hour while awake.  If you find you are persistently nauseated or unable to take in liquids let us know.  NO TOBACCO USE or EXPOSURE.  This will slow your healing process and increase the risk of a wound.  WOUND CARE If you have a drain: Clean with baby wipes for three days.   If you have steri-strips / tape directly attached to your skin leave them in place. It is OK to get these wet.  No baths, pools or hot tubs for two weeks. We close your incision to leave the smallest and best-looking scar. No ointment or creams on your incisions until given the go ahead.  Especially not Neosporin (Too many skin reactions with this one).  A few weeks after surgery you can use Mederma and start massaging the scar. We ask you to wear your binder or sports bra for the first 6 weeks around the clock, including while sleeping. This provides  added comfort and helps reduce the fluid accumulation at the surgery site.  ACTIVITY No heavy lifting until cleared by the doctor.  It is OK to walk and climb stairs. In fact, moving your legs is very important to decrease your risk of a blood clot.  It will also help keep you from getting deconditioned.  Every 1 to 2 hours get up and walk for 5 minutes. This will help with a quicker recovery back to normal.  Let pain be your guide so you don't do too much.  NO, you cannot do the spring cleaning and don't plan on taking care of anyone else.  This is your time for TLC.   WORK Everyone returns to work at different times. As a rough guide, most people take at least 1 - 2 weeks off prior to returning to work. If you need documentation for your job, bring the forms to your postoperative follow up visit.  DRIVING Arrange for someone to bring you home from the hospital.  You may be able to drive a few days after surgery but not while taking any narcotics or valium.  BOWEL MOVEMENTS Constipation can occur after anesthesia and while taking pain medication.  It is important to stay ahead for your comfort.  We recommend taking Milk of Magnesia (2 tablespoons; twice a day) while taking the pain pills.  SEROMA This is fluid your body tried to put in the surgical site.    This is normal but if it creates excessive pain and swelling let us know.  It usually decreases in a few weeks.  MEDICATIONS and PAIN CONTROL At your preoperative visit for you history and physical you were given the following medications: 1. An antibiotic: Start this medication when you get home and take according to the instructions on the bottle. 2. Zofran 4 mg:  This is to treat nausea and vomiting.  You can take this every 6 hours as needed and only if needed. 3. Norco (hydrocodone/acetaminophen) 5/325 mg:  This is only to be used after you have taken the motrin or the tylenol. Every 8 hours as needed. Over the counter Medication to  take: 4. Ibuprofen (Motrin) 600 mg:  Take this every 6 hours.  If you have additional pain then take 500 mg of the tylenol.  Only take the Norco after you have tried these two. 5. Miralax or stool softener of choice: Take this according to the bottle if you take the Norco.  *No Tylenol until 2:30pm  *No Ibuprofen until after 4:30pm   WHEN TO CALL Call your surgeon's office if any of the following occur: . Fever 101 degrees F or greater . Excessive bleeding or fluid from the incision site. . Pain that increases over time without aid from the medications . Redness, warmth, or pus draining from incision sites . Persistent nausea or inability to take in liquids . Severe misshapen area that underwent the operation.  [facility] Plastic Surgery Specialist  What is the benefit of having a drain?  During surgery your tissue layers are separated.  This raw surface stimulates your body to fill the space with serous fluid.  This is normal but you don't want that fluid to collect and prevent healing.  A fluid collection can also become infected.  The Jackson-Pratt (JP) drain is used to eliminate this collection of fluid and allow the tissue to heal together.    Jackson-Pratt (JP) bulb    How to care for your drainage and suction unit at home Your drainage catheter will be connected to a collection device. The vacuum caused when the device is compressed allows drainage to collect in the device.    Wendee Copp your hands with soap and water before and after touching the system. . Empty the JP drain every 12 hours once you get home from your procedure. . Record the fluid amount on the record sheet included. . Start with stripping the drain tube to push the clots or excess fluid to the bulb.  Do this by pinching the tube with one hand near your skin.  Then with the other hand squeeze the tubing and work it toward the bulb.  This should be done several times a day.  This may collapse the tube which will correct on  its own.   . Use a safety pin to attach your collection device to your clothing so there is no tension on the insertion site.   . If you have drainage at the skin insertion site, you can apply a gauze dressing and secure it with tape. . If the drain falls out, apply a gauze dressing over the drain insertion site and secure with tape.   To empty the collection device:   . Release the stopper on the top of the collection unit (bulb).  Signa Kell contents into a measuring container such as a plastic medicine cup.  . Record the day and amount of drainage on the attached sheet. Marland Kitchen  This should be done at least twice a day.    To compress the Jackson-Pratt Bulb:  . Release the stopper at the top of the bulb. Marland Kitchen Squeeze the bulb tightly in your fist, squeezing air out of the bulb.  . Replace the stopper while the bulb is compressed.  . Be careful not to spill the contents when squeezing the bulb. . The drainage will start bright red and turn to pink and then yellow with time. . IMPORTANT: If the bulb is not squeezed before adding the stopper it will not draw out the fluid.  Care for the JP drain site and your skin daily:  . You may shower three days after surgery. . Secure the drain to a ribbon or cloth around your waist while showering so it does not pull out while showering. . Be sure your hands are cleaned with soap and water. . Use a clean wet cotton swab to clean the skin around the drain site.  . Use another cotton swab to place Vaseline or antibiotic ointment on the skin around the drain.     Contact your physician if any of the following occur:  Marland Kitchen The fluid in the bulb becomes cloudy. . Your temperature is greater than 101.4.  Marland Kitchen The incision opens. . If you have drainage at the skin insertion site, you can apply a gauze dressing and secure it with tape. . If the drain falls out, apply a gauze dressing over the drain insertion site and secure with tape.  . You will usually have more drainage  when you are active than while you rest or are asleep. If the drainage increases significantly or is bloody call the physician                             Bring this record with you to each office visit Date  Drainage Volume  Date   Drainage volume                                                                                                                                                                                          Post Anesthesia Home Care Instructions  Activity: Get plenty of rest for the remainder of the day. A responsible individual must stay with you for 24 hours following the procedure.  For the next 24 hours, DO NOT: -Drive a car -Paediatric nurse -Drink alcoholic beverages -Take any medication unless instructed by your physician -Make any legal decisions or sign important papers.  Meals: Start with liquid foods such as gelatin or soup. Progress to regular foods  as tolerated. Avoid greasy, spicy, heavy foods. If nausea and/or vomiting occur, drink only clear liquids until the nausea and/or vomiting subsides. Call your physician if vomiting continues.  Special Instructions/Symptoms: Your throat may feel dry or sore from the anesthesia or the breathing tube placed in your throat during surgery. If this causes discomfort, gargle with warm salt water. The discomfort should disappear within 24 hours.  If you had a scopolamine patch placed behind your ear for the management of post- operative nausea and/or vomiting:  1. The medication in the patch is effective for 72 hours, after which it should be removed.  Wrap patch in a tissue and discard in the trash. Wash hands thoroughly with soap and water. 2. You may remove the patch earlier than 72 hours if you experience unpleasant side effects which may include dry mouth, dizziness or visual disturbances. 3. Avoid touching the patch. Wash your hands with soap and water after contact with the patch.

## 2020-03-30 NOTE — Anesthesia Preprocedure Evaluation (Signed)
Anesthesia Evaluation  Patient identified by MRN, date of birth, ID band Patient awake    Reviewed: Allergy & Precautions, NPO status , Patient's Chart, lab work & pertinent test results  History of Anesthesia Complications Negative for: history of anesthetic complications  Airway Mallampati: III  TM Distance: <3 FB Neck ROM: Full    Dental  (+) Dental Advisory Given, Teeth Intact   Pulmonary neg pulmonary ROS,    breath sounds clear to auscultation       Cardiovascular negative cardio ROS   Rhythm:Regular     Neuro/Psych  Headaches, PSYCHIATRIC DISORDERS Anxiety Depression Covid-19 Nucleic Acid Test Results Lab Results      Component                Value               Date                      [place]              NEGATIVE            03/26/2020               GI/Hepatic negative GI ROS, Neg liver ROS,   Endo/Other  diabetesHypothyroidism   Renal/GU negative Renal ROS     Musculoskeletal negative musculoskeletal ROS (+)   Abdominal   Peds  Hematology negative hematology ROS (+)   Anesthesia Other Findings   Reproductive/Obstetrics                            Anesthesia Physical Anesthesia Plan  ASA: II  Anesthesia Plan: General and Regional   Post-op Pain Management:  Regional for Post-op pain   Induction: Intravenous  PONV Risk Score and Plan: 3 and Ondansetron and Dexamethasone  Airway Management Planned: LMA  Additional Equipment: None  Intra-op Plan:   Post-operative Plan: Extubation in OR  Informed Consent: I have reviewed the patients History and Physical, chart, labs and discussed the procedure including the risks, benefits and alternatives for the proposed anesthesia with the patient or authorized representative who has indicated his/her understanding and acceptance.     Dental advisory given  Plan Discussed with: CRNA and Surgeon  Anesthesia Plan  Comments:         Anesthesia Quick Evaluation

## 2020-03-30 NOTE — Op Note (Signed)
03/30/2020  11:19 AM  PATIENT:  Christy Ferrell  49 y.o. female  PRE-OPERATIVE DIAGNOSIS:  RIGHT BREAST CANCER  POST-OPERATIVE DIAGNOSIS:  RIGHT BREAST CANCER  PROCEDURE:  Procedure(s) with comments: RIGHT MASTECTOMY MODIFIED RADICAL (Right) - PEC BLOCK  SURGEON:  Surgeon(s) and Role: Panel 1:    * Jovita Kussmaul, MD - Primary    * Gosai, Puja, PA-C - Assisting  PHYSICIAN ASSISTANT:   ASSISTANTS: Carlena Hurl, PA   ANESTHESIA:   general  EBL:  minimal   BLOOD ADMINISTERED:none  DRAINS: none   LOCAL MEDICATIONS USED:  NONE  SPECIMEN:  Source of Specimen:  right modified radical mastectomy  DISPOSITION OF SPECIMEN:  PATHOLOGY  COUNTS:  YES  TOURNIQUET:  * No tourniquets in log *  DICTATION: .Dragon Dictation   After informed consent was obtained the patient was brought to the operating room and placed in the supine position on the operating table.  After adequate induction of general anesthesia the patient's bilateral chest, breast, and axillary areas were prepped with ChloraPrep, allowed to dry, and draped in usual sterile manner.  An appropriate timeout was performed.  The patient had a large cancer in the right breast with at least 2+ lymph nodes.  She was not able to be treated with neoadjuvant therapy so she has now elected for a right modified radical mastectomy.  An elliptical incision was made around the nipple and areola complex in order to spare the skin.  The incision was carried through the skin and subcutaneous tissue sharply with the PlasmaBlade.  Breast hooks were then used to elevate the skin flaps anteriorly towards the ceiling.  Thin skin flaps were then created circumferentially by dissecting between the breast tissue and the subcutaneous fat.  This dissection was carried all the way to the chest wall circumferentially.  Next the breast was removed from the pectoralis muscle with the pectoralis fascia sharply with the PlasmaBlade.  Once the dissection reached  the axilla then we were able to identify the latissimus muscle laterally, the serratus muscle medially, and the axillary vein superiorly.  The lymphatic contents within these boundaries was dissected out by blunt right angle dissection.  Several small vessels and lymphatics were controlled with clips as well as several intercostal brachial nerves.  The long thoracic and thoracodorsal nerves were identified and spared.  Once this dissection was accomplished then the entire right axillary contents below the axillary vein were removed with the breast and all of this tissue was sent to pathology for further evaluation.  The right breast was marked with a stitch on the lateral skin.  Hemostasis was achieved using the PlasmaBlade.  The wound was irrigated with saline and packed with a moistened lap sponge.  The patient tolerated the procedure well.  At the end of the this portion of the case all needle sponge and instrument counts were correct.  At this point the operation was turned over to Dr. Marla Roe for the reconstruction.  Her portion of the case will be dictated separately.  PLAN OF CARE: Admit for overnight observation  PATIENT DISPOSITION:  PACU - hemodynamically stable.   Delay start of Pharmacological VTE agent (>24hrs) due to surgical blood loss or risk of bleeding: no

## 2020-03-30 NOTE — H&P (Signed)
[place]  Location: [facility] Surgery Patient #: [id number] DOB: 01/25/1971 Divorced / Language: Cleophus Molt / Race: White Female   History of Present Illness The patient is a 49 year old female who presents with breast cancer. We are asked to see the patient in consultation by Dr. Elsie Stain to evaluate her for a new right breast cancer. The patient is a 49 year old white female who recently felt a mass in her right axilla. She went to her medical doctor and was evaluated with mammogram and ultrasound. She was found to have a 1.6 cm mass in the lower inner quadrant of the right breast with a 5 mm satellite nodule. There was also a 1 cm mass in the subareolar right breast and 2 abnormal lymph nodes. All of this was biopsied. The 2 masses in the lower inner quadrant came back as invasive breast cancer in the subareolar mass came back as ductal carcinoma in situ. The lymph nodes were positive. The cancer was ER and PR positive and HER-2 negative. She does have a family history of breast cancer in a cousin. She does not smoke. She is otherwise in good health.   Past Surgical History  Breast Biopsy  Right. Cesarean Section - Multiple  Gallbladder Surgery - Laparoscopic   Diagnostic Studies History  Colonoscopy  never Mammogram  within last year Pap Smear  1-5 years ago  Allergies  Codeine Sulfate *ANALGESICS - OPIOID*  Allergies Reconciled   Medication History  Levonorgestrel (Oral) Specific strength unknown - Active. Levothyroxine Sodium (25MCG Tablet, Oral) Active. Acyclovir (External) Specific strength unknown - Active. Cyclobenzaprine HCl (Oral) Specific strength unknown - Active. Escitalopram Oxalate (10MG Tablet, Oral) Active. valACYclovir HCl (Oral) Specific strength unknown - Active. Ibuprofen (200MG Tablet, Oral) Active. Medications Reconciled  Pregnancy / Birth History  Age at menarche  16 years. Contraceptive History  Intrauterine  device. Gravida  2 Irregular periods  Length (months) of breastfeeding  >24 Maternal age  71-30 Para  2  Other Problems Anxiety Disorder  Breast Cancer  Cholelithiasis  Lump In Breast     Review of Systems General Not Present- Appetite Loss, Chills, Fatigue, Fever, Night Sweats, Weight Gain and Weight Loss. Skin Not Present- Change in Wart/Mole, Dryness, Hives, Jaundice, New Lesions, Non-Healing Wounds, Rash and Ulcer. HEENT Present- Wears glasses/contact lenses. Not Present- Earache, Hearing Loss, Hoarseness, Nose Bleed, Oral Ulcers, Ringing in the Ears, Seasonal Allergies, Sinus Pain, Sore Throat, Visual Disturbances and Yellow Eyes. Breast Present- Breast Mass. Not Present- Breast Pain, Nipple Discharge and Skin Changes. Cardiovascular Not Present- Chest Pain, Difficulty Breathing Lying Down, Leg Cramps, Palpitations, Rapid Heart Rate, Shortness of Breath and Swelling of Extremities. Gastrointestinal Not Present- Abdominal Pain, Bloating, Bloody Stool, Change in Bowel Habits, Chronic diarrhea, Constipation, Difficulty Swallowing, Excessive gas, Gets full quickly at meals, Hemorrhoids, Indigestion, Nausea, Rectal Pain and Vomiting. Female Genitourinary Not Present- Frequency, Nocturia, Painful Urination, Pelvic Pain and Urgency. Musculoskeletal Not Present- Back Pain, Joint Pain, Joint Stiffness, Muscle Pain, Muscle Weakness and Swelling of Extremities. Neurological Not Present- Decreased Memory, Fainting, Headaches, Numbness, Seizures, Tingling, Tremor, Trouble walking and Weakness. Psychiatric Present- Anxiety. Not Present- Bipolar, Change in Sleep Pattern, Depression, Fearful and Frequent crying. Hematology Not Present- Blood Thinners, Easy Bruising, Excessive bleeding, Gland problems, HIV and Persistent Infections.  Vitals  Weight: 173.13 lb Height: 63in Body Surface Area: 1.82 m Body Mass Index: 30.67 kg/m  Temp.: 98.75F  Pulse: 83 (Regular)  P.OX: 96%  (Room air) BP: 124/74(Sitting, Left Arm, Standard)  Physical Exam  General Mental Status-Alert. General Appearance-Consistent with stated age. Hydration-Well hydrated. Voice-Normal.  Head and Neck Head-normocephalic, atraumatic with no lesions or palpable masses. Trachea-midline. Thyroid Gland Characteristics - normal size and consistency.  Eye Eyeball - Bilateral-Extraocular movements intact. Sclera/Conjunctiva - Bilateral-No scleral icterus.  Chest and Lung Exam Chest and lung exam reveals -quiet, even and easy respiratory effort with no use of accessory muscles and on auscultation, normal breath sounds, no adventitious sounds and normal vocal resonance. Inspection Chest Wall - Normal. Back - normal.  Breast Note: There is a palpable bruise in the lower inner quadrant of the right breast. There is also a palpable enlarged lymph node that is mobile in the right axilla. There is no palpable mass in the left breast. There is no other palpable lymphadenopathy.   Cardiovascular Cardiovascular examination reveals -normal heart sounds, regular rate and rhythm with no murmurs and normal pedal pulses bilaterally.  Abdomen Inspection Inspection of the abdomen reveals - No Hernias. Skin - Scar - no surgical scars. Palpation/Percussion Palpation and Percussion of the abdomen reveal - Soft, Non Tender, No Rebound tenderness, No Rigidity (guarding) and No hepatosplenomegaly. Auscultation Auscultation of the abdomen reveals - Bowel sounds normal.  Neurologic Neurologic evaluation reveals -alert and oriented x 3 with no impairment of recent or remote memory. Mental Status-Normal.  Musculoskeletal Normal Exam - Left-Upper Extremity Strength Normal and Lower Extremity Strength Normal. Normal Exam - Right-Upper Extremity Strength Normal and Lower Extremity Strength Normal.  Lymphatic Head & Neck  General Head & Neck Lymphatics: Bilateral -  Description - Normal. Axillary  General Axillary Region: Bilateral - Description - Normal. Tenderness - Non Tender. Femoral & Inguinal  Generalized Femoral & Inguinal Lymphatics: Bilateral - Description - Normal. Tenderness - Non Tender.    Assessment & Plan  MALIGNANT NEOPLASM OF LOWER-INNER QUADRANT OF RIGHT BREAST OF FEMALE, ESTROGEN RECEPTOR POSITIVE (C50.311) Impression: The patient appears to have an area of invasive breast cancer in the lower inner quadrant of the right breast as well as an area of ductal carcinoma in situ in the subareolar right breast with 2 positive lymph nodes. I have discussed with her in detail the different options for treatment. At this point we will plan to evaluate her with MRI to get a true or size of the area involved. I think there is a good likelihood given the size and the location that she may need a mastectomy. She is also undergoing mammoprint testing to determine if she would benefit from chemotherapy which could be given neoadjuvant lead. Once we have these 2 pieces of information then we can make a more informed choice as to the best plan of action. In the meantime I will also refer her to plastic surgery to talk about reconstruction possibilities. She may also require a Port-A-Cath. I have discussed with her in detail the risks and benefits of the operations as well as some of the technical aspects and she understands and wishes to proceed with our plan. She has met with medical oncology already. I will also refer her to physical therapy for preoperative lymphedema testing. We will talk to her again once the MRI and genetic testing are done. This patient encounter took 60 minutes today to perform the following: take history, perform exam, review outside records, interpret imaging, counsel the patient on their diagnosis and document encounter, findings & plan in the EHR Current Plans Referred to Surgery - Plastic, for evaluation and follow up (Plastic  Surgery). Routine. Referred to Physical  Therapy, for evaluation and follow up (Physical Therapy). Routine.

## 2020-03-30 NOTE — Interval H&P Note (Signed)
History and Physical Interval Note:  03/30/2020 9:32 AM  Christy Ferrell  has presented today for surgery, with the diagnosis of RIGHT BREAST CANCER.  The various methods of treatment have been discussed with the patient and family. After consideration of risks, benefits and other options for treatment, the patient has consented to  Procedure(s) with comments: RIGHT MASTECTOMY MODIFIED RADICAL (Right) - PEC BLOCK, RNFA BREAST RECONSTRUCTION WITH PLACEMENT OF TISSUE EXPANDER AND FLEX HD (ACELLULAR HYDRATED DERMIS) (Right) as a surgical intervention.  The patient's history has been reviewed, patient examined, no change in status, stable for surgery.  I have reviewed the patient's chart and labs.  Questions were answered to the patient's satisfaction.     Autumn Messing III

## 2020-03-30 NOTE — Transfer of Care (Signed)
Immediate Anesthesia Transfer of Care Note  Patient: Christy Ferrell  Procedure(s) Performed: RIGHT MASTECTOMY MODIFIED RADICAL (Right Breast) BREAST RECONSTRUCTION WITH PLACEMENT OF TISSUE EXPANDER AND FLEX HD (ACELLULAR HYDRATED DERMIS) (Right Breast)  Patient Location: PACU  Anesthesia Type:General  Level of Consciousness: sedated  Airway & Oxygen Therapy: Patient Spontanous Breathing and Patient connected to face mask oxygen  Post-op Assessment: Report given to RN and Post -op Vital signs reviewed and stable  Post vital signs: Reviewed and stable  Last Vitals:  Vitals Value Taken Time  BP 97/54 03/30/20 1208  Temp    Pulse 100 03/30/20 1210  Resp 17 03/30/20 1209  SpO2 97 % 03/30/20 1210  Vitals shown include unvalidated device data.  Last Pain:  Vitals:   03/30/20 0823  TempSrc: Oral  PainSc: 0-No pain      Patients Stated Pain Goal: 3 (79/43/27 [id number])  Complications: No complications documented.

## 2020-03-30 NOTE — Progress Notes (Signed)
Assisted Dr. Moser with right, ultrasound guided, pectoralis block. Side rails up, monitors on throughout procedure. See vital signs in flow sheet. Tolerated Procedure well. 

## 2020-03-30 NOTE — Op Note (Signed)
Op report    DATE OF OPERATION:  03/30/2020  LOCATION: [place]  SURGICAL DIVISION: Plastic Surgery  PREOPERATIVE DIAGNOSES:  1. Right Breast cancer.    POSTOPERATIVE DIAGNOSES:  1. Right Breast cancer.   PROCEDURE:  1. Right immediate breast reconstruction with placement of Acellular Dermal Matrix and tissue expanders.  SURGEON: Marcha Licklider [place] Danny Yackley, DO  ASSISTANT: Roetta Sessions, PA  ANESTHESIA:  General.   COMPLICATIONS: None.   IMPLANTS: Right - Mentor 535 cc. Ref #[id number].  Serial Number [id number], 200 cc of injectable saline placed in the expander. Acellular Dermal Matrix 6 x 16 cm Flex HD  INDICATIONS FOR PROCEDURE:  The patient, Christy Ferrell, is a 49 y.o. female born on 08-09-1970, is here for  immediate first stage breast reconstruction with placement of a right tissue expander and Acellular dermal matrix. MRN: [id number]  CONSENT:  Informed consent was obtained directly from the patient. Risks, benefits and alternatives were fully discussed. Specific risks including but not limited to bleeding, infection, hematoma, seroma, scarring, pain, implant infection, implant extrusion, capsular contracture, asymmetry, wound healing problems, and need for further surgery were all discussed. The patient did have an ample opportunity to have her questions answered to her satisfaction.   DESCRIPTION OF PROCEDURE:  The patient was taken to the operating room by the general surgery team. SCDs were placed and IV antibiotics were given. The patient's chest was prepped and draped in a sterile fashion. A time out was performed and the implants to be used were identified.  A right mastectomy were performed.  Once the general surgery team had completed their portion of the case the patient was rendered to the plastic and reconstructive surgery team.  Right:  The pectoralis major muscle was lifted from the chest wall with release of the lateral edge and  lateral inframammary fold.  The pocket was irrigated with antibiotic solution and hemostasis was achieved with electrocautery.  The ADM was then prepared according to the manufacture guidelines and slits placed to help with postoperative fluid management.  The ADM was then sutured to the inferior and lateral edge of the inframammary fold with 2-0 PDS starting with an interrupted stitch and then a running stitch.  The lateral portion was sutured to with interrupted sutures after the expander was placed.  The expander was prepared according to the manufacture guidelines, the air evacuated and then it was placed under the ADM and pectoralis major muscle.  The inferior and lateral tabs were used to secure the expander to the chest wall with 2-0 PDS.  The drain was placed at the inframammary fold over the ADM and secured to the skin with 3-0 Silk.    The deep layers were closed with 3-0 Monocryl followed by 4-0 Monocryl.  The skin was closed with 5-0 Monocryl and then dermabond was applied.  The ABDs and breast binder were placed.  The patient tolerated the procedure well and there were no complications.  The patient was allowed to wake from anesthesia and taken to the recovery room in satisfactory condition.   The advanced practice practitioner (APP) assisted throughout the case.  The APP was essential in retraction and counter traction when needed to make the case progress smoothly.  This retraction and assistance made it possible to see the tissue plans for the procedure.  The assistance was needed for blood control, tissue re-approximation and assisted with closure of the incision site.

## 2020-03-30 NOTE — Anesthesia Postprocedure Evaluation (Signed)
Anesthesia Post Note  Patient: Christy Ferrell  Procedure(s) Performed: RIGHT MASTECTOMY MODIFIED RADICAL (Right Breast) BREAST RECONSTRUCTION WITH PLACEMENT OF TISSUE EXPANDER AND FLEX HD (ACELLULAR HYDRATED DERMIS) (Right Breast)     Patient location during evaluation: PACU Anesthesia Type: Regional and General Level of consciousness: awake and alert Pain management: pain level controlled Vital Signs Assessment: post-procedure vital signs reviewed and stable Respiratory status: spontaneous breathing, nonlabored ventilation, respiratory function stable and patient connected to nasal cannula oxygen Cardiovascular status: blood pressure returned to baseline and stable Postop Assessment: no apparent nausea or vomiting Anesthetic complications: no   No complications documented.  Last Vitals:  Vitals:   03/30/20 1345 03/30/20 1410  BP: 114/64 116/66  Pulse: 96 90  Resp: 18 18  Temp:  37.1 C  SpO2: 97% 95%    Last Pain:  Vitals:   03/30/20 1410  TempSrc:   PainSc: 0-No pain                 Tayvin Preslar

## 2020-04-01 ENCOUNTER — Encounter (HOSPITAL_BASED_OUTPATIENT_CLINIC_OR_DEPARTMENT_OTHER): Payer: Self-pay | Admitting: General Surgery

## 2020-04-01 ENCOUNTER — Telehealth: Payer: Self-pay | Admitting: Surgical

## 2020-04-01 LAB — SURGICAL PATHOLOGY

## 2020-04-01 NOTE — Telephone Encounter (Signed)
Patient called to see if someone could give her clarity on how to clean the surgical area. She said she wasn't sure if she should remove the gauze material right against the skin or clean around it. Also, she knows she has to wait 5 days before she can shower, but she said she has drains and wanted to get some clarity on this as well. Please call [phone] to advise.

## 2020-04-02 ENCOUNTER — Encounter: Payer: Self-pay | Admitting: *Deleted

## 2020-04-02 NOTE — Telephone Encounter (Signed)
Called the patient back on yesterday regarding her message below.  Informed the patient that I spoke with [facility] regarding her message below.    Informed the patient that she can shower on (04/04/20).  She's to put soap on her shoulders and let the water hit her shoulders and run down across the breasts.  Patient verbalized understanding and agreed.//AB/CMA

## 2020-04-03 ENCOUNTER — Telehealth: Payer: Self-pay | Admitting: Plastic Surgery

## 2020-04-03 NOTE — Telephone Encounter (Signed)
Patient called to get some clarity on showering tomorrow with her tube. She isn't sure how to hold/manuever with the tube while in the shower and wanted to know if someone could give her some help on this.

## 2020-04-06 NOTE — Telephone Encounter (Signed)
Called and spoke with the patient on (04/03/20) regarding her message below.  Patient stated that she was unsure what to do regarding the padding around the tubing that going into her skin, and what is she to do with the bulb where she's showering.    Informed the patient that I spoke with [facility] and he stated that she can take the padding around the tubing off and its okay to let the water run down over it.    Informed her as far as the drain she can hold the drain in her hand while bathing.  It's okay if they get wet.  Patient verbalized understanding and agreed.//AB/CMA

## 2020-04-07 ENCOUNTER — Other Ambulatory Visit: Payer: Self-pay

## 2020-04-07 ENCOUNTER — Encounter: Payer: Self-pay | Admitting: Plastic Surgery

## 2020-04-07 ENCOUNTER — Ambulatory Visit (INDEPENDENT_AMBULATORY_CARE_PROVIDER_SITE_OTHER): Payer: No Typology Code available for payment source | Admitting: Plastic Surgery

## 2020-04-07 VITALS — BP 116/75 | HR 82 | Temp 98.0°F

## 2020-04-07 DIAGNOSIS — Z170 Estrogen receptor positive status [ER+]: Secondary | ICD-10-CM

## 2020-04-07 DIAGNOSIS — C50511 Malignant neoplasm of lower-outer quadrant of right female breast: Secondary | ICD-10-CM

## 2020-04-07 NOTE — Progress Notes (Signed)
   Subjective:    Patient ID: Christy Ferrell, female    DOB: [birth year], 49 y.o.   MRN: [id number]  The patient is a 49 year old female here for follow-up after undergoing a right mastectomy last week.  She had a Mentor 535 cc expander placed and had 200 cc infused into it.  Her drain is in place and has minimal drainage.  Too soon to have it removed today.  Her incisions are healing nicely and her dressings are in place.  There is no sign of hematoma or seroma.     Review of Systems  Constitutional: Negative.   HENT: Negative.   Eyes: Negative.   Respiratory: Negative.   Cardiovascular: Negative.   Endocrine: Negative.   Genitourinary: Negative.        Objective:   Physical Exam Vitals and nursing note reviewed.  Constitutional:      Appearance: Normal appearance.  Cardiovascular:     Rate and Rhythm: Normal rate.     Pulses: Normal pulses.  Pulmonary:     Effort: Pulmonary effort is normal.  Neurological:     General: No focal deficit present.     Mental Status: She is alert and oriented to person, place, and time.  Psychiatric:        Mood and Affect: Mood normal.        Assessment & Plan:     ICD-10-CM   1. Malignant neoplasm of lower-outer quadrant of right breast of female, estrogen receptor positive ([place])  C50.511    Z17.0      We placed injectable saline in the Expander using a sterile technique: Right: 80 cc for a total of 280 / 535 cc  We will plan to remove the drain next week.

## 2020-04-14 ENCOUNTER — Encounter: Payer: Self-pay | Admitting: Surgical

## 2020-04-14 ENCOUNTER — Other Ambulatory Visit: Payer: Self-pay

## 2020-04-14 ENCOUNTER — Ambulatory Visit (INDEPENDENT_AMBULATORY_CARE_PROVIDER_SITE_OTHER): Payer: No Typology Code available for payment source | Admitting: Surgical

## 2020-04-14 VITALS — BP 144/87 | HR 79 | Temp 98.0°F

## 2020-04-14 DIAGNOSIS — Z170 Estrogen receptor positive status [ER+]: Secondary | ICD-10-CM

## 2020-04-14 DIAGNOSIS — C50511 Malignant neoplasm of lower-outer quadrant of right female breast: Secondary | ICD-10-CM

## 2020-04-14 NOTE — Progress Notes (Signed)
Patient is a 49 year old female here for follow-up after undergoing a right mastectomy on 03/30/2020 with Dr. Marlou Starks followed by right breast reconstruction with placement of a 535 tissue expander with Dr. Marla Roe.  Patient tolerated the last fill fine without any issues.   Patient reports she is scheduled for radiation in approximately 1 month.  She is planning to see Dr. Marlou Starks in 1 week.  Chaperone present on exam On exam right breast mastectomy flaps are viable with good color, no necrosis noted.  Right JP drain in place with 5 cc of serosanguineous drainage in bulb.  No erythema noted.  No cellulitic changes noted.  No tenderness palpation.  Honeycomb dressing and Steri-Strips in place.  Incision CDI  We placed injectable saline in the Expander using a sterile technique: Right: 80 cc for a total of 360/ 535 cc Patient tolerated this fine.  Recommend taking Valium every 12 hours as needed for cramping/pain to the right breast/pectoralis  We removed the right JP drain due to low output.  Recommend Vaseline and gauze over the JP drain insertion site until it has healed. Recommend following up in 1 week for reevaluation, we may fill again at that time due to the plan for radiation.

## 2020-04-14 NOTE — Discharge Summary (Signed)
Physician Discharge Summary  Patient ID: SHAIVI ROTHSCHILD MRN: [id number] DOB/AGE: [birth year] 49 y.o.  Admit date: 03/30/2020 Discharge date: 04/14/2020  Admission Diagnoses:  Discharge Diagnoses:  Active Problems:   Breast cancer [facility])   Discharged Condition: good  Hospital Course: The patient is a 49 year old female who was admitted and taken to the operating room with general surgery and plastic surgery.  She underwent a right mastectomy for cancer.  She had immediate reconstruction with placement of an expander and Flex HD.  She was stable throughout the case.  She was managed in PACU after surgery.  She requested discharge because she was tolerating food well.  Her pain was well controlled.  Patient met discharge guidelines and was therefore discharged.    Consults: None  Significant Diagnostic Studies: none  Treatments: surgery:   Discharge Exam: Blood pressure 116/66, pulse 90, temperature 98.7 F (37.1 C), resp. rate 18, height $RemoveBe'5\' 3"'APdXhdOHD$  (1.6 m), weight 79.9 kg, SpO2 95 %. General appearance: alert, cooperative and no distress Incision/Wound: intact  Disposition: Discharge disposition: 01-Home or Self Care        Allergies as of 03/30/2020      Reactions   Codeine Nausea And Vomiting   Zoloft [sertraline Hcl]    headaches      Medication List    TAKE these medications   acyclovir ointment 5 % Commonly known as: Zovirax Apply 1 application topically every 3 (three) hours. As needed.   cholecalciferol 25 MCG (1000 UNIT) tablet Commonly known as: VITAMIN D3 Take 1,000 Units by mouth daily.   cyclobenzaprine 10 MG tablet Commonly known as: FLEXERIL TAKE 1/2 TO 1 TABLET BY MOUTH EVERY DAY AS NEEDED   diazepam 2 MG tablet Commonly known as: Valium Take 1 tablet (2 mg total) by mouth every 12 (twelve) hours as needed for muscle spasms.   escitalopram 10 MG tablet Commonly known as: LEXAPRO Take 1 tablet (10 mg total) by mouth in the morning and at  bedtime.   ibuprofen 200 MG tablet Commonly known as: ADVIL Take 200 mg by mouth every 6 (six) hours as needed.   levonorgestrel 20 MCG/24HR IUD Commonly known as: MIRENA 1 each by Intrauterine route once. 11/05/12   levothyroxine 25 MCG tablet Commonly known as: SYNTHROID Take 1 tablet (25 mcg total) by mouth daily before breakfast.   ondansetron 4 MG tablet Commonly known as: Zofran Take 1 tablet (4 mg total) by mouth every 8 (eight) hours as needed for nausea or vomiting.   QC TUMERIC COMPLEX PO Take by mouth.   tamoxifen 20 MG tablet Commonly known as: NOLVADEX Take 20 mg by mouth daily.   valACYclovir 1000 MG tablet Commonly known as: Valtrex Take 2 tablets (2,000 mg total) by mouth 2 (two) times daily as needed. For 1 day.       Follow-up Information    Abrar Bilton, Loel Lofty, DO In 10 days.   Specialty: Plastic Surgery Contact information: [place] [address] [phone]               Signed: Loel Lofty Kenlynn Houde 04/14/2020, 1:05 PM

## 2020-04-21 ENCOUNTER — Encounter: Payer: Self-pay | Admitting: Surgical

## 2020-04-21 ENCOUNTER — Other Ambulatory Visit: Payer: Self-pay

## 2020-04-21 ENCOUNTER — Ambulatory Visit (INDEPENDENT_AMBULATORY_CARE_PROVIDER_SITE_OTHER): Payer: No Typology Code available for payment source | Admitting: Surgical

## 2020-04-21 VITALS — BP 136/77 | HR 83 | Temp 98.3°F

## 2020-04-21 DIAGNOSIS — Z170 Estrogen receptor positive status [ER+]: Secondary | ICD-10-CM

## 2020-04-21 DIAGNOSIS — C50511 Malignant neoplasm of lower-outer quadrant of right female breast: Secondary | ICD-10-CM

## 2020-04-21 NOTE — Progress Notes (Signed)
Patient is a 49 year old female here for follow-up after undergoing right mastectomy by Dr. Marlou Starks followed by right breast reconstruction with placement of tissue expander on 03/30/2009 with Dr. Marla Roe.    Patient reports she is scheduled for radiation, she is unsure when this is scheduled.  She believes that the plan previously discussed with the multidisciplinary team was to undergo radiation and then exchange of expander to implant.  She reports that she is scheduled to see oncology tomorrow to further discuss her treatment.  She reports that she has noticed some fullness/fluid within the part of her breast closest to the armpit.  On exam right mastectomy incision intact, healing nicely.  Mastectomy flaps with good vascularity, no necrosis noted.  She does have a fluid wave on exam, most of this is inferior and lateral.  There is no erythema.  No incisional dehiscence noted.   We placed injectable saline in the Expander using a sterile technique: Right: 50 cc for a total of 410/535 cc  I was unable to aspirate any fluid from the right breast as the fluid was very inferior and her port site is very superior.  I discussed with the patient that the fluid should resolve on its own, I am not comfortable placing the needle into the axillary region due to risk of popping the expander.  I will see the patient back in 1 week for reevaluation, additional fill.  I discussed with the patient that I would discuss the plan further with Dr. Marla Roe to determine the reconstructive plan.  Patient reported that she previously discussed with the multidisciplinary team that she would undergo expander placement, radiation, then exchange of implants.  She is scheduled to see oncology tomorrow.  I reported to the patient that I would call her if I have any updates for her.

## 2020-04-21 NOTE — Progress Notes (Signed)
[place]  Telephone:([phone] Fax:(336) [phone]     ID: Christy Ferrell DOB: Apr 10, 1971  MR#: [id number]  LOV#:[id number]  Patient Care Team: [name], MD as PCP - General (Family Medicine) Mauro Kaufmann, RN as Oncology Nurse Navigator Rockwell Germany, RN as Oncology Nurse Navigator Pasqualino Witherspoon, Virgie Dad, MD as Consulting Physician (Oncology) Jovita Kussmaul, MD as Consulting Physician (General Surgery) Dillingham, Loel Lofty, DO as Attending Physician (Plastic Surgery) Chauncey Cruel, MD OTHER MD:  CHIEF COMPLAINT: Estrogen receptor positive breast cancer (s/p right mastectomy)  CURRENT TREATMENT: tamoxifen   INTERVAL HISTORY: Christy "Christy Ferrell" returns today for follow up of her estrogen receptor positive breast cancer.   She was started on tamoxifen at her last visit on 02/13/2020 in anticipation of surgical delays.  She is tolerating this with some hot flashes but no other significant side effects.  Since her last visit, her genetic testing results returned and were negative.  She underwent left breast MRI on 02/19/2020 in anticipation of undergoing biopsy of an area seen on MRI on 02/06/2020. However, the mass was not identified and biopsy was not performed.  She opted to proceed with right mastectomy under Dr. Marlou Starks and reconstruction under Dr. Marla Roe on 03/30/2020. Pathology from the procedure ([id number]) showed: invasive and in situ ductal carcinoma, grade 2, greater than 5 cm; margins not involved; there was lymphovascular space involvement.  Out of the 9 biopsied lymph nodes, 2 were positive for metastatic carcinoma (2/9).  Her case was presented at the multidisciplinary breast cancer conference this morning and postmastectomy radiation and antiestrogens was the consensus.   REVIEW OF SYSTEMS: Christy Ferrell did well with her surgery, with no significant pain, swelling, bleeding or redness.  She has a little bit of fluid accumulation in the  right axilla noted after the drain was removed.  This is not a problem right now and it is not painful or hot.  The expander is comfortable.  A detailed review of systems was otherwise noncontributory   COVID 19 VACCINATION STATUS:    HISTORY OF CURRENT ILLNESS: From the original visit note:  Christy Ferrell herself palpated a right breast mass. She underwent bilateral diagnostic mammography with tomography and right breast ultrasonography at [facility] on 01/15/2020 showing: breast density category B; palpable 1.6 cm mass in right breast at 5:30, with adjacent 5 mm probable satellite mass; 0.9 cm mass in retroareolar right breast, also at 5:30; two enlarged lymph nodes in right breast at 9:30.  Accordingly on 01/17/2020 she proceeded to biopsy of the right breast areas in question. The pathology from this procedure ([id number]) showed:  1. Right Breast, 5:30  - invasive mammary carcinoma, grade 2  - ductal carcinoma in situ, low grade.   - Prognostic indicators significant for: estrogen receptor, 95% positive and progesterone receptor, 95% positive, both with strong staining intensity. Proliferation marker Ki67 not obtained. HER2 negative by immunohistochemistry (0). 2. Right Breast, retroareolar  - ductal carcinoma in situ 3. Lymph Node, right axilla  - invasive mammary carcinoma  - lymph node tissue not identified  We do not have an e-cadherin or Ki67 on this tissue.  The patient's subsequent history is as detailed below.   PAST MEDICAL HISTORY: Past Medical History:  Diagnosis Date  . Anxiety    h/o, was prev on zoloft  . Breast cancer ([place]) 01/2020   seeing Dr. Marlou Starks  . Cholelithiasis   . Diabetes mellitus without complication ([place])   . Family history  of breast cancer   . Family history of leukemia   . Family history of lung cancer   . Family history of prostate cancer   . Family history of stomach cancer   . Hypothyroidism    postpartum hypothyroidism  . IUD  (intrauterine device) in place    mirena per [employer]  . Major depression     PAST SURGICAL HISTORY: Past Surgical History:  Procedure Laterality Date  . BREAST RECONSTRUCTION WITH PLACEMENT OF TISSUE EXPANDER AND FLEX HD (ACELLULAR HYDRATED DERMIS) Right 03/30/2020   Procedure: BREAST RECONSTRUCTION WITH PLACEMENT OF TISSUE EXPANDER AND FLEX HD (ACELLULAR HYDRATED DERMIS);  Surgeon: Wallace Going, DO;  Location: [place];  Service: Plastics;  Laterality: Right;  . CESAREAN SECTION     X 2 Fetal stress, second scheduled  . CHOLECYSTECTOMY  05/1998  . MASTECTOMY MODIFIED RADICAL Right 03/30/2020   Procedure: RIGHT MASTECTOMY MODIFIED RADICAL;  Surgeon: Jovita Kussmaul, MD;  Location: [facility];  Service: General;  Laterality: Right;  PEC BLOCK, RNFA    FAMILY HISTORY: Family History  Problem Relation Age of Onset  . Hypertension Father   . Depression Father   . Prostate cancer Father 57       Prostate, S/P prostatectomy, metastatic  . Asthma Sister   . Spina bifida Sister   . Hypothyroidism Mother   . Hypertension Maternal Grandmother   . Hyperlipidemia Maternal Grandmother   . Lung disease Maternal Grandmother   . Lung cancer Maternal Grandmother 74  . Hypothyroidism Maternal Grandfather   . Prostate cancer Maternal Grandfather   . Cancer Maternal Grandfather        lung, stomach, and prostate cancers  . Emphysema Paternal Grandmother   . Diabetes Maternal Uncle   . Diabetes Maternal Uncle   . Leukemia Maternal Uncle 50  . Prostate cancer Maternal Uncle        dx in his 13s/70s, metastatic  . Breast cancer Other 45       negative genetic testing; maternal first cousin  . Stroke Neg Hx   . Colon cancer Neg Hx     The patient's father died at age 42 from prostate cancer.  The patient's mother is 22 years old as of September 2021.  The patient has 1 uncle with pancreatic cancer and one cousin on the same side of the family with  breast cancer.   GYNECOLOGIC HISTORY:  No LMP recorded. (Menstrual status: IUD). Menarche: 49 years old Age at first live birth: 49 years old [place] P 2 Contraceptive Mirena in place Hysterectomy?  No BSO?  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  She is divorced.  She lives alone although her significant other Christy Ferrell (who is disabled secondary to COPD) sometimes comes over.  Her sons are Christy Ferrell who is Chief Strategy Officer at [employer] and Christy Ferrell who is also at [employer] considering dentistry or physical therapy   ADVANCED DIRECTIVES: Not in place.  The patient intends to name her older son as her healthcare power of attorney and the appropriate documents were giving her on 01/29/2020 for her to complete and notarized at her discretion   HEALTH MAINTENANCE: Social History   Tobacco Use  . Smoking status: Never Smoker  . Smokeless tobacco: Never Used  Vaping Use  . Vaping Use: Never used  Substance Use Topics  . Alcohol use: Yes    Alcohol/week: 0.0  standard drinks    Comment: Very rarely.  . Drug use: No     Colonoscopy:   PAP: Not up-to-date  Bone density: Never   Allergies  Allergen Reactions  . Codeine Nausea And Vomiting  . Zoloft [Sertraline Hcl]     headaches    Current Outpatient Medications  Medication Sig Dispense Refill  . acyclovir ointment (ZOVIRAX) 5 % Apply 1 application topically every 3 (three) hours. As needed. 5 g 5  . cholecalciferol (VITAMIN D3) 25 MCG (1000 UNIT) tablet Take 1,000 Units by mouth daily.    . cyclobenzaprine (FLEXERIL) 10 MG tablet TAKE 1/2 TO 1 TABLET BY MOUTH EVERY DAY AS NEEDED 30 tablet 5  . diazepam (VALIUM) 2 MG tablet Take 1 tablet (2 mg total) by mouth every 12 (twelve) hours as needed for muscle spasms. 20 tablet 0  . escitalopram (LEXAPRO) 10 MG tablet Take 1 tablet (10 mg total) by mouth in the morning and at bedtime. 180 tablet 3  .  ibuprofen (ADVIL,MOTRIN) 200 MG tablet Take 200 mg by mouth every 6 (six) hours as needed.    Marland Kitchen levonorgestrel (MIRENA) 20 MCG/24HR IUD 1 each by Intrauterine route once. 11/05/12    . levothyroxine (SYNTHROID) 25 MCG tablet Take 1 tablet (25 mcg total) by mouth daily before breakfast. 90 tablet 3  . ondansetron (ZOFRAN) 4 MG tablet Take 1 tablet (4 mg total) by mouth every 8 (eight) hours as needed for nausea or vomiting. (Patient not taking: Reported on 04/14/2020) 20 tablet 0  . tamoxifen (NOLVADEX) 20 MG tablet Take 20 mg by mouth daily.    . Turmeric (QC TUMERIC COMPLEX PO) Take by mouth.    . valACYclovir (VALTREX) 1000 MG tablet Take 2 tablets (2,000 mg total) by mouth 2 (two) times daily as needed. For 1 day. 20 tablet 5   No current facility-administered medications for this visit.    OBJECTIVE: White woman who appears younger than stated age  Vitals:   04/22/20 1142  BP: 135/89  Pulse: 100  Resp: 18  Temp: 98.4 F (36.9 C)  SpO2: 97%     Body mass index is 30.82 kg/m.   Wt Readings from Last 3 Encounters:  04/22/20 174 lb (78.9 kg)  03/30/20 176 lb 2.4 oz (79.9 kg)  03/17/20 176 lb 6.4 oz (80 kg)      ECOG FS:1 - Symptomatic but completely ambulatory  Sclerae unicteric, EOMs intact Wearing a mask No cervical or supraclavicular adenopathy Lungs no rales or rhonchi Heart regular rate and rhythm Abd soft, nontender, positive bowel sounds MSK no focal spinal tenderness, no upper extremity lymphedema Neuro: nonfocal, well oriented, appropriate affect Breasts: The right breast is status post mastectomy with expander in place.  There is no erythema, dehiscence, and only minimal swelling in the right axilla.  The left breast is benign.   LAB RESULTS:  CMP     Component Value Date/Time   NA 140 01/29/2020 1526   K 3.8 01/29/2020 1526   CL 104 01/29/2020 1526   CO2 29 01/29/2020 1526   GLUCOSE 125 (H) 01/29/2020 1526   BUN 18 01/29/2020 1526   CREATININE 0.84  01/29/2020 1526   CALCIUM 10.3 01/29/2020 1526   PROT 8.2 (H) 01/29/2020 1526   ALBUMIN 4.1 01/29/2020 1526   AST 22 01/29/2020 1526   ALT 31 01/29/2020 1526   ALKPHOS 78 01/29/2020 1526   BILITOT 0.5 01/29/2020 1526   GFRNONAA >60 01/29/2020 1526   GFRAA >60 01/29/2020  1526    No results found for: TOTALPROTELP, ALBUMINELP, A1GS, A2GS, BETS, BETA2SER, GAMS, MSPIKE, SPEI  Lab Results  Component Value Date   WBC 11.3 (H) 01/29/2020   NEUTROABS 6.0 01/29/2020   HGB 14.7 01/29/2020   HCT 42.8 01/29/2020   MCV 88.2 01/29/2020   PLT 306 01/29/2020    No results found for: LABCA2  No components found for: VMKVJD921  No results for input(s): INR in the last 168 hours.  No results found for: LABCA2  No results found for: HOF974  No results found for: TVV190  No results found for: UQB938  No results found for: CA2729  No components found for: HGQUANT  No results found for: CEA1 / No results found for: CEA1   No results found for: AFPTUMOR  No results found for: CHROMOGRNA  No results found for: KPAFRELGTCHN, LAMBDASER, KAPLAMBRATIO (kappa/lambda light chains)  No results found for: HGBA, HGBA2QUANT, HGBFQUANT, HGBSQUAN (Hemoglobinopathy evaluation)   No results found for: LDH  No results found for: IRON, TIBC, IRONPCTSAT (Iron and TIBC)  No results found for: FERRITIN  Urinalysis    Component Value Date/Time   BILIRUBINUR negative 02/25/2015 1645   PROTEINUR + 0.15 02/25/2015 1645   UROBILINOGEN 0.2 02/25/2015 1645   NITRITE negative 02/25/2015 1645   LEUKOCYTESUR small (1+) (A) 02/25/2015 1645    STUDIES: No results found.   ELIGIBLE FOR AVAILABLE RESEARCH PROTOCOL: AET  ASSESSMENT: 49 y.o. Christy Ferrell woman status post right breast lower outer quadrant biopsy 01/17/2020  for a clinical T1c N1, stage IB invasive mammary carcinoma, grade 2, estrogen and progesterone receptor positive, HER-2 not amplified  (a) biopsy of the left axillary "lymph  node" was positive but included no lymph node tissue  (b) biopsy of a suspicious periareoral area was positive for ductal carcinoma in situ, grade 1  (c) biopsy of a left breast LOQ mass noted on 02/06/2020 breast MRI pending  (d) CT chest and bone scan 02/11/2020 show no evidence of metastatic disease  (1) genetics testing in process  (2) MammaPrint obtained from the 01/17/2020 biopsy read as "low risk" predicting a 96% chance of distant disease free survival at 5 years without the need for chemotherapy  (3) tamoxifen started 02/13/2020 in anticipation of surgical delays  (a) to start ovarian suppression 04/30/2020 with monthly goserelin/Zoladex  (4) status post right modified radical mastectomy 03/30/2020 for a pT3 pN1, stage IIA invasive ductal carcinoma, grade 2, with evidence of lymphovascular space involvement but negative margins  (a) a total of 9 right axillary lymph nodes removed, 2+ with focal extracapsular extension  (5) adjuvant radiation to follow    PLAN: Christy Ferrell did well with her right modified radical mastectomy and is undergoing reconstruction so far without event.  There is small amount of fluid in the right axilla is not an issue right now and it may be reabsorbed by the body without further interventions.  She is going to continue the expansion process and at some point once this is sufficiently advance she will start her adjuvant radiation.  She is tolerating tamoxifen well and the plan will be to continue that for a total of 10 years.  Given the T3 size of her tumor and the 2+ lymph nodes I think it is worthwhile we viewing the possibility of chemotherapy.  Certainly a T3N1 patients were included in the MINDACT trial.  A low risk reading does not exclude all possible benefit of chemotherapy of course.  There is a small possible benefit which  can approach 5% in younger patients.  It is felt that the benefit in those cases may well be due to ovarian suppression.  Christy Ferrell of  course is 49, right on the borderline, but she is still premenopausal.  I think rather than adding chemotherapy to this luminal a type of breast cancer, which is likely to be futile, she would benefit from ovarian suppression together with tamoxifen.  We discussed the possible toxicities side effects and complications of starting goserelin and she is agreeable to beginning 04/30/2020.  She will see me with the second dose, a month later just to make sure she is tolerating this well  I have requested that her IUD be removed and she was already planning on this with her gynecologist.  She knows to call for any other issue that may develop before the next visit.  Total encounter time 35 minutes.    Virgie Dad. Gayle Martinez, MD 04/22/2020 3:36 PM Medical Oncology and Hematology [facility] [place], [place] [address] Tel. [phone]    Fax. [phone]   This document serves as a record of services personally performed by Lurline Del, MD. It was created on his behalf by Wilburn Mylar, a trained medical scribe. The creation of this record is based on the scribe's personal observations and the provider's statements to them.   I, Lurline Del MD, have reviewed the above documentation for accuracy and completeness, and I agree with the above.   *Total Encounter Time as defined by the Centers for Medicare and Medicaid Services includes, in addition to the face-to-face time of a patient visit (documented in the note above) non-face-to-face time: obtaining and reviewing outside history, ordering and reviewing medications, tests or procedures, care coordination (communications with other health care professionals or caregivers) and documentation in the medical record.

## 2020-04-22 ENCOUNTER — Other Ambulatory Visit: Payer: Self-pay

## 2020-04-22 ENCOUNTER — Telehealth: Payer: Self-pay | Admitting: Oncology

## 2020-04-22 ENCOUNTER — Inpatient Hospital Stay: Payer: No Typology Code available for payment source | Attending: Oncology | Admitting: Oncology

## 2020-04-22 VITALS — BP 135/89 | HR 100 | Temp 98.4°F | Resp 18 | Ht 63.0 in | Wt 174.0 lb

## 2020-04-22 DIAGNOSIS — Z170 Estrogen receptor positive status [ER+]: Secondary | ICD-10-CM | POA: Insufficient documentation

## 2020-04-22 DIAGNOSIS — C50511 Malignant neoplasm of lower-outer quadrant of right female breast: Secondary | ICD-10-CM | POA: Insufficient documentation

## 2020-04-22 NOTE — Telephone Encounter (Signed)
Scheduled appointment per 12/8 los. Spoke to patient who is aware of appointments dates and times.

## 2020-04-23 ENCOUNTER — Encounter: Payer: Self-pay | Admitting: *Deleted

## 2020-04-28 ENCOUNTER — Telehealth: Payer: Self-pay | Admitting: Surgical

## 2020-04-28 NOTE — Telephone Encounter (Signed)
Patient called to let us know that she has more fluid build up in one of her breasts and looks more swollen than during her visit with [facility]. She said he was concerned about withdrawing the fluid due to how close it was to the expander and wanted to follow up because it does appear to be more swollen. Advised her that since Catalina Antigua is out of the office, we would check with Dr. Marla Roe. Please call her to advise if this is okay. Her appt is on Friday but since [facility] may have to check with Dr. Marla Roe, she wanted to let us know before her appt. [phone]

## 2020-04-30 ENCOUNTER — Inpatient Hospital Stay: Payer: No Typology Code available for payment source

## 2020-04-30 ENCOUNTER — Other Ambulatory Visit: Payer: Self-pay

## 2020-04-30 VITALS — BP 140/79 | HR 96 | Temp 98.3°F | Resp 18

## 2020-04-30 DIAGNOSIS — C50511 Malignant neoplasm of lower-outer quadrant of right female breast: Secondary | ICD-10-CM

## 2020-04-30 MED ORDER — GOSERELIN ACETATE 3.6 MG ~~LOC~~ IMPL
3.6000 mg | DRUG_IMPLANT | Freq: Once | SUBCUTANEOUS | Status: AC
  Administered 2020-04-30: 3.6 mg via SUBCUTANEOUS

## 2020-04-30 MED ORDER — GOSERELIN ACETATE 3.6 MG ~~LOC~~ IMPL
DRUG_IMPLANT | SUBCUTANEOUS | Status: AC
  Filled 2020-04-30: qty 3.6

## 2020-04-30 NOTE — Patient Instructions (Signed)

## 2020-04-30 NOTE — Telephone Encounter (Signed)
Called and spoke with the patient on (04/28/20) regarding the message below.  Informed the patient that I spoke with Dr. Marla Roe.  She stated that the patient can wait still her appointment on Friday with [facility] or if her breast is tight or gets tight She would like to see her sooner.    Patient stated that her breast does not hurt, and they are not to tight.  She said she will just wait until her appointment on Friday with Matt.//AB/CMA

## 2020-05-01 ENCOUNTER — Telehealth: Payer: Self-pay | Admitting: Surgical

## 2020-05-01 ENCOUNTER — Encounter: Payer: Self-pay | Admitting: Surgical

## 2020-05-01 ENCOUNTER — Other Ambulatory Visit: Payer: Self-pay

## 2020-05-01 ENCOUNTER — Ambulatory Visit (INDEPENDENT_AMBULATORY_CARE_PROVIDER_SITE_OTHER): Payer: No Typology Code available for payment source | Admitting: Surgical

## 2020-05-01 VITALS — BP 130/87 | HR 72 | Temp 98.4°F

## 2020-05-01 DIAGNOSIS — C50511 Malignant neoplasm of lower-outer quadrant of right female breast: Secondary | ICD-10-CM

## 2020-05-01 DIAGNOSIS — Z170 Estrogen receptor positive status [ER+]: Secondary | ICD-10-CM

## 2020-05-01 NOTE — Telephone Encounter (Signed)
Patient would like to know if the plan is to do the lift/reduction on the left and implant on the right side surgery at the same time or if it would be two separate surgeries. Please call to confirm.

## 2020-05-01 NOTE — Progress Notes (Addendum)
Patient is a 49 year old female here for follow-up on her right breast reconstruction with Dr. Marla Roe.  She reports she tolerated last fill fine.  She reports that she has seen oncology since our last visit and they are planning for radiation after she completes the expansion process.  She reports that she continues to notice some fullness/fluid within the lower and lateral aspect of her right breast.  She reports that this is not causing her any pain, but it does cause a weird sensation of fullness.  Chaperone present on exam On exam right breast incision intact.  Mastectomy flaps are viable.  No erythema.  Positive fluid wave inferior lateral.  No dehiscence noted.  We placed injectable saline in the expander using a sterile technique: Right: 70 cc for a total of 480/535 cc.  Patient tolerated fill just fine.  I attempted to aspirate fluid from the right breast using the port site as a safety mechanism, unfortunately the fluid is too inferior and was unable to be aspirated.  I did consult Dr. Marla Roe to see if she was able to aspirate any fluid from the patient's axilla/inferolateral breast -she was able to aspirate 40 cc of serosanguineous fluid from the right breast using a sterile technique and being careful to not puncture the right breast expander.  We will plan to see the patient back in 1 to 2 weeks pending schedule due to the holidays. No sign of infection. Pictures were obtained of the patient and placed in the chart with the patient's or guardian's permission.

## 2020-05-01 NOTE — Telephone Encounter (Signed)
Typically it is at the same time.  She would undergo exchange of the expander for an implant and the lift/reduction on the native breast side at the same time . please call patient to let her know.  Thanks

## 2020-05-04 ENCOUNTER — Encounter: Payer: Self-pay | Admitting: *Deleted

## 2020-05-05 ENCOUNTER — Encounter: Payer: Self-pay | Admitting: Physical Therapy

## 2020-05-05 ENCOUNTER — Ambulatory Visit: Payer: No Typology Code available for payment source | Attending: General Surgery | Admitting: Physical Therapy

## 2020-05-05 ENCOUNTER — Other Ambulatory Visit: Payer: Self-pay

## 2020-05-05 DIAGNOSIS — R600 Localized edema: Secondary | ICD-10-CM | POA: Insufficient documentation

## 2020-05-05 DIAGNOSIS — M25611 Stiffness of right shoulder, not elsewhere classified: Secondary | ICD-10-CM | POA: Insufficient documentation

## 2020-05-05 DIAGNOSIS — R293 Abnormal posture: Secondary | ICD-10-CM | POA: Insufficient documentation

## 2020-05-05 DIAGNOSIS — Z483 Aftercare following surgery for neoplasm: Secondary | ICD-10-CM | POA: Diagnosis present

## 2020-05-05 DIAGNOSIS — D4861 Neoplasm of uncertain behavior of right breast: Secondary | ICD-10-CM | POA: Insufficient documentation

## 2020-05-05 NOTE — Therapy (Signed)
[place], [place], [address] Phone: [phone]   Fax:  [phone]  Physical Therapy Treatment  Patient Details  Name: Christy Ferrell MRN: [id number] Date of Birth: Jul 28, 1970 Referring Provider (PT): Dr. Marlou Starks   Encounter Date: 05/05/2020   PT End of Session - 05/05/20 0957    Visit Number 2    Number of Visits 10    Date for PT Re-Evaluation 06/02/20    PT Start Time 0903    PT Stop Time 0950    PT Time Calculation (min) 47 min    Activity Tolerance Patient tolerated treatment well    Behavior During Therapy [facility] for tasks assessed/performed           Past Medical History:  Diagnosis Date  . Anxiety    h/o, was prev on zoloft  . Breast cancer ([place]) 01/2020   seeing Dr. Marlou Starks  . Cholelithiasis   . Diabetes mellitus without complication ([place])   . Family history of breast cancer   . Family history of leukemia   . Family history of lung cancer   . Family history of prostate cancer   . Family history of stomach cancer   . Hypothyroidism    postpartum hypothyroidism  . IUD (intrauterine device) in place    mirena per [employer]  . Major depression     Past Surgical History:  Procedure Laterality Date  . BREAST RECONSTRUCTION WITH PLACEMENT OF TISSUE EXPANDER AND FLEX HD (ACELLULAR HYDRATED DERMIS) Right 03/30/2020   Procedure: BREAST RECONSTRUCTION WITH PLACEMENT OF TISSUE EXPANDER AND FLEX HD (ACELLULAR HYDRATED DERMIS);  Surgeon: Wallace Going, DO;  Location: [place];  Service: Plastics;  Laterality: Right;  . CESAREAN SECTION     X 2 Fetal stress, second scheduled  . CHOLECYSTECTOMY  05/1998  . MASTECTOMY MODIFIED RADICAL Right 03/30/2020   Procedure: RIGHT MASTECTOMY MODIFIED RADICAL;  Surgeon: Jovita Kussmaul, MD;  Location: [facility];  Service: General;  Laterality: Right;  PEC BLOCK, RNFA    There were no vitals filed for this  visit.   Subjective Assessment - 05/05/20 0906    Subjective When I move my arm a certain way I feel like there is a lot of tightness. I have a raw feeling on the back of my arm. I have trouble opening a jaw and I have decreased ROM. The strength in the arm is not good either.    Pertinent History Rt invasive breast cancer lower inner quadrant and DCIS/ 03/30/20- ER/PR +, HER 2-, ALND (2 of 9 positive), pt will require radiation and no chemo    Patient Stated Goals getting information from providers    Currently in Pain? No/denies    Pain Score 0-No pain              [facility] PT Assessment - 05/05/20 0001      Balance Screen   Has the patient fallen in the past 6 months No    Has the patient had a decrease in activity level because of a fear of falling?  No    Is the patient reluctant to leave their home because of a fear of falling?  No      Home Social worker Private residence    Living Arrangements Alone    Available Help at Discharge Family      Prior Function   Level of Independence Independent    Vocation --  on leave currently   Vocation Requirements computer job    Leisure walking       Cognition   Overall Cognitive Status Within Functional Limits for tasks assessed      Observation/Other Assessments   Observations swelling present inferior to expander, scar healing, cording visible at anterior forearm and in inferior breast.      AROM   Right Shoulder Extension 47 Degrees    Right Shoulder Flexion 141 Degrees    Right Shoulder ABduction 99 Degrees    Right Shoulder Internal Rotation 31 Degrees    Right Shoulder External Rotation 69 Degrees             LYMPHEDEMA/ONCOLOGY QUESTIONNAIRE - 05/05/20 0001      Type   Cancer Type R breast cancer      Surgeries   Mastectomy Date 03/30/20    Axillary Lymph Node Dissection Date 03/30/20    Number Lymph Nodes Removed 9   2 were positive     Treatment   Active Chemotherapy Treatment No     Past Chemotherapy Treatment No    Active Radiation Treatment No   will require   Past Radiation Treatment No    Current Hormone Treatment Yes    Drug Name Tamoxifen    Past Hormone Therapy No      What other symptoms do you have   Are you Having Heaviness or Tightness Yes    Are you having Pain Yes   occasional   Are you having pitting edema No    Is it Hard or Difficult finding clothes that fit No    Do you have infections No    Is there Decreased scar mobility Yes   still healing     Right Upper Extremity Lymphedema   10 cm Proximal to Olecranon Process 30.3 cm    Olecranon Process 26 cm    10 cm Proximal to Ulnar Styloid Process 22.8 cm    Just Proximal to Ulnar Styloid Process 16 cm    Across Hand at [employer] 19.5 cm    At [place] of 2nd Digit 6 cm                      [facility] Adult PT Treatment/Exercise - 05/05/20 0001      Manual Therapy   Manual Therapy Myofascial release;Passive ROM    Myofascial Release gently across cording from axilla to forearm and to cording under right breast    Passive ROM to R shoulder in direction of flexion, abduction and ER with prolonged holds to help stretch cording                  PT Education - 05/05/20 1139    Education Details cording, lymphedema risk reduction practices    Person(s) Educated Patient    Methods Explanation;Handout    Comprehension Verbalized understanding               PT Long Term Goals - 05/05/20 1139      PT LONG TERM GOAL #1   Title Pt will demonstrate 170 degrees of right shoulder flexion to allow her to reach overhead.    Baseline 141    Time 4    Period Weeks    Status New    Target Date 06/02/20      PT LONG TERM GOAL #2   Title Pt will demonstrate 170 degrees of right shoulder abduction to allow pt to reach out  to the side.    Baseline 99    Time 4    Period Weeks    Status New    Target Date 06/02/20      PT LONG TERM GOAL #3   Title Pt will demonstrate a 75%  reduction in swelling inferior to expander to decrease risk of cellulitis.    Time 4    Period Weeks    Status New    Target Date 06/02/20      PT LONG TERM GOAL #4   Title Pt will report no cording in RUE to allow pt to reach overhead without discomfort.    Time 4    Period Weeks    Status New    Target Date 06/02/20      PT LONG TERM GOAL #5   Title Pt will be independent in a home exercise program for continued strengthening and stretching.    Time 4    Period Weeks    Status New    Target Date 06/02/20                 Plan - 05/05/20 0958    Clinical Impression Statement Pt returns to PT following R mastectomy and ALND (2/9 positive) on 03/30/20 for treatment of R breast cancer. Pt will require radiation. She presents with decresed R shoulder ROM and cording from axilla to forearm with cording also present in inferior breast. She has expanders in place currently. She has swelling present in inferior breast and has had the area drained once. She would benefit from skilled PT services to decrease edema, decrease cording, improve R shoulder ROM and strength to allow pt to return to PLOF.    PT Frequency 2x / week    PT Duration 4 weeks    PT Treatment/Interventions ADLs/Self Care Home Management;Therapeutic exercise;Patient/family education;Manual techniques;Manual lymph drainage;Compression bandaging;Orthotic Fit/Training;Scar mobilization;Passive range of motion;Taping;Vasopneumatic Device    PT Next Visit Plan MFR to cording RUE, MLD to swelling to expander, PROM to R shoulder, pulleys and ball    PT Home Exercise Plan post op    Consulted and Agree with Plan of Care Patient           Patient will benefit from skilled therapeutic intervention in order to improve the following deficits and impairments:  Pain,Postural dysfunction,Impaired UE functional use,Increased fascial restricitons,Decreased range of motion,Decreased scar mobility,Increased edema,Decreased knowledge  of precautions  Visit Diagnosis: Stiffness of right shoulder, not elsewhere classified  Localized edema  Aftercare following surgery for neoplasm  Abnormal posture  Neoplasm of uncertain behavior of lower inner quadrant of female breast, right     Problem List Patient Active Problem List   Diagnosis Date Noted  . Family history of prostate cancer   . Family history of breast cancer   . Family history of leukemia   . Family history of lung cancer   . Family history of stomach cancer   . Hepatic steatosis 02/13/2020  . Malignant neoplasm of lower-outer quadrant of right breast of female, estrogen receptor positive ([place]) 01/29/2020  . Diabetes mellitus without complication ([place]) 08/65/7846  . Abnormal thyroid stimulating hormone (TSH) level 09/14/2016  . Hyperglycemia 09/14/2016  . Fever blister 09/14/2016  . Advance care planning 09/14/2016  . Cough 10/06/2014  . Headache(784.0) 06/05/2012  . Shoulder pain 01/18/2012  . Anxiety 02/18/2011  . Routine general medical examination at a health care facility 09/03/2010  . Hypothyroidism 12/14/2006    Allyson Sabal Blue 05/05/2020, 11:43 AM  [facility] [place], [place], [address] Phone: [phone]   Fax:  [phone]  Name: Christy Ferrell MRN: [id number] Date of Birth: [birth year]  Manus Gunning, PT 05/05/20 11:43 AM

## 2020-05-07 ENCOUNTER — Telehealth: Payer: Self-pay | Admitting: Oncology

## 2020-05-07 NOTE — Telephone Encounter (Signed)
Left message to schedule follow-up visit and injection around 2/8 per 12/23 schedule message.

## 2020-05-07 NOTE — Telephone Encounter (Signed)
Scheduled patient for follow-up visit and injection per 12/23 schedule message. Patient is aware.

## 2020-05-12 ENCOUNTER — Ambulatory Visit: Payer: No Typology Code available for payment source

## 2020-05-12 ENCOUNTER — Encounter: Payer: Self-pay | Admitting: Surgical

## 2020-05-12 ENCOUNTER — Ambulatory Visit (INDEPENDENT_AMBULATORY_CARE_PROVIDER_SITE_OTHER): Payer: No Typology Code available for payment source | Admitting: Surgical

## 2020-05-12 ENCOUNTER — Other Ambulatory Visit: Payer: Self-pay

## 2020-05-12 VITALS — BP 121/75 | HR 74 | Temp 98.0°F

## 2020-05-12 DIAGNOSIS — Z483 Aftercare following surgery for neoplasm: Secondary | ICD-10-CM

## 2020-05-12 DIAGNOSIS — C50511 Malignant neoplasm of lower-outer quadrant of right female breast: Secondary | ICD-10-CM

## 2020-05-12 DIAGNOSIS — Z170 Estrogen receptor positive status [ER+]: Secondary | ICD-10-CM

## 2020-05-12 DIAGNOSIS — R293 Abnormal posture: Secondary | ICD-10-CM

## 2020-05-12 DIAGNOSIS — M25611 Stiffness of right shoulder, not elsewhere classified: Secondary | ICD-10-CM

## 2020-05-12 DIAGNOSIS — D4861 Neoplasm of uncertain behavior of right breast: Secondary | ICD-10-CM

## 2020-05-12 DIAGNOSIS — R600 Localized edema: Secondary | ICD-10-CM

## 2020-05-12 NOTE — Patient Instructions (Signed)
Pt given another copy of 4 D post op exs secondary to losing her copy

## 2020-05-12 NOTE — Progress Notes (Signed)
Patient is a 49 year old female here for follow-up on her right breast reconstruction with Dr. Ulice Bold.  At her last visit we will refill the right expander with 70 cc of fluid and we also aspirated 40 cc of serosanguineous fluid from the right breast.  She reports that she saw physical therapy today and they were able to help massage the right breast fluid assist with reabsorption.  She reports that she is doing well.  She reports that she had a good holiday.  She reports that she is little bit sad because of her children going back to college soon.  Chaperone present on exam On exam right breast incision is intact.  Mastectomy flaps are viable.  No erythema noted.  She does have a little bit of a fluid wave along the inferior lateral aspect of her right breast.  No incisional dehiscence noted or wounds noted.   We placed injectable saline in the Expander using a sterile technique: Right: 70 cc for a total of 550/535 cc  Patient reports she tolerated last fill fine, she is close to her desired size on the right side.  We are planning to do a left mastectomy/reduction on the left side to match.  There is no sign of infection.  She does have a small seroma on the right side, this was not attempted to aspirate today as it was not large.  No sign of hematoma.  Patient is scheduled for 05/22/2019 for preop appointment and additional fill pending patient's thoughts on current size.  Will route message to Dr. Ulice Bold so we can order her implants today.

## 2020-05-12 NOTE — Therapy (Signed)
[place], [place], [address] Phone: [phone]   Fax:  [phone]  Physical Therapy Treatment  Patient Details  Name: Christy Ferrell MRN: [id number] Date of Birth: 1970/12/10 Referring Provider (PT): Christy Ferrell   Encounter Date: 05/12/2020   PT End of Session - 05/12/20 0913    Visit Number 3    Number of Visits 10    Date for PT Re-Evaluation 06/02/20    PT Start Time 0803    PT Stop Time 0859    PT Time Calculation (min) 56 min    Activity Tolerance Patient tolerated treatment well    Behavior During Therapy [facility] for tasks assessed/performed           Past Medical History:  Diagnosis Date   Anxiety    h/o, was prev on zoloft   Breast cancer ([place]) 01/2020   seeing Christy Ferrell   Cholelithiasis    Diabetes mellitus without complication [facility])    Family history of breast cancer    Family history of leukemia    Family history of lung cancer    Family history of prostate cancer    Family history of stomach cancer    Hypothyroidism    postpartum hypothyroidism   IUD (intrauterine device) in place    mirena per [facility]   Major depression     Past Surgical History:  Procedure Laterality Date   BREAST RECONSTRUCTION WITH PLACEMENT OF TISSUE EXPANDER AND FLEX HD (ACELLULAR HYDRATED DERMIS) Right 03/30/2020   Procedure: BREAST RECONSTRUCTION WITH PLACEMENT OF TISSUE EXPANDER AND FLEX HD (ACELLULAR HYDRATED DERMIS);  Surgeon: Wallace Going, DO;  Location: [facility];  Service: Plastics;  Laterality: Right;   CESAREAN SECTION     X 2 Fetal stress, second scheduled   CHOLECYSTECTOMY  05/1998   MASTECTOMY MODIFIED RADICAL Right 03/30/2020   Procedure: RIGHT MASTECTOMY MODIFIED RADICAL;  Surgeon: Jovita Kussmaul, MD;  Location: [facility];  Service: General;  Laterality: Right;  PEC BLOCK, RNFA    There were no vitals filed for this  visit.   Subjective Assessment - 05/12/20 0801    Subjective Still get some shooting pains under right breast. Back of arm is still feeling raw but improving as I have done desensitization techniques [name] told me about.  I think I lost my copy of the exs.  Can you give me another?    Pertinent History Rt invasive breast cancer lower inner quadrant and DCIS/ 03/30/20- ER/PR +, HER 2-, ALND (2 of 9 positive), pt will require radiation and no chemo    Currently in Pain? No/denies    Pain Score 0-No pain                             [facility] Adult PT Treatment/Exercise - 05/12/20 0001      Posture/Postural Control   Posture/Postural Control Postural limitations    Postural Limitations Rounded Shoulders;Forward head      Shoulder Exercises: Supine   Other Supine Exercises AA flexion x 5, stargazer x 5      Shoulder Exercises: Pulleys   Flexion 2 minutes    ABduction 2 minutes      Shoulder Exercises: Therapy Ball   Flexion Both;10 reps   yellow ball     Manual Therapy   Manual Therapy Myofascial release;Manual Lymphatic Drainage (MLD);Passive ROM    Myofascial Release gently across cording  from axilla to forearm and to cording under right breast    Manual Lymphatic Drainage (MLD) short neck, right inguinal, right axillo inguinal pathway and right inferior and lateral breast retracing axillo inguinal pathway    Passive ROM to R shoulder in direction of flexion, abduction and ER with prolonged holds to help stretch cording                       PT Long Term Goals - 05/05/20 1139      PT LONG TERM GOAL #1   Title Pt will demonstrate 170 degrees of right shoulder flexion to allow her to reach overhead.    Baseline 141    Time 4    Period Weeks    Status New    Target Date 06/02/20      PT LONG TERM GOAL #2   Title Pt will demonstrate 170 degrees of right shoulder abduction to allow pt to reach out to the side.    Baseline 99    Time 4    Period  Weeks    Status New    Target Date 06/02/20      PT LONG TERM GOAL #3   Title Pt will demonstrate a 75% reduction in swelling inferior to expander to decrease risk of cellulitis.    Time 4    Period Weeks    Status New    Target Date 06/02/20      PT LONG TERM GOAL #4   Title Pt will report no cording in RUE to allow pt to reach overhead without discomfort.    Time 4    Period Weeks    Status New    Target Date 06/02/20      PT LONG TERM GOAL #5   Title Pt will be independent in a home exercise program for continued strengthening and stretching.    Time 4    Period Weeks    Status New    Target Date 06/02/20                 Plan - 05/12/20 0914    Clinical Impression Statement Pt has been compliant with desensitization techniques and posterior arm sensitivity is improving.  She had lost her pics of post op exs and was given another today.  She had good tolerance for ROM but required VC's to relax throughout.  She felt looser after treatment today.  She was advised to get back on a regimen for exercises now that the holidays are over. Swelling is still present inferior and lateral to right breast as is cording.    Stability/Clinical Decision Making Stable/Uncomplicated    Rehab Potential Excellent    PT Frequency 2x / week    PT Duration 4 weeks    PT Treatment/Interventions ADLs/Self Care Home Management;Therapeutic exercise;Patient/family education;Manual techniques;Manual lymph drainage;Compression bandaging;Orthotic Fit/Training;Scar mobilization;Passive range of motion;Taping;Vasopneumatic Device    PT Next Visit Plan MFR to cording RUE, MLD to swelling to expander, PROM to R shoulder, pulleys and ball    PT Home Exercise Plan post op    Consulted and Agree with Plan of Care Patient           Patient will benefit from skilled therapeutic intervention in order to improve the following deficits and impairments:  Pain,Postural dysfunction,Impaired UE functional  use,Increased fascial restricitons,Decreased range of motion,Decreased scar mobility,Increased edema,Decreased knowledge of precautions  Visit Diagnosis: Stiffness of right shoulder, not elsewhere classified  Localized edema  Aftercare following surgery for neoplasm  Abnormal posture  Neoplasm of uncertain behavior of lower inner quadrant of female breast, right     Problem List Patient Active Problem List   Diagnosis Date Noted   Family history of prostate cancer    Family history of breast cancer    Family history of leukemia    Family history of lung cancer    Family history of stomach cancer    Hepatic steatosis 02/13/2020   Malignant neoplasm of lower-outer quadrant of right breast of female, estrogen receptor positive ([facility]) 01/29/2020   Diabetes mellitus without complication ([facility]) 12/04/2019   Abnormal thyroid stimulating hormone (TSH) level 09/14/2016   Hyperglycemia 09/14/2016   Fever blister 09/14/2016   Advance care planning 09/14/2016   Cough 10/06/2014   Headache(784.0) 06/05/2012   Shoulder pain 01/18/2012   Anxiety 02/18/2011   Routine general medical examination at a health care facility 09/03/2010   Hypothyroidism 12/14/2006    Waynette Buttery 05/12/2020, 10:28 AM  [facility] [address] [place], [place], [address] Phone: [phone]   Fax:  [phone]  Name: Christy Ferrell MRN: [id number] Date of Birth: [birth year] Alvira Monday, PT 05/12/20 10:29 AM

## 2020-05-18 ENCOUNTER — Ambulatory Visit: Payer: No Typology Code available for payment source | Admitting: Physical Therapy

## 2020-05-20 ENCOUNTER — Ambulatory Visit: Payer: No Typology Code available for payment source | Admitting: Physical Therapy

## 2020-05-20 ENCOUNTER — Encounter: Payer: Self-pay | Admitting: Physical Therapy

## 2020-05-20 ENCOUNTER — Other Ambulatory Visit: Payer: Self-pay

## 2020-05-20 DIAGNOSIS — Z483 Aftercare following surgery for neoplasm: Secondary | ICD-10-CM | POA: Insufficient documentation

## 2020-05-20 DIAGNOSIS — Z170 Estrogen receptor positive status [ER+]: Secondary | ICD-10-CM | POA: Diagnosis present

## 2020-05-20 DIAGNOSIS — M25611 Stiffness of right shoulder, not elsewhere classified: Secondary | ICD-10-CM

## 2020-05-20 DIAGNOSIS — R293 Abnormal posture: Secondary | ICD-10-CM | POA: Insufficient documentation

## 2020-05-20 DIAGNOSIS — R600 Localized edema: Secondary | ICD-10-CM | POA: Insufficient documentation

## 2020-05-20 DIAGNOSIS — D4861 Neoplasm of uncertain behavior of right breast: Secondary | ICD-10-CM | POA: Insufficient documentation

## 2020-05-20 DIAGNOSIS — C50511 Malignant neoplasm of lower-outer quadrant of right female breast: Secondary | ICD-10-CM | POA: Diagnosis present

## 2020-05-20 NOTE — Therapy (Signed)
[facility] [address] [place], [place], [address] Phone: [phone]   Fax:  [phone]  Physical Therapy Treatment  Patient Details  Name: Christy Ferrell MRN: [id number] Date of Birth: [birth year] Referring Provider (PT): Dr. Carolynne Edouard   Encounter Date: 05/20/2020   PT End of Session - 05/20/20 1459    Visit Number 4    Number of Visits 10    Date for PT Re-Evaluation 06/02/20    PT Start Time 1406    PT Stop Time 1455    PT Time Calculation (min) 49 min    Activity Tolerance Patient tolerated treatment well    Behavior During Therapy [facility] for tasks assessed/performed           Past Medical History:  Diagnosis Date  . Anxiety    h/o, was prev on zoloft  . Breast cancer (HCC) 01/2020   seeing Dr. Carolynne Edouard  . Cholelithiasis   . Diabetes mellitus without complication (HCC)   . Family history of breast cancer   . Family history of leukemia   . Family history of lung cancer   . Family history of prostate cancer   . Family history of stomach cancer   . Hypothyroidism    postpartum hypothyroidism  . IUD (intrauterine device) in place    mirena per [facility]  . Major depression     Past Surgical History:  Procedure Laterality Date  . BREAST RECONSTRUCTION WITH PLACEMENT OF TISSUE EXPANDER AND FLEX HD (ACELLULAR HYDRATED DERMIS) Right 03/30/2020   Procedure: BREAST RECONSTRUCTION WITH PLACEMENT OF TISSUE EXPANDER AND FLEX HD (ACELLULAR HYDRATED DERMIS);  Surgeon: Peggye Form, DO;  Location: [facility];  Service: Plastics;  Laterality: Right;  . CESAREAN SECTION     X 2 Fetal stress, second scheduled  . CHOLECYSTECTOMY  05/1998  . MASTECTOMY MODIFIED RADICAL Right 03/30/2020   Procedure: RIGHT MASTECTOMY MODIFIED RADICAL;  Surgeon: Griselda Miner, MD;  Location: [facility];  Service: General;  Laterality: Right;  PEC BLOCK, RNFA    There were no vitals filed for this  visit.   Subjective Assessment - 05/20/20 1408    Subjective My arm is tight again from the cording. I have been doing the exercises.    Pertinent History Rt invasive breast cancer lower inner quadrant and DCIS/ 03/30/20- ER/PR +, HER 2-, ALND (2 of 9 positive), pt will require radiation and no chemo    Patient Stated Goals getting information from providers    Currently in Pain? No/denies    Pain Score 0-No pain                             [facility] Adult PT Treatment/Exercise - 05/20/20 0001      Shoulder Exercises: Pulleys   Flexion 2 minutes    ABduction 2 minutes      Shoulder Exercises: Therapy Ball   Flexion Both;10 reps   yellow ball   ABduction 10 reps;Right      Manual Therapy   Manual Therapy Myofascial release;Passive ROM;Soft tissue mobilization    Soft tissue mobilization soft tissue mobilization to R serratus in area of trigger points    Myofascial Release gently across cording from axilla to forearm and to cording under right breast    Passive ROM to R shoulder in direction of flexion, abduction and ER with prolonged holds to help stretch cording  PT Long Term Goals - 05/05/20 1139      PT LONG TERM GOAL #1   Title Pt will demonstrate 170 degrees of right shoulder flexion to allow her to reach overhead.    Baseline 141    Time 4    Period Weeks    Status New    Target Date 06/02/20      PT LONG TERM GOAL #2   Title Pt will demonstrate 170 degrees of right shoulder abduction to allow pt to reach out to the side.    Baseline 99    Time 4    Period Weeks    Status New    Target Date 06/02/20      PT LONG TERM GOAL #3   Title Pt will demonstrate a 75% reduction in swelling inferior to expander to decrease risk of cellulitis.    Time 4    Period Weeks    Status New    Target Date 06/02/20      PT LONG TERM GOAL #4   Title Pt will report no cording in RUE to allow pt to reach overhead without discomfort.     Time 4    Period Weeks    Status New    Target Date 06/02/20      PT LONG TERM GOAL #5   Title Pt will be independent in a home exercise program for continued strengthening and stretching.    Time 4    Period Weeks    Status New    Target Date 06/02/20                 Plan - 05/20/20 1500    Clinical Impression Statement Pt still presents with cording down right arm that is causing increased tightness. She is having increased tightness also on right lateral trunk in area of serratus so performed soft tissue mobilization to this area to help improve comfort. Pt is going to have implant placed on 05/28/20. Pt states she is stretching at home to help reduce the cording. Focused mainly on myofascial release to RUE to decrease cording to decrease tightness and improve comfort.    PT Frequency 2x / week    PT Duration 4 weeks    PT Treatment/Interventions ADLs/Self Care Home Management;Therapeutic exercise;Patient/family education;Manual techniques;Manual lymph drainage;Compression bandaging;Orthotic Fit/Training;Scar mobilization;Passive range of motion;Taping;Vasopneumatic Device    PT Next Visit Plan see when pt can return after surgery on 1/13 and change appts accordingly, MFR to cording RUE, MLD to swelling to expander, PROM to R shoulder, pulleys and ball    PT Home Exercise Plan post op    Consulted and Agree with Plan of Care Patient           Patient will benefit from skilled therapeutic intervention in order to improve the following deficits and impairments:  Pain,Postural dysfunction,Impaired UE functional use,Increased fascial restricitons,Decreased range of motion,Decreased scar mobility,Increased edema,Decreased knowledge of precautions  Visit Diagnosis: Stiffness of right shoulder, not elsewhere classified  Aftercare following surgery for neoplasm  Abnormal posture     Problem List Patient Active Problem List   Diagnosis Date Noted  . Family history of  prostate cancer   . Family history of breast cancer   . Family history of leukemia   . Family history of lung cancer   . Family history of stomach cancer   . Hepatic steatosis 02/13/2020  . Malignant neoplasm of lower-outer quadrant of right breast of female, estrogen receptor positive ([place]) 01/29/2020  .  Diabetes mellitus without complication ([place]) [id number]  . Abnormal thyroid stimulating hormone (TSH) level 09/14/2016  . Hyperglycemia 09/14/2016  . Fever blister 09/14/2016  . Advance care planning 09/14/2016  . Cough 10/06/2014  . Headache(784.0) 06/05/2012  . Shoulder pain 01/18/2012  . Anxiety 02/18/2011  . Routine general medical examination at a health care facility 09/03/2010  . Hypothyroidism 12/14/2006    Allyson Sabal [facility] 05/20/2020, 3:03 PM  [place], [place], [address] Phone: [phone]   Fax:  [phone]  Name: Christy Ferrell MRN: [id number] Date of Birth: 1970-05-28  Manus Gunning, PT 05/20/20 3:03 PM

## 2020-05-21 ENCOUNTER — Encounter (HOSPITAL_BASED_OUTPATIENT_CLINIC_OR_DEPARTMENT_OTHER): Payer: Self-pay | Admitting: Plastic Surgery

## 2020-05-21 ENCOUNTER — Other Ambulatory Visit: Payer: Self-pay

## 2020-05-21 ENCOUNTER — Ambulatory Visit (INDEPENDENT_AMBULATORY_CARE_PROVIDER_SITE_OTHER): Payer: No Typology Code available for payment source | Admitting: Surgical

## 2020-05-21 ENCOUNTER — Encounter: Payer: Self-pay | Admitting: Surgical

## 2020-05-21 VITALS — BP 145/87 | HR 73 | Temp 98.5°F | Ht 63.0 in | Wt 175.0 lb

## 2020-05-21 DIAGNOSIS — E119 Type 2 diabetes mellitus without complications: Secondary | ICD-10-CM

## 2020-05-21 DIAGNOSIS — Z170 Estrogen receptor positive status [ER+]: Secondary | ICD-10-CM

## 2020-05-21 DIAGNOSIS — C50511 Malignant neoplasm of lower-outer quadrant of right female breast: Secondary | ICD-10-CM

## 2020-05-21 MED ORDER — FLUCONAZOLE 150 MG PO TABS: 150.0000 mg | ORAL_TABLET | Freq: Once | ORAL | 0 refills | Status: AC

## 2020-05-21 MED ORDER — CEPHALEXIN 500 MG PO CAPS: 500.0000 mg | ORAL_CAPSULE | Freq: Four times a day (QID) | ORAL | 0 refills | Status: AC

## 2020-05-21 MED ORDER — TRAMADOL HCL 50 MG PO TABS
50.0000 mg | ORAL_TABLET | Freq: Three times a day (TID) | ORAL | 0 refills | Status: AC | PRN
Start: 2020-05-21 — End: 2020-05-26

## 2020-05-21 NOTE — Progress Notes (Signed)
Patient ID: Christy Ferrell, female    DOB: 21-Aug-1970, 50 y.o.   MRN: [id number]  Chief Complaint  Patient presents with  . Pre-op Exam      ICD-10-CM   1. Malignant neoplasm of lower-outer quadrant of right breast of female, estrogen receptor positive ([place])  C50.511    Z17.0   2. Diabetes mellitus without complication (HCC)  [id number]      History of Present Illness: Christy Ferrell is a 50 y.o.  female  with a history of right-sided breast cancer.  She presents for preoperative evaluation for upcoming procedure, Removal of right tissue expander and placement of right breast implant, left breast reduction/mastopexy, scheduled for 05/28/2020 with Dr. Marla Roe  The patient has not had problems with anesthesia. No history of DVT/PE.  No family history of DVT/PE.  No family or personal history of bleeding or clotting disorders.  Patient is not currently taking any blood thinners.  No history of CVA/MI.   PMH Significant for: DM 2, most recent A1c 6.6 2 months ago. Has Mirena IUD in place. Patient currently has 550 cc out of 535 cc in her right breast expander.   Past Medical History: Allergies: Allergies  Allergen Reactions  . Codeine Nausea And Vomiting  . Zoloft [Sertraline Hcl]     headaches    Current Medications:  Current Outpatient Medications:  .  acyclovir ointment (ZOVIRAX) 5 %, Apply 1 application topically every 3 (three) hours. As needed., Disp: 5 g, Rfl: 5 .  cephALEXin (KEFLEX) 500 MG capsule, Take 1 capsule (500 mg total) by mouth 4 (four) times daily for 5 days., Disp: 20 capsule, Rfl: 0 .  cholecalciferol (VITAMIN D3) 25 MCG (1000 UNIT) tablet, Take 1,000 Units by mouth daily., Disp: , Rfl:  .  cyclobenzaprine (FLEXERIL) 10 MG tablet, TAKE 1/2 TO 1 TABLET BY MOUTH EVERY DAY AS NEEDED, Disp: 30 tablet, Rfl: 5 .  diazepam (VALIUM) 2 MG tablet, Take 1 tablet (2 mg total) by mouth every 12 (twelve) hours as needed for muscle spasms., Disp: 20 tablet, Rfl: 0 .   escitalopram (LEXAPRO) 10 MG tablet, Take 1 tablet (10 mg total) by mouth in the morning and at bedtime., Disp: 180 tablet, Rfl: 3 .  fluconazole (DIFLUCAN) 150 MG tablet, Take 1 tablet (150 mg total) by mouth once for 1 dose., Disp: 1 tablet, Rfl: 0 .  goserelin (ZOLADEX) 3.6 MG injection, Inject 3.6 mg into the skin every 28 (twenty-eight) days., Disp: , Rfl:  .  ibuprofen (ADVIL,MOTRIN) 200 MG tablet, Take 200 mg by mouth every 6 (six) hours as needed., Disp: , Rfl:  .  levonorgestrel (MIRENA) 20 MCG/24HR IUD, 1 each by Intrauterine route once. 11/05/12, Disp: , Rfl:  .  levothyroxine (SYNTHROID) 25 MCG tablet, Take 1 tablet (25 mcg total) by mouth daily before breakfast., Disp: 90 tablet, Rfl: 3 .  tamoxifen (NOLVADEX) 20 MG tablet, Take 20 mg by mouth daily., Disp: , Rfl:  .  traMADol (ULTRAM) 50 MG tablet, Take 1 tablet (50 mg total) by mouth every 8 (eight) hours as needed for up to 5 days., Disp: 15 tablet, Rfl: 0 .  Turmeric (QC TUMERIC COMPLEX PO), Take by mouth., Disp: , Rfl:  .  valACYclovir (VALTREX) 1000 MG tablet, Take 2 tablets (2,000 mg total) by mouth 2 (two) times daily as needed. For 1 day., Disp: 20 tablet, Rfl: 5  Past Medical Problems: Past Medical History:  Diagnosis Date  . Anxiety  h/o, was prev on zoloft  . Breast cancer (HCC) 01/2020   seeing Dr. Carolynne Edouard  . Cholelithiasis   . Diabetes mellitus without complication (HCC)   . Family history of breast cancer   . Family history of leukemia   . Family history of lung cancer   . Family history of prostate cancer   . Family history of stomach cancer   . Hypothyroidism    postpartum hypothyroidism  . IUD (intrauterine device) in place    mirena per [facility]  . Major depression     Past Surgical History: Past Surgical History:  Procedure Laterality Date  . BREAST RECONSTRUCTION WITH PLACEMENT OF TISSUE EXPANDER AND FLEX HD (ACELLULAR HYDRATED DERMIS) Right 03/30/2020   Procedure: BREAST RECONSTRUCTION WITH  PLACEMENT OF TISSUE EXPANDER AND FLEX HD (ACELLULAR HYDRATED DERMIS);  Surgeon: Peggye Form, DO;  Location: [facility];  Service: Plastics;  Laterality: Right;  . CESAREAN SECTION     X 2 Fetal stress, second scheduled  . CHOLECYSTECTOMY  05/1998  . MASTECTOMY MODIFIED RADICAL Right 03/30/2020   Procedure: RIGHT MASTECTOMY MODIFIED RADICAL;  Surgeon: Griselda Miner, MD;  Location: [facility];  Service: General;  Laterality: Right;  PEC BLOCK, RNFA    Social History: Social History   Socioeconomic History  . Marital status: Divorced    Spouse name: Not on file  . Number of children: Not on file  . Years of education: Not on file  . Highest education level: Not on file  Occupational History  . Occupation: Housewife  Tobacco Use  . Smoking status: Never Smoker  . Smokeless tobacco: Never Used  Vaping Use  . Vaping Use: Never used  Substance and Sexual Activity  . Alcohol use: Yes    Alcohol/week: 0.0 standard drinks    Comment: Very rarely.  . Drug use: No  . Sexual activity: Yes    Birth control/protection: I.U.D.    Comment: Mirena  Other Topics Concern  . Not on file  Social History Narrative   Married 1997, divorced 2017   In relationship as of 2021   2 kids   Social Determinants of Corporate investment banker Strain: Not on file  Food Insecurity: Not on file  Transportation Needs: Not on file  Physical Activity: Not on file  Stress: Not on file  Social Connections: Not on file  Intimate Partner Violence: Not on file    Family History: Family History  Problem Relation Age of Onset  . Hypertension Father   . Depression Father   . Prostate cancer Father 77       Prostate, S/P prostatectomy, metastatic  . Asthma Sister   . Spina bifida Sister   . Hypothyroidism Mother   . Hypertension Maternal Grandmother   . Hyperlipidemia Maternal Grandmother   . Lung disease Maternal Grandmother   . Lung cancer Maternal Grandmother  96  . Hypothyroidism Maternal Grandfather   . Prostate cancer Maternal Grandfather   . Cancer Maternal Grandfather        lung, stomach, and prostate cancers  . Emphysema Paternal Grandmother   . Diabetes Maternal Uncle   . Diabetes Maternal Uncle   . Leukemia Maternal Uncle 70  . Prostate cancer Maternal Uncle        dx in his 60s/70s, metastatic  . Breast cancer Other 45       negative genetic testing; maternal first cousin  . Stroke Neg Hx   . Colon cancer Neg  Hx     Review of Systems: Review of Systems  Constitutional: Negative.   Gastrointestinal: Negative.     Physical Exam: Vital Signs BP (!) 145/87 (BP Location: Left Arm, Patient Position: Sitting, Cuff Size: Normal)   Pulse 73   Temp 98.5 F (36.9 C) (Temporal)   Ht 5\' 3"  (1.6 m)   Wt 175 lb (79.4 kg)   SpO2 98%   BMI 31.00 kg/m  Chaperone present on exam Physical Exam Constitutional:      General: She is not in acute distress.    Appearance: Normal appearance. She is not ill-appearing.  HENT:     Head: Normocephalic and atraumatic.  Cardiovascular:     Rate and Rhythm: Normal rate.  Pulmonary:     Effort: Pulmonary effort is normal.  Chest:    Abdominal:     General: Abdomen is flat.  Musculoskeletal:     Cervical back: Normal range of motion.  Neurological:     General: No focal deficit present.     Mental Status: She is alert and oriented to person, place, and time.  Psychiatric:        Mood and Affect: Mood normal.        Behavior: Behavior normal.    Assessment/Plan: The patient is scheduled for removal of right breast tissue expander, placement of right breast implant, left breast reduction/mastopexy with Dr. Marla Roe.  Risks, benefits, and alternatives of procedure discussed, questions answered and consent obtained.    Smoking Status: Non-smoker; Counseling Given?  N/A Last Mammogram: 01/15/2020; Results: Mass noted in right breast, 2 enlarged lymph nodes, status post right  mastectomy.  Caprini Score: 7, high risk; Risk Factors include: Age, BMI greater than 25, Mirena IUD, breast cancer, and length of planned surgery. Recommendation for mechanical and pharmacological prophylaxis. Encourage early ambulation.   Pictures obtained: 05/12/2020  Post-op Rx sent to pharmacy: Tramadol, Keflex, Diflucan   Patient was provided with the General Surgical Risk consent document and Pain Medication Agreement prior to their appointment.  They had adequate time to read through the risk consent documents and Pain Medication Agreement. We also discussed them in person together during this preop appointment. All of their questions were answered to their satisfaction.  Recommended calling if they have any further questions.  Risk consent form and Pain Medication Agreement to be scanned into patient's chart.  The risk that can be encountered with breast reduction were discussed and include the following but not limited to these:  Breast asymmetry, fluid accumulation, firmness of the breast, inability to breast feed, loss of nipple or areola, skin loss, decrease or no nipple sensation, fat necrosis of the breast tissue, bleeding, infection, healing delay.  There are risks of anesthesia, changes to skin sensation and injury to nerves or blood vessels.  The muscle can be temporarily or permanently injured.  You may have an allergic reaction to tape, suture, glue, blood products which can result in skin discoloration, swelling, pain, skin lesions, poor healing.  Any of these can lead to the need for revisonal surgery or stage procedures.  A reduction has potential to interfere with diagnostic procedures.  Nipple or breast piercing can increase risks of infection.  This procedure is best done when the breast is fully developed.  Changes in the breast will continue to occur over time.  Pregnancy can alter the outcomes of previous breast reduction surgery, weight gain and weigh loss can also effect the  long term appearance.   The risks that can  be encountered with and after placement of a breast implant were discussed and include the following but not limited to these: bleeding, infection, delayed healing, anesthesia risks, skin sensation changes, injury to structures including nerves, blood vessels, and muscles which may be temporary or permanent, allergies to tape, suture materials and glues, blood products, topical preparations or injected agents, skin contour irregularities, skin discoloration and swelling, deep vein thrombosis, cardiac and pulmonary complications, pain, which may persist, fluid accumulation, wrinkling of the skin over the implanmt, changes in nipple or breast sensation, implant leakage or rupture, faulty position of the implant, persistent pain, formation of tight scar tissue around the implant (capsular contracture).   Electronically signed by: Carola Rhine Ines Rebel, PA-C 05/21/2020 1:35 PM

## 2020-05-21 NOTE — H&P (View-Only) (Signed)
Patient ID: Christy Ferrell, female    DOB: 31-Aug-1970, 50 y.o.   MRN: [id number]  Chief Complaint  Patient presents with  . Pre-op Exam      ICD-10-CM   1. Malignant neoplasm of lower-outer quadrant of right breast of female, estrogen receptor positive ([place])  C50.511    Z17.0   2. Diabetes mellitus without complication (HCC)  [id number]      History of Present Illness: Christy Ferrell is a 50 y.o.  female  with a history of right-sided breast cancer.  She presents for preoperative evaluation for upcoming procedure, Removal of right tissue expander and placement of right breast implant, left breast reduction/mastopexy, scheduled for 05/28/2020 with Dr. Marla Roe  The patient has not had problems with anesthesia. No history of DVT/PE.  No family history of DVT/PE.  No family or personal history of bleeding or clotting disorders.  Patient is not currently taking any blood thinners.  No history of CVA/MI.   PMH Significant for: DM 2, most recent A1c 6.6 2 months ago. Has Mirena IUD in place. Patient currently has 550 cc out of 535 cc in her right breast expander.   Past Medical History: Allergies: Allergies  Allergen Reactions  . Codeine Nausea And Vomiting  . Zoloft [Sertraline Hcl]     headaches    Current Medications:  Current Outpatient Medications:  .  acyclovir ointment (ZOVIRAX) 5 %, Apply 1 application topically every 3 (three) hours. As needed., Disp: 5 g, Rfl: 5 .  cephALEXin (KEFLEX) 500 MG capsule, Take 1 capsule (500 mg total) by mouth 4 (four) times daily for 5 days., Disp: 20 capsule, Rfl: 0 .  cholecalciferol (VITAMIN D3) 25 MCG (1000 UNIT) tablet, Take 1,000 Units by mouth daily., Disp: , Rfl:  .  cyclobenzaprine (FLEXERIL) 10 MG tablet, TAKE 1/2 TO 1 TABLET BY MOUTH EVERY DAY AS NEEDED, Disp: 30 tablet, Rfl: 5 .  diazepam (VALIUM) 2 MG tablet, Take 1 tablet (2 mg total) by mouth every 12 (twelve) hours as needed for muscle spasms., Disp: 20 tablet, Rfl: 0 .   escitalopram (LEXAPRO) 10 MG tablet, Take 1 tablet (10 mg total) by mouth in the morning and at bedtime., Disp: 180 tablet, Rfl: 3 .  fluconazole (DIFLUCAN) 150 MG tablet, Take 1 tablet (150 mg total) by mouth once for 1 dose., Disp: 1 tablet, Rfl: 0 .  goserelin (ZOLADEX) 3.6 MG injection, Inject 3.6 mg into the skin every 28 (twenty-eight) days., Disp: , Rfl:  .  ibuprofen (ADVIL,MOTRIN) 200 MG tablet, Take 200 mg by mouth every 6 (six) hours as needed., Disp: , Rfl:  .  levonorgestrel (MIRENA) 20 MCG/24HR IUD, 1 each by Intrauterine route once. 11/05/12, Disp: , Rfl:  .  levothyroxine (SYNTHROID) 25 MCG tablet, Take 1 tablet (25 mcg total) by mouth daily before breakfast., Disp: 90 tablet, Rfl: 3 .  tamoxifen (NOLVADEX) 20 MG tablet, Take 20 mg by mouth daily., Disp: , Rfl:  .  traMADol (ULTRAM) 50 MG tablet, Take 1 tablet (50 mg total) by mouth every 8 (eight) hours as needed for up to 5 days., Disp: 15 tablet, Rfl: 0 .  Turmeric (QC TUMERIC COMPLEX PO), Take by mouth., Disp: , Rfl:  .  valACYclovir (VALTREX) 1000 MG tablet, Take 2 tablets (2,000 mg total) by mouth 2 (two) times daily as needed. For 1 day., Disp: 20 tablet, Rfl: 5  Past Medical Problems: Past Medical History:  Diagnosis Date  . Anxiety  h/o, was prev on zoloft  . Breast cancer (HCC) 01/2020   seeing Dr. Carolynne Edouard  . Cholelithiasis   . Diabetes mellitus without complication (HCC)   . Family history of breast cancer   . Family history of leukemia   . Family history of lung cancer   . Family history of prostate cancer   . Family history of stomach cancer   . Hypothyroidism    postpartum hypothyroidism  . IUD (intrauterine device) in place    mirena per [employer]  . Major depression     Past Surgical History: Past Surgical History:  Procedure Laterality Date  . BREAST RECONSTRUCTION WITH PLACEMENT OF TISSUE EXPANDER AND FLEX HD (ACELLULAR HYDRATED DERMIS) Right 03/30/2020   Procedure: BREAST RECONSTRUCTION WITH  PLACEMENT OF TISSUE EXPANDER AND FLEX HD (ACELLULAR HYDRATED DERMIS);  Surgeon: Peggye Form, DO;  Location: [facility];  Service: Plastics;  Laterality: Right;  . CESAREAN SECTION     X 2 Fetal stress, second scheduled  . CHOLECYSTECTOMY  05/1998  . MASTECTOMY MODIFIED RADICAL Right 03/30/2020   Procedure: RIGHT MASTECTOMY MODIFIED RADICAL;  Surgeon: Griselda Miner, MD;  Location: [facility];  Service: General;  Laterality: Right;  PEC BLOCK, RNFA    Social History: Social History   Socioeconomic History  . Marital status: Divorced    Spouse name: Not on file  . Number of children: Not on file  . Years of education: Not on file  . Highest education level: Not on file  Occupational History  . Occupation: Housewife  Tobacco Use  . Smoking status: Never Smoker  . Smokeless tobacco: Never Used  Vaping Use  . Vaping Use: Never used  Substance and Sexual Activity  . Alcohol use: Yes    Alcohol/week: 0.0 standard drinks    Comment: Very rarely.  . Drug use: No  . Sexual activity: Yes    Birth control/protection: I.U.D.    Comment: Mirena  Other Topics Concern  . Not on file  Social History Narrative   Married 1997, divorced 2017   In relationship as of 2021   2 kids   Social Determinants of Corporate investment banker Strain: Not on file  Food Insecurity: Not on file  Transportation Needs: Not on file  Physical Activity: Not on file  Stress: Not on file  Social Connections: Not on file  Intimate Partner Violence: Not on file    Family History: Family History  Problem Relation Age of Onset  . Hypertension Father   . Depression Father   . Prostate cancer Father 77       Prostate, S/P prostatectomy, metastatic  . Asthma Sister   . Spina bifida Sister   . Hypothyroidism Mother   . Hypertension Maternal Grandmother   . Hyperlipidemia Maternal Grandmother   . Lung disease Maternal Grandmother   . Lung cancer Maternal Grandmother  96  . Hypothyroidism Maternal Grandfather   . Prostate cancer Maternal Grandfather   . Cancer Maternal Grandfather        lung, stomach, and prostate cancers  . Emphysema Paternal Grandmother   . Diabetes Maternal Uncle   . Diabetes Maternal Uncle   . Leukemia Maternal Uncle 70  . Prostate cancer Maternal Uncle        dx in his 60s/70s, metastatic  . Breast cancer Other 45       negative genetic testing; maternal first cousin  . Stroke Neg Hx   . Colon cancer Neg  Hx     Review of Systems: Review of Systems  Constitutional: Negative.   Gastrointestinal: Negative.     Physical Exam: Vital Signs BP (!) 145/87 (BP Location: Left Arm, Patient Position: Sitting, Cuff Size: Normal)   Pulse 73   Temp 98.5 F (36.9 C) (Temporal)   Ht 5\' 3"  (1.6 m)   Wt 175 lb (79.4 kg)   SpO2 98%   BMI 31.00 kg/m  Chaperone present on exam Physical Exam Constitutional:      General: She is not in acute distress.    Appearance: Normal appearance. She is not ill-appearing.  HENT:     Head: Normocephalic and atraumatic.  Cardiovascular:     Rate and Rhythm: Normal rate.  Pulmonary:     Effort: Pulmonary effort is normal.  Chest:    Abdominal:     General: Abdomen is flat.  Musculoskeletal:     Cervical back: Normal range of motion.  Neurological:     General: No focal deficit present.     Mental Status: She is alert and oriented to person, place, and time.  Psychiatric:        Mood and Affect: Mood normal.        Behavior: Behavior normal.    Assessment/Plan: The patient is scheduled for removal of right breast tissue expander, placement of right breast implant, left breast reduction/mastopexy with Dr. Marla Roe.  Risks, benefits, and alternatives of procedure discussed, questions answered and consent obtained.    Smoking Status: Non-smoker; Counseling Given?  N/A Last Mammogram: 01/15/2020; Results: Mass noted in right breast, 2 enlarged lymph nodes, status post right  mastectomy.  Caprini Score: 7, high risk; Risk Factors include: Age, BMI greater than 25, Mirena IUD, breast cancer, and length of planned surgery. Recommendation for mechanical and pharmacological prophylaxis. Encourage early ambulation.   Pictures obtained: 05/12/2020  Post-op Rx sent to pharmacy: Tramadol, Keflex, Diflucan   Patient was provided with the General Surgical Risk consent document and Pain Medication Agreement prior to their appointment.  They had adequate time to read through the risk consent documents and Pain Medication Agreement. We also discussed them in person together during this preop appointment. All of their questions were answered to their satisfaction.  Recommended calling if they have any further questions.  Risk consent form and Pain Medication Agreement to be scanned into patient's chart.  The risk that can be encountered with breast reduction were discussed and include the following but not limited to these:  Breast asymmetry, fluid accumulation, firmness of the breast, inability to breast feed, loss of nipple or areola, skin loss, decrease or no nipple sensation, fat necrosis of the breast tissue, bleeding, infection, healing delay.  There are risks of anesthesia, changes to skin sensation and injury to nerves or blood vessels.  The muscle can be temporarily or permanently injured.  You may have an allergic reaction to tape, suture, glue, blood products which can result in skin discoloration, swelling, pain, skin lesions, poor healing.  Any of these can lead to the need for revisonal surgery or stage procedures.  A reduction has potential to interfere with diagnostic procedures.  Nipple or breast piercing can increase risks of infection.  This procedure is best done when the breast is fully developed.  Changes in the breast will continue to occur over time.  Pregnancy can alter the outcomes of previous breast reduction surgery, weight gain and weigh loss can also effect the  long term appearance.   The risks that can  be encountered with and after placement of a breast implant were discussed and include the following but not limited to these: bleeding, infection, delayed healing, anesthesia risks, skin sensation changes, injury to structures including nerves, blood vessels, and muscles which may be temporary or permanent, allergies to tape, suture materials and glues, blood products, topical preparations or injected agents, skin contour irregularities, skin discoloration and swelling, deep vein thrombosis, cardiac and pulmonary complications, pain, which may persist, fluid accumulation, wrinkling of the skin over the implanmt, changes in nipple or breast sensation, implant leakage or rupture, faulty position of the implant, persistent pain, formation of tight scar tissue around the implant (capsular contracture).   Electronically signed by: Carola Rhine Amir Glaus, PA-C 05/21/2020 1:35 PM

## 2020-05-22 ENCOUNTER — Encounter (HOSPITAL_BASED_OUTPATIENT_CLINIC_OR_DEPARTMENT_OTHER): Payer: Self-pay | Admitting: Plastic Surgery

## 2020-05-22 ENCOUNTER — Other Ambulatory Visit: Payer: Self-pay

## 2020-05-25 ENCOUNTER — Ambulatory Visit: Payer: No Typology Code available for payment source | Admitting: Physical Therapy

## 2020-05-25 ENCOUNTER — Encounter: Payer: Self-pay | Admitting: Physical Therapy

## 2020-05-25 ENCOUNTER — Encounter: Payer: Self-pay | Admitting: *Deleted

## 2020-05-25 ENCOUNTER — Other Ambulatory Visit: Payer: Self-pay

## 2020-05-25 DIAGNOSIS — Z170 Estrogen receptor positive status [ER+]: Secondary | ICD-10-CM

## 2020-05-25 DIAGNOSIS — R293 Abnormal posture: Secondary | ICD-10-CM

## 2020-05-25 DIAGNOSIS — C50511 Malignant neoplasm of lower-outer quadrant of right female breast: Secondary | ICD-10-CM

## 2020-05-25 DIAGNOSIS — M25611 Stiffness of right shoulder, not elsewhere classified: Secondary | ICD-10-CM

## 2020-05-25 DIAGNOSIS — Z483 Aftercare following surgery for neoplasm: Secondary | ICD-10-CM

## 2020-05-25 NOTE — Therapy (Signed)
[place], [place], [address] Phone: [phone]   Fax:  [phone]  Physical Therapy Treatment  Patient Details  Name: Christy Ferrell MRN: [id number] Date of Birth: 1970/08/15 Referring Provider (PT): Dr. Marlou Starks   Encounter Date: 05/25/2020   PT End of Session - 05/25/20 1457    Visit Number 5    Number of Visits 10    Date for PT Re-Evaluation 06/02/20    PT Start Time 1400    PT Stop Time 1445    PT Time Calculation (min) 45 min    Activity Tolerance Patient tolerated treatment well    Behavior During Therapy [facility] for tasks assessed/performed           Past Medical History:  Diagnosis Date  . Anxiety    h/o, was prev on zoloft  . Breast cancer ([place]) 01/2020   seeing Dr. Marlou Starks  . Cholelithiasis   . Diabetes mellitus without complication ([place])   . Family history of breast cancer   . Family history of leukemia   . Family history of lung cancer   . Family history of prostate cancer   . Family history of stomach cancer   . Hypothyroidism    postpartum hypothyroidism  . IUD (intrauterine device) in place    mirena per [employer]  . Major depression     Past Surgical History:  Procedure Laterality Date  . BREAST RECONSTRUCTION WITH PLACEMENT OF TISSUE EXPANDER AND FLEX HD (ACELLULAR HYDRATED DERMIS) Right 03/30/2020   Procedure: BREAST RECONSTRUCTION WITH PLACEMENT OF TISSUE EXPANDER AND FLEX HD (ACELLULAR HYDRATED DERMIS);  Surgeon: Wallace Going, DO;  [facility];  Service: Plastics;  Laterality: Right;  . CESAREAN SECTION     X 2 Fetal stress, second scheduled  . CHOLECYSTECTOMY  05/1998  . MASTECTOMY MODIFIED RADICAL Right 03/30/2020   Procedure: RIGHT MASTECTOMY MODIFIED RADICAL;  Surgeon: Jovita Kussmaul, MD;  Location: [facility];  Service: General;  Laterality: Right;  PEC BLOCK, RNFA    There were no vitals filed for this  visit.   Subjective Assessment - 05/25/20 1455    Subjective Pt is having her surgery this week. She said she asked about coming back to PT to work with her cording and she already has appointments for that    Pertinent History Rt invasive breast cancer lower inner quadrant and DCIS/ 03/30/20- ER/PR +, HER 2-, ALND (2 of 9 positive), pt will require radiation and no chemo    Currently in Pain? No/denies   its just tight                            [facility] Adult PT Treatment/Exercise - 05/25/20 0001      Manual Therapy   Manual Therapy Manual Lymphatic Drainage (MLD);Soft tissue mobilization;Myofascial release;Passive ROM    Manual therapy comments treatment performed in supine and sidelying with stretch to right shoulder and trunk    Soft tissue mobilization with coconut oil to serratus anterior and back of right shoulder    Myofascial Release gently across cording from axilla to forearm and to cording under right breast    Manual Lymphatic Drainage (MLD) briefly to right arm    Passive ROM to R shoulder in direction of flexion, abduction and ER with prolonged holds to help stretch cording  PT Long Term Goals - 05/05/20 1139      PT LONG TERM GOAL #1   Title Pt will demonstrate 170 degrees of right shoulder flexion to allow her to reach overhead.    Baseline 141    Time 4    Period Weeks    Status New    Target Date 06/02/20      PT LONG TERM GOAL #2   Title Pt will demonstrate 170 degrees of right shoulder abduction to allow pt to reach out to the side.    Baseline 99    Time 4    Period Weeks    Status New    Target Date 06/02/20      PT LONG TERM GOAL #3   Title Pt will demonstrate a 75% reduction in swelling inferior to expander to decrease risk of cellulitis.    Time 4    Period Weeks    Status New    Target Date 06/02/20      PT LONG TERM GOAL #4   Title Pt will report no cording in RUE to allow pt to reach overhead  without discomfort.    Time 4    Period Weeks    Status New    Target Date 06/02/20      PT LONG TERM GOAL #5   Title Pt will be independent in a home exercise program for continued strengthening and stretching.    Time 4    Period Weeks    Status New    Target Date 06/02/20                 Plan - 05/25/20 1457    Clinical Impression Statement Pt presents with  tightness in right axilla, back of right shoulder and down into trunk.  She reports relief with soft tissue work and palpable relief perceived from trigger points around scapula.  Pt got the clear from plastic surgeon to come back after surgery for soft tissue work and has appointments set up.  Will need reassessment next session    PT Frequency 2x / week    PT Duration 4 weeks    PT Treatment/Interventions ADLs/Self Care Home Management;Therapeutic exercise;Patient/family education;Manual techniques;Manual lymph drainage;Compression bandaging;Orthotic Fit/Training;Scar mobilization;Passive range of motion;Taping;Vasopneumatic Device    PT Next Visit Plan see when pt can return after surgery on 1/13 and change appts accordingly, MFR to cording RUE, MLD to swelling to expander, PROM to R shoulder, pulleys and ball           Patient will benefit from skilled therapeutic intervention in order to improve the following deficits and impairments:  Pain,Postural dysfunction,Impaired UE functional use,Increased fascial restricitons,Decreased range of motion,Decreased scar mobility,Increased edema,Decreased knowledge of precautions  Visit Diagnosis: Stiffness of right shoulder, not elsewhere classified  Aftercare following surgery for neoplasm  Abnormal posture     Problem List Patient Active Problem List   Diagnosis Date Noted  . Family history of prostate cancer   . Family history of breast cancer   . Family history of leukemia   . Family history of lung cancer   . Family history of stomach cancer   . Hepatic  steatosis 02/13/2020  . Malignant neoplasm of lower-outer quadrant of right breast of female, estrogen receptor positive ([place]) 01/29/2020  . Diabetes mellitus without complication ([place]) 20/25/4270  . Abnormal thyroid stimulating hormone (TSH) level 09/14/2016  . Hyperglycemia 09/14/2016  . Fever blister 09/14/2016  . Advance care planning 09/14/2016  . Cough 10/06/2014  .  Headache(784.0) 06/05/2012  . Shoulder pain 01/18/2012  . Anxiety 02/18/2011  . Routine general medical examination at a health care facility 09/03/2010  . Hypothyroidism 12/14/2006   Donato Heinz. Owens Shark PT  Norwood Levo 05/25/2020, 3:02 PM  [place], [place], [address] Phone: [phone]   Fax:  [phone]  Name: Christy Ferrell MRN: [id number] Date of Birth: 18-Oct-1970

## 2020-05-26 ENCOUNTER — Other Ambulatory Visit (HOSPITAL_COMMUNITY)
Admission: RE | Admit: 2020-05-26 | Discharge: 2020-05-26 | Disposition: A | Discharge status: Home / Self Care | Payer: No Typology Code available for payment source | Source: Ambulatory Visit | Attending: Plastic Surgery | Admitting: Plastic Surgery

## 2020-05-26 DIAGNOSIS — Z01812 Encounter for preprocedural laboratory examination: Secondary | ICD-10-CM | POA: Diagnosis present

## 2020-05-26 DIAGNOSIS — Z20822 Contact with and (suspected) exposure to covid-19: Secondary | ICD-10-CM | POA: Insufficient documentation

## 2020-05-26 LAB — SARS CORONAVIRUS 2 (TAT 6-24 HRS): SARS Coronavirus 2: NEGATIVE

## 2020-05-27 ENCOUNTER — Ambulatory Visit: Payer: No Typology Code available for payment source

## 2020-05-27 ENCOUNTER — Inpatient Hospital Stay: Payer: No Typology Code available for payment source | Attending: Oncology

## 2020-05-27 ENCOUNTER — Other Ambulatory Visit: Payer: Self-pay

## 2020-05-27 VITALS — BP 132/78 | HR 77 | Resp 18

## 2020-05-27 DIAGNOSIS — R600 Localized edema: Secondary | ICD-10-CM | POA: Insufficient documentation

## 2020-05-27 DIAGNOSIS — Z483 Aftercare following surgery for neoplasm: Secondary | ICD-10-CM

## 2020-05-27 DIAGNOSIS — C50511 Malignant neoplasm of lower-outer quadrant of right female breast: Secondary | ICD-10-CM | POA: Insufficient documentation

## 2020-05-27 DIAGNOSIS — R293 Abnormal posture: Secondary | ICD-10-CM

## 2020-05-27 DIAGNOSIS — M25611 Stiffness of right shoulder, not elsewhere classified: Secondary | ICD-10-CM | POA: Insufficient documentation

## 2020-05-27 DIAGNOSIS — Z170 Estrogen receptor positive status [ER+]: Secondary | ICD-10-CM | POA: Insufficient documentation

## 2020-05-27 DIAGNOSIS — D4861 Neoplasm of uncertain behavior of right breast: Secondary | ICD-10-CM

## 2020-05-27 MED ORDER — GOSERELIN ACETATE 3.6 MG ~~LOC~~ IMPL
3.6000 mg | DRUG_IMPLANT | Freq: Once | SUBCUTANEOUS | Status: AC
  Administered 2020-05-27: 3.6 mg via SUBCUTANEOUS

## 2020-05-27 MED ORDER — GOSERELIN ACETATE 3.6 MG ~~LOC~~ IMPL
DRUG_IMPLANT | SUBCUTANEOUS | Status: AC
  Filled 2020-05-27: qty 3.6

## 2020-05-27 NOTE — Patient Instructions (Signed)

## 2020-05-27 NOTE — Therapy (Signed)
[facility] [address] [place], [place], [address] Phone: [phone]   Fax:  [phone]  Physical Therapy Treatment  Patient Details  Name: Christy Ferrell MRN: [id number] Date of Birth: [birth year] Referring Provider (PT): Dr. Carolynne Edouard   Encounter Date: 05/27/2020   PT End of Session - 05/27/20 1505    Visit Number 6    Number of Visits 10    Date for PT Re-Evaluation 06/02/20    PT Start Time 1403    PT Stop Time 1456    PT Time Calculation (min) 53 min    Activity Tolerance Patient tolerated treatment well    Behavior During Therapy [facility] for tasks assessed/performed           Past Medical History:  Diagnosis Date  . Anxiety    h/o, was prev on zoloft  . Breast cancer (HCC) 01/2020   seeing Dr. Carolynne Edouard  . Cholelithiasis   . Diabetes mellitus without complication (HCC)   . Family history of breast cancer   . Family history of leukemia   . Family history of lung cancer   . Family history of prostate cancer   . Family history of stomach cancer   . Hypothyroidism    postpartum hypothyroidism  . IUD (intrauterine device) in place    mirena per [employer]  . Major depression     Past Surgical History:  Procedure Laterality Date  . BREAST RECONSTRUCTION WITH PLACEMENT OF TISSUE EXPANDER AND FLEX HD (ACELLULAR HYDRATED DERMIS) Right 03/30/2020   Procedure: BREAST RECONSTRUCTION WITH PLACEMENT OF TISSUE EXPANDER AND FLEX HD (ACELLULAR HYDRATED DERMIS);  Surgeon: Peggye Form, DO;  Location: [facility];  Service: Plastics;  Laterality: Right;  . CESAREAN SECTION     X 2 Fetal stress, second scheduled  . CHOLECYSTECTOMY  05/1998  . MASTECTOMY MODIFIED RADICAL Right 03/30/2020   Procedure: RIGHT MASTECTOMY MODIFIED RADICAL;  Surgeon: Griselda Miner, MD;  Location: [facility];  Service: General;  Laterality: Right;  PEC BLOCK, RNFA    There were no vitals filed for this  visit.   Subjective Assessment - 05/27/20 1403    Subjective Having surgery tomorrow and I'm excited.  Still having the cording at axilla and breast.  STW last time felt good.  Surgeon said I can do MFR and STW after surgery but ROM will be limited for several weeks after surgery.    Pertinent History Rt invasive breast cancer lower inner quadrant and DCIS/ 03/30/20- ER/PR +, HER 2-, ALND (2 of 9 positive), pt will require radiation and no chemo    Patient Stated Goals getting information from providers    Currently in Pain? No/denies    Pain Score 0-No pain                             [facility] Adult PT Treatment/Exercise - 05/27/20 0001      Manual Therapy   Manual Therapy Manual Lymphatic Drainage (MLD);Soft tissue mobilization;Myofascial release;Passive ROM    Soft tissue mobilization with coconut oil to serratus anterior and back of right shoulder/scapular area in SL    Myofascial Release gently across cording from axilla to forearm and to cording under right breast    Manual Lymphatic Drainage (MLD) short neck, 5 breaths, right inguinal LN, Right axillo-inguinal pathway and right lateral trunk in supine and SL.    Passive ROM to R shoulder in direction of  flexion, abduction and ER with prolonged holds to help stretch cording                       PT Long Term Goals - 05/05/20 1139      PT LONG TERM GOAL #1   Title Pt will demonstrate 170 degrees of right shoulder flexion to allow her to reach overhead.    Baseline 141    Time 4    Period Weeks    Status New    Target Date 06/02/20      PT LONG TERM GOAL #2   Title Pt will demonstrate 170 degrees of right shoulder abduction to allow pt to reach out to the side.    Baseline 99    Time 4    Period Weeks    Status New    Target Date 06/02/20      PT LONG TERM GOAL #3   Title Pt will demonstrate a 75% reduction in swelling inferior to expander to decrease risk of cellulitis.    Time 4    Period  Weeks    Status New    Target Date 06/02/20      PT LONG TERM GOAL #4   Title Pt will report no cording in RUE to allow pt to reach overhead without discomfort.    Time 4    Period Weeks    Status New    Target Date 06/02/20      PT LONG TERM GOAL #5   Title Pt will be independent in a home exercise program for continued strengthening and stretching.    Time 4    Period Weeks    Status New    Target Date 06/02/20                 Plan - 05/27/20 1506    Clinical Impression Statement Therapy consisted of MFR to cording at axilla and forearm and inferior to right breast, STW done in SL to serratus and around posterior shoulder/scapula. PROM with prolonged holds and MLD to Right lateral trunk.  ROM continues to improve and pt has less pulling with cording.  Swelling at lateral trunk felt much better after MLD.  She is having tissue expanders removed tomorrow and will return for therapy next week for STW/MFR ro areas of cording, MLD.  PROM will be limited for several weeks per pt convresation with MD.    Stability/Clinical Decision Making Stable/Uncomplicated    Rehab Potential Excellent    PT Frequency 2x / week    PT Duration 4 weeks    PT Treatment/Interventions ADLs/Self Care Home Management;Therapeutic exercise;Patient/family education;Manual techniques;Manual lymph drainage;Compression bandaging;Orthotic Fit/Training;Scar mobilization;Passive range of motion;Taping;Vasopneumatic Device    PT Next Visit Plan pt can return for PT next week for MFR/STW, MLD but no PROM for several weeks    Consulted and Agree with Plan of Care Patient           Patient will benefit from skilled therapeutic intervention in order to improve the following deficits and impairments:  Pain,Postural dysfunction,Impaired UE functional use,Increased fascial restricitons,Decreased range of motion,Decreased scar mobility,Increased edema,Decreased knowledge of precautions  Visit Diagnosis: Stiffness of  right shoulder, not elsewhere classified  Aftercare following surgery for neoplasm  Abnormal posture  Localized edema  Neoplasm of uncertain behavior of lower inner quadrant of female breast, right     Problem List Patient Active Problem List   Diagnosis Date Noted  . Family history of prostate  cancer   . Family history of breast cancer   . Family history of leukemia   . Family history of lung cancer   . Family history of stomach cancer   . Hepatic steatosis 02/13/2020  . Malignant neoplasm of lower-outer quadrant of right breast of female, estrogen receptor positive ([place]) 01/29/2020  . Diabetes mellitus without complication ([place]) 16/02/9603  . Abnormal thyroid stimulating hormone (TSH) level 09/14/2016  . Hyperglycemia 09/14/2016  . Fever blister 09/14/2016  . Advance care planning 09/14/2016  . Cough 10/06/2014  . Headache(784.0) 06/05/2012  . Shoulder pain 01/18/2012  . Anxiety 02/18/2011  . Routine general medical examination at a health care facility 09/03/2010  . Hypothyroidism 12/14/2006    Claris Pong 05/27/2020, 3:11 PM  [place], [place], [address] Phone: [phone]   Fax:  [phone]  Name: LUDELLA PRANGER MRN: [id number] Date of Birth: 1970/12/02 Cheral Almas, PT 05/27/20 3:15 PM

## 2020-05-27 NOTE — Anesthesia Preprocedure Evaluation (Signed)
Anesthesia Evaluation  Patient identified by MRN, date of birth, ID band Patient awake    Reviewed: Allergy & Precautions, NPO status , Patient's Chart, lab work & pertinent test results  History of Anesthesia Complications Negative for: history of anesthetic complications  Airway Mallampati: II  TM Distance: >3 FB Neck ROM: Full    Dental no notable dental hx.    Pulmonary neg pulmonary ROS,    Pulmonary exam normal        Cardiovascular negative cardio ROS Normal cardiovascular exam     Neuro/Psych  Headaches, Anxiety Depression    GI/Hepatic negative GI ROS, Neg liver ROS,   Endo/Other  diabetes, Type 2Hypothyroidism   Renal/GU negative Renal ROS  negative genitourinary   Musculoskeletal negative musculoskeletal ROS (+)   Abdominal   Peds  Hematology negative hematology ROS (+)   Anesthesia Other Findings Day of surgery medications reviewed with patient.  Reproductive/Obstetrics negative OB ROS                            Anesthesia Physical Anesthesia Plan  ASA: II  Anesthesia Plan: General   Post-op Pain Management:    Induction: Intravenous  PONV Risk Score and Plan: 3 and Treatment may vary due to age or medical condition, Midazolam, Ondansetron and Dexamethasone  Airway Management Planned: Oral ETT  Additional Equipment: None  Intra-op Plan:   Post-operative Plan: Extubation in OR  Informed Consent: I have reviewed the patients History and Physical, chart, labs and discussed the procedure including the risks, benefits and alternatives for the proposed anesthesia with the patient or authorized representative who has indicated his/her understanding and acceptance.     Dental advisory given  Plan Discussed with: CRNA  Anesthesia Plan Comments:        Anesthesia Quick Evaluation

## 2020-05-28 ENCOUNTER — Other Ambulatory Visit: Payer: Self-pay

## 2020-05-28 ENCOUNTER — Ambulatory Visit (HOSPITAL_BASED_OUTPATIENT_CLINIC_OR_DEPARTMENT_OTHER)
Admission: RE | Admit: 2020-05-28 | Discharge: 2020-05-28 | Disposition: A | Discharge status: Home / Self Care | Payer: No Typology Code available for payment source | Source: Ambulatory Visit | Attending: Plastic Surgery | Admitting: Plastic Surgery

## 2020-05-28 ENCOUNTER — Ambulatory Visit (HOSPITAL_BASED_OUTPATIENT_CLINIC_OR_DEPARTMENT_OTHER): Payer: No Typology Code available for payment source | Admitting: Anesthesiology

## 2020-05-28 ENCOUNTER — Encounter (HOSPITAL_BASED_OUTPATIENT_CLINIC_OR_DEPARTMENT_OTHER)
Admission: RE | Disposition: A | Discharge status: Home / Self Care | Payer: Self-pay | Source: Ambulatory Visit | Attending: Plastic Surgery

## 2020-05-28 ENCOUNTER — Ambulatory Visit: Payer: No Typology Code available for payment source | Admitting: Oncology

## 2020-05-28 ENCOUNTER — Encounter (HOSPITAL_BASED_OUTPATIENT_CLINIC_OR_DEPARTMENT_OTHER): Payer: Self-pay | Admitting: Plastic Surgery

## 2020-05-28 DIAGNOSIS — Z421 Encounter for breast reconstruction following mastectomy: Secondary | ICD-10-CM

## 2020-05-28 DIAGNOSIS — E119 Type 2 diabetes mellitus without complications: Secondary | ICD-10-CM | POA: Diagnosis not present

## 2020-05-28 DIAGNOSIS — N6489 Other specified disorders of breast: Secondary | ICD-10-CM | POA: Insufficient documentation

## 2020-05-28 DIAGNOSIS — Z45811 Encounter for adjustment or removal of right breast implant: Secondary | ICD-10-CM

## 2020-05-28 DIAGNOSIS — Z79890 Hormone replacement therapy (postmenopausal): Secondary | ICD-10-CM | POA: Diagnosis not present

## 2020-05-28 DIAGNOSIS — Z79899 Other long term (current) drug therapy: Secondary | ICD-10-CM | POA: Diagnosis not present

## 2020-05-28 DIAGNOSIS — C50511 Malignant neoplasm of lower-outer quadrant of right female breast: Secondary | ICD-10-CM | POA: Insufficient documentation

## 2020-05-28 DIAGNOSIS — Z793 Long term (current) use of hormonal contraceptives: Secondary | ICD-10-CM | POA: Insufficient documentation

## 2020-05-28 DIAGNOSIS — Z9011 Acquired absence of right breast and nipple: Secondary | ICD-10-CM | POA: Diagnosis not present

## 2020-05-28 DIAGNOSIS — Z853 Personal history of malignant neoplasm of breast: Secondary | ICD-10-CM

## 2020-05-28 DIAGNOSIS — N651 Disproportion of reconstructed breast: Secondary | ICD-10-CM

## 2020-05-28 HISTORY — PX: BREAST REDUCTION WITH MASTOPEXY: SHX6465

## 2020-05-28 HISTORY — PX: REMOVAL OF TISSUE EXPANDER AND PLACEMENT OF IMPLANT: SHX6457

## 2020-05-28 LAB — POCT PREGNANCY, URINE: Preg Test, Ur: NEGATIVE

## 2020-05-28 SURGERY — REMOVAL, TISSUE EXPANDER, BREAST, WITH IMPLANT INSERTION
Anesthesia: General | Site: Breast | Laterality: Right

## 2020-05-28 MED ORDER — FENTANYL CITRATE (PF) 100 MCG/2ML IJ SOLN
INTRAMUSCULAR | Status: AC
  Filled 2020-05-28: qty 2

## 2020-05-28 MED ORDER — EPHEDRINE SULFATE 50 MG/ML IJ SOLN
INTRAMUSCULAR | Status: DC | PRN
  Administered 2020-05-28 (×4): 10 mg via INTRAVENOUS

## 2020-05-28 MED ORDER — LACTATED RINGERS IV SOLN: INTRAVENOUS | Status: DC

## 2020-05-28 MED ORDER — ONDANSETRON HCL 4 MG/2ML IJ SOLN
INTRAMUSCULAR | Status: AC
  Filled 2020-05-28: qty 2

## 2020-05-28 MED ORDER — OXYCODONE HCL 5 MG/5ML PO SOLN: 5.0000 mg | Freq: Once | ORAL | Status: DC | PRN

## 2020-05-28 MED ORDER — PHENYLEPHRINE HCL (PRESSORS) 10 MG/ML IV SOLN
INTRAVENOUS | Status: AC
  Filled 2020-05-28: qty 1

## 2020-05-28 MED ORDER — SUGAMMADEX SODIUM 200 MG/2ML IV SOLN
INTRAVENOUS | Status: DC | PRN
  Administered 2020-05-28: 170 mg via INTRAVENOUS

## 2020-05-28 MED ORDER — CEFAZOLIN SODIUM-DEXTROSE 2-4 GM/100ML-% IV SOLN
INTRAVENOUS | Status: AC
  Filled 2020-05-28: qty 100

## 2020-05-28 MED ORDER — SODIUM CHLORIDE 0.9 % IV SOLN
INTRAVENOUS | Status: AC
  Filled 2020-05-28: qty 10

## 2020-05-28 MED ORDER — SODIUM CHLORIDE 0.9% FLUSH: 3.0000 mL | Freq: Two times a day (BID) | INTRAVENOUS | Status: DC

## 2020-05-28 MED ORDER — CEFAZOLIN SODIUM 1 G IJ SOLR
INTRAMUSCULAR | Status: AC
  Filled 2020-05-28: qty 10

## 2020-05-28 MED ORDER — MIDAZOLAM HCL 5 MG/5ML IJ SOLN
INTRAMUSCULAR | Status: DC | PRN
  Administered 2020-05-28: 2 mg via INTRAVENOUS

## 2020-05-28 MED ORDER — DEXAMETHASONE SODIUM PHOSPHATE 10 MG/ML IJ SOLN
INTRAMUSCULAR | Status: DC | PRN
  Administered 2020-05-28: 5 mg via INTRAVENOUS

## 2020-05-28 MED ORDER — FENTANYL CITRATE (PF) 100 MCG/2ML IJ SOLN
INTRAMUSCULAR | Status: DC | PRN
  Administered 2020-05-28: 50 ug via INTRAVENOUS
  Administered 2020-05-28: 100 ug via INTRAVENOUS
  Administered 2020-05-28: 50 ug via INTRAVENOUS

## 2020-05-28 MED ORDER — KETOROLAC TROMETHAMINE 30 MG/ML IJ SOLN
INTRAMUSCULAR | Status: AC
  Filled 2020-05-28: qty 1

## 2020-05-28 MED ORDER — SODIUM CHLORIDE 0.9% FLUSH: 3.0000 mL | INTRAVENOUS | Status: DC | PRN

## 2020-05-28 MED ORDER — ROCURONIUM BROMIDE 100 MG/10ML IV SOLN
INTRAVENOUS | Status: DC | PRN
  Administered 2020-05-28: 60 mg via INTRAVENOUS
  Administered 2020-05-28: 40 mg via INTRAVENOUS

## 2020-05-28 MED ORDER — LIDOCAINE 2% (20 MG/ML) 5 ML SYRINGE
INTRAMUSCULAR | Status: AC
  Filled 2020-05-28: qty 5

## 2020-05-28 MED ORDER — ACETAMINOPHEN 500 MG PO TABS
1000.0000 mg | ORAL_TABLET | Freq: Once | ORAL | Status: AC
  Administered 2020-05-28: 1000 mg via ORAL

## 2020-05-28 MED ORDER — PHENYLEPHRINE 40 MCG/ML (10ML) SYRINGE FOR IV PUSH (FOR BLOOD PRESSURE SUPPORT)
PREFILLED_SYRINGE | INTRAVENOUS | Status: AC
  Filled 2020-05-28: qty 10

## 2020-05-28 MED ORDER — PHENYLEPHRINE HCL-NACL 10-0.9 MG/250ML-% IV SOLN
INTRAVENOUS | Status: DC | PRN
Start: 2020-05-28 — End: 2020-05-28
  Administered 2020-05-28: 25 ug/min via INTRAVENOUS

## 2020-05-28 MED ORDER — DEXAMETHASONE SODIUM PHOSPHATE 10 MG/ML IJ SOLN
INTRAMUSCULAR | Status: AC
  Filled 2020-05-28: qty 1

## 2020-05-28 MED ORDER — PHENYLEPHRINE HCL (PRESSORS) 10 MG/ML IV SOLN
INTRAVENOUS | Status: DC | PRN
  Administered 2020-05-28: 80 ug via INTRAVENOUS
  Administered 2020-05-28: 120 ug via INTRAVENOUS
  Administered 2020-05-28: 40 ug via INTRAVENOUS
  Administered 2020-05-28 (×2): 80 ug via INTRAVENOUS

## 2020-05-28 MED ORDER — ACETAMINOPHEN 325 MG PO TABS: 650.0000 mg | ORAL_TABLET | ORAL | Status: DC | PRN

## 2020-05-28 MED ORDER — CHLORHEXIDINE GLUCONATE CLOTH 2 % EX PADS: 6.0000 | MEDICATED_PAD | Freq: Once | CUTANEOUS | Status: DC

## 2020-05-28 MED ORDER — LIDOCAINE-EPINEPHRINE 1 %-1:100000 IJ SOLN
INTRAMUSCULAR | Status: DC | PRN
  Administered 2020-05-28: 33 mL via INTRAMUSCULAR

## 2020-05-28 MED ORDER — OXYCODONE HCL 5 MG PO TABS: 5.0000 mg | ORAL_TABLET | Freq: Once | ORAL | Status: DC | PRN

## 2020-05-28 MED ORDER — SODIUM CHLORIDE 0.9 % IV SOLN: 250.0000 mL | INTRAVENOUS | Status: DC | PRN

## 2020-05-28 MED ORDER — FENTANYL CITRATE (PF) 100 MCG/2ML IJ SOLN: 25.0000 ug | INTRAMUSCULAR | Status: DC | PRN

## 2020-05-28 MED ORDER — ACETAMINOPHEN 325 MG RE SUPP: 650.0000 mg | RECTAL | Status: DC | PRN

## 2020-05-28 MED ORDER — PROPOFOL 10 MG/ML IV BOLUS
INTRAVENOUS | Status: AC
  Filled 2020-05-28: qty 40

## 2020-05-28 MED ORDER — ONDANSETRON HCL 4 MG/2ML IJ SOLN
INTRAMUSCULAR | Status: DC | PRN
  Administered 2020-05-28: 4 mg via INTRAVENOUS

## 2020-05-28 MED ORDER — PROMETHAZINE HCL 25 MG/ML IJ SOLN: 6.2500 mg | INTRAMUSCULAR | Status: DC | PRN

## 2020-05-28 MED ORDER — SODIUM CHLORIDE (PF) 0.9 % IJ SOLN
INTRAMUSCULAR | Status: AC
  Filled 2020-05-28: qty 10

## 2020-05-28 MED ORDER — PROPOFOL 10 MG/ML IV BOLUS
INTRAVENOUS | Status: DC | PRN
  Administered 2020-05-28: 160 mg via INTRAVENOUS
  Administered 2020-05-28: 4 mg via INTRAVENOUS

## 2020-05-28 MED ORDER — CEFAZOLIN SODIUM-DEXTROSE 2-4 GM/100ML-% IV SOLN
2.0000 g | INTRAVENOUS | Status: AC
  Administered 2020-05-28: 2 g via INTRAVENOUS

## 2020-05-28 MED ORDER — EPHEDRINE 5 MG/ML INJ
INTRAVENOUS | Status: AC
  Filled 2020-05-28: qty 10

## 2020-05-28 MED ORDER — MIDAZOLAM HCL 2 MG/2ML IJ SOLN
INTRAMUSCULAR | Status: AC
  Filled 2020-05-28: qty 2

## 2020-05-28 MED ORDER — BUPIVACAINE HCL (PF) 0.25 % IJ SOLN
INTRAMUSCULAR | Status: AC
  Filled 2020-05-28: qty 30

## 2020-05-28 MED ORDER — ROCURONIUM BROMIDE 10 MG/ML (PF) SYRINGE
PREFILLED_SYRINGE | INTRAVENOUS | Status: AC
  Filled 2020-05-28: qty 10

## 2020-05-28 MED ORDER — LIDOCAINE 2% (20 MG/ML) 5 ML SYRINGE
INTRAMUSCULAR | Status: DC | PRN
  Administered 2020-05-28: 100 mg via INTRAVENOUS

## 2020-05-28 MED ORDER — ACETAMINOPHEN 500 MG PO TABS
ORAL_TABLET | ORAL | Status: AC
  Filled 2020-05-28: qty 2

## 2020-05-28 SURGICAL SUPPLY — 77 items
BAG DECANTER FOR FLEXI CONT (MISCELLANEOUS) ×3 IMPLANT
BINDER BREAST LRG (GAUZE/BANDAGES/DRESSINGS) IMPLANT
BINDER BREAST MEDIUM (GAUZE/BANDAGES/DRESSINGS) IMPLANT
BINDER BREAST XLRG (GAUZE/BANDAGES/DRESSINGS) IMPLANT
BINDER BREAST XXLRG (GAUZE/BANDAGES/DRESSINGS) ×3 IMPLANT
BIOPATCH RED 1 DISK 7.0 (GAUZE/BANDAGES/DRESSINGS) IMPLANT
BLADE HEX COATED 2.75 (ELECTRODE) IMPLANT
BLADE SURG 10 STRL SS (BLADE) ×6 IMPLANT
BLADE SURG 15 STRL LF DISP TIS (BLADE) ×4 IMPLANT
BLADE SURG 15 STRL SS (BLADE) ×6
BNDG GAUZE ELAST 4 BULKY (GAUZE/BANDAGES/DRESSINGS) IMPLANT
CANISTER SUCT 1200ML W/VALVE (MISCELLANEOUS) ×3 IMPLANT
COVER BACK TABLE 60X90IN (DRAPES) ×3 IMPLANT
COVER MAYO STAND STRL (DRAPES) ×3 IMPLANT
COVER WAND RF STERILE (DRAPES) IMPLANT
DECANTER SPIKE VIAL GLASS SM (MISCELLANEOUS) IMPLANT
DERMABOND ADVANCED (GAUZE/BANDAGES/DRESSINGS) ×2
DERMABOND ADVANCED .7 DNX12 (GAUZE/BANDAGES/DRESSINGS) ×4 IMPLANT
DRAIN CHANNEL 19F RND (DRAIN) IMPLANT
DRAPE LAPAROSCOPIC ABDOMINAL (DRAPES) ×3 IMPLANT
DRSG OPSITE POSTOP 4X6 (GAUZE/BANDAGES/DRESSINGS) ×6 IMPLANT
DRSG PAD ABDOMINAL 8X10 ST (GAUZE/BANDAGES/DRESSINGS) ×6 IMPLANT
ELECT BLADE 4.0 EZ CLEAN MEGAD (MISCELLANEOUS) ×3
ELECT COATED BLADE 2.86 ST (ELECTRODE) ×3 IMPLANT
ELECT REM PT RETURN 9FT ADLT (ELECTROSURGICAL) ×3
ELECTRODE BLDE 4.0 EZ CLN MEGD (MISCELLANEOUS) ×2 IMPLANT
ELECTRODE REM PT RTRN 9FT ADLT (ELECTROSURGICAL) ×2 IMPLANT
EVACUATOR SILICONE 100CC (DRAIN) IMPLANT
FUNNEL KELLER 2 DISP (MISCELLANEOUS) ×3 IMPLANT
GAUZE SPONGE 4X4 12PLY STRL (GAUZE/BANDAGES/DRESSINGS) IMPLANT
GAUZE SPONGE 4X4 12PLY STRL LF (GAUZE/BANDAGES/DRESSINGS) IMPLANT
GLOVE BIO SURGEON STRL SZ7 (GLOVE) ×3 IMPLANT
GLOVE SURG ENC MOIS LTX SZ6.5 (GLOVE) ×15 IMPLANT
GLOVE SURG UNDER POLY LF SZ7 (GLOVE) ×3 IMPLANT
GOWN STRL REUS W/ TWL LRG LVL3 (GOWN DISPOSABLE) ×8 IMPLANT
GOWN STRL REUS W/TWL LRG LVL3 (GOWN DISPOSABLE) ×12
IMPL BREAST P6.5XULT HI 650 (Breast) ×2 IMPLANT
IMPL BRST P6.5XULT HI 650CC (Breast) ×2 IMPLANT
IMPLANT BREAST GEL 650CC (Breast) ×3 IMPLANT
IV NS 1000ML (IV SOLUTION)
IV NS 1000ML BAXH (IV SOLUTION) IMPLANT
NDL SAFETY ECLIPSE 18X1.5 (NEEDLE) ×2 IMPLANT
NEEDLE HYPO 18GX1.5 SHARP (NEEDLE) ×3
NEEDLE HYPO 25X1 1.5 SAFETY (NEEDLE) ×3 IMPLANT
NS IRRIG 1000ML POUR BTL (IV SOLUTION) ×3 IMPLANT
PACK BASIN DAY SURGERY FS (CUSTOM PROCEDURE TRAY) ×3 IMPLANT
PENCIL SMOKE EVACUATOR (MISCELLANEOUS) ×3 IMPLANT
PIN SAFETY STERILE (MISCELLANEOUS) IMPLANT
SIZER BREAST REUSE 590CC (SIZER) ×3
SIZER BREAST REUSE 650CC (SIZER) ×3
SIZER BRST REUSE 590CC (SIZER) ×2 IMPLANT
SIZER BRST REUSE P6.4 650CC (SIZER) ×2 IMPLANT
SLEEVE SCD COMPRESS KNEE MED (MISCELLANEOUS) ×3 IMPLANT
SPONGE LAP 18X18 RF (DISPOSABLE) ×9 IMPLANT
STRIP CLOSURE SKIN 1/2X4 (GAUZE/BANDAGES/DRESSINGS) IMPLANT
STRIP SUTURE WOUND CLOSURE 1/2 (MISCELLANEOUS) ×6 IMPLANT
SUT MNCRL AB 3-0 PS2 18 (SUTURE) IMPLANT
SUT MNCRL AB 4-0 PS2 18 (SUTURE) ×12 IMPLANT
SUT MON AB 3-0 SH 27 (SUTURE) ×15
SUT MON AB 3-0 SH27 (SUTURE) ×10 IMPLANT
SUT MON AB 4-0 PS1 27 (SUTURE) IMPLANT
SUT MON AB 5-0 PS2 18 (SUTURE) ×9 IMPLANT
SUT PDS 3-0 CT2 (SUTURE) ×12
SUT PDS AB 2-0 CT2 27 (SUTURE) ×6 IMPLANT
SUT PDS II 3-0 CT2 27 ABS (SUTURE) ×8 IMPLANT
SUT SILK 3 0 PS 1 (SUTURE) IMPLANT
SUT VIC AB 3-0 SH 27 (SUTURE)
SUT VIC AB 3-0 SH 27X BRD (SUTURE) IMPLANT
SUT VICRYL 4-0 PS2 18IN ABS (SUTURE) IMPLANT
SYR BULB IRRIG 60ML STRL (SYRINGE) ×3 IMPLANT
SYR CONTROL 10ML LL (SYRINGE) ×3 IMPLANT
TAPE MEASURE VINYL STERILE (MISCELLANEOUS) ×3 IMPLANT
TOWEL GREEN STERILE FF (TOWEL DISPOSABLE) ×6 IMPLANT
TRAY DSU PREP LF (CUSTOM PROCEDURE TRAY) ×3 IMPLANT
TUBE CONNECTING 20X1/4 (TUBING) ×3 IMPLANT
UNDERPAD 30X36 HEAVY ABSORB (UNDERPADS AND DIAPERS) ×6 IMPLANT
YANKAUER SUCT BULB TIP NO VENT (SUCTIONS) ×3 IMPLANT

## 2020-05-28 NOTE — Discharge Instructions (Addendum)
INSTRUCTIONS FOR AFTER SURGERY   You will likely have some questions about what to expect following your operation.  The following information will help you and your family understand what to expect when you are discharged from the hospital.  Following these guidelines will help ensure a smooth recovery and reduce risks of complications.  Postoperative instructions include information on: diet, wound care, medications and physical activity.  AFTER SURGERY Expect to go home after the procedure.  In some cases, you may need to spend one night in the hospital for observation.  DIET This surgery does not require a specific diet.  However, I have to mention that the healthier you eat the better your body can start healing. It is important to increasing your protein intake.  This means limiting the foods with added sugar.  Focus on fruits and vegetables and some meat. It is very important to drink water after your surgery.  If your urine is bright yellow, then it is concentrated, and you need to drink more water.  As a general rule after surgery, you should have 8 ounces of water every hour while awake.  If you find you are persistently nauseated or unable to take in liquids let us know.  NO TOBACCO USE or EXPOSURE.  This will slow your healing process and increase the risk of a wound.  WOUND CARE If you don't have a drain: You can shower the day after surgery.  Use fragrance free soap.  Dial, [place], [place] and Cetaphil are usually mild on the skin.   If you have steri-strips / tape directly attached to your skin leave them in place. It is OK to get these wet.  No baths, pools or hot tubs for two weeks. We close your incision to leave the smallest and best-looking scar. No ointment or creams on your incisions until given the go ahead.  Especially not Neosporin (Too many skin reactions with this one).  A few weeks after surgery you can use Mederma and start massaging the scar. We ask you to wear your binder or  sports bra for the first 6 weeks around the clock, including while sleeping. This provides added comfort and helps reduce the fluid accumulation at the surgery site.  ACTIVITY No heavy lifting until cleared by the doctor.  It is OK to walk and climb stairs. In fact, moving your legs is very important to decrease your risk of a blood clot.  It will also help keep you from getting deconditioned.  Every 1 to 2 hours get up and walk for 5 minutes. This will help with a quicker recovery back to normal.  Let pain be your guide so you don't do too much.  NO, you cannot do the spring cleaning and don't plan on taking care of anyone else.  This is your time for TLC.   WORK Everyone returns to work at different times. As a rough guide, most people take at least 1 - 2 weeks off prior to returning to work. If you need documentation for your job, bring the forms to your postoperative follow up visit.  DRIVING Arrange for someone to bring you home from the hospital.  You may be able to drive a few days after surgery but not while taking any narcotics or valium.  BOWEL MOVEMENTS Constipation can occur after anesthesia and while taking pain medication.  It is important to stay ahead for your comfort.  We recommend taking Milk of Magnesia (2 tablespoons; twice a day) while  taking the pain pills.  SEROMA This is fluid your body tried to put in the surgical site.  This is normal but if it creates excessive pain and swelling let us know.  It usually decreases in a few weeks.  MEDICATIONS and PAIN CONTROL At your preoperative visit for you history and physical you were given the following medications: 1. An antibiotic: Start this medication when you get home and take according to the instructions on the bottle. 2. Zofran 4 mg:  This is to treat nausea and vomiting.  You can take this every 6 hours as needed and only if needed. 3. Norco (hydrocodone/acetaminophen) 5/325 mg:  This is only to be used after you have  taken the motrin or the tylenol. Every 8 hours as needed. Over the counter Medication to take: 4. Ibuprofen (Motrin) 600 mg:  Take this every 6 hours.  If you have additional pain then take 500 mg of the tylenol.  Only take the Norco after you have tried these two. 5. Miralax or stool softener of choice: Take this according to the bottle if you take the [place] Call your surgeon's office if any of the following occur: . Fever 101 degrees F or greater . Excessive bleeding or fluid from the incision site. . Pain that increases over time without aid from the medications . Redness, warmth, or pus draining from incision sites . Persistent nausea or inability to take in liquids . Severe misshapen area that underwent the operation.   May take Tylenol after 1:30pm, if needed.   Post Anesthesia Home Care Instructions  Activity: Get plenty of rest for the remainder of the day. A responsible individual must stay with you for 24 hours following the procedure.  For the next 24 hours, DO NOT: -Drive a car -Paediatric nurse -Drink alcoholic beverages -Take any medication unless instructed by your physician -Make any legal decisions or sign important papers.  Meals: Start with liquid foods such as gelatin or soup. Progress to regular foods as tolerated. Avoid greasy, spicy, heavy foods. If nausea and/or vomiting occur, drink only clear liquids until the nausea and/or vomiting subsides. Call your physician if vomiting continues.  Special Instructions/Symptoms: Your throat may feel dry or sore from the anesthesia or the breathing tube placed in your throat during surgery. If this causes discomfort, gargle with warm salt water. The discomfort should disappear within 24 hours.  If you had a scopolamine patch placed behind your ear for the management of post- operative nausea and/or vomiting:  1. The medication in the patch is effective for 72 hours, after which it should be removed.   Wrap patch in a tissue and discard in the trash. Wash hands thoroughly with soap and water. 2. You may remove the patch earlier than 72 hours if you experience unpleasant side effects which may include dry mouth, dizziness or visual disturbances. 3. Avoid touching the patch. Wash your hands with soap and water after contact with the patch.

## 2020-05-28 NOTE — Anesthesia Postprocedure Evaluation (Signed)
Anesthesia Post Note  Patient: Christy Ferrell  Procedure(s) Performed: REMOVAL OF TISSUE EXPANDER AND PLACEMENT OF IMPLANT (Right Breast) BREAST REDUCTION WITH MASTOPEXY (Left Breast)     Patient location during evaluation: PACU Anesthesia Type: General Level of consciousness: awake and alert and oriented Pain management: pain level controlled Vital Signs Assessment: post-procedure vital signs reviewed and stable Respiratory status: spontaneous breathing, nonlabored ventilation and respiratory function stable Cardiovascular status: blood pressure returned to baseline Postop Assessment: no apparent nausea or vomiting Anesthetic complications: no   No complications documented.              Brennan Bailey

## 2020-05-28 NOTE — Anesthesia Procedure Notes (Addendum)
Procedure Name: Intubation Date/Time: 05/28/2020 8:57 AM Performed by: Lavonia Dana, CRNA Pre-anesthesia Checklist: Patient identified, Emergency Drugs available, Suction available and Patient being monitored Patient Re-evaluated:Patient Re-evaluated prior to induction Oxygen Delivery Method: Circle system utilized Preoxygenation: Pre-oxygenation with 100% oxygen Induction Type: IV induction Ventilation: Mask ventilation without difficulty and Oral airway inserted - appropriate to patient size Laryngoscope Size: Mac and 3 Grade View: Grade II Tube type: Oral Tube size: 7.0 mm Number of attempts: 1 Airway Equipment and Method: Stylet,  Oral airway and Bite block Placement Confirmation: ETT inserted through vocal cords under direct vision,  positive ETCO2 and breath sounds checked- equal and bilateral Secured at: 22 cm Tube secured with: Tape Dental Injury: Teeth and Oropharynx as per pre-operative assessment  Comments: Patient has open sore on upper lip that was present prior to intubation

## 2020-05-28 NOTE — Transfer of Care (Signed)
Immediate Anesthesia Transfer of Care Note  Patient: Christy Ferrell  Procedure(s) Performed: REMOVAL OF TISSUE EXPANDER AND PLACEMENT OF IMPLANT (Right Breast) BREAST REDUCTION WITH MASTOPEXY (Left Breast)  Patient Location: PACU  Anesthesia Type:General  Level of Consciousness: drowsy  Airway & Oxygen Therapy: Patient Spontanous Breathing and Patient connected to face mask oxygen  Post-op Assessment: Report given to RN and Post -op Vital signs reviewed and stable  Post vital signs: Reviewed and stable  Last Vitals:  Vitals Value Taken Time  BP 141/79 05/28/20 1121  Temp    Pulse 100 05/28/20 1123  Resp 18 05/28/20 1123  SpO2 100 % 05/28/20 1123  Vitals shown include unvalidated device data.  Last Pain:  Vitals:   05/28/20 0727  TempSrc: Oral  PainSc: 0-No pain         Complications: No complications documented.

## 2020-05-28 NOTE — Op Note (Signed)
Op report    DATE OF OPERATION:  05/28/2020  LOCATION: [facility]  SURGICAL DIVISION: Plastic Surgery  PREOPERATIVE DIAGNOSES:  1. History of right breast cancer.  2. Acquired absence of right breast.  3. Breast asymmetry after breast cancer treatment.  POSTOPERATIVE DIAGNOSES:  Same as preop diagnosis  PROCEDURE:  1. Exchange of right tissue expander for silicone implant. 2. Right breast capsulotomies for implant respositioning. 3. Left breast mastopexy for symmetry. ~200 gm  SURGEON: Carmisha Larusso Sanger Ibraheem Voris, DO  ASSISTANT: Phoebe Sharps, PA  ANESTHESIA:  General.   COMPLICATIONS: None.   IMPLANTS: Right: Mentor Smooth Round Ultra High Profile Gel 650 cc. Ref #[id number].  Serial Number [id number]  INDICATIONS FOR PROCEDURE:  The patient, Christy Ferrell, is a 50 y.o. female born on 1970-05-29, is here for further treatment after a mastectomy and placement of a tissue expander. She now presents for exchange of her expander for an implant.  She requires capsulotomies to better position the implant. MRN: [id number]  CONSENT:  Informed consent was obtained directly from the patient. Risks, benefits and alternatives were fully discussed. Specific risks including but not limited to bleeding, infection, hematoma, seroma, scarring, pain, implant infection, implant extrusion, capsular contracture, asymmetry, wound healing problems, and need for further surgery were all discussed. The patient did have an ample opportunity to have her questions answered to her satisfaction.   DESCRIPTION OF PROCEDURE:  The patient was taken to the operating room. SCDs were placed and IV antibiotics were given. The patient's chest was prepped and draped in a sterile fashion. A time out was performed and the implants to be used were identified.  One percent Xylocaine with epinephrine was used to infiltrate the area.   The old mastectomy scar was opened and superior mastectomy and  inferior mastectomy flaps were re-raised over the pectoralis major muscle. The pectoralis was split at the ADM juncture to expose the tissue expander which was removed. Inspection of the pocket showed a normal healthy capsule and good integration of the biologic matrix laterally, superior but not at the anteriorly/inferior border.   Circumferential capsulotomies were performed to allow for breast pocket expansion.  Measurements were made to confirm adequate pocket size for the implant dimensions.  Hemostasis was ensured with electrocautery.  The pocket was irrigated with antibiotic solution.  New gloves were placed.  The implant was placed in the pocket and oriented appropriately. The pectoralis major muscle and capsule on the anterior surface were re-closed with a 3-0 running PDS suture. The remaining skin was closed with 3-0 and 4-0 Monocryl deep dermal and 5-0 Monocryl subcuticular stitches.  This was done in an extra layer due to the lack of ADM incorporation. Dermabond was applied.  Left side: Preoperative markings were confirmed.  Incision lines were injected with local with epinephrine.  After waiting for vasoconstriction, the marked lines were incised.  A Wise-pattern superomedial breast reduction was performed by de-epithelializing the pedicle, using bovie to create the superomedial pedicle, and removing breast tissue from the superior, lateral, and inferior portions of the breast.  Care was taken to not undermine the breast pedicle. Hemostasis was achieved.  The nipple was gently rotated into position and the soft tissue was closed with 4-0 Monocryl.  The patient was sat upright and size and shape symmetry was confirmed.  The pocket was irrigated and hemostasis confirmed.  The deep tissues were approximated with 3-0 PDS and Monocryl sutures and the skin was closed with deep dermal and subcuticular 4-0 Monocryl  sutures.  Dermabond was applied.  A breast binder and ABDs were placed.  The nipple and skin  flaps had good capillary refill at the end of the procedure.  The patient tolerated the procedure well. The patient was allowed to wake from anesthesia and taken to the recovery room in satisfactory condition.  The advanced practice practitioner (APP) assisted throughout the case.  The APP was essential in retraction and counter traction when needed to make the case progress smoothly.  This retraction and assistance made it possible to see the tissue plans for the procedure.  The assistance was needed for blood control, tissue re-approximation and assisted with closure of the incision site.

## 2020-05-28 NOTE — Interval H&P Note (Signed)
History and Physical Interval Note:  05/28/2020 8:22 AM  Christy Ferrell  has presented today for surgery, with the diagnosis of history of breast cancer, absence of righ breast, left breast asymmetry.  The various methods of treatment have been discussed with the patient and family. After consideration of risks, benefits and other options for treatment, the patient has consented to  Procedure(s) with comments: REMOVAL OF TISSUE EXPANDER AND PLACEMENT OF IMPLANT (Right) - 2.5 hours, please BREAST REDUCTION WITH MASTOPEXY (Left) as a surgical intervention.  The patient's history has been reviewed, patient examined, no change in status, stable for surgery.  I have reviewed the patient's chart and labs.  Questions were answered to the patient's satisfaction.     [name]

## 2020-05-29 ENCOUNTER — Encounter (HOSPITAL_BASED_OUTPATIENT_CLINIC_OR_DEPARTMENT_OTHER): Payer: Self-pay | Admitting: Plastic Surgery

## 2020-06-01 ENCOUNTER — Encounter: Payer: Self-pay | Admitting: *Deleted

## 2020-06-01 ENCOUNTER — Ambulatory Visit: Payer: No Typology Code available for payment source | Admitting: Physical Therapy

## 2020-06-01 LAB — SURGICAL PATHOLOGY

## 2020-06-03 ENCOUNTER — Encounter: Payer: Self-pay | Admitting: Surgical

## 2020-06-03 ENCOUNTER — Encounter: Payer: Self-pay | Admitting: Physical Therapy

## 2020-06-03 ENCOUNTER — Ambulatory Visit: Payer: No Typology Code available for payment source | Admitting: Physical Therapy

## 2020-06-03 ENCOUNTER — Ambulatory Visit (INDEPENDENT_AMBULATORY_CARE_PROVIDER_SITE_OTHER): Payer: No Typology Code available for payment source | Admitting: Surgical

## 2020-06-03 ENCOUNTER — Other Ambulatory Visit: Payer: Self-pay

## 2020-06-03 VITALS — BP 144/89 | HR 83

## 2020-06-03 DIAGNOSIS — M25611 Stiffness of right shoulder, not elsewhere classified: Secondary | ICD-10-CM

## 2020-06-03 DIAGNOSIS — R293 Abnormal posture: Secondary | ICD-10-CM

## 2020-06-03 DIAGNOSIS — D4861 Neoplasm of uncertain behavior of right breast: Secondary | ICD-10-CM

## 2020-06-03 DIAGNOSIS — C50511 Malignant neoplasm of lower-outer quadrant of right female breast: Secondary | ICD-10-CM

## 2020-06-03 DIAGNOSIS — Z483 Aftercare following surgery for neoplasm: Secondary | ICD-10-CM

## 2020-06-03 DIAGNOSIS — R600 Localized edema: Secondary | ICD-10-CM

## 2020-06-03 DIAGNOSIS — Z170 Estrogen receptor positive status [ER+]: Secondary | ICD-10-CM

## 2020-06-03 NOTE — Progress Notes (Signed)
Patient is a 50 year old female here for follow-up after exchange of right tissue expander for silicone implant, left breast mastopexy for symmetry with Dr. Marla Roe on 05/28/2020.  Patient had Mentor smooth round ultrahigh profile gel 650 cc implant placed in the right breast.  She reports overall she is doing well in regards to her recovery.  She has not had much pain.  She does report that she feels asymmetric, the left side being larger than the right side.  She is not having any infectious symptoms, bowel and bladder function is normal.  Chaperone present on exam On exam bilateral breast incisions are intact, honeycomb dressing overlying bilateral incisions.  Left NAC is viable with good capillary refill.  She does have some bruising around the left NAC.  There does not appear to be any dehiscence.  No erythema or cellulitic changes noted.  Minimal tenderness to palpation.  The left breast is more prominent than the right, however the right breast implant is sitting very high on her chest wall. No fluid wave noted.  In regards to the asymmetry, suspect that the right implant will settle with time and massage to be in a more comfortable and natural position.  She still has some swelling which will also improve with time.  I recommended continuing to monitor and we will discuss massaging the right breast implant into a more favorable position as her incisions heal more.  There is no sign of infection, seroma, hematoma.  She is scheduled to follow-up in approximately 2 weeks for reevaluation.  I recommend she call with any questions or concerns prior.  Continue to wear compressive garment 24/7, avoid strenuous activity.

## 2020-06-03 NOTE — Therapy (Signed)
[place], [place], [address] Phone: [phone]   Fax:  [phone]  Physical Therapy Treatment  Patient Details  Name: Christy Ferrell MRN: [id number] Date of Birth: 20-Nov-1970 Referring Provider (PT): Dr. Marlou Starks   Encounter Date: 06/03/2020   PT End of Session - 06/03/20 1453    Visit Number 7    Number of Visits 15    Date for PT Re-Evaluation 07/01/20    PT Start Time 6440    PT Stop Time 1450    PT Time Calculation (min) 45 min    Activity Tolerance Patient tolerated treatment well    Behavior During Therapy [facility] for tasks assessed/performed           Past Medical History:  Diagnosis Date  . Anxiety    h/o, was prev on zoloft  . Breast cancer ([place]) 01/2020   seeing Dr. Marlou Starks  . Cholelithiasis   . Diabetes mellitus without complication ([place])   . Family history of breast cancer   . Family history of leukemia   . Family history of lung cancer   . Family history of prostate cancer   . Family history of stomach cancer   . Hypothyroidism    postpartum hypothyroidism  . IUD (intrauterine device) in place    mirena per [employer]  . Major depression     Past Surgical History:  Procedure Laterality Date  . BREAST RECONSTRUCTION WITH PLACEMENT OF TISSUE EXPANDER AND FLEX HD (ACELLULAR HYDRATED DERMIS) Right 03/30/2020   Procedure: BREAST RECONSTRUCTION WITH PLACEMENT OF TISSUE EXPANDER AND FLEX HD (ACELLULAR HYDRATED DERMIS);  Surgeon: Wallace Going, DO;  Location: [place];  Service: Plastics;  Laterality: Right;  . BREAST REDUCTION WITH MASTOPEXY Left 05/28/2020   Procedure: BREAST REDUCTION WITH MASTOPEXY;  Surgeon: Wallace Going, DO;  Location: [place];  Service: Plastics;  Laterality: Left;  . CESAREAN SECTION     X 2 Fetal stress, second scheduled  . CHOLECYSTECTOMY  05/1998  . MASTECTOMY MODIFIED RADICAL Right 03/30/2020    Procedure: RIGHT MASTECTOMY MODIFIED RADICAL;  Surgeon: Jovita Kussmaul, MD;  Location: [place];  Service: General;  Laterality: Right;  [place], RNFA  . REMOVAL OF TISSUE EXPANDER AND PLACEMENT OF IMPLANT Right 05/28/2020   Procedure: REMOVAL OF TISSUE EXPANDER AND PLACEMENT OF IMPLANT;  Surgeon: Wallace Going, DO;  Location: [place];  Service: Plastics;  Laterality: Right;  2.5 hours, please    There were no vitals filed for this visit.   Subjective Assessment - 06/03/20 1407    Subjective My surgery went well. I am not having any soreness and everything is healing nicely.    Pertinent History Rt invasive breast cancer lower inner quadrant and DCIS/ 03/30/20- ER/PR +, HER 2-, ALND (2 of 9 positive), pt will require radiation and no chemo    Patient Stated Goals getting information from providers    Currently in Pain? No/denies    Pain Score 0-No pain                             [facility] Adult PT Treatment/Exercise - 06/03/20 0001      Manual Therapy   Soft tissue mobilization to back of RUE in area of tightness and along R lateral trunk    Passive ROM only to shoulder height in direction of abduction to work on cording  and pec tightness- pt is limited to shoulder height per dr orders until 2 weeks after surgery                       PT Long Term Goals - 06/03/20 1454      PT LONG TERM GOAL #1   Title Pt will demonstrate 170 degrees of right shoulder flexion to allow her to reach overhead.    Baseline 141; 06/03/20- not asssessed today bc pt limited to 90 degrees after surgery last week    Time 4    Period Weeks    Status On-going      PT LONG TERM GOAL #2   Title Pt will demonstrate 170 degrees of right shoulder abduction to allow pt to reach out to the side.    Baseline 99; 06/03/20-06/03/20- not asssessed today bc pt limited to 90 degrees after surgery last week    Time 4    Period Weeks    Status On-going       PT LONG TERM GOAL #3   Title Pt will demonstrate a 75% reduction in swelling inferior to expander to decrease risk of cellulitis.    Baseline 06/03/20- expander removed and implant placed    Time 4    Period Weeks    Status Deferred      PT LONG TERM GOAL #4   Title Pt will report no cording in RUE to allow pt to reach overhead without discomfort.    Baseline 06/03/20- pt still has cording in RUE causing increased tightness and discomfort    Time 4    Period Weeks    Status On-going      PT LONG TERM GOAL #5   Title Pt will be independent in a home exercise program for continued strengthening and stretching.    Time 4    Period Weeks    Status On-going                 Plan - 06/03/20 1455    Clinical Impression Statement Pt presents to PT today after recent impant placement one week ago. She was not allowed to raise her arm above shoulder height per order from her sugeon. Continued with MFR to axilla and along posterior arm to area of cording  with arm at 90 degrees of abduction. Performed some gentle soft tissue mobilization to R pec tendon and to serratus to decrease tightness. Assessed pt's progress towards goals in therapy today but was unable to measure ROM due to restrictions from surgery. Pt would benefit from continued skilled PT services to improve ROM following implant placement, decrease cording and progress pt towards independence with a home exercise program.    PT Frequency 2x / week    PT Duration 4 weeks    PT Treatment/Interventions ADLs/Self Care Home Management;Therapeutic exercise;Patient/family education;Manual techniques;Manual lymph drainage;Compression bandaging;Orthotic Fit/Training;Scar mobilization;Passive range of motion;Taping;Vasopneumatic Device    PT Next Visit Plan pt can return for PT next week for MFR/STW, MLD but no PROM for several weeks    PT Home Exercise Plan post op    Consulted and Agree with Plan of Care Patient            Patient will benefit from skilled therapeutic intervention in order to improve the following deficits and impairments:  Pain,Postural dysfunction,Impaired UE functional use,Increased fascial restricitons,Decreased range of motion,Decreased scar mobility,Increased edema,Decreased knowledge of precautions  Visit Diagnosis: Stiffness of right shoulder, not elsewhere classified  Aftercare  following surgery for neoplasm  Abnormal posture  Localized edema  Neoplasm of uncertain behavior of lower inner quadrant of female breast, right     Problem List Patient Active Problem List   Diagnosis Date Noted  . Family history of prostate cancer   . Family history of breast cancer   . Family history of leukemia   . Family history of lung cancer   . Family history of stomach cancer   . Hepatic steatosis 02/13/2020  . Malignant neoplasm of lower-outer quadrant of right breast of female, estrogen receptor positive ([place]) 01/29/2020  . Diabetes mellitus without complication ([place]) 34/74/2595  . Abnormal thyroid stimulating hormone (TSH) level 09/14/2016  . Hyperglycemia 09/14/2016  . Fever blister 09/14/2016  . Advance care planning 09/14/2016  . Cough 10/06/2014  . Headache(784.0) 06/05/2012  . Shoulder pain 01/18/2012  . Anxiety 02/18/2011  . Routine general medical examination at a health care facility 09/03/2010  . Hypothyroidism 12/14/2006    Allyson Sabal [facility] 06/03/2020, 2:59 PM  [place], [place], [address] Phone: [phone]   Fax:  [phone]  Name: Christy Ferrell MRN: [id number] Date of Birth: June 25, 1970  Manus Gunning, PT 06/03/20 2:59 PM

## 2020-06-08 ENCOUNTER — Other Ambulatory Visit: Payer: Self-pay

## 2020-06-08 ENCOUNTER — Encounter: Payer: Self-pay | Admitting: Physical Therapy

## 2020-06-08 ENCOUNTER — Ambulatory Visit: Payer: No Typology Code available for payment source | Admitting: Physical Therapy

## 2020-06-08 DIAGNOSIS — M25611 Stiffness of right shoulder, not elsewhere classified: Secondary | ICD-10-CM

## 2020-06-08 DIAGNOSIS — Z483 Aftercare following surgery for neoplasm: Secondary | ICD-10-CM

## 2020-06-08 DIAGNOSIS — C50511 Malignant neoplasm of lower-outer quadrant of right female breast: Secondary | ICD-10-CM | POA: Diagnosis not present

## 2020-06-08 NOTE — Therapy (Signed)
[place], [place], [address] Phone: [phone]   Fax:  [phone]  Physical Therapy Treatment  Patient Details  Name: Christy Ferrell MRN: [id number] Date of Birth: [birth year] Referring Provider (PT): Dr. Marlou Starks   Encounter Date: 06/08/2020   PT End of Session - 06/08/20 1459    Visit Number 8    Number of Visits 15    Date for PT Re-Evaluation 07/01/20    PT Start Time 1406    PT Stop Time 1456    PT Time Calculation (min) 50 min    Activity Tolerance Patient tolerated treatment well    Behavior During Therapy [facility] for tasks assessed/performed           Past Medical History:  Diagnosis Date  . Anxiety    h/o, was prev on zoloft  . Breast cancer ([place]) 01/2020   seeing Dr. Marlou Starks  . Cholelithiasis   . Diabetes mellitus without complication ([place])   . Family history of breast cancer   . Family history of leukemia   . Family history of lung cancer   . Family history of prostate cancer   . Family history of stomach cancer   . Hypothyroidism    postpartum hypothyroidism  . IUD (intrauterine device) in place    mirena per [employer]  . Major depression     Past Surgical History:  Procedure Laterality Date  . BREAST RECONSTRUCTION WITH PLACEMENT OF TISSUE EXPANDER AND FLEX HD (ACELLULAR HYDRATED DERMIS) Right 03/30/2020   Procedure: BREAST RECONSTRUCTION WITH PLACEMENT OF TISSUE EXPANDER AND FLEX HD (ACELLULAR HYDRATED DERMIS);  Surgeon: Wallace Going, DO;  Location: [place];  Service: Plastics;  Laterality: Right;  . BREAST REDUCTION WITH MASTOPEXY Left 05/28/2020   Procedure: BREAST REDUCTION WITH MASTOPEXY;  Surgeon: Wallace Going, DO;  Location: [place];  Service: Plastics;  Laterality: Left;  . CESAREAN SECTION     X 2 Fetal stress, second scheduled  . CHOLECYSTECTOMY  05/1998  . MASTECTOMY MODIFIED RADICAL Right 03/30/2020    Procedure: RIGHT MASTECTOMY MODIFIED RADICAL;  Surgeon: Jovita Kussmaul, MD;  Location: [place];  Service: General;  Laterality: Right;  [place], RNFA  . REMOVAL OF TISSUE EXPANDER AND PLACEMENT OF IMPLANT Right 05/28/2020   Procedure: REMOVAL OF TISSUE EXPANDER AND PLACEMENT OF IMPLANT;  Surgeon: Wallace Going, DO;  Location: [place];  Service: Plastics;  Laterality: Right;  2.5 hours, please    There were no vitals filed for this visit.   Subjective Assessment - 06/08/20 1406    Subjective I can move my arm up a little higher. I called and they said as long as I was comfortable.    Pertinent History Rt invasive breast cancer lower inner quadrant and DCIS/ 03/30/20- ER/PR +, HER 2-, ALND (2 of 9 positive), pt will require radiation and no chemo    Patient Stated Goals getting information from providers    Currently in Pain? No/denies    Pain Score 0-No pain                             [facility] Adult PT Treatment/Exercise - 06/08/20 0001      Manual Therapy   Soft tissue mobilization to back of RUE in area of tightness    Passive ROM only to shoulder height in direction of abduction to work on cording  and pec tightness- pt is limited to shoulder height per dr orders until 2 weeks after surgery                       PT Long Term Goals - 06/03/20 1454      PT LONG TERM GOAL #1   Title Pt will demonstrate 170 degrees of right shoulder flexion to allow her to reach overhead.    Baseline 141; 06/03/20- not asssessed today bc pt limited to 90 degrees after surgery last week    Time 4    Period Weeks    Status On-going      PT LONG TERM GOAL #2   Title Pt will demonstrate 170 degrees of right shoulder abduction to allow pt to reach out to the side.    Baseline 99; 06/03/20-06/03/20- not asssessed today bc pt limited to 90 degrees after surgery last week    Time 4    Period Weeks    Status On-going      PT LONG TERM  GOAL #3   Title Pt will demonstrate a 75% reduction in swelling inferior to expander to decrease risk of cellulitis.    Baseline 06/03/20- expander removed and implant placed    Time 4    Period Weeks    Status Deferred      PT LONG TERM GOAL #4   Title Pt will report no cording in RUE to allow pt to reach overhead without discomfort.    Baseline 06/03/20- pt still has cording in RUE causing increased tightness and discomfort    Time 4    Period Weeks    Status On-going      PT LONG TERM GOAL #5   Title Pt will be independent in a home exercise program for continued strengthening and stretching.    Time 4    Period Weeks    Status On-going                 Plan - 06/08/20 1459    Clinical Impression Statement Pt still having increased tightness due to cording in R axilla. Continued to work on this while keeping arm in about 90 degrees of abduction. Numerous cords palpable in her axilla today. She reports an increase in swelling in R breast. She has a post op follow up with her surgeon next week. Will begin MLD to this area if her surgeon clears her to do this.    PT Frequency 2x / week    PT Duration 4 weeks    PT Treatment/Interventions ADLs/Self Care Home Management;Therapeutic exercise;Patient/family education;Manual techniques;Manual lymph drainage;Compression bandaging;Orthotic Fit/Training;Scar mobilization;Passive range of motion;Taping;Vasopneumatic Device    PT Next Visit Plan pt can return for PT next week for MFR/STW, MLD but no PROM for several weeks    PT Home Exercise Plan post op    Consulted and Agree with Plan of Care Patient           Patient will benefit from skilled therapeutic intervention in order to improve the following deficits and impairments:  Pain,Postural dysfunction,Impaired UE functional use,Increased fascial restricitons,Decreased range of motion,Decreased scar mobility,Increased edema,Decreased knowledge of precautions  Visit  Diagnosis: Stiffness of right shoulder, not elsewhere classified  Aftercare following surgery for neoplasm     Problem List Patient Active Problem List   Diagnosis Date Noted  . Family history of prostate cancer   . Family history of breast cancer   . Family history of leukemia   .  Family history of lung cancer   . Family history of stomach cancer   . Hepatic steatosis 02/13/2020  . Malignant neoplasm of lower-outer quadrant of right breast of female, estrogen receptor positive ([place]) 01/29/2020  . Diabetes mellitus without complication ([place]) 79/06/4095  . Abnormal thyroid stimulating hormone (TSH) level 09/14/2016  . Hyperglycemia 09/14/2016  . Fever blister 09/14/2016  . Advance care planning 09/14/2016  . Cough 10/06/2014  . Headache(784.0) 06/05/2012  . Shoulder pain 01/18/2012  . Anxiety 02/18/2011  . Routine general medical examination at a health care facility 09/03/2010  . Hypothyroidism 12/14/2006    Allyson Sabal [facility] 06/08/2020, 3:02 PM  [place], [place], [address] Phone: [phone]   Fax:  [phone]  Name: Christy Ferrell MRN: [id number] Date of Birth: [birth year]  Manus Gunning, PT 06/08/20 3:02 PM

## 2020-06-10 ENCOUNTER — Encounter: Payer: Self-pay | Admitting: Physical Therapy

## 2020-06-10 ENCOUNTER — Other Ambulatory Visit: Payer: Self-pay

## 2020-06-10 ENCOUNTER — Ambulatory Visit: Payer: No Typology Code available for payment source | Admitting: Physical Therapy

## 2020-06-10 DIAGNOSIS — C50511 Malignant neoplasm of lower-outer quadrant of right female breast: Secondary | ICD-10-CM | POA: Diagnosis not present

## 2020-06-10 DIAGNOSIS — Z483 Aftercare following surgery for neoplasm: Secondary | ICD-10-CM

## 2020-06-10 DIAGNOSIS — M25611 Stiffness of right shoulder, not elsewhere classified: Secondary | ICD-10-CM

## 2020-06-10 NOTE — Therapy (Signed)
[facility] [address] [place], [place], [address] Phone: [phone]   Fax:  [phone]  Physical Therapy Treatment  Patient Details  Name: Christy Ferrell MRN: [id number] Date of Birth: 31-May-1970 Referring Provider (PT): Dr. Carolynne Edouard   Encounter Date: 06/10/2020   PT End of Session - 06/10/20 1455    Visit Number 9    Number of Visits 15    Date for PT Re-Evaluation 07/01/20    PT Start Time 1406    PT Stop Time 1452    PT Time Calculation (min) 46 min    Activity Tolerance Patient tolerated treatment well    Behavior During Therapy [facility] for tasks assessed/performed           Past Medical History:  Diagnosis Date  . Anxiety    h/o, was prev on zoloft  . Breast cancer ([facility]) 01/2020   seeing Dr. Carolynne Edouard  . Cholelithiasis   . Diabetes mellitus without complication ([facility])   . Family history of breast cancer   . Family history of leukemia   . Family history of lung cancer   . Family history of prostate cancer   . Family history of stomach cancer   . Hypothyroidism    postpartum hypothyroidism  . IUD (intrauterine device) in place    mirena per [employer]  . Major depression     Past Surgical History:  Procedure Laterality Date  . BREAST RECONSTRUCTION WITH PLACEMENT OF TISSUE EXPANDER AND FLEX HD (ACELLULAR HYDRATED DERMIS) Right 03/30/2020   Procedure: BREAST RECONSTRUCTION WITH PLACEMENT OF TISSUE EXPANDER AND FLEX HD (ACELLULAR HYDRATED DERMIS);  Surgeon: Peggye Form, DO;  Location: [facility];  Service: Plastics;  Laterality: Right;  . BREAST REDUCTION WITH MASTOPEXY Left 05/28/2020   Procedure: BREAST REDUCTION WITH MASTOPEXY;  Surgeon: Peggye Form, DO;  Location: [facility];  Service: Plastics;  Laterality: Left;  . CESAREAN SECTION     X 2 Fetal stress, second scheduled  . CHOLECYSTECTOMY  05/1998  . MASTECTOMY MODIFIED RADICAL Right 03/30/2020    Procedure: RIGHT MASTECTOMY MODIFIED RADICAL;  Surgeon: Griselda Miner, MD;  Location: [facility];  Service: General;  Laterality: Right;  [place], RNFA  . REMOVAL OF TISSUE EXPANDER AND PLACEMENT OF IMPLANT Right 05/28/2020   Procedure: REMOVAL OF TISSUE EXPANDER AND PLACEMENT OF IMPLANT;  Surgeon: Peggye Form, DO;  Location: [facility];  Service: Plastics;  Laterality: Right;  2.5 hours, please    There were no vitals filed for this visit.   Subjective Assessment - 06/10/20 1408    Subjective My arm is doing good. I got my boyfriend to massage the back of my arm.    Pertinent History Rt invasive breast cancer lower inner quadrant and DCIS/ 03/30/20- ER/PR +, HER 2-, ALND (2 of 9 positive), pt will require radiation and no chemo    Patient Stated Goals getting information from providers    Currently in Pain? No/denies    Pain Score 0-No pain                             [facility] Adult PT Treatment/Exercise - 06/10/20 0001      Manual Therapy   Soft tissue mobilization to back of RUE in area of tightness    Myofascial Release to cording in R axilla    Passive ROM only to shoulder height in direction  of abduction to work on cording and pec tightness- pt is limited to shoulder height per dr orders until 2 weeks after surgery                       PT Long Term Goals - 06/03/20 1454      PT LONG TERM GOAL #1   Title Pt will demonstrate 170 degrees of right shoulder flexion to allow her to reach overhead.    Baseline 141; 06/03/20- not asssessed today bc pt limited to 90 degrees after surgery last week    Time 4    Period Weeks    Status On-going      PT LONG TERM GOAL #2   Title Pt will demonstrate 170 degrees of right shoulder abduction to allow pt to reach out to the side.    Baseline 99; 06/03/20-06/03/20- not asssessed today bc pt limited to 90 degrees after surgery last week    Time 4    Period Weeks    Status  On-going      PT LONG TERM GOAL #3   Title Pt will demonstrate a 75% reduction in swelling inferior to expander to decrease risk of cellulitis.    Baseline 06/03/20- expander removed and implant placed    Time 4    Period Weeks    Status Deferred      PT LONG TERM GOAL #4   Title Pt will report no cording in RUE to allow pt to reach overhead without discomfort.    Baseline 06/03/20- pt still has cording in RUE causing increased tightness and discomfort    Time 4    Period Weeks    Status On-going      PT LONG TERM GOAL #5   Title Pt will be independent in a home exercise program for continued strengthening and stretching.    Time 4    Period Weeks    Status On-going                 Plan - 06/10/20 1456    Clinical Impression Statement Continued with myofascial to posterior R arm as well to cording in R axilla. Pt is still feeling tightness along posterior R upper arm so also performed soft tissue mobilization to this area. Pt will contact her doctor prior to her appt next week to see if she is cleared to begin MLD to R breast where she has developed swelling. She has a follow up with her surgeon next Friday.    PT Frequency 2x / week    PT Duration 4 weeks    PT Treatment/Interventions ADLs/Self Care Home Management;Therapeutic exercise;Patient/family education;Manual techniques;Manual lymph drainage;Compression bandaging;Orthotic Fit/Training;Scar mobilization;Passive range of motion;Taping;Vasopneumatic Device    PT Next Visit Plan pt can return for PT next week for MFR/STW, MLD but no PROM for several weeks    PT Home Exercise Plan post op    Consulted and Agree with Plan of Care Patient           Patient will benefit from skilled therapeutic intervention in order to improve the following deficits and impairments:  Pain,Postural dysfunction,Impaired UE functional use,Increased fascial restricitons,Decreased range of motion,Decreased scar mobility,Increased edema,Decreased  knowledge of precautions  Visit Diagnosis: Stiffness of right shoulder, not elsewhere classified  Aftercare following surgery for neoplasm     Problem List Patient Active Problem List   Diagnosis Date Noted  . Family history of prostate cancer   . Family history of breast  cancer   . Family history of leukemia   . Family history of lung cancer   . Family history of stomach cancer   . Hepatic steatosis 02/13/2020  . Malignant neoplasm of lower-outer quadrant of right breast of female, estrogen receptor positive ([place]) 01/29/2020  . Diabetes mellitus without complication ([place]) 67/34/1937  . Abnormal thyroid stimulating hormone (TSH) level 09/14/2016  . Hyperglycemia 09/14/2016  . Fever blister 09/14/2016  . Advance care planning 09/14/2016  . Cough 10/06/2014  . Headache(784.0) 06/05/2012  . Shoulder pain 01/18/2012  . Anxiety 02/18/2011  . Routine general medical examination at a health care facility 09/03/2010  . Hypothyroidism 12/14/2006    Allyson Sabal [facility] 06/10/2020, 2:58 PM  [place], [place], [address] Phone: [phone]   Fax:  [phone]  Name: GIAVONNA PFLUM MRN: [id number] Date of Birth: [birth year]  Manus Gunning, PT 06/10/20 2:58 PM

## 2020-06-11 ENCOUNTER — Telehealth: Payer: Self-pay

## 2020-06-11 NOTE — Telephone Encounter (Signed)
Call returned to pt regarding the following: She is asking if she can have "light massage" by her PT provider for "lymphatic drainage" right arm/axilla area. She has been attending PT sessions 2x/week for post surgical PT & increased ROM of right arm/shoulder. Her PT provider advised her to get permission for the lymphatic massage d/t her surgery on 05/28/20 with Dr. Marla Roe. She reports that her surgical incision/sites are healing and she has only slight discomfort.  She denies any fever/chills or any other complications. I consulted with [place], [place] & he confirmed that she can begin the lymphatic TX at this time- but with "light massage" until further evaluation by Dr. Marla Roe on 06/19/20. Pt understands the above plan & she is reminded to call for any concerns or questions. She will also let us know if her PT department needs an "order" to begin this [place].

## 2020-06-11 NOTE — Telephone Encounter (Signed)
Patient called to say that she saw her physical therapist and she wanted to know if it's ok for her to have a MLD massage for lymphatic fluid drainage.  Please call.

## 2020-06-12 ENCOUNTER — Other Ambulatory Visit: Payer: Self-pay

## 2020-06-12 ENCOUNTER — Ambulatory Visit (INDEPENDENT_AMBULATORY_CARE_PROVIDER_SITE_OTHER): Payer: No Typology Code available for payment source | Admitting: Family Medicine

## 2020-06-12 ENCOUNTER — Encounter: Payer: Self-pay | Admitting: Family Medicine

## 2020-06-12 VITALS — BP 120/82 | HR 91 | Temp 97.1°F | Ht 63.0 in | Wt 178.0 lb

## 2020-06-12 DIAGNOSIS — E119 Type 2 diabetes mellitus without complications: Secondary | ICD-10-CM

## 2020-06-12 LAB — POCT GLYCOSYLATED HEMOGLOBIN (HGB A1C): Hemoglobin A1C: 6.9 % — AB (ref 4.0–5.6)

## 2020-06-12 NOTE — Patient Instructions (Signed)
Thanks for your effort.  Don't change your meds.  Recheck in about 3-4 months with nonfasting A1c at the visit.  Take care.  Glad to see you.

## 2020-06-12 NOTE — Progress Notes (Signed)
This visit occurred during the SARS-CoV-2 public health emergency.  Safety protocols were in place, including screening questions prior to the visit, additional usage of staff PPE, and extensive cleaning of exam room while observing appropriate contact time as indicated for disinfecting solutions.  Diabetes:  No meds.  Hypoglycemic episodes: only if prolonged fasting.  Cautions d/w pt.   Hyperglycemic episodes: no Feet problems: no Blood Sugars averaging: not checked at home  eye exam within last year: due, d/w pt.   A1c 6.9.    She had breast surgery with f/u therapy pending.  She is going to get her IUD removed.  Mood is good.  "It hasn't been as bad as I was afraid it was going to be."    PMH and SH reviewed  Meds, vitals, and allergies reviewed.   ROS: Per HPI unless specifically indicated in ROS section   GEN: nad, alert and oriented HEENT: ncat NECK: supple w/o LA CV: rrr. PULM: ctab, no inc wob ABD: soft, +bs EXT: no edema SKIN: well perfused.

## 2020-06-14 NOTE — Assessment & Plan Note (Signed)
Considering the upheaval related to her recent surgery, her A1c is still below 7 and this is reasonable for now.  Continue work on diet and exercise and recheck periodically.  Would not change her medications at this point.  She agrees with plan.  See after visit summary.

## 2020-06-16 ENCOUNTER — Encounter: Payer: Self-pay | Admitting: Physical Therapy

## 2020-06-16 ENCOUNTER — Ambulatory Visit: Payer: No Typology Code available for payment source | Attending: General Surgery | Admitting: Physical Therapy

## 2020-06-16 ENCOUNTER — Other Ambulatory Visit: Payer: Self-pay

## 2020-06-16 DIAGNOSIS — R600 Localized edema: Secondary | ICD-10-CM | POA: Diagnosis present

## 2020-06-16 DIAGNOSIS — R293 Abnormal posture: Secondary | ICD-10-CM | POA: Diagnosis present

## 2020-06-16 DIAGNOSIS — M25611 Stiffness of right shoulder, not elsewhere classified: Secondary | ICD-10-CM | POA: Diagnosis present

## 2020-06-16 DIAGNOSIS — Z483 Aftercare following surgery for neoplasm: Secondary | ICD-10-CM | POA: Diagnosis present

## 2020-06-16 NOTE — Therapy (Signed)
[place], [place], [address] Phone: [phone]   Fax:  [phone]  Physical Therapy Treatment  Patient Details  Name: Christy Ferrell MRN: [id number] Date of Birth: 1970/12/18 Referring Provider (PT): Dr. Marlou Starks   Encounter Date: 06/16/2020   PT End of Session - 06/16/20 1415    Visit Number 10    Number of Visits 15    Date for PT Re-Evaluation 07/01/20    PT Start Time 1517    PT Stop Time 1358    PT Time Calculation (min) 53 min    Activity Tolerance Patient tolerated treatment well    Behavior During Therapy [facility] for tasks assessed/performed           Past Medical History:  Diagnosis Date  . Anxiety    h/o, was prev on zoloft  . Breast cancer ([place]) 01/2020   seeing Dr. Marlou Starks  . Cholelithiasis   . Diabetes mellitus without complication ([place])   . Family history of breast cancer   . Family history of leukemia   . Family history of lung cancer   . Family history of prostate cancer   . Family history of stomach cancer   . Hypothyroidism    postpartum hypothyroidism  . IUD (intrauterine device) in place    mirena per [employer]  . Major depression     Past Surgical History:  Procedure Laterality Date  . BREAST RECONSTRUCTION WITH PLACEMENT OF TISSUE EXPANDER AND FLEX HD (ACELLULAR HYDRATED DERMIS) Right 03/30/2020   Procedure: BREAST RECONSTRUCTION WITH PLACEMENT OF TISSUE EXPANDER AND FLEX HD (ACELLULAR HYDRATED DERMIS);  Surgeon: Wallace Going, DO;  Location: [place];  Service: Plastics;  Laterality: Right;  . BREAST REDUCTION WITH MASTOPEXY Left 05/28/2020   Procedure: BREAST REDUCTION WITH MASTOPEXY;  Surgeon: Wallace Going, DO;  Location: [place];  Service: Plastics;  Laterality: Left;  . CESAREAN SECTION     X 2 Fetal stress, second scheduled  . CHOLECYSTECTOMY  05/1998  . MASTECTOMY MODIFIED RADICAL Right 03/30/2020    Procedure: RIGHT MASTECTOMY MODIFIED RADICAL;  Surgeon: Jovita Kussmaul, MD;  Location: [place];  Service: General;  Laterality: Right;  [place], RNFA  . REMOVAL OF TISSUE EXPANDER AND PLACEMENT OF IMPLANT Right 05/28/2020   Procedure: REMOVAL OF TISSUE EXPANDER AND PLACEMENT OF IMPLANT;  Surgeon: Wallace Going, DO;  Location: [place];  Service: Plastics;  Laterality: Right;  2.5 hours, please    There were no vitals filed for this visit.   Subjective Assessment - 06/16/20 1308    Subjective I can lift my arm above by head but I am not allowed to do full ROM yet. I can do MLD per my dr.    Pertinent History Rt invasive breast cancer lower inner quadrant and DCIS/ 03/30/20- ER/PR +, HER 2-, ALND (2 of 9 positive), pt will require radiation and no chemo    Patient Stated Goals getting information from providers    Currently in Pain? No/denies    Pain Score 0-No pain                             [facility] Adult PT Treatment/Exercise - 06/16/20 0001      Manual Therapy   Myofascial Release to cording in R axilla    Manual Lymphatic Drainage (MLD) short neck, 5 diaphragmatic breaths, right inguinal LN, Right  axillo-inguinal pathway, left axillary nodes and establishment of interaxillary pathway, R breast moving fluid towards pathways then retracing all steps    Passive ROM only to shoulder height in direction of abduction to work on cording and pec tightness- pt is limited to just above shoulder height per dr orders                       PT Long Term Goals - 06/03/20 1454      PT LONG TERM GOAL #1   Title Pt will demonstrate 170 degrees of right shoulder flexion to allow her to reach overhead.    Baseline 141; 06/03/20- not asssessed today bc pt limited to 90 degrees after surgery last week    Time 4    Period Weeks    Status On-going      PT LONG TERM GOAL #2   Title Pt will demonstrate 170 degrees of right shoulder  abduction to allow pt to reach out to the side.    Baseline 99; 06/03/20-06/03/20- not asssessed today bc pt limited to 90 degrees after surgery last week    Time 4    Period Weeks    Status On-going      PT LONG TERM GOAL #3   Title Pt will demonstrate a 75% reduction in swelling inferior to expander to decrease risk of cellulitis.    Baseline 06/03/20- expander removed and implant placed    Time 4    Period Weeks    Status Deferred      PT LONG TERM GOAL #4   Title Pt will report no cording in RUE to allow pt to reach overhead without discomfort.    Baseline 06/03/20- pt still has cording in RUE causing increased tightness and discomfort    Time 4    Period Weeks    Status On-going      PT LONG TERM GOAL #5   Title Pt will be independent in a home exercise program for continued strengthening and stretching.    Time 4    Period Weeks    Status On-going                 Plan - 06/16/20 1506    Clinical Impression Statement Continued with myofascial release to R axilla. Only 1 cord noted today with less tightness palpable. Began MLD to R breast since pt has been having increased swelling in this area. She called her doctor and was given approval to begin MLD to R breast. She will see her doctor later this week for a post op visit and hopefully be cleared for full shoulder ROM at that time.    PT Frequency 2x / week    PT Duration 4 weeks    PT Treatment/Interventions ADLs/Self Care Home Management;Therapeutic exercise;Patient/family education;Manual techniques;Manual lymph drainage;Compression bandaging;Orthotic Fit/Training;Scar mobilization;Passive range of motion;Taping;Vasopneumatic Device    PT Next Visit Plan MLD to R breast, ROM to shoulder height, MFR to cording R axilla    PT Home Exercise Plan post op    Consulted and Agree with Plan of Care Patient           Patient will benefit from skilled therapeutic intervention in order to improve the following deficits and  impairments:  Pain,Postural dysfunction,Impaired UE functional use,Increased fascial restricitons,Decreased range of motion,Decreased scar mobility,Increased edema,Decreased knowledge of precautions  Visit Diagnosis: Stiffness of right shoulder, not elsewhere classified  Localized edema  Aftercare following surgery for neoplasm  Abnormal posture  Problem List Patient Active Problem List   Diagnosis Date Noted  . Family history of prostate cancer   . Family history of breast cancer   . Family history of leukemia   . Family history of lung cancer   . Family history of stomach cancer   . Hepatic steatosis 02/13/2020  . Malignant neoplasm of lower-outer quadrant of right breast of female, estrogen receptor positive ([place]) 01/29/2020  . Diabetes mellitus without complication ([place]) [id number]  . Abnormal thyroid stimulating hormone (TSH) level 09/14/2016  . Hyperglycemia 09/14/2016  . Fever blister 09/14/2016  . Advance care planning 09/14/2016  . Cough 10/06/2014  . Headache(784.0) 06/05/2012  . Shoulder pain 01/18/2012  . Anxiety 02/18/2011  . Routine general medical examination at a health care facility 09/03/2010  . Hypothyroidism 12/14/2006    Allyson Sabal [facility] 06/16/2020, 3:07 PM  [place], [place], [address] Phone: [phone]   Fax:  [phone]  Name: Christy Ferrell MRN: [id number] Date of Birth: [birth year]  Manus Gunning, PT 06/16/20 3:07 PM

## 2020-06-18 ENCOUNTER — Other Ambulatory Visit: Payer: Self-pay

## 2020-06-18 ENCOUNTER — Ambulatory Visit: Payer: No Typology Code available for payment source | Admitting: Physical Therapy

## 2020-06-18 DIAGNOSIS — R293 Abnormal posture: Secondary | ICD-10-CM

## 2020-06-18 DIAGNOSIS — M25611 Stiffness of right shoulder, not elsewhere classified: Secondary | ICD-10-CM | POA: Diagnosis not present

## 2020-06-18 DIAGNOSIS — Z483 Aftercare following surgery for neoplasm: Secondary | ICD-10-CM

## 2020-06-18 DIAGNOSIS — R600 Localized edema: Secondary | ICD-10-CM

## 2020-06-18 NOTE — Therapy (Signed)
[place], [place], [address] Phone: [phone]   Fax:  [phone]  Physical Therapy Treatment  Patient Details  Name: Christy Ferrell MRN: [id number] Date of Birth: [birth year] Referring Provider (PT): Dr. Marlou Starks   Encounter Date: 06/18/2020   PT End of Session - 06/18/20 1553    Visit Number 11    Number of Visits 15    Date for PT Re-Evaluation 07/01/20    PT Start Time 1500    PT Stop Time 1545    PT Time Calculation (min) 45 min    Activity Tolerance Patient tolerated treatment well    Behavior During Therapy [facility] for tasks assessed/performed           Past Medical History:  Diagnosis Date  . Anxiety    h/o, was prev on zoloft  . Breast cancer ([place]) 01/2020   seeing Dr. Marlou Starks  . Cholelithiasis   . Diabetes mellitus without complication ([place])   . Family history of breast cancer   . Family history of leukemia   . Family history of lung cancer   . Family history of prostate cancer   . Family history of stomach cancer   . Hypothyroidism    postpartum hypothyroidism  . IUD (intrauterine device) in place    mirena per [employer]  . Major depression     Past Surgical History:  Procedure Laterality Date  . BREAST RECONSTRUCTION WITH PLACEMENT OF TISSUE EXPANDER AND FLEX HD (ACELLULAR HYDRATED DERMIS) Right 03/30/2020   Procedure: BREAST RECONSTRUCTION WITH PLACEMENT OF TISSUE EXPANDER AND FLEX HD (ACELLULAR HYDRATED DERMIS);  Surgeon: Wallace Going, DO;  Location: [place];  Service: Plastics;  Laterality: Right;  . BREAST REDUCTION WITH MASTOPEXY Left 05/28/2020   Procedure: BREAST REDUCTION WITH MASTOPEXY;  Surgeon: Wallace Going, DO;  Location: [place];  Service: Plastics;  Laterality: Left;  . CESAREAN SECTION     X 2 Fetal stress, second scheduled  . CHOLECYSTECTOMY  05/1998  . MASTECTOMY MODIFIED RADICAL Right 03/30/2020    Procedure: RIGHT MASTECTOMY MODIFIED RADICAL;  Surgeon: Jovita Kussmaul, MD;  Location: [place];  Service: General;  Laterality: Right;  [place], RNFA  . REMOVAL OF TISSUE EXPANDER AND PLACEMENT OF IMPLANT Right 05/28/2020   Procedure: REMOVAL OF TISSUE EXPANDER AND PLACEMENT OF IMPLANT;  Surgeon: Wallace Going, DO;  Location: [place];  Service: Plastics;  Laterality: Right;  2.5 hours, please    There were no vitals filed for this visit.   Subjective Assessment - 06/18/20 1507    Subjective Pt says she is going to see Dr. Marla Roe tomorrow and hopes to be able to do full shoulder ROM at that time. She sees Dr. Isidore Moos on Feb 8 for radiation    Pertinent History Rt invasive breast cancer lower inner quadrant and DCIS/ 03/30/20- ER/PR +, HER 2-, ALND (2 of 9 positive), pt will require radiation and no chemo    Currently in Pain? No/denies                             [facility] Adult PT Treatment/Exercise - 06/18/20 0001      Manual Therapy   Manual Therapy Myofascial release;Soft tissue mobilization;Manual Lymphatic Drainage (MLD)    Soft tissue mobilization with coconut oil to posterior shoulder, back and latts    Myofascial Release to cording in R  axilla down lateral trunk also prolonged pressure to trigger points and tightness in axilla at latts and teres major tendon    Manual Lymphatic Drainage (MLD) short neck, 5 diaphragmatic breaths, right inguinal LN, Right axillo-inguinal pathway, left axillary nodes and establishment of interaxillary pathway, R breast moving fluid towards pathways then retracing all steps then to sidelying for posterior interaxillary anastamosis and lateral trunk                       PT Long Term Goals - 06/03/20 1454      PT LONG TERM GOAL #1   Title Pt will demonstrate 170 degrees of right shoulder flexion to allow her to reach overhead.    Baseline 141; 06/03/20- not asssessed today bc pt  limited to 90 degrees after surgery last week    Time 4    Period Weeks    Status On-going      PT LONG TERM GOAL #2   Title Pt will demonstrate 170 degrees of right shoulder abduction to allow pt to reach out to the side.    Baseline 99; 06/03/20-06/03/20- not asssessed today bc pt limited to 90 degrees after surgery last week    Time 4    Period Weeks    Status On-going      PT LONG TERM GOAL #3   Title Pt will demonstrate a 75% reduction in swelling inferior to expander to decrease risk of cellulitis.    Baseline 06/03/20- expander removed and implant placed    Time 4    Period Weeks    Status Deferred      PT LONG TERM GOAL #4   Title Pt will report no cording in RUE to allow pt to reach overhead without discomfort.    Baseline 06/03/20- pt still has cording in RUE causing increased tightness and discomfort    Time 4    Period Weeks    Status On-going      PT LONG TERM GOAL #5   Title Pt will be independent in a home exercise program for continued strengthening and stretching.    Time 4    Period Weeks    Status On-going                 Plan - 06/18/20 1554    Clinical Impression Statement Pt with palpable and visible softening of cording in axilla at end of session.  Talked to pt about importance of adding moderate level intensity of walking especially during radiation to help decrease fatigue and to do strength training of upper quadrant after treatment is over to decrease risk of lymphedema    Stability/Clinical Decision Making Stable/Uncomplicated    Rehab Potential Excellent    PT Frequency 2x / week    PT Duration 4 weeks    PT Treatment/Interventions ADLs/Self Care Home Management;Therapeutic exercise;Patient/family education;Manual techniques;Manual lymph drainage;Compression bandaging;Orthotic Fit/Training;Scar mobilization;Passive range of motion;Taping;Vasopneumatic Device    PT Next Visit Plan MLD to R breast, ROM to shoulder height, MFR to cording R axilla  with soft tissue work consider teaching strength ABC program prior to discharge    Consulted and Agree with Plan of Care Patient           Patient will benefit from skilled therapeutic intervention in order to improve the following deficits and impairments:  Pain,Postural dysfunction,Impaired UE functional use,Increased fascial restricitons,Decreased range of motion,Decreased scar mobility,Increased edema,Decreased knowledge of precautions  Visit Diagnosis: Stiffness of right shoulder, not elsewhere  classified  Localized edema  Aftercare following surgery for neoplasm  Abnormal posture     Problem List Patient Active Problem List   Diagnosis Date Noted  . Family history of prostate cancer   . Family history of breast cancer   . Family history of leukemia   . Family history of lung cancer   . Family history of stomach cancer   . Hepatic steatosis 02/13/2020  . Malignant neoplasm of lower-outer quadrant of right breast of female, estrogen receptor positive ([place]) 01/29/2020  . Diabetes mellitus without complication ([place]) 32/95/1884  . Abnormal thyroid stimulating hormone (TSH) level 09/14/2016  . Hyperglycemia 09/14/2016  . Fever blister 09/14/2016  . Advance care planning 09/14/2016  . Cough 10/06/2014  . Headache(784.0) 06/05/2012  . Shoulder pain 01/18/2012  . Anxiety 02/18/2011  . Routine general medical examination at a health care facility 09/03/2010  . Hypothyroidism 12/14/2006   Donato Heinz. Owens Shark PT  Norwood Levo 06/18/2020, 3:56 PM  [place], [place], [address] Phone: [phone]   Fax:  [phone]  Name: Christy Ferrell MRN: [id number] Date of Birth: 1970-07-27

## 2020-06-19 ENCOUNTER — Ambulatory Visit (INDEPENDENT_AMBULATORY_CARE_PROVIDER_SITE_OTHER): Payer: No Typology Code available for payment source | Admitting: Plastic Surgery

## 2020-06-19 ENCOUNTER — Other Ambulatory Visit: Payer: Self-pay

## 2020-06-19 ENCOUNTER — Encounter: Payer: Self-pay | Admitting: Plastic Surgery

## 2020-06-19 VITALS — BP 132/87 | HR 87

## 2020-06-19 DIAGNOSIS — Z170 Estrogen receptor positive status [ER+]: Secondary | ICD-10-CM

## 2020-06-19 DIAGNOSIS — C50511 Malignant neoplasm of lower-outer quadrant of right female breast: Secondary | ICD-10-CM

## 2020-06-19 NOTE — Progress Notes (Signed)
   Subjective:    Patient ID: Christy Ferrell, female    DOB: 1971-04-03, 50 y.o.   MRN: [id number]  The patient is a 50 year old female here for follow-up on her breast reconstruction.  She had right breast cancer and underwent a mastectomy with reconstruction.  In January we did an exchange of the right breast expander for silicone implant and a left breast mastopexy for symmetry.  She has a 650 cc gel implant and on the right.  The Steri-Strips are in place.  There is no sign of seroma or hematoma.  No sign of infection.  There is bruising and swelling as expected.  She is planning on seeing the radiation folks next week.  She notices some tingling to the areola of the left breast and knows this is normal after surgery.    Review of Systems  Constitutional: Negative.   Eyes: Negative.   Genitourinary: Negative.   Musculoskeletal: Negative.        Objective:   Physical Exam Vitals and nursing note reviewed.  Constitutional:      Appearance: Normal appearance.  HENT:     Head: Normocephalic and atraumatic.  Cardiovascular:     Rate and Rhythm: Normal rate.     Pulses: Normal pulses.  Neurological:     General: No focal deficit present.     Mental Status: She is alert.  Psychiatric:        Mood and Affect: Mood normal.        Behavior: Behavior normal.          Assessment & Plan:     ICD-10-CM   1. Malignant neoplasm of lower-outer quadrant of right breast of female, estrogen receptor positive ([place])  C50.511    Z17.0     Continue with the sports bra.  Follow-up in 2 weeks. Pictures were obtained of the patient and placed in the chart with the patient's or guardian's permission.

## 2020-06-19 NOTE — Progress Notes (Signed)
Location of Breast Cancer: Malignant neoplasm of lower-outer quadrant of RIGHT breast, estrogen receptor positive  Histology per Pathology Report:  03/30/2020 FINAL MICROSCOPIC DIAGNOSIS:  A. BREAST, RIGHT, MODIFIED RADICAL MASTECTOMY:  - Invasive and in situ ductal carcinoma.  - Margins not involved.  - Lymphovascular space involvement.  - Biopsy sites and biopsy clips.  - Metastatic carcinoma in two of nine lymph nodes (2/9).  Receptor Status: ER(95%), PR (95%), Her2-neu (Negative), Ki-67(Not performed)  Did patient present with symptoms (if so, please note symptoms) or was this found on screening mammography?:  Patient palpated a right breast mass. She underwent bilateral diagnostic mammography with tomography and right breast ultrasonography at [facility] on 01/15/2020 showing: breast density category B; palpable 1.6 cm mass in right breast at 5:30, with adjacent 5 mm probable satellite mass; 0.9 cm mass in retroareolar right breast, also at 5:30; two enlarged lymph nodes in right breast at 9:30.  Past/Anticipated interventions by surgeon, if any: 05/28/2020 --Dr. Audelia Hives (plastic surgeon) 1. Exchange of right tissue expander for silicone implant. 2. Right breast capsulotomies for implant respositioning. 3. Left breast mastopexy for symmetry. ~200 gm  03/30/2020 --Dr. Autumn Messing RIGHT MASTECTOMY MODIFIED RADICAL --Dr. Audelia Hives (plastic surgeon) Right immediate breast reconstruction with placement of Acellular Dermal Matrix and tissue expanders  Past/Anticipated interventions by medical oncology, if any:  Under care of Dr. Lurline Del 04/22/2020 (1) genetics testing in process (2) MammaPrint obtained from the 01/17/2020 biopsy read as "low risk" predicting a 96% chance of distant disease free survival at 5 years without the need for chemotherapy (3) tamoxifen started 02/13/2020 in anticipation of surgical delays             (a) to start ovarian  suppression 04/30/2020 with monthly goserelin/Zoladex (4) status post right modified radical mastectomy 03/30/2020 for a pT3 pN1, stage IIA invasive ductal carcinoma, grade 2, with evidence of lymphovascular space involvement but negative margins             (a) a total of 9 right axillary lymph nodes removed, 2+ with  focal extracapsular extension (5) adjuvant radiation to follow --I think rather than adding chemotherapy to this luminal a type of breast cancer, which is likely to be futile, she would benefit from ovarian suppression together with tamoxifen.  We discussed the possible toxicities side effects and complications of starting goserelin and she is agreeable to beginning 04/30/2020.   --She will see me with the second dose (06/23/2020), a month later just to make sure she is tolerating this well  Lymphedema issues, if any:  Patient denies. She is going to PT twice a week to help manage swelling from surgery, and decrease chance of developing lymphedema   Pain issues, if any:  Patient reports mild tenderness/soreness from PT session yesterday   SAFETY ISSUES:  Prior radiation? No  Pacemaker/ICD? No  Possible current pregnancy? Has IUD (though plans on having removed)  Is the patient on methotrexate? No  Current Complaints / other details:   Patient has not received any version of the COVID-19 vaccine

## 2020-06-22 ENCOUNTER — Other Ambulatory Visit: Payer: Self-pay

## 2020-06-22 ENCOUNTER — Encounter: Payer: Self-pay | Admitting: Physical Therapy

## 2020-06-22 ENCOUNTER — Ambulatory Visit: Payer: No Typology Code available for payment source | Admitting: Physical Therapy

## 2020-06-22 DIAGNOSIS — R600 Localized edema: Secondary | ICD-10-CM

## 2020-06-22 DIAGNOSIS — Z483 Aftercare following surgery for neoplasm: Secondary | ICD-10-CM

## 2020-06-22 DIAGNOSIS — M25611 Stiffness of right shoulder, not elsewhere classified: Secondary | ICD-10-CM | POA: Diagnosis not present

## 2020-06-22 NOTE — Progress Notes (Signed)
[name]  [phone] [phone]     ID: Christy Ferrell DOB: 1971/04/16  MR#: [id number]  PFY#:[id number]  Patient Care Team: [name], MD as PCP - General (Family Medicine) Mauro Kaufmann, RN as Oncology Nurse Navigator Rockwell Germany, RN as Oncology Nurse Navigator Krystopher Kuenzel, Virgie Dad, MD as Consulting Physician (Oncology) Jovita Kussmaul, MD as Consulting Physician (General Surgery) Dillingham, Loel Lofty, DO as Attending Physician (Plastic Surgery) Chauncey Cruel, MD OTHER MD:  CHIEF COMPLAINT: Estrogen receptor positive breast cancer (s/p right mastectomy)  CURRENT TREATMENT: tamoxifen, goserelin   INTERVAL HISTORY: Christy "Christy Ferrell" returns today for follow up of her estrogen receptor positive breast cancer.   She was started on tamoxifen at her last visit on 02/13/2020.  She does have significant hot flashes from this although she says they do not wake her up.  She has neither vaginal wetness or vaginal dryness at this time.  She has no other side effects from this medication that she is aware of..  Since her last visit, she underwent right breast implant placement and left breast mastopexy on 05/28/2020 under Dr. Marla Roe. Pathology from the procedure ([id number]) was benign.  She receives goserelin monthly.  Today will be her third dose.   REVIEW OF SYSTEMS: Christy Ferrell is back to work full-time.  She generally feels well.  She does have some cording and goes to physical therapy twice a week for this.  She thinks her skin is a bit dry but it tends to be that way and winter she says.  A detailed review of systems was otherwise stable   COVID 19 VACCINATION STATUS: Not vaccinated as of February 2022   HISTORY OF CURRENT ILLNESS: From the original visit note:  Christy Ferrell herself palpated a right breast mass. She underwent bilateral diagnostic mammography with tomography and right breast ultrasonography at [facility] on 01/15/2020 showing: breast density category B; palpable 1.6 cm mass in right breast at 5:30, with adjacent 5 mm probable satellite mass; 0.9 cm mass in retroareolar right breast, also at 5:30; two enlarged lymph nodes in right breast at 9:30.  Accordingly on 01/17/2020 she proceeded to biopsy of the right breast areas in question. The pathology from this procedure ([id number]) showed:  1. Right Breast, 5:30  - invasive mammary carcinoma, grade 2  - ductal carcinoma in situ, low grade.   - Prognostic indicators significant for: estrogen receptor, 95% positive and progesterone receptor, 95% positive, both with strong staining intensity. Proliferation marker Ki67 not obtained. HER2 negative by immunohistochemistry (0). 2. Right Breast, retroareolar  - ductal carcinoma in situ 3. Lymph Node, right axilla  - invasive mammary carcinoma  - lymph node tissue not identified  We do not have an e-cadherin or Ki67 on this tissue.  The patient's subsequent history is as detailed below.   PAST MEDICAL HISTORY: Past Medical History:  Diagnosis Date  . Anxiety    h/o, was prev on zoloft  . Breast cancer ([place]) 01/2020   seeing Dr. Marlou Starks  . Cholelithiasis   . Diabetes mellitus without complication ([place])   . Family history of breast cancer   . Family history of leukemia   . Family history of lung cancer   . Family history of prostate cancer   . Family history of stomach cancer   . Hypothyroidism    postpartum hypothyroidism  . IUD (intrauterine device) in place    mirena per [employer]  . Major depression  PAST SURGICAL HISTORY: Past Surgical History:  Procedure Laterality Date  . BREAST RECONSTRUCTION WITH PLACEMENT OF TISSUE EXPANDER AND FLEX HD (ACELLULAR HYDRATED DERMIS) Right 03/30/2020   Procedure: BREAST RECONSTRUCTION WITH PLACEMENT OF TISSUE EXPANDER AND FLEX HD (ACELLULAR HYDRATED DERMIS);  Surgeon: Wallace Going, DO;  Location: [place];   Service: Plastics;  Laterality: Right;  . BREAST REDUCTION WITH MASTOPEXY Left 05/28/2020   Procedure: BREAST REDUCTION WITH MASTOPEXY;  Surgeon: Wallace Going, DO;  Location: [place];  Service: Plastics;  Laterality: Left;  . CESAREAN SECTION     X 2 Fetal stress, second scheduled  . CHOLECYSTECTOMY  05/1998  . MASTECTOMY MODIFIED RADICAL Right 03/30/2020   Procedure: RIGHT MASTECTOMY MODIFIED RADICAL;  Surgeon: Jovita Kussmaul, MD;  Location: [place];  Service: General;  Laterality: Right;  [place], RNFA  . REMOVAL OF TISSUE EXPANDER AND PLACEMENT OF IMPLANT Right 05/28/2020   Procedure: REMOVAL OF TISSUE EXPANDER AND PLACEMENT OF IMPLANT;  Surgeon: Wallace Going, DO;  Location: [place];  Service: Plastics;  Laterality: Right;  2.5 hours, please    FAMILY HISTORY: Family History  Problem Relation Age of Onset  . Hypertension Father   . Depression Father   . Prostate cancer Father 79       Prostate, S/P prostatectomy, metastatic  . Asthma Sister   . Spina bifida Sister   . Hypothyroidism Mother   . Hypertension Maternal Grandmother   . Hyperlipidemia Maternal Grandmother   . Lung disease Maternal Grandmother   . Lung cancer Maternal Grandmother 74  . Hypothyroidism Maternal Grandfather   . Prostate cancer Maternal Grandfather   . Cancer Maternal Grandfather        lung, stomach, and prostate cancers  . Emphysema Paternal Grandmother   . Diabetes Maternal Uncle   . Diabetes Maternal Uncle   . Leukemia Maternal Uncle 61  . Prostate cancer Maternal Uncle        dx in his 23s/70s, metastatic  . Breast cancer Other 45       negative genetic testing; maternal first cousin  . Stroke Neg Hx   . Colon cancer Neg Hx    The patient's father died at age 13 from prostate cancer.  The patient's mother is 86 years old as of September 2021.  The patient has 1 uncle with pancreatic cancer and one cousin on the same side of the  family with breast cancer.   GYNECOLOGIC HISTORY:  No LMP recorded. (Menstrual status: IUD). Menarche: 50 years old Age at first live birth: 50 years old [place] P 2 Contraceptive Mirena in place Hysterectomy?  No BSO?  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  She is divorced.  She lives alone although her significant other Larita Fife (who is disabled secondary to COPD) sometimes comes over.  Her sons are Aileen Pilot, who is Chief Strategy Officer at [employer], who is also at [employer] considering dentistry or physical therapy.   ADVANCED DIRECTIVES: Not in place.  The patient intends to name her older son as her healthcare power of attorney and the appropriate documents were giving her on 01/29/2020 for her to complete and notarized at her discretion   HEALTH MAINTENANCE: Social History   Tobacco Use  . Smoking status: Never Smoker  . Smokeless tobacco: Never Used  Vaping Use  . Vaping Use: Never used  Substance  Use Topics  . Alcohol use: Yes    Alcohol/week: 0.0 standard drinks    Comment: Very rarely.  . Drug use: No     Colonoscopy:   PAP: Not up-to-date  Bone density: Never   Allergies  Allergen Reactions  . Codeine Nausea And Vomiting  . Zoloft [Sertraline Hcl]     headaches    Current Outpatient Medications  Medication Sig Dispense Refill  . acyclovir ointment (ZOVIRAX) 5 % Apply 1 application topically every 3 (three) hours. As needed. 5 g 5  . cholecalciferol (VITAMIN D3) 25 MCG (1000 UNIT) tablet Take 1,000 Units by mouth daily.    . cyclobenzaprine (FLEXERIL) 10 MG tablet TAKE 1/2 TO 1 TABLET BY MOUTH EVERY DAY AS NEEDED 30 tablet 5  . diazepam (VALIUM) 2 MG tablet Take 1 tablet (2 mg total) by mouth every 12 (twelve) hours as needed for muscle spasms. 20 tablet 0  . escitalopram (LEXAPRO) 10 MG tablet Take 1 tablet (10 mg total) by mouth in the morning and at bedtime. 180  tablet 3  . goserelin (ZOLADEX) 3.6 MG injection Inject 3.6 mg into the skin every 28 (twenty-eight) days.    Marland Kitchen ibuprofen (ADVIL,MOTRIN) 200 MG tablet Take 200 mg by mouth every 6 (six) hours as needed.    Marland Kitchen levonorgestrel (MIRENA) 20 MCG/24HR IUD 1 each by Intrauterine route once. 11/05/12    . levothyroxine (SYNTHROID) 25 MCG tablet Take 1 tablet (25 mcg total) by mouth daily before breakfast. 90 tablet 3  . tamoxifen (NOLVADEX) 20 MG tablet Take 1 tablet (20 mg total) by mouth daily. 90 tablet 4  . Turmeric (QC TUMERIC COMPLEX PO) Take by mouth.    . valACYclovir (VALTREX) 1000 MG tablet Take 2 tablets (2,000 mg total) by mouth 2 (two) times daily as needed. For 1 day. 20 tablet 5   No current facility-administered medications for this visit.    OBJECTIVE: White woman in no acute distress  Vitals:   06/23/20 1427  BP: 128/74  Pulse: 87  Resp: 20  Temp: 97.7 F (36.5 C)  SpO2: 98%     Body mass index is 31.46 kg/m.   Wt Readings from Last 3 Encounters:  06/23/20 177 lb 9.6 oz (80.6 kg)  06/23/20 177 lb (80.3 kg)  06/12/20 178 lb (80.7 kg)      ECOG FS:1 - Symptomatic but completely ambulatory  Sclerae unicteric, EOMs intact Wearing a mask No cervical or supraclavicular adenopathy Lungs no rales or rhonchi Heart regular rate and rhythm Abd soft, nontender, positive bowel sounds MSK no focal spinal tenderness, no upper extremity lymphedema Neuro: nonfocal, well oriented, appropriate affect Breasts: The right breast is status post mastectomy with a silicone implant in place.  The contour is somewhat flat and does not match the left breast, which is status post mastopexy.  There is no evidence of recurrent or residual disease.  Both axillae are benign.  LAB RESULTS:  CMP     Component Value Date/Time   NA 140 06/23/2020 1159   K 4.5 06/23/2020 1159   CL 108 06/23/2020 1159   CO2 25 06/23/2020 1159   GLUCOSE 138 (H) 06/23/2020 1159   BUN 14 06/23/2020 1159    CREATININE 0.75 06/23/2020 1159   CALCIUM 9.2 06/23/2020 1159   PROT 7.5 06/23/2020 1159   ALBUMIN 3.8 06/23/2020 1159   AST 43 (H) 06/23/2020 1159   ALT 40 06/23/2020 1159   ALKPHOS 57 06/23/2020 1159   BILITOT 0.4 06/23/2020 1159  GFRNONAA >60 06/23/2020 1159   GFRAA >60 01/29/2020 1526    No results found for: TOTALPROTELP, ALBUMINELP, A1GS, A2GS, BETS, BETA2SER, GAMS, MSPIKE, SPEI  Lab Results  Component Value Date   WBC 9.2 06/23/2020   NEUTROABS 5.1 06/23/2020   HGB 14.0 06/23/2020   HCT 40.4 06/23/2020   MCV 86.9 06/23/2020   PLT 250 06/23/2020    No results found for: LABCA2  No components found for: LGXQJJ941  No results for input(s): INR in the last 168 hours.  No results found for: LABCA2  No results found for: DEY814  No results found for: GYJ856  No results found for: DJS970  No results found for: CA2729  No components found for: HGQUANT  No results found for: CEA1 / No results found for: CEA1   No results found for: AFPTUMOR  No results found for: CHROMOGRNA  No results found for: KPAFRELGTCHN, LAMBDASER, KAPLAMBRATIO (kappa/lambda light chains)  No results found for: HGBA, HGBA2QUANT, HGBFQUANT, HGBSQUAN (Hemoglobinopathy evaluation)   No results found for: LDH  No results found for: IRON, TIBC, IRONPCTSAT (Iron and TIBC)  No results found for: FERRITIN  Urinalysis    Component Value Date/Time   BILIRUBINUR negative 02/25/2015 1645   PROTEINUR + 0.15 02/25/2015 1645   UROBILINOGEN 0.2 02/25/2015 1645   NITRITE negative 02/25/2015 1645   LEUKOCYTESUR small (1+) (A) 02/25/2015 1645    STUDIES: No results found.   ELIGIBLE FOR AVAILABLE RESEARCH PROTOCOL: AET  ASSESSMENT: 50 y.o. [place] woman status post right breast lower outer quadrant biopsy 01/17/2020  for a clinical T1c N1, stage IB invasive mammary carcinoma, grade 2, estrogen and progesterone receptor positive, HER-2 not amplified  (a) biopsy of the left axillary  "lymph node" was positive but included no lymph node tissue  (b) biopsy of a suspicious periareoral area was positive for ductal carcinoma in situ, grade 1  (c) biopsy of a left breast LOQ mass noted on 02/06/2020 breast MRI pending  (d) CT chest and bone scan 02/11/2020 show no evidence of metastatic disease  (1) genetics testing in process  (2) MammaPrint obtained from the 01/17/2020 biopsy read as "low risk" predicting a 96% chance of distant disease free survival at 5 years without the need for chemotherapy  (3) tamoxifen started 02/13/2020 in anticipation of surgical delays  (a) started ovarian suppression 04/30/2020 with monthly goserelin/Zoladex  (4) status post right modified radical mastectomy 03/30/2020 for a pT3 pN1, stage IIA invasive ductal carcinoma, grade 2, with evidence of lymphovascular space involvement but negative margins  (a) a total of 9 right axillary lymph nodes removed, 2 positive with focal extracapsular extension  (b) status post right breast reconstruction 05/28/2020 with a [place] Smooth Round Ultra High Profile Gel 650 cc. Ref #[id number].  Serial Number [id number]; left mastopexy same date  (5) adjuvant radiation to follow    PLAN: Christy Ferrell has completed her surgery and is ready to start radiation.  She continues on tamoxifen, generally with good tolerance.  She is of course now functionally menopausal because of the goserelin.  She is tolerating that well so far.  She does not think she wants anything for hot flashes at this point.  She will have the Mirena removed in the near future, she tells me  The plan then is to continue tamoxifen for a minimum of 5 years, with monthly goserelin for the same length of time.  She will see me again in about 3 months.  She knows to call for any other issue  that may develop before then  Total encounter time 25 minutes.Sarajane Jews C. Sultan Pargas, MD 06/23/2020 6:16 PM Medical Oncology and Hematology [facility] [place], [place] [address] Tel. [phone]    Fax. [phone]   This document serves as a record of services personally performed by Lurline Del, MD. It was created on his behalf by Wilburn Mylar, a trained medical scribe. The creation of this record is based on the scribe's personal observations and the provider's statements to them.   I, Lurline Del MD, have reviewed the above documentation for accuracy and completeness, and I agree with the above.   *Total Encounter Time as defined by the Centers for Medicare and Medicaid Services includes, in addition to the face-to-face time of a patient visit (documented in the note above) non-face-to-face time: obtaining and reviewing outside history, ordering and reviewing medications, tests or procedures, care coordination (communications with other health care professionals or caregivers) and documentation in the medical record.

## 2020-06-22 NOTE — Therapy (Signed)
[place], [place], [address] Phone: [phone]   Fax:  [phone]  Physical Therapy Treatment  Patient Details  Name: Christy Ferrell MRN: [id number] Date of Birth: 08-05-1970 Referring Provider (PT): Dr. Marlou Starks   Encounter Date: 06/22/2020   PT End of Session - 06/22/20 1609    Visit Number 12    Number of Visits 15    Date for PT Re-Evaluation 07/01/20    PT Start Time 1607    PT Stop Time 1654    PT Time Calculation (min) 47 min    Activity Tolerance Patient tolerated treatment well    Behavior During Therapy [facility] for tasks assessed/performed           Past Medical History:  Diagnosis Date  . Anxiety    h/o, was prev on zoloft  . Breast cancer ([place]) 01/2020   seeing Dr. Marlou Starks  . Cholelithiasis   . Diabetes mellitus without complication ([place])   . Family history of breast cancer   . Family history of leukemia   . Family history of lung cancer   . Family history of prostate cancer   . Family history of stomach cancer   . Hypothyroidism    postpartum hypothyroidism  . IUD (intrauterine device) in place    mirena per [employer]  . Major depression     Past Surgical History:  Procedure Laterality Date  . BREAST RECONSTRUCTION WITH PLACEMENT OF TISSUE EXPANDER AND FLEX HD (ACELLULAR HYDRATED DERMIS) Right 03/30/2020   Procedure: BREAST RECONSTRUCTION WITH PLACEMENT OF TISSUE EXPANDER AND FLEX HD (ACELLULAR HYDRATED DERMIS);  Surgeon: Wallace Going, DO;  Location: [place];  Service: Plastics;  Laterality: Right;  . BREAST REDUCTION WITH MASTOPEXY Left 05/28/2020   Procedure: BREAST REDUCTION WITH MASTOPEXY;  Surgeon: Wallace Going, DO;  Location: [place];  Service: Plastics;  Laterality: Left;  . CESAREAN SECTION     X 2 Fetal stress, second scheduled  . CHOLECYSTECTOMY  05/1998  . MASTECTOMY MODIFIED RADICAL Right 03/30/2020    Procedure: RIGHT MASTECTOMY MODIFIED RADICAL;  Surgeon: Jovita Kussmaul, MD;  Location: [facility];  Service: General;  Laterality: Right;  [place], RNFA  . REMOVAL OF TISSUE EXPANDER AND PLACEMENT OF IMPLANT Right 05/28/2020   Procedure: REMOVAL OF TISSUE EXPANDER AND PLACEMENT OF IMPLANT;  Surgeon: Wallace Going, DO;  Location: [place];  Service: Plastics;  Laterality: Right;  2.5 hours, please    There were no vitals filed for this visit.   Subjective Assessment - 06/22/20 1608    Subjective I saw my doctor and it went well. I can do anything with my arm but I can not lift over 15 lbs.    Pertinent History Rt invasive breast cancer lower inner quadrant and DCIS/ 03/30/20- ER/PR +, HER 2-, ALND (2 of 9 positive), pt will require radiation and no chemo    Patient Stated Goals getting information from providers    Currently in Pain? No/denies    Pain Score 0-No pain              [facility] PT Assessment - 06/22/20 0001      AROM   Right Shoulder Flexion 179 Degrees (P)     Right Shoulder ABduction 177 Degrees (P)     Right Shoulder Internal Rotation 66 Degrees (P)     Right Shoulder External Rotation 90 Degrees (P)  [facility] Adult PT Treatment/Exercise - 06/22/20 0001      Manual Therapy   Myofascial Release to cording in R axilla down lateral trunk also prolonged pressure to trigger points and tightness in axilla    Manual Lymphatic Drainage (MLD) short neck, 5 diaphragmatic breaths, right inguinal LN, Right axillo-inguinal pathway, left axillary nodes and establishment of interaxillary pathway, R breast moving fluid towards pathways then retracing all steps                       PT Long Term Goals - 06/03/20 1454      PT LONG TERM GOAL #1   Title Pt will demonstrate 170 degrees of right shoulder flexion to allow her to reach overhead.    Baseline 141; 06/03/20- not asssessed today bc pt  limited to 90 degrees after surgery last week    Time 4    Period Weeks    Status On-going      PT LONG TERM GOAL #2   Title Pt will demonstrate 170 degrees of right shoulder abduction to allow pt to reach out to the side.    Baseline 99; 06/03/20-06/03/20- not asssessed today bc pt limited to 90 degrees after surgery last week    Time 4    Period Weeks    Status On-going      PT LONG TERM GOAL #3   Title Pt will demonstrate a 75% reduction in swelling inferior to expander to decrease risk of cellulitis.    Baseline 06/03/20- expander removed and implant placed    Time 4    Period Weeks    Status Deferred      PT LONG TERM GOAL #4   Title Pt will report no cording in RUE to allow pt to reach overhead without discomfort.    Baseline 06/03/20- pt still has cording in RUE causing increased tightness and discomfort    Time 4    Period Weeks    Status On-going      PT LONG TERM GOAL #5   Title Pt will be independent in a home exercise program for continued strengthening and stretching.    Time 4    Period Weeks    Status On-going                 Plan - 06/22/20 1659    Clinical Impression Statement Pt is now cleared for full ROM of her left shoulder. Remeasured pt's L shoulder ROM and it is now [facility]. She is still having some tightness at posterior lateral axilla where there is most likely a deep cord. She also still feels fullness in R lateral trunk so performed MLD to this area today.    PT Frequency 2x / week    PT Duration 4 weeks    PT Treatment/Interventions ADLs/Self Care Home Management;Therapeutic exercise;Patient/family education;Manual techniques;Manual lymph drainage;Compression bandaging;Orthotic Fit/Training;Scar mobilization;Passive range of motion;Taping;Vasopneumatic Device    PT Next Visit Plan MLD to R breast, ROM to shoulder height, MFR to cording R axilla with soft tissue work consider teaching strength ABC program prior to discharge    PT Home Exercise Plan  post op    Consulted and Agree with Plan of Care Patient           Patient will benefit from skilled therapeutic intervention in order to improve the following deficits and impairments:  Pain,Postural dysfunction,Impaired UE functional use,Increased fascial restricitons,Decreased range of motion,Decreased scar mobility,Increased edema,Decreased knowledge of precautions  Visit Diagnosis:  Stiffness of right shoulder, not elsewhere classified  Localized edema  Aftercare following surgery for neoplasm     Problem List Patient Active Problem List   Diagnosis Date Noted  . Family history of prostate cancer   . Family history of breast cancer   . Family history of leukemia   . Family history of lung cancer   . Family history of stomach cancer   . Hepatic steatosis 02/13/2020  . Malignant neoplasm of lower-outer quadrant of right breast of female, estrogen receptor positive ([place]) 01/29/2020  . Diabetes mellitus without complication ([place]) 67/54/4920  . Abnormal thyroid stimulating hormone (TSH) level 09/14/2016  . Hyperglycemia 09/14/2016  . Fever blister 09/14/2016  . Advance care planning 09/14/2016  . Cough 10/06/2014  . Headache(784.0) 06/05/2012  . Shoulder pain 01/18/2012  . Anxiety 02/18/2011  . Routine general medical examination at a health care facility 09/03/2010  . Hypothyroidism 12/14/2006    Allyson Sabal [facility] 06/22/2020, 5:05 PM  [place], [place], [address] Phone: [phone]   Fax:  [phone]  Name: Christy Ferrell MRN: [id number] Date of Birth: Jun 10, 1970  Manus Gunning, PT 06/22/20 5:05 PM

## 2020-06-23 ENCOUNTER — Other Ambulatory Visit: Payer: Self-pay

## 2020-06-23 ENCOUNTER — Encounter: Payer: Self-pay | Admitting: Genetic Counselor

## 2020-06-23 ENCOUNTER — Ambulatory Visit
Admission: RE | Admit: 2020-06-23 | Discharge: 2020-06-23 | Disposition: A | Discharge status: Home / Self Care | Payer: No Typology Code available for payment source | Source: Ambulatory Visit | Attending: Radiation Oncology | Admitting: Radiation Oncology

## 2020-06-23 ENCOUNTER — Encounter: Payer: Self-pay | Admitting: Radiation Oncology

## 2020-06-23 ENCOUNTER — Inpatient Hospital Stay (HOSPITAL_BASED_OUTPATIENT_CLINIC_OR_DEPARTMENT_OTHER): Payer: No Typology Code available for payment source | Admitting: Oncology

## 2020-06-23 ENCOUNTER — Other Ambulatory Visit: Payer: Self-pay | Admitting: *Deleted

## 2020-06-23 ENCOUNTER — Inpatient Hospital Stay: Payer: No Typology Code available for payment source

## 2020-06-23 VITALS — BP 128/74 | HR 87 | Temp 97.7°F | Resp 20 | Ht 63.0 in | Wt 177.6 lb

## 2020-06-23 VITALS — BP 127/87 | HR 76 | Temp 97.8°F | Resp 18 | Ht 63.0 in | Wt 177.0 lb

## 2020-06-23 DIAGNOSIS — Z9221 Personal history of antineoplastic chemotherapy: Secondary | ICD-10-CM | POA: Insufficient documentation

## 2020-06-23 DIAGNOSIS — E119 Type 2 diabetes mellitus without complications: Secondary | ICD-10-CM | POA: Insufficient documentation

## 2020-06-23 DIAGNOSIS — Z801 Family history of malignant neoplasm of trachea, bronchus and lung: Secondary | ICD-10-CM | POA: Insufficient documentation

## 2020-06-23 DIAGNOSIS — Z79810 Long term (current) use of selective estrogen receptor modulators (SERMs): Secondary | ICD-10-CM | POA: Insufficient documentation

## 2020-06-23 DIAGNOSIS — E039 Hypothyroidism, unspecified: Secondary | ICD-10-CM | POA: Insufficient documentation

## 2020-06-23 DIAGNOSIS — F418 Other specified anxiety disorders: Secondary | ICD-10-CM | POA: Insufficient documentation

## 2020-06-23 DIAGNOSIS — Z170 Estrogen receptor positive status [ER+]: Secondary | ICD-10-CM

## 2020-06-23 DIAGNOSIS — C50511 Malignant neoplasm of lower-outer quadrant of right female breast: Secondary | ICD-10-CM

## 2020-06-23 DIAGNOSIS — Z806 Family history of leukemia: Secondary | ICD-10-CM | POA: Insufficient documentation

## 2020-06-23 DIAGNOSIS — Z510 Encounter for antineoplastic radiation therapy: Secondary | ICD-10-CM | POA: Insufficient documentation

## 2020-06-23 DIAGNOSIS — Z803 Family history of malignant neoplasm of breast: Secondary | ICD-10-CM | POA: Insufficient documentation

## 2020-06-23 DIAGNOSIS — Z9011 Acquired absence of right breast and nipple: Secondary | ICD-10-CM | POA: Insufficient documentation

## 2020-06-23 DIAGNOSIS — R232 Flushing: Secondary | ICD-10-CM | POA: Insufficient documentation

## 2020-06-23 DIAGNOSIS — Z923 Personal history of irradiation: Secondary | ICD-10-CM | POA: Insufficient documentation

## 2020-06-23 DIAGNOSIS — Z1379 Encounter for other screening for genetic and chromosomal anomalies: Secondary | ICD-10-CM | POA: Insufficient documentation

## 2020-06-23 LAB — CMP (CANCER CENTER ONLY)
ALT: 40 U/L (ref 0–44)
AST: 43 U/L — ABNORMAL HIGH (ref 15–41)
Albumin: 3.8 g/dL (ref 3.5–5.0)
Alkaline Phosphatase: 57 U/L (ref 38–126)
Anion gap: 7 (ref 5–15)
BUN: 14 mg/dL (ref 6–20)
CO2: 25 mmol/L (ref 22–32)
Calcium: 9.2 mg/dL (ref 8.9–10.3)
Chloride: 108 mmol/L (ref 98–111)
Creatinine: 0.75 mg/dL (ref 0.44–1.00)
GFR, Estimated: >60 mL/min (ref >60)
Glucose, Bld: 138 mg/dL — ABNORMAL HIGH (ref 70–99)
Potassium: 4.5 mmol/L (ref 3.5–5.1)
Sodium: 140 mmol/L (ref 135–145)
Total Bilirubin: 0.4 mg/dL (ref 0.3–1.2)
Total Protein: 7.5 g/dL (ref 6.5–8.1)

## 2020-06-23 LAB — CBC WITH DIFFERENTIAL (CANCER CENTER ONLY)
Abs Immature Granulocytes: 0.03 10*3/uL (ref 0.00–0.07)
Basophils Absolute: 0 10*3/uL (ref 0.0–0.1)
Basophils Relative: 0 %
Eosinophils Absolute: 0.1 10*3/uL (ref 0.0–0.5)
Eosinophils Relative: 1 %
HCT: 40.4 % (ref 36.0–46.0)
Hemoglobin: 14 g/dL (ref 12.0–15.0)
Immature Granulocytes: 0 %
Lymphocytes Relative: 39 %
Lymphs Abs: 3.6 10*3/uL (ref 0.7–4.0)
MCH: 30.1 pg (ref 26.0–34.0)
MCHC: 34.7 g/dL (ref 30.0–36.0)
MCV: 86.9 fL (ref 80.0–100.0)
Monocytes Absolute: 0.5 10*3/uL (ref 0.1–1.0)
Monocytes Relative: 5 %
Neutro Abs: 5.1 10*3/uL (ref 1.7–7.7)
Neutrophils Relative %: 55 %
Platelet Count: 250 10*3/uL (ref 150–400)
RBC: 4.65 MIL/uL (ref 3.87–5.11)
RDW: 13.2 % (ref 11.5–15.5)
WBC Count: 9.2 10*3/uL (ref 4.0–10.5)
nRBC: 0 % (ref 0.0–0.2)

## 2020-06-23 LAB — PREGNANCY, URINE: Preg Test, Ur: NEGATIVE

## 2020-06-23 MED ORDER — GOSERELIN ACETATE 3.6 MG ~~LOC~~ IMPL
3.6000 mg | DRUG_IMPLANT | Freq: Once | SUBCUTANEOUS | Status: AC
  Administered 2020-06-23: 3.6 mg via SUBCUTANEOUS

## 2020-06-23 MED ORDER — GOSERELIN ACETATE 3.6 MG ~~LOC~~ IMPL
DRUG_IMPLANT | SUBCUTANEOUS | Status: AC
  Filled 2020-06-23: qty 3.6

## 2020-06-23 MED ORDER — TAMOXIFEN CITRATE 20 MG PO TABS: 20.0000 mg | ORAL_TABLET | Freq: Every day | ORAL | 4 refills | Status: DC

## 2020-06-23 NOTE — Progress Notes (Signed)
Radiation Oncology         [phone] ________________________________  Initial Outpatient Consultation  Name: Christy Ferrell MRN: [id number]  Date: 06/23/2020  DOB: [birth year]  RD:EYCXKG, Elveria Rising, MD  Magrinat, Virgie Dad, MD   REFERRING PHYSICIAN: Magrinat, Virgie Dad, MD  DIAGNOSIS:    ICD-10-CM   1. Malignant neoplasm of lower-outer quadrant of right breast of female, estrogen receptor positive ([place])  C50.511    Z17.0     pT3, pN1a, M0 Right Breast LIQ Invasive Ductal Carcinoma with DCIS, ER+ / PR+ / Her2-, Grade 2  CHIEF COMPLAINT: Here to discuss management of right breast cancer  HISTORY OF PRESENT ILLNESS::Christy Ferrell is a 50 y.o. female who presented with right breast abnormality on the following imaging: bilateral diagnostic mammogram on the date of 01/15/2020. Symptoms, if any, at that time, were: palpable right breast mass. Ultrasound of the right breast on 01/15/2020 revealed a suspicious mass in the 5:30 region of the right breast 1-2 cm from the nipple with adjacent 5 mm probable satellite mass, a suspicious mass in the 5:30 retroareolar region of the right breast, and two enlarged lymph nodes in the right breast at the 9:30 position, 11 cm from the nipple. Biopsy on the date of 01/17/2020 showed invasive mammary carcinoma (5:30 o'clock mass right axillary lymph node) and ductal carcinoma in-situ (retroareolar). ER status: 95% strong; PR status: 95% strong; Her2 status: negative; Grade: 2.  MammaPrint on the above pathology was low risk, predicting a 96% chance of disease free survival for 5 years without the need for chemotherapy.  The patient underwent the following significant imaging studies: 1. MRI of bilateral breasts on 02/06/2020 that showed a 1.5 x 1.4 x 1.1 cm biopsy-proven invasive mammary carcinoma in the 5:30 o'clock position of the right breast in the middle depth. There was also a non-mass enhancement extending anterior and posterior to the biopsy-proven  invasive mammary carcinoma in the 5:30 o'clock position of the right breast that extended from the retroareolar region to the posterior aspect of the breast, spanning 8.3 cm. Additionally, there was a combined area of malignancy in the lower inner quadrant of the right breast that spanned 8.3 x 2.4 x 1.8 cm, a biopsy-proven metastatic lymph node in the inferior right axilla in the axillary tail region of the right breast, and an additional smaller intramammary lymph node that was 3.4 cm anterior and lateral to the biopsy-proven malignant lymph node with imaging features compatible with an additional metastatic node. Finally, there was a 0.5 x 0.3 x 0.3 cm irregular mass in the anterior aspect of the lower inner quadrant of the left breast that was concerning for a small malignancy. 2. CT scan of chest on 02/11/2020 that showed masses of the right breast and an enlarged right axillary lymph node containing biopsy marking clips, in keeping with new diagnosis of breast cancer. There were no other enlarged lymph nodes or evidence of metastatic disease in the chest. 3. Bone scan on 02/11/2020 that showed no scintigraphic evidence of osseous metastatic disease. 4. MRI of the left breast on 02/19/2020 that was not able to visualize the lower inner left breast mass that was identified on the above MRI.  Right Mastectomy and lymph node dissection on the date of 03/30/2020 revealed: tumor size >5 cm. Histology of invasive ductal carcinoma and ductal carcinoma in-situ. Margin status of negative. Lymphovascular invasion was present. Nodal status of positive for metastatic carcinoma in two of nine lymph nodes. ER status: 95%  strong; PR status: 95% strong; Her2 status: negative; Grade: 2.  She underwent exchange of right tissue expander for silicone implant on 05/28/2020 with Dr. Ulice Bold.  MammaPrint was low risk.   She is undergoing ovarian suppressive treatments with monthly goserelin and Zoladex   Symptomatically,  the patient reports that she does not have any lymphedema.  She is undergoing physical therapy twice a week.  She has good range of motion in her shoulders.  She has some tenderness from physical therapy that is manageable.  She denies current pregnancy.  PREVIOUS RADIATION THERAPY: No  PAST MEDICAL HISTORY:  has a past medical history of Anxiety, Breast cancer (HCC) (01/2020), Cholelithiasis, Diabetes mellitus without complication (HCC), Family history of breast cancer, Family history of leukemia, Family history of lung cancer, Family history of prostate cancer, Family history of stomach cancer, Hypothyroidism, IUD (intrauterine device) in place, and Major depression.    PAST SURGICAL HISTORY: Past Surgical History:  Procedure Laterality Date  . BREAST RECONSTRUCTION WITH PLACEMENT OF TISSUE EXPANDER AND FLEX HD (ACELLULAR HYDRATED DERMIS) Right 03/30/2020   Procedure: BREAST RECONSTRUCTION WITH PLACEMENT OF TISSUE EXPANDER AND FLEX HD (ACELLULAR HYDRATED DERMIS);  Surgeon: Peggye Form, DO;  Location: [facility];  Service: Plastics;  Laterality: Right;  . BREAST REDUCTION WITH MASTOPEXY Left 05/28/2020   Procedure: BREAST REDUCTION WITH MASTOPEXY;  Surgeon: Peggye Form, DO;  Location: [facility];  Service: Plastics;  Laterality: Left;  . CESAREAN SECTION     X 2 Fetal stress, second scheduled  . CHOLECYSTECTOMY  05/1998  . MASTECTOMY MODIFIED RADICAL Right 03/30/2020   Procedure: RIGHT MASTECTOMY MODIFIED RADICAL;  Surgeon: Griselda Miner, MD;  Location: [facility];  Service: General;  Laterality: Right;  PEC BLOCK, RNFA  . REMOVAL OF TISSUE EXPANDER AND PLACEMENT OF IMPLANT Right 05/28/2020   Procedure: REMOVAL OF TISSUE EXPANDER AND PLACEMENT OF IMPLANT;  Surgeon: Peggye Form, DO;  Location: [facility];  Service: Plastics;  Laterality: Right;  2.5 hours, please    FAMILY HISTORY: family history includes  Asthma in her sister; Breast cancer (age of onset: 42) in an other family member; Cancer in her maternal grandfather; Depression in her father; Diabetes in her maternal uncle and maternal uncle; Emphysema in her paternal grandmother; Hyperlipidemia in her maternal grandmother; Hypertension in her father and maternal grandmother; Hypothyroidism in her maternal grandfather and mother; Leukemia (age of onset: 72) in her maternal uncle; Lung cancer (age of onset: 22) in her maternal grandmother; Lung disease in her maternal grandmother; Prostate cancer in her maternal grandfather and maternal uncle; Prostate cancer (age of onset: 42) in her father; Spina bifida in her sister.  SOCIAL HISTORY:  reports that she has never smoked. She has never used smokeless tobacco. She reports current alcohol use. She reports that she does not use drugs.  ALLERGIES: Codeine and Zoloft [sertraline hcl]  MEDICATIONS:  Current Outpatient Medications  Medication Sig Dispense Refill  . acyclovir ointment (ZOVIRAX) 5 % Apply 1 application topically every 3 (three) hours. As needed. 5 g 5  . cholecalciferol (VITAMIN D3) 25 MCG (1000 UNIT) tablet Take 1,000 Units by mouth daily.    . cyclobenzaprine (FLEXERIL) 10 MG tablet TAKE 1/2 TO 1 TABLET BY MOUTH EVERY DAY AS NEEDED 30 tablet 5  . diazepam (VALIUM) 2 MG tablet Take 1 tablet (2 mg total) by mouth every 12 (twelve) hours as needed for muscle spasms. 20 tablet 0  . escitalopram (LEXAPRO)  10 MG tablet Take 1 tablet (10 mg total) by mouth in the morning and at bedtime. 180 tablet 3  . goserelin (ZOLADEX) 3.6 MG injection Inject 3.6 mg into the skin every 28 (twenty-eight) days.    Marland Kitchen ibuprofen (ADVIL,MOTRIN) 200 MG tablet Take 200 mg by mouth every 6 (six) hours as needed.    Marland Kitchen levonorgestrel (MIRENA) 20 MCG/24HR IUD 1 each by Intrauterine route once. 11/05/12    . levothyroxine (SYNTHROID) 25 MCG tablet Take 1 tablet (25 mcg total) by mouth daily before breakfast. 90 tablet 3   . Turmeric (QC TUMERIC COMPLEX PO) Take by mouth.    . valACYclovir (VALTREX) 1000 MG tablet Take 2 tablets (2,000 mg total) by mouth 2 (two) times daily as needed. For 1 day. 20 tablet 5  . tamoxifen (NOLVADEX) 20 MG tablet Take 1 tablet (20 mg total) by mouth daily. 90 tablet 4   No current facility-administered medications for this encounter.    REVIEW OF SYSTEMS: As Above  PHYSICAL EXAM:  height is $RemoveB'5\' 3"'IgyCtuFh$  (1.6 m) and weight is 177 lb (80.3 kg). Her temperature is 97.8 F (36.6 C). Her blood pressure is 127/87 and her pulse is 76. Her respiration is 18 and oxygen saturation is 98%.   General: Alert and oriented, in no acute distress Psychiatric: Judgment and insight are intact. Affect is appropriate. Breasts: Status post left breast mastopexy, scars healing satisfactorily.  Status post right mastectomy with satisfactory healing of scar.  Implant in place on right side.  No sign of local or regional recurrence   ECOG = 0  0 - Asymptomatic (Fully active, able to carry on all predisease activities without restriction)  1 - Symptomatic but completely ambulatory (Restricted in physically strenuous activity but ambulatory and able to carry out work of a light or sedentary nature. For example, light housework, office work)  2 - Symptomatic, <50% in bed during the day (Ambulatory and capable of all self care but unable to carry out any work activities. Up and about more than 50% of waking hours)  3 - Symptomatic, >50% in bed, but not bedbound (Capable of only limited self-care, confined to bed or chair 50% or more of waking hours)  4 - Bedbound (Completely disabled. Cannot carry on any self-care. Totally confined to bed or chair)  5 - Death   Eustace Pen MM, Creech RH, Tormey DC, et al. [phone]). "Toxicity and response criteria of the [facility]". [place]. 5 (6): 649-55   LABORATORY DATA:  Lab Results  Component Value Date   WBC 9.2 06/23/2020   HGB 14.0  06/23/2020   HCT 40.4 06/23/2020   MCV 86.9 06/23/2020   PLT 250 06/23/2020   CMP     Component Value Date/Time   NA 140 06/23/2020 1159   K 4.5 06/23/2020 1159   CL 108 06/23/2020 1159   CO2 25 06/23/2020 1159   GLUCOSE 138 (H) 06/23/2020 1159   BUN 14 06/23/2020 1159   CREATININE 0.75 06/23/2020 1159   CALCIUM 9.2 06/23/2020 1159   PROT 7.5 06/23/2020 1159   ALBUMIN 3.8 06/23/2020 1159   AST 43 (H) 06/23/2020 1159   ALT 40 06/23/2020 1159   ALKPHOS 57 06/23/2020 1159   BILITOT 0.4 06/23/2020 1159   GFRNONAA >60 06/23/2020 1159   GFRAA >60 01/29/2020 1526      Urine pregnancy test negative today   RADIOGRAPHY: As above    IMPRESSION/PLAN: Locally advanced Right breast cancer  It  was a pleasure meeting the patient today. We discussed the risks, benefits, and side effects of radiotherapy. I recommend radiotherapy to the right reconstructed breast and regional nodes to reduce her risk of locoregional recurrence by 2/3.  We discussed that radiation would take approximately 6-1/2 to 7 weeks to complete. We spoke about acute effects including skin irritation and fatigue as well  late effects including cosmetic changes, scar tissue, lymphedema , internal organ injury or irritation. We spoke about the latest technology that is used to minimize the risk of late effects for patients undergoing radiotherapy to the breast or chest wall. No guarantees of treatment were given. The patient is enthusiastic about proceeding with treatment. I look forward to participating in the patient's care.   I have placed orders for treatment planning.  We will contact her in the near future to arrange CT simulation appointment.  Consent form has been signed and placed in her chart.  We discussed measures to reduce the risk of infection during the COVID-19 pandemic.  She has not yet received vaccinations.  I talked to her about why I strongly recommend that she get vaccinated, particularly to reduce the risk  of hospitalization and death from Covid.  At this time she wishes to think about her options further before she makes a final decision.  I gave her our contact information so that she can let us know when she makes her decision.  On date of service, in total, I spent 30 minutes on this encounter. Patient was seen in person.   __________________________________________   Eppie Gibson, MD  This document serves as a record of services personally performed by Eppie Gibson, MD. It was created on his behalf by Clerance Lav, a trained medical scribe. The creation of this record is based on the scribe's personal observations and the provider's statements to them. This document has been checked and approved by the attending provider.

## 2020-06-24 ENCOUNTER — Telehealth: Payer: Self-pay | Admitting: Oncology

## 2020-06-24 NOTE — Telephone Encounter (Signed)
Scheduled appts per 2/8 los. Left voicemail with appt date and time.

## 2020-06-25 ENCOUNTER — Other Ambulatory Visit: Payer: Self-pay

## 2020-06-25 ENCOUNTER — Ambulatory Visit: Payer: No Typology Code available for payment source | Admitting: Physical Therapy

## 2020-06-25 ENCOUNTER — Encounter: Payer: Self-pay | Admitting: Physical Therapy

## 2020-06-25 DIAGNOSIS — R600 Localized edema: Secondary | ICD-10-CM

## 2020-06-25 DIAGNOSIS — Z483 Aftercare following surgery for neoplasm: Secondary | ICD-10-CM

## 2020-06-25 DIAGNOSIS — M25611 Stiffness of right shoulder, not elsewhere classified: Secondary | ICD-10-CM

## 2020-06-25 NOTE — Therapy (Signed)
[place], [place], [address] Phone: [phone]   Fax:  [phone]  Physical Therapy Treatment  Patient Details  Name: Christy Ferrell MRN: [id number] Date of Birth: [birth year] Referring Provider (PT): Dr. Marlou Starks   Encounter Date: 06/25/2020   PT End of Session - 06/25/20 1556    Visit Number 13    Number of Visits 15    Date for PT Re-Evaluation 07/01/20    PT Start Time 1502    PT Stop Time 1556    PT Time Calculation (min) 54 min    Activity Tolerance Patient tolerated treatment well    Behavior During Therapy [facility] for tasks assessed/performed           Past Medical History:  Diagnosis Date  . Anxiety    h/o, was prev on zoloft  . Breast cancer ([place]) 01/2020   seeing Dr. Marlou Starks  . Cholelithiasis   . Diabetes mellitus without complication ([place])   . Family history of breast cancer   . Family history of leukemia   . Family history of lung cancer   . Family history of prostate cancer   . Family history of stomach cancer   . Hypothyroidism    postpartum hypothyroidism  . IUD (intrauterine device) in place    mirena per [employer]  . Major depression     Past Surgical History:  Procedure Laterality Date  . BREAST RECONSTRUCTION WITH PLACEMENT OF TISSUE EXPANDER AND FLEX HD (ACELLULAR HYDRATED DERMIS) Right 03/30/2020   Procedure: BREAST RECONSTRUCTION WITH PLACEMENT OF TISSUE EXPANDER AND FLEX HD (ACELLULAR HYDRATED DERMIS);  Surgeon: Wallace Going, DO;  Location: [place];  Service: Plastics;  Laterality: Right;  . BREAST REDUCTION WITH MASTOPEXY Left 05/28/2020   Procedure: BREAST REDUCTION WITH MASTOPEXY;  Surgeon: Wallace Going, DO;  Location: [place];  Service: Plastics;  Laterality: Left;  . CESAREAN SECTION     X 2 Fetal stress, second scheduled  . CHOLECYSTECTOMY  05/1998  . MASTECTOMY MODIFIED RADICAL Right 03/30/2020    Procedure: RIGHT MASTECTOMY MODIFIED RADICAL;  Surgeon: Jovita Kussmaul, MD;  Location: [place];  Service: General;  Laterality: Right;  [place], RNFA  . REMOVAL OF TISSUE EXPANDER AND PLACEMENT OF IMPLANT Right 05/28/2020   Procedure: REMOVAL OF TISSUE EXPANDER AND PLACEMENT OF IMPLANT;  Surgeon: Wallace Going, DO;  Location: [place];  Service: Plastics;  Laterality: Right;  2.5 hours, please    There were no vitals filed for this visit.   Subjective Assessment - 06/25/20 1512    Subjective My cording is getting better I have been doing the stretch you told me about last time.    Pertinent History Rt invasive breast cancer lower inner quadrant and DCIS/ 03/30/20- ER/PR +, HER 2-, ALND (2 of 9 positive), pt will require radiation and no chemo    Patient Stated Goals getting information from providers    Currently in Pain? No/denies    Pain Score 0-No pain                             [facility] Adult PT Treatment/Exercise - 06/25/20 0001      Manual Therapy   Myofascial Release to cording in R posterior axilla down R lateral trunk    Manual Lymphatic Drainage (MLD) short neck, 5 diaphragmatic breaths, right inguinal LN, Right axillo-inguinal pathway, left  axillary nodes and establishment of interaxillary pathway, R breast moving fluid towards pathways then retracing all steps                       PT Long Term Goals - 06/03/20 1454      PT LONG TERM GOAL #1   Title Pt will demonstrate 170 degrees of right shoulder flexion to allow her to reach overhead.    Baseline 141; 06/03/20- not asssessed today bc pt limited to 90 degrees after surgery last week    Time 4    Period Weeks    Status On-going      PT LONG TERM GOAL #2   Title Pt will demonstrate 170 degrees of right shoulder abduction to allow pt to reach out to the side.    Baseline 99; 06/03/20-06/03/20- not asssessed today bc pt limited to 90 degrees after  surgery last week    Time 4    Period Weeks    Status On-going      PT LONG TERM GOAL #3   Title Pt will demonstrate a 75% reduction in swelling inferior to expander to decrease risk of cellulitis.    Baseline 06/03/20- expander removed and implant placed    Time 4    Period Weeks    Status Deferred      PT LONG TERM GOAL #4   Title Pt will report no cording in RUE to allow pt to reach overhead without discomfort.    Baseline 06/03/20- pt still has cording in RUE causing increased tightness and discomfort    Time 4    Period Weeks    Status On-going      PT LONG TERM GOAL #5   Title Pt will be independent in a home exercise program for continued strengthening and stretching.    Time 4    Period Weeks    Status On-going                 Plan - 06/25/20 1556    Clinical Impression Statement Pt feels her swelling is improving and her bra does not fit as tightly as it did. Overall her cording is improving but she still has a cord down right posterior UE to trunk that causes tightness so focused on myofascial release to this area. Pt will be starting radiation soon. Will continue to work on decreasing cording and tightness at posterior axilla in preparation for pt starting radiation since radiation can make tightness worse.    PT Frequency 2x / week    PT Duration 4 weeks    PT Treatment/Interventions ADLs/Self Care Home Management;Therapeutic exercise;Patient/family education;Manual techniques;Manual lymph drainage;Compression bandaging;Orthotic Fit/Training;Scar mobilization;Passive range of motion;Taping;Vasopneumatic Device    PT Next Visit Plan MLD to R breast, ROM to shoulder height, MFR to cording R axilla with soft tissue work consider teaching strength ABC program prior to discharge    PT Home Exercise Plan post op    Consulted and Agree with Plan of Care Patient           Patient will benefit from skilled therapeutic intervention in order to improve the following  deficits and impairments:  Pain,Postural dysfunction,Impaired UE functional use,Increased fascial restricitons,Decreased range of motion,Decreased scar mobility,Increased edema,Decreased knowledge of precautions  Visit Diagnosis: Stiffness of right shoulder, not elsewhere classified  Localized edema  Aftercare following surgery for neoplasm     Problem List Patient Active Problem List   Diagnosis Date Noted  . Genetic testing  06/23/2020  . Family history of prostate cancer   . Family history of breast cancer   . Family history of leukemia   . Family history of lung cancer   . Family history of stomach cancer   . Hepatic steatosis 02/13/2020  . Malignant neoplasm of lower-outer quadrant of right breast of female, estrogen receptor positive ([place]) 01/29/2020  . Diabetes mellitus without complication ([place]) 90/47/5339  . Abnormal thyroid stimulating hormone (TSH) level 09/14/2016  . Hyperglycemia 09/14/2016  . Fever blister 09/14/2016  . Advance care planning 09/14/2016  . Cough 10/06/2014  . Headache(784.0) 06/05/2012  . Shoulder pain 01/18/2012  . Anxiety 02/18/2011  . Routine general medical examination at a health care facility 09/03/2010  . Hypothyroidism 12/14/2006    Allyson Sabal [facility] 06/25/2020, 3:59 PM  [place], [place], [address] Phone: [phone]   Fax:  [phone]  Name: Christy Ferrell MRN: [id number] Date of Birth: [birth year]  Manus Gunning, PT 06/25/20 3:59 PM

## 2020-06-29 ENCOUNTER — Ambulatory Visit
Admission: RE | Admit: 2020-06-29 | Discharge: 2020-06-29 | Disposition: A | Discharge status: Home / Self Care | Payer: No Typology Code available for payment source | Source: Ambulatory Visit | Attending: Radiation Oncology | Admitting: Radiation Oncology

## 2020-06-29 ENCOUNTER — Encounter: Payer: Self-pay | Admitting: *Deleted

## 2020-06-29 ENCOUNTER — Other Ambulatory Visit: Payer: Self-pay

## 2020-06-29 DIAGNOSIS — Z510 Encounter for antineoplastic radiation therapy: Secondary | ICD-10-CM | POA: Diagnosis not present

## 2020-06-30 ENCOUNTER — Ambulatory Visit: Payer: No Typology Code available for payment source | Admitting: Physical Therapy

## 2020-06-30 ENCOUNTER — Encounter: Payer: Self-pay | Admitting: Physical Therapy

## 2020-06-30 DIAGNOSIS — R600 Localized edema: Secondary | ICD-10-CM

## 2020-06-30 DIAGNOSIS — M25611 Stiffness of right shoulder, not elsewhere classified: Secondary | ICD-10-CM | POA: Diagnosis not present

## 2020-06-30 NOTE — Patient Instructions (Signed)
Over Head Pull: Narrow and Wide Grip   Cancer Rehab [phone]   On back, knees bent, feet flat, band across thighs, elbows straight but relaxed. Pull hands apart (start). Keeping elbows straight, bring arms up and over head, hands toward floor. Keep pull steady on band. Hold momentarily. Return slowly, keeping pull steady, back to start. Then do same with a wider grip on the band (past shoulder width) Repeat _10__ times. Band color __yellow____   Side Pull: Double Arm   On back, knees bent, feet flat. Arms perpendicular to body, shoulder level, elbows straight but relaxed. Pull arms out to sides, elbows straight. Resistance band comes across collarbones, hands toward floor. Hold momentarily. Slowly return to starting position. Repeat _10__ times. Band color _yellow____   Sword   On back, knees bent, feet flat, left hand on left hip, right hand above left. Pull right arm DIAGONALLY (hip to shoulder) across chest. Bring right arm along head toward floor. Thumb is pointed down when by hip and rotates upwards when by head.  Hold momentarily. Slowly return to starting position. Repeat _10__ times. Do with left arm. Then repeat on right side. Band color _yellow_____   Shoulder Rotation: Double Arm   On back, knees bent, feet flat, elbows tucked at sides, bent 90, hands palms up. Pull hands apart and down toward floor, keeping elbows near sides. Hold momentarily. Slowly return to starting position. Repeat _10__ times. Band color __yellow____

## 2020-06-30 NOTE — Therapy (Signed)
[place], [place], [address] Phone: [phone]   Fax:  [phone]  Physical Therapy Treatment  Patient Details  Name: Christy Ferrell MRN: [id number] Date of Birth: 04-01-1971 Referring Provider (PT): Dr. Marlou Starks   Encounter Date: 06/30/2020   PT End of Session - 06/30/20 1555    Visit Number 14    Number of Visits 15    Date for PT Re-Evaluation 07/01/20    PT Start Time 1506    PT Stop Time 1555    PT Time Calculation (min) 49 min    Activity Tolerance Patient tolerated treatment well    Behavior During Therapy [facility] for tasks assessed/performed           Past Medical History:  Diagnosis Date  . Anxiety    h/o, was prev on zoloft  . Breast cancer ([place]) 01/2020   seeing Dr. Marlou Starks  . Cholelithiasis   . Diabetes mellitus without complication ([place])   . Family history of breast cancer   . Family history of leukemia   . Family history of lung cancer   . Family history of prostate cancer   . Family history of stomach cancer   . Hypothyroidism    postpartum hypothyroidism  . IUD (intrauterine device) in place    mirena per [employer]  . Major depression     Past Surgical History:  Procedure Laterality Date  . BREAST RECONSTRUCTION WITH PLACEMENT OF TISSUE EXPANDER AND FLEX HD (ACELLULAR HYDRATED DERMIS) Right 03/30/2020   Procedure: BREAST RECONSTRUCTION WITH PLACEMENT OF TISSUE EXPANDER AND FLEX HD (ACELLULAR HYDRATED DERMIS);  Surgeon: Wallace Going, DO;  Location: [facility];  Service: Plastics;  Laterality: Right;  . BREAST REDUCTION WITH MASTOPEXY Left 05/28/2020   Procedure: BREAST REDUCTION WITH MASTOPEXY;  Surgeon: Wallace Going, DO;  Location: [facility];  Service: Plastics;  Laterality: Left;  . CESAREAN SECTION     X 2 Fetal stress, second scheduled  . CHOLECYSTECTOMY  05/1998  . MASTECTOMY MODIFIED RADICAL Right 03/30/2020    Procedure: RIGHT MASTECTOMY MODIFIED RADICAL;  Surgeon: Jovita Kussmaul, MD;  Location: [facility];  Service: General;  Laterality: Right;  [place], RNFA  . REMOVAL OF TISSUE EXPANDER AND PLACEMENT OF IMPLANT Right 05/28/2020   Procedure: REMOVAL OF TISSUE EXPANDER AND PLACEMENT OF IMPLANT;  Surgeon: Wallace Going, DO;  Location: [facility];  Service: Plastics;  Laterality: Right;  2.5 hours, please    There were no vitals filed for this visit.   Subjective Assessment - 06/30/20 1506    Subjective I think I hve a little swelling but I think it is because I am numb that it feels swollen. I still have tightness in some different directions. I start radiation on the 22nd.    Pertinent History Rt invasive breast cancer lower inner quadrant and DCIS/ 03/30/20- ER/PR +, HER 2-, ALND (2 of 9 positive), pt will require radiation and no chemo    Patient Stated Goals getting information from providers    Currently in Pain? No/denies    Pain Score 0-No pain                             [facility] Adult PT Treatment/Exercise - 06/30/20 0001      Shoulder Exercises: Supine   Horizontal ABduction Strengthening;Both;10 reps;Theraband   pt returned therapist demo   Theraband  Level (Shoulder Horizontal ABduction) Level 1 (Yellow)    External Rotation Strengthening;Both;10 reps;Theraband   pt returned therapist demo   Theraband Level (Shoulder External Rotation) Level 1 (Yellow)    Flexion Strengthening;Both;10 reps;Theraband   narrow and wide grip; pt returned therapist demo   Theraband Level (Shoulder Flexion) Level 1 (Yellow)    Diagonals Strengthening;Both;10 reps;Theraband   pt returned therapist demo   Theraband Level (Shoulder Diagonals) Level 1 (Yellow)      Manual Therapy   Myofascial Release to cording in R posterior axilla down R lateral trunk    Manual Lymphatic Drainage (MLD) short neck, 5 diaphragmatic breaths, right inguinal LN, Right  axillo-inguinal pathway, left axillary nodes and establishment of interaxillary pathway, R breast moving fluid towards pathways then retracing all steps                       PT Long Term Goals - 06/30/20 1509      PT LONG TERM GOAL #1   Title Pt will demonstrate 170 degrees of right shoulder flexion to allow her to reach overhead.    Baseline 141; 06/03/20- not asssessed today bc pt limited to 90 degrees after surgery last week; 06/30/20- 170    Time 4    Period Weeks    Status Achieved      PT LONG TERM GOAL #2   Title Pt will demonstrate 170 degrees of right shoulder abduction to allow pt to reach out to the side.    Baseline 99; 06/03/20-06/03/20- not asssessed today bc pt limited to 90 degrees after surgery last week; 06/30/20- 175    Time 4    Period Weeks    Status Achieved      PT LONG TERM GOAL #3   Title Pt will demonstrate a 75% reduction in swelling inferior to expander to decrease risk of cellulitis.    Baseline 06/03/20- expander removed and implant placed    Time 4    Period Weeks    Status Achieved      PT LONG TERM GOAL #4   Title Pt will report no cording in RUE to allow pt to reach overhead without discomfort.    Baseline 06/03/20- pt still has cording in RUE causing increased tightness and discomfort; 06/30/20- pt is able to do this now    Time 4    Period Weeks    Status Achieved      PT LONG TERM GOAL #5   Title Pt will be independent in a home exercise program for continued strengthening and stretching.    Time 4    Period Weeks    Status On-going                 Plan - 06/30/20 1556    Clinical Impression Statement Assessed pt's progress towards goals in therapy. Pt is progressing towards her goals and has met her ROM goals and is no longer having discomfort when raising her arm due to cording. Pt was educated today in supine scapular strengthening exercises that she can add to her home exercise program and do throughout radiation.  Radiation begins soon. Next visit will assess for independence with supine scap series and then place pt on hold until completion of radiation then have her come back and reassess for any needs at that time.    PT Frequency 2x / week    PT Duration 4 weeks    PT Treatment/Interventions ADLs/Self Care Home Management;Therapeutic exercise;Patient/family education;Manual  techniques;Manual lymph drainage;Compression bandaging;Orthotic Fit/Training;Scar mobilization;Passive range of motion;Taping;Vasopneumatic Device    PT Next Visit Plan assess indep with supine scap, can instruct in Strength ABC, recert and place pt on hold until completion of radiation and schedule appt for pt to be seen once radiation is done    PT Home Exercise Plan post op, supine scap    Consulted and Agree with Plan of Care Patient           Patient will benefit from skilled therapeutic intervention in order to improve the following deficits and impairments:  Pain,Postural dysfunction,Impaired UE functional use,Increased fascial restricitons,Decreased range of motion,Decreased scar mobility,Increased edema,Decreased knowledge of precautions  Visit Diagnosis: Stiffness of right shoulder, not elsewhere classified  Localized edema     Problem List Patient Active Problem List   Diagnosis Date Noted  . Genetic testing 06/23/2020  . Family history of prostate cancer   . Family history of breast cancer   . Family history of leukemia   . Family history of lung cancer   . Family history of stomach cancer   . Hepatic steatosis 02/13/2020  . Malignant neoplasm of lower-outer quadrant of right breast of female, estrogen receptor positive ([place]) 01/29/2020  . Diabetes mellitus without complication ([place]) 56/31/4970  . Abnormal thyroid stimulating hormone (TSH) level 09/14/2016  . Hyperglycemia 09/14/2016  . Fever blister 09/14/2016  . Advance care planning 09/14/2016  . Cough 10/06/2014  . Headache(784.0) 06/05/2012  .  Shoulder pain 01/18/2012  . Anxiety 02/18/2011  . Routine general medical examination at a health care facility 09/03/2010  . Hypothyroidism 12/14/2006    Allyson Sabal [facility] 06/30/2020, 3:59 PM  [place], [place], [address] Phone: [phone]   Fax:  [phone]  Name: KATERINE MORUA MRN: [id number] Date of Birth: 10-16-1970  Manus Gunning, PT 06/30/20 4:00 PM

## 2020-07-01 DIAGNOSIS — Z510 Encounter for antineoplastic radiation therapy: Secondary | ICD-10-CM | POA: Diagnosis not present

## 2020-07-02 ENCOUNTER — Encounter: Payer: Self-pay | Admitting: Family Medicine

## 2020-07-02 ENCOUNTER — Ambulatory Visit: Payer: No Typology Code available for payment source

## 2020-07-03 ENCOUNTER — Other Ambulatory Visit: Payer: Self-pay | Admitting: Family Medicine

## 2020-07-03 MED ORDER — FLUCONAZOLE 150 MG PO TABS: 150.0000 mg | ORAL_TABLET | Freq: Once | ORAL | 0 refills | Status: AC

## 2020-07-05 DIAGNOSIS — Z9889 Other specified postprocedural states: Secondary | ICD-10-CM | POA: Insufficient documentation

## 2020-07-05 NOTE — Progress Notes (Signed)
Patient is a 50 year old female here for follow-up after undergoing exchange of right tissue expander for silicone implant and left breast mastopexy for symmetry with Dr. Marla Roe on 05/28/2020.  Patient had [place]or smooth round ultra high profile gel 650 cc implant placed in the right breast.  ~ 6 weeks PO Patient reports she is doing very well.  Denies fever/chills, nausea/vomiting.  Reports right breast implant sits a little high and lateral.  Bilateral incisions are healing very nicely, C/D/I.  No signs of infection, redness, drainage.  No signs of seroma/hematoma.  She may do massage pushing inward and downward on the right implant to assist with settling.  At this time she may use any scar cream of her choice if desired.  She no longer needs to wear compression during the night.  May resume normal activities gradually as tolerated.  May shower normally.  She begins radiation today and will continue for 6 weeks.  Recommend follow-up in 8 to 10 weeks.  Return precautions provided.  Call office with any questions/concerns.  Pictures were obtained of the patient and placed in the chart with the patient's or guardian's permission.

## 2020-07-07 ENCOUNTER — Other Ambulatory Visit: Payer: Self-pay

## 2020-07-07 ENCOUNTER — Encounter: Payer: Self-pay | Admitting: Plastic Surgery

## 2020-07-07 ENCOUNTER — Ambulatory Visit (INDEPENDENT_AMBULATORY_CARE_PROVIDER_SITE_OTHER): Payer: No Typology Code available for payment source | Admitting: Plastic Surgery

## 2020-07-07 ENCOUNTER — Ambulatory Visit
Admission: RE | Admit: 2020-07-07 | Discharge: 2020-07-07 | Disposition: A | Discharge status: Home / Self Care | Payer: No Typology Code available for payment source | Source: Ambulatory Visit | Attending: Radiation Oncology | Admitting: Radiation Oncology

## 2020-07-07 VITALS — BP 137/87 | HR 87

## 2020-07-07 DIAGNOSIS — Z510 Encounter for antineoplastic radiation therapy: Secondary | ICD-10-CM | POA: Diagnosis not present

## 2020-07-07 DIAGNOSIS — Z9889 Other specified postprocedural states: Secondary | ICD-10-CM

## 2020-07-08 ENCOUNTER — Ambulatory Visit
Admission: RE | Admit: 2020-07-08 | Discharge: 2020-07-08 | Disposition: A | Discharge status: Home / Self Care | Payer: No Typology Code available for payment source | Source: Ambulatory Visit | Attending: Radiation Oncology | Admitting: Radiation Oncology

## 2020-07-08 ENCOUNTER — Other Ambulatory Visit: Payer: Self-pay

## 2020-07-08 DIAGNOSIS — Z510 Encounter for antineoplastic radiation therapy: Secondary | ICD-10-CM | POA: Diagnosis not present

## 2020-07-09 ENCOUNTER — Ambulatory Visit
Admission: RE | Admit: 2020-07-09 | Discharge: 2020-07-09 | Disposition: A | Discharge status: Home / Self Care | Payer: No Typology Code available for payment source | Source: Ambulatory Visit | Attending: Radiation Oncology | Admitting: Radiation Oncology

## 2020-07-09 DIAGNOSIS — Z510 Encounter for antineoplastic radiation therapy: Secondary | ICD-10-CM | POA: Diagnosis not present

## 2020-07-10 ENCOUNTER — Ambulatory Visit
Admission: RE | Admit: 2020-07-10 | Discharge: 2020-07-10 | Disposition: A | Discharge status: Home / Self Care | Payer: No Typology Code available for payment source | Source: Ambulatory Visit | Attending: Radiation Oncology | Admitting: Radiation Oncology

## 2020-07-10 ENCOUNTER — Other Ambulatory Visit: Payer: Self-pay

## 2020-07-10 DIAGNOSIS — Z510 Encounter for antineoplastic radiation therapy: Secondary | ICD-10-CM | POA: Diagnosis not present

## 2020-07-13 ENCOUNTER — Other Ambulatory Visit: Payer: Self-pay

## 2020-07-13 ENCOUNTER — Ambulatory Visit
Admission: RE | Admit: 2020-07-13 | Discharge: 2020-07-13 | Disposition: A | Discharge status: Home / Self Care | Payer: No Typology Code available for payment source | Source: Ambulatory Visit | Attending: Radiation Oncology | Admitting: Radiation Oncology

## 2020-07-13 DIAGNOSIS — C50511 Malignant neoplasm of lower-outer quadrant of right female breast: Secondary | ICD-10-CM

## 2020-07-13 DIAGNOSIS — K760 Fatty (change of) liver, not elsewhere classified: Secondary | ICD-10-CM

## 2020-07-13 DIAGNOSIS — Z510 Encounter for antineoplastic radiation therapy: Secondary | ICD-10-CM | POA: Diagnosis not present

## 2020-07-13 MED ORDER — ALRA NON-METALLIC DEODORANT (RAD-ONC)
1.0000 "application " | Freq: Once | TOPICAL | Status: AC
  Administered 2020-07-13: 1 via TOPICAL

## 2020-07-13 MED ORDER — RADIAPLEXRX EX GEL: Freq: Once | CUTANEOUS | Status: AC

## 2020-07-13 NOTE — Progress Notes (Signed)

## 2020-07-14 ENCOUNTER — Other Ambulatory Visit: Payer: Self-pay

## 2020-07-14 ENCOUNTER — Ambulatory Visit
Admission: RE | Admit: 2020-07-14 | Discharge: 2020-07-14 | Disposition: A | Discharge status: Home / Self Care | Payer: No Typology Code available for payment source | Source: Ambulatory Visit | Attending: Radiation Oncology | Admitting: Radiation Oncology

## 2020-07-14 DIAGNOSIS — M25611 Stiffness of right shoulder, not elsewhere classified: Secondary | ICD-10-CM | POA: Diagnosis not present

## 2020-07-14 DIAGNOSIS — Z170 Estrogen receptor positive status [ER+]: Secondary | ICD-10-CM | POA: Insufficient documentation

## 2020-07-14 DIAGNOSIS — C50511 Malignant neoplasm of lower-outer quadrant of right female breast: Secondary | ICD-10-CM | POA: Insufficient documentation

## 2020-07-14 DIAGNOSIS — R293 Abnormal posture: Secondary | ICD-10-CM | POA: Diagnosis not present

## 2020-07-14 DIAGNOSIS — R600 Localized edema: Secondary | ICD-10-CM | POA: Diagnosis not present

## 2020-07-15 ENCOUNTER — Ambulatory Visit
Admission: RE | Admit: 2020-07-15 | Discharge: 2020-07-15 | Disposition: A | Discharge status: Home / Self Care | Payer: No Typology Code available for payment source | Source: Ambulatory Visit | Attending: Radiation Oncology | Admitting: Radiation Oncology

## 2020-07-15 ENCOUNTER — Other Ambulatory Visit: Payer: Self-pay

## 2020-07-15 DIAGNOSIS — C50511 Malignant neoplasm of lower-outer quadrant of right female breast: Secondary | ICD-10-CM | POA: Diagnosis not present

## 2020-07-16 ENCOUNTER — Ambulatory Visit: Payer: No Typology Code available for payment source

## 2020-07-17 ENCOUNTER — Ambulatory Visit: Payer: No Typology Code available for payment source

## 2020-07-20 ENCOUNTER — Ambulatory Visit
Admission: RE | Admit: 2020-07-20 | Discharge: 2020-07-20 | Disposition: A | Discharge status: Home / Self Care | Payer: No Typology Code available for payment source | Source: Ambulatory Visit | Attending: Radiation Oncology | Admitting: Radiation Oncology

## 2020-07-20 ENCOUNTER — Other Ambulatory Visit: Payer: Self-pay

## 2020-07-20 DIAGNOSIS — C50511 Malignant neoplasm of lower-outer quadrant of right female breast: Secondary | ICD-10-CM | POA: Diagnosis not present

## 2020-07-21 ENCOUNTER — Ambulatory Visit
Admission: RE | Admit: 2020-07-21 | Discharge: 2020-07-21 | Disposition: A | Discharge status: Home / Self Care | Payer: No Typology Code available for payment source | Source: Ambulatory Visit | Attending: Radiation Oncology | Admitting: Radiation Oncology

## 2020-07-21 ENCOUNTER — Inpatient Hospital Stay: Payer: No Typology Code available for payment source | Attending: Oncology

## 2020-07-21 ENCOUNTER — Other Ambulatory Visit: Payer: Self-pay

## 2020-07-21 VITALS — BP 150/83 | HR 90 | Temp 98.0°F | Resp 18

## 2020-07-21 DIAGNOSIS — R600 Localized edema: Secondary | ICD-10-CM | POA: Insufficient documentation

## 2020-07-21 DIAGNOSIS — C50511 Malignant neoplasm of lower-outer quadrant of right female breast: Secondary | ICD-10-CM | POA: Insufficient documentation

## 2020-07-21 DIAGNOSIS — M25611 Stiffness of right shoulder, not elsewhere classified: Secondary | ICD-10-CM | POA: Insufficient documentation

## 2020-07-21 DIAGNOSIS — Z170 Estrogen receptor positive status [ER+]: Secondary | ICD-10-CM | POA: Insufficient documentation

## 2020-07-21 DIAGNOSIS — R293 Abnormal posture: Secondary | ICD-10-CM | POA: Insufficient documentation

## 2020-07-21 MED ORDER — GOSERELIN ACETATE 3.6 MG ~~LOC~~ IMPL
DRUG_IMPLANT | SUBCUTANEOUS | Status: AC
  Filled 2020-07-21: qty 3.6

## 2020-07-21 MED ORDER — GOSERELIN ACETATE 3.6 MG ~~LOC~~ IMPL
3.6000 mg | DRUG_IMPLANT | Freq: Once | SUBCUTANEOUS | Status: AC
  Administered 2020-07-21: 3.6 mg via SUBCUTANEOUS

## 2020-07-21 NOTE — Patient Instructions (Signed)

## 2020-07-22 ENCOUNTER — Other Ambulatory Visit: Payer: Self-pay

## 2020-07-22 ENCOUNTER — Ambulatory Visit
Admission: RE | Admit: 2020-07-22 | Discharge: 2020-07-22 | Disposition: A | Discharge status: Home / Self Care | Payer: No Typology Code available for payment source | Source: Ambulatory Visit | Attending: Radiation Oncology | Admitting: Radiation Oncology

## 2020-07-22 DIAGNOSIS — C50511 Malignant neoplasm of lower-outer quadrant of right female breast: Secondary | ICD-10-CM | POA: Diagnosis not present

## 2020-07-23 ENCOUNTER — Ambulatory Visit
Admission: RE | Admit: 2020-07-23 | Discharge: 2020-07-23 | Disposition: A | Discharge status: Home / Self Care | Payer: No Typology Code available for payment source | Source: Ambulatory Visit | Attending: Radiation Oncology | Admitting: Radiation Oncology

## 2020-07-23 ENCOUNTER — Other Ambulatory Visit: Payer: Self-pay

## 2020-07-23 DIAGNOSIS — C50511 Malignant neoplasm of lower-outer quadrant of right female breast: Secondary | ICD-10-CM | POA: Diagnosis not present

## 2020-07-24 ENCOUNTER — Other Ambulatory Visit: Payer: Self-pay

## 2020-07-24 ENCOUNTER — Ambulatory Visit
Admission: RE | Admit: 2020-07-24 | Discharge: 2020-07-24 | Disposition: A | Discharge status: Home / Self Care | Payer: No Typology Code available for payment source | Source: Ambulatory Visit | Attending: Radiation Oncology | Admitting: Radiation Oncology

## 2020-07-24 DIAGNOSIS — C50511 Malignant neoplasm of lower-outer quadrant of right female breast: Secondary | ICD-10-CM | POA: Diagnosis not present

## 2020-07-27 ENCOUNTER — Ambulatory Visit
Admission: RE | Admit: 2020-07-27 | Discharge: 2020-07-27 | Disposition: A | Discharge status: Home / Self Care | Payer: No Typology Code available for payment source | Source: Ambulatory Visit | Attending: Radiation Oncology | Admitting: Radiation Oncology

## 2020-07-27 DIAGNOSIS — C50511 Malignant neoplasm of lower-outer quadrant of right female breast: Secondary | ICD-10-CM

## 2020-07-27 DIAGNOSIS — Z170 Estrogen receptor positive status [ER+]: Secondary | ICD-10-CM

## 2020-07-27 MED ORDER — RADIAPLEXRX EX GEL: Freq: Once | CUTANEOUS | Status: AC

## 2020-07-28 ENCOUNTER — Other Ambulatory Visit: Payer: Self-pay

## 2020-07-28 ENCOUNTER — Ambulatory Visit
Admission: RE | Admit: 2020-07-28 | Discharge: 2020-07-28 | Disposition: A | Discharge status: Home / Self Care | Payer: No Typology Code available for payment source | Source: Ambulatory Visit | Attending: Radiation Oncology | Admitting: Radiation Oncology

## 2020-07-28 DIAGNOSIS — C50511 Malignant neoplasm of lower-outer quadrant of right female breast: Secondary | ICD-10-CM | POA: Diagnosis not present

## 2020-07-29 ENCOUNTER — Ambulatory Visit
Admission: RE | Admit: 2020-07-29 | Discharge: 2020-07-29 | Disposition: A | Discharge status: Home / Self Care | Payer: No Typology Code available for payment source | Source: Ambulatory Visit | Attending: Radiation Oncology | Admitting: Radiation Oncology

## 2020-07-29 ENCOUNTER — Other Ambulatory Visit: Payer: Self-pay

## 2020-07-29 DIAGNOSIS — C50511 Malignant neoplasm of lower-outer quadrant of right female breast: Secondary | ICD-10-CM | POA: Diagnosis not present

## 2020-07-30 ENCOUNTER — Other Ambulatory Visit: Payer: Self-pay

## 2020-07-30 ENCOUNTER — Ambulatory Visit
Admission: RE | Admit: 2020-07-30 | Discharge: 2020-07-30 | Disposition: A | Discharge status: Home / Self Care | Payer: No Typology Code available for payment source | Source: Ambulatory Visit | Attending: Radiation Oncology | Admitting: Radiation Oncology

## 2020-07-30 DIAGNOSIS — C50511 Malignant neoplasm of lower-outer quadrant of right female breast: Secondary | ICD-10-CM | POA: Diagnosis not present

## 2020-07-31 ENCOUNTER — Ambulatory Visit
Admission: RE | Admit: 2020-07-31 | Discharge: 2020-07-31 | Disposition: A | Discharge status: Home / Self Care | Payer: No Typology Code available for payment source | Source: Ambulatory Visit | Attending: Radiation Oncology | Admitting: Radiation Oncology

## 2020-07-31 DIAGNOSIS — C50511 Malignant neoplasm of lower-outer quadrant of right female breast: Secondary | ICD-10-CM | POA: Diagnosis not present

## 2020-08-03 ENCOUNTER — Ambulatory Visit
Admission: RE | Admit: 2020-08-03 | Discharge: 2020-08-03 | Disposition: A | Discharge status: Home / Self Care | Payer: No Typology Code available for payment source | Source: Ambulatory Visit | Attending: Radiation Oncology | Admitting: Radiation Oncology

## 2020-08-03 ENCOUNTER — Ambulatory Visit: Payer: No Typology Code available for payment source | Admitting: Radiation Oncology

## 2020-08-03 ENCOUNTER — Other Ambulatory Visit: Payer: Self-pay

## 2020-08-03 DIAGNOSIS — C50511 Malignant neoplasm of lower-outer quadrant of right female breast: Secondary | ICD-10-CM | POA: Diagnosis not present

## 2020-08-04 ENCOUNTER — Ambulatory Visit: Payer: No Typology Code available for payment source

## 2020-08-04 DIAGNOSIS — C50511 Malignant neoplasm of lower-outer quadrant of right female breast: Secondary | ICD-10-CM | POA: Diagnosis not present

## 2020-08-05 ENCOUNTER — Ambulatory Visit
Admission: RE | Admit: 2020-08-05 | Discharge: 2020-08-05 | Disposition: A | Discharge status: Home / Self Care | Payer: No Typology Code available for payment source | Source: Ambulatory Visit | Attending: Radiation Oncology | Admitting: Radiation Oncology

## 2020-08-05 ENCOUNTER — Other Ambulatory Visit: Payer: Self-pay

## 2020-08-05 DIAGNOSIS — C50511 Malignant neoplasm of lower-outer quadrant of right female breast: Secondary | ICD-10-CM | POA: Diagnosis not present

## 2020-08-06 ENCOUNTER — Ambulatory Visit
Admission: RE | Admit: 2020-08-06 | Discharge: 2020-08-06 | Disposition: A | Discharge status: Home / Self Care | Payer: No Typology Code available for payment source | Source: Ambulatory Visit | Attending: Radiation Oncology | Admitting: Radiation Oncology

## 2020-08-06 ENCOUNTER — Other Ambulatory Visit: Payer: Self-pay

## 2020-08-06 DIAGNOSIS — C50511 Malignant neoplasm of lower-outer quadrant of right female breast: Secondary | ICD-10-CM | POA: Diagnosis not present

## 2020-08-07 ENCOUNTER — Ambulatory Visit
Admission: RE | Admit: 2020-08-07 | Discharge: 2020-08-07 | Disposition: A | Discharge status: Home / Self Care | Payer: No Typology Code available for payment source | Source: Ambulatory Visit | Attending: Radiation Oncology | Admitting: Radiation Oncology

## 2020-08-07 DIAGNOSIS — C50511 Malignant neoplasm of lower-outer quadrant of right female breast: Secondary | ICD-10-CM | POA: Diagnosis not present

## 2020-08-10 ENCOUNTER — Ambulatory Visit: Payer: No Typology Code available for payment source | Admitting: Radiation Oncology

## 2020-08-10 ENCOUNTER — Ambulatory Visit
Admission: RE | Admit: 2020-08-10 | Discharge: 2020-08-10 | Disposition: A | Discharge status: Home / Self Care | Payer: No Typology Code available for payment source | Source: Ambulatory Visit | Attending: Radiation Oncology | Admitting: Radiation Oncology

## 2020-08-10 ENCOUNTER — Other Ambulatory Visit: Payer: Self-pay

## 2020-08-10 DIAGNOSIS — C50511 Malignant neoplasm of lower-outer quadrant of right female breast: Secondary | ICD-10-CM

## 2020-08-10 MED ORDER — RADIAPLEXRX EX GEL: Freq: Once | CUTANEOUS | Status: AC

## 2020-08-11 ENCOUNTER — Other Ambulatory Visit: Payer: Self-pay

## 2020-08-11 ENCOUNTER — Ambulatory Visit
Admission: RE | Admit: 2020-08-11 | Discharge: 2020-08-11 | Disposition: A | Discharge status: Home / Self Care | Payer: No Typology Code available for payment source | Source: Ambulatory Visit | Attending: Radiation Oncology | Admitting: Radiation Oncology

## 2020-08-11 DIAGNOSIS — C50511 Malignant neoplasm of lower-outer quadrant of right female breast: Secondary | ICD-10-CM | POA: Diagnosis not present

## 2020-08-12 ENCOUNTER — Ambulatory Visit
Admission: RE | Admit: 2020-08-12 | Discharge: 2020-08-12 | Disposition: A | Discharge status: Home / Self Care | Payer: No Typology Code available for payment source | Source: Ambulatory Visit | Attending: Radiation Oncology | Admitting: Radiation Oncology

## 2020-08-12 DIAGNOSIS — C50511 Malignant neoplasm of lower-outer quadrant of right female breast: Secondary | ICD-10-CM | POA: Diagnosis not present

## 2020-08-13 ENCOUNTER — Other Ambulatory Visit: Payer: Self-pay

## 2020-08-13 ENCOUNTER — Ambulatory Visit
Admission: RE | Admit: 2020-08-13 | Discharge: 2020-08-13 | Disposition: A | Discharge status: Home / Self Care | Payer: No Typology Code available for payment source | Source: Ambulatory Visit | Attending: Radiation Oncology | Admitting: Radiation Oncology

## 2020-08-13 DIAGNOSIS — C50511 Malignant neoplasm of lower-outer quadrant of right female breast: Secondary | ICD-10-CM | POA: Diagnosis not present

## 2020-08-14 ENCOUNTER — Ambulatory Visit: Payer: No Typology Code available for payment source

## 2020-08-14 ENCOUNTER — Ambulatory Visit
Admission: RE | Admit: 2020-08-14 | Discharge: 2020-08-14 | Disposition: A | Discharge status: Home / Self Care | Payer: No Typology Code available for payment source | Source: Ambulatory Visit | Attending: Radiation Oncology | Admitting: Radiation Oncology

## 2020-08-14 DIAGNOSIS — C50511 Malignant neoplasm of lower-outer quadrant of right female breast: Secondary | ICD-10-CM | POA: Insufficient documentation

## 2020-08-14 DIAGNOSIS — R293 Abnormal posture: Secondary | ICD-10-CM | POA: Diagnosis not present

## 2020-08-14 DIAGNOSIS — M25611 Stiffness of right shoulder, not elsewhere classified: Secondary | ICD-10-CM | POA: Diagnosis not present

## 2020-08-14 DIAGNOSIS — Z170 Estrogen receptor positive status [ER+]: Secondary | ICD-10-CM | POA: Insufficient documentation

## 2020-08-14 DIAGNOSIS — R600 Localized edema: Secondary | ICD-10-CM | POA: Diagnosis not present

## 2020-08-17 ENCOUNTER — Ambulatory Visit
Admission: RE | Admit: 2020-08-17 | Discharge: 2020-08-17 | Disposition: A | Discharge status: Home / Self Care | Payer: No Typology Code available for payment source | Source: Ambulatory Visit | Attending: Radiation Oncology | Admitting: Radiation Oncology

## 2020-08-17 ENCOUNTER — Ambulatory Visit: Payer: No Typology Code available for payment source

## 2020-08-17 ENCOUNTER — Other Ambulatory Visit: Payer: Self-pay

## 2020-08-17 DIAGNOSIS — C50511 Malignant neoplasm of lower-outer quadrant of right female breast: Secondary | ICD-10-CM | POA: Diagnosis not present

## 2020-08-17 DIAGNOSIS — Z170 Estrogen receptor positive status [ER+]: Secondary | ICD-10-CM

## 2020-08-17 MED ORDER — RADIAPLEXRX EX GEL: Freq: Once | CUTANEOUS | Status: AC

## 2020-08-18 ENCOUNTER — Ambulatory Visit: Payer: No Typology Code available for payment source

## 2020-08-18 ENCOUNTER — Inpatient Hospital Stay: Payer: No Typology Code available for payment source | Attending: Oncology

## 2020-08-18 ENCOUNTER — Other Ambulatory Visit: Payer: Self-pay | Admitting: Oncology

## 2020-08-18 ENCOUNTER — Ambulatory Visit
Admission: RE | Admit: 2020-08-18 | Discharge: 2020-08-18 | Disposition: A | Discharge status: Home / Self Care | Payer: No Typology Code available for payment source | Source: Ambulatory Visit | Attending: Radiation Oncology | Admitting: Radiation Oncology

## 2020-08-18 VITALS — BP 138/76 | HR 98 | Temp 98.3°F | Resp 18

## 2020-08-18 DIAGNOSIS — R293 Abnormal posture: Secondary | ICD-10-CM | POA: Insufficient documentation

## 2020-08-18 DIAGNOSIS — R600 Localized edema: Secondary | ICD-10-CM | POA: Insufficient documentation

## 2020-08-18 DIAGNOSIS — M25611 Stiffness of right shoulder, not elsewhere classified: Secondary | ICD-10-CM | POA: Insufficient documentation

## 2020-08-18 DIAGNOSIS — C50511 Malignant neoplasm of lower-outer quadrant of right female breast: Secondary | ICD-10-CM | POA: Insufficient documentation

## 2020-08-18 DIAGNOSIS — Z170 Estrogen receptor positive status [ER+]: Secondary | ICD-10-CM | POA: Insufficient documentation

## 2020-08-18 MED ORDER — GOSERELIN ACETATE 3.6 MG ~~LOC~~ IMPL
3.6000 mg | DRUG_IMPLANT | Freq: Once | SUBCUTANEOUS | Status: AC
  Administered 2020-08-18: 3.6 mg via SUBCUTANEOUS

## 2020-08-18 MED ORDER — GOSERELIN ACETATE 3.6 MG ~~LOC~~ IMPL
DRUG_IMPLANT | SUBCUTANEOUS | Status: AC
  Filled 2020-08-18: qty 3.6

## 2020-08-18 NOTE — Progress Notes (Signed)
PCP: [name], MD   Chief Complaint  Patient presents with  . Gynecologic Exam    IUD removal    HPI:      [name] is a 50 y.o. [id number] whose LMP was No LMP recorded. (Menstrual status: IUD)., presents today for her annual examination.  Her menses are absent with IUD. Also on tamoxifen for breast cancer.  She does not have intermenstrual bleeding. She does have vasomotor sx.   Sex activity: single partner, contraception - IUD, placed 2014. Due for removal due to expiration and pt with ER+/PR+ breast cancer. She does not have vaginal dryness. Has been having bleeding with sex recently. Is bright red and starts during sex and lasts till next day, no pain/cramping. No new partners.   Has also noticed vaginal itching without increased d/c/odor for a few days after zoladex tx the past few months. Sx resolve spontaneously, no tx, no sx today.  Last Pap: 01/2016  Results were: ASCUS with NEGATIVE high risk HPV . Due for repeat pap.  Last mammogram: 01/15/20 after breast mass noted on SBE and my CBE.  Results were: Cat 5 RT breast. Diagnosed with RT breast DCIS with 2 positive lymph nodes. Had RT breast mastectomy and reconstruction, and doing tamoxifen and goserlin tx. Finishing radiation this wk. Breast cancer was ER+/PR+. Being followed for benign LT breast mass 10/21 on breast MRI. Sees Dr. Jana Hakim. Had neg cancer genetic testing.  There is no FH of breast cancer. There is no FH of ovarian cancer. The patient does do self-breast exams. Hx of metastatic prostate cancer in her dad and mat uncle, prostate cancer in her MGF.  Colonoscopy: never; doing cologuard with PCP  Tobacco use: The patient denies current or previous tobacco use. Alcohol use: none  No drug use Exercise: moderately active  She does get adequate calcium and Vitamin D in her diet.  Labs with PCP.   Past Medical History:  Diagnosis Date  . Anxiety    h/o, was prev on zoloft  . Breast cancer ([place])  01/2020   seeing Dr. Marlou Starks  . Cholelithiasis   . Diabetes mellitus without complication ([place])   . Family history of breast cancer   . Family history of leukemia   . Family history of lung cancer   . Family history of prostate cancer   . Family history of stomach cancer   . Hypothyroidism    postpartum hypothyroidism  . IUD (intrauterine device) in place    mirena per [employer]  . Major depression     Past Surgical History:  Procedure Laterality Date  . BREAST RECONSTRUCTION WITH PLACEMENT OF TISSUE EXPANDER AND FLEX HD (ACELLULAR HYDRATED DERMIS) Right 03/30/2020   Procedure: BREAST RECONSTRUCTION WITH PLACEMENT OF TISSUE EXPANDER AND FLEX HD (ACELLULAR HYDRATED DERMIS);  Surgeon: Wallace Going, DO;  Location: [facility];  Service: Plastics;  Laterality: Right;  . BREAST REDUCTION WITH MASTOPEXY Left 05/28/2020   Procedure: BREAST REDUCTION WITH MASTOPEXY;  Surgeon: Wallace Going, DO;  Location: [facility];  Service: Plastics;  Laterality: Left;  . CESAREAN SECTION     X 2 Fetal stress, second scheduled  . CHOLECYSTECTOMY  05/1998  . MASTECTOMY MODIFIED RADICAL Right 03/30/2020   Procedure: RIGHT MASTECTOMY MODIFIED RADICAL;  Surgeon: Jovita Kussmaul, MD;  Location: [facility];  Service: General;  Laterality: Right;  [place], RNFA  . REMOVAL OF TISSUE EXPANDER AND PLACEMENT OF IMPLANT Right  05/28/2020   Procedure: REMOVAL OF TISSUE EXPANDER AND PLACEMENT OF IMPLANT;  Surgeon: Wallace Going, DO;  Location: [facility];  Service: Plastics;  Laterality: Right;  2.5 hours, please    Family History  Problem Relation Age of Onset  . Hypertension Father   . Depression Father   . Prostate cancer Father 68       Prostate, S/P prostatectomy, metastatic  . Asthma Sister   . Spina bifida Sister   . Hypothyroidism Mother   . Hypertension Maternal Grandmother   . Hyperlipidemia Maternal Grandmother   . Lung  disease Maternal Grandmother   . Lung cancer Maternal Grandmother 74  . Hypothyroidism Maternal Grandfather   . Prostate cancer Maternal Grandfather   . Cancer Maternal Grandfather        lung, stomach, and prostate cancers  . Emphysema Paternal Grandmother   . Diabetes Maternal Uncle   . Diabetes Maternal Uncle   . Leukemia Maternal Uncle 23  . Prostate cancer Maternal Uncle        dx in his 47s/70s, metastatic  . Breast cancer Other 45       negative genetic testing; maternal first cousin  . Stroke Neg Hx   . Colon cancer Neg Hx     Social History   Socioeconomic History  . Marital status: Divorced    Spouse name: Not on file  . Number of children: Not on file  . Years of education: Not on file  . Highest education level: Not on file  Occupational History  . Occupation: Housewife  Tobacco Use  . Smoking status: Never Smoker  . Smokeless tobacco: Never Used  Vaping Use  . Vaping Use: Never used  Substance and Sexual Activity  . Alcohol use: Yes    Alcohol/week: 0.0 standard drinks    Comment: Very rarely.  . Drug use: No  . Sexual activity: Yes    Birth control/protection: I.U.D.    Comment: Mirena  Other Topics Concern  . Not on file  Social History Narrative   Married 1997, divorced 2017   In relationship as of 2021   2 kids   Social Determinants of Radio broadcast assistant Strain: Not on file  Food Insecurity: Not on file  Transportation Needs: Not on file  Physical Activity: Not on file  Stress: Not on file  Social Connections: Not on file  Intimate Partner Violence: Not on file     Current Outpatient Medications:  .  acyclovir ointment (ZOVIRAX) 5 %, Apply 1 application topically every 3 (three) hours. As needed., Disp: 5 g, Rfl: 5 .  cholecalciferol (VITAMIN D3) 25 MCG (1000 UNIT) tablet, Take 1,000 Units by mouth daily., Disp: , Rfl:  .  cyclobenzaprine (FLEXERIL) 10 MG tablet, TAKE 1/2 TO 1 TABLET BY MOUTH EVERY DAY AS NEEDED, Disp: 30  tablet, Rfl: 5 .  escitalopram (LEXAPRO) 10 MG tablet, Take 1 tablet (10 mg total) by mouth in the morning and at bedtime., Disp: 180 tablet, Rfl: 3 .  fluconazole (DIFLUCAN) 150 MG tablet, Take 1 tablet (150 mg total) by mouth once for 1 dose., Disp: 1 tablet, Rfl: 0 .  goserelin (ZOLADEX) 3.6 MG injection, Inject 3.6 mg into the skin every 28 (twenty-eight) days., Disp: , Rfl:  .  ibuprofen (ADVIL,MOTRIN) 200 MG tablet, Take 200 mg by mouth every 6 (six) hours as needed., Disp: , Rfl:  .  levonorgestrel (MIRENA) 20 MCG/24HR IUD, 1 each by Intrauterine route once. 11/05/12, Disp: ,  Rfl:  .  levothyroxine (SYNTHROID) 25 MCG tablet, Take 1 tablet (25 mcg total) by mouth daily before breakfast., Disp: 90 tablet, Rfl: 3 .  tamoxifen (NOLVADEX) 20 MG tablet, Take 1 tablet (20 mg total) by mouth daily., Disp: 90 tablet, Rfl: 4 .  Turmeric (QC TUMERIC COMPLEX PO), Take by mouth., Disp: , Rfl:  .  valACYclovir (VALTREX) 1000 MG tablet, Take 2 tablets (2,000 mg total) by mouth 2 (two) times daily as needed. For 1 day., Disp: 20 tablet, Rfl: 5     ROS:  Review of Systems  Constitutional: Negative for fatigue, fever and unexpected weight change.  Respiratory: Negative for cough, shortness of breath and wheezing.   Cardiovascular: Negative for chest pain, palpitations and leg swelling.  Gastrointestinal: Negative for blood in stool, constipation, diarrhea, nausea and vomiting.  Endocrine: Negative for cold intolerance, heat intolerance and polyuria.  Genitourinary: Negative for dyspareunia, dysuria, flank pain, frequency, genital sores, hematuria, menstrual problem, pelvic pain, urgency, vaginal bleeding, vaginal discharge and vaginal pain.  Musculoskeletal: Negative for back pain, joint swelling and myalgias.  Skin: Negative for rash.  Neurological: Negative for dizziness, syncope, light-headedness, numbness and headaches.  Hematological: Negative for adenopathy.  Psychiatric/Behavioral: Positive for  agitation and dysphoric mood. Negative for confusion, sleep disturbance and suicidal ideas. The patient is not nervous/anxious.   BREAST: No symptoms    Objective: BP 120/80   Ht 5\' 3"  (1.6 m)   Wt 178 lb (80.7 kg)   BMI 31.53 kg/m    Physical Exam Constitutional:      Appearance: She is well-developed.  Genitourinary:     Vulva normal.     Right Labia: No rash, tenderness or lesions.    Left Labia: No tenderness, lesions or rash.    Vaginal discharge present.     No vaginal erythema or tenderness.      Right Adnexa: not tender and no mass present.    Left Adnexa: not tender and no mass present.    No cervical friability or polyp.     Uterus is not enlarged or tender.  Breasts:     Right: No mass, nipple discharge, skin change or tenderness.     Left: No mass, nipple discharge, skin change or tenderness.    Neck:     Thyroid: No thyromegaly.  Cardiovascular:     Rate and Rhythm: Normal rate and regular rhythm.     Heart sounds: Normal heart sounds. No murmur heard.   Pulmonary:     Effort: Pulmonary effort is normal.     Breath sounds: Normal breath sounds.  Abdominal:     Palpations: Abdomen is soft.     Tenderness: There is no abdominal tenderness. There is no guarding or rebound.  Musculoskeletal:        General: Normal range of motion.     Cervical back: Normal range of motion.  Lymphadenopathy:     Cervical: No cervical adenopathy.  Neurological:     General: No focal deficit present.     Mental Status: She is alert and oriented to person, place, and time.     Cranial Nerves: No cranial nerve deficit.  Skin:    General: Skin is warm and dry.  Psychiatric:        Mood and Affect: Mood normal.        Behavior: Behavior normal.        Thought Content: Thought content normal.        Judgment: Judgment normal.  Vitals  reviewed.     Results: Results for orders placed or performed in visit on 08/19/20 (from the past 24 hour(s))  POCT Wet Prep with KOH      Status: Abnormal   Collection Time: 08/19/20  4:33 PM  Result Value Ref Range   Trichomonas, UA Negative    Clue Cells Wet Prep HPF POC neg    Epithelial Wet Prep HPF POC     Yeast Wet Prep HPF POC pos    Bacteria Wet Prep HPF POC     RBC Wet Prep HPF POC     WBC Wet Prep HPF POC     KOH Prep POC Negative Negative    IUD Removal Strings of IUD identified and grasped.  IUD removed without problem with ring forceps.  Pt tolerated this well.  IUD noted to be intact.   Assessment/Plan:  Encounter for annual routine gynecological examination  Cervical cancer screening - Plan: Cytology - PAP,   Screening for STD (sexually transmitted disease) - Plan: Cytology - PAP  Postcoital bleeding - Plan: Cytology - PAP; rule out pap/STDs. Cx not friable. IUD removed and see if sx improve if labs neg. If not, f/u prn.   Screening for HPV (human papillomavirus) - Plan: Cytology - PAP  Encounter for removal of intrauterine contraceptive device (IUD)--pt tolerated well  Malignant neoplasm of lower-outer quadrant of right breast of female, estrogen receptor positive ([place]); followed by onc.   Screening for colon cancer--doing cologuard through PCP  Candidal vaginitis - Plan: fluconazole (DIFLUCAN) 150 MG tablet, POCT Wet Prep with KOH; pos exam and wet prep. Rx diflucan. F/u prn.   Hx of tamoxifen--f/u prn AUB. Unsure if pt postmenopausal or if amenorrhea from IUD. Will follow bleeding prn.    Meds ordered this encounter  Medications  . fluconazole (DIFLUCAN) 150 MG tablet    Sig: Take 1 tablet (150 mg total) by mouth once for 1 dose.    Dispense:  1 tablet    Refill:  0    Order Specific Question:   Supervising Provider    Answer:   Gae Dry [[id number]]           GYN counsel breast self exam, mammography screening, menopause, adequate intake of calcium and vitamin D, diet and exercise    F/U  Return in about 1 year (around 08/19/2021).  Argusta Mcgann B. Billiejo Sorto, PA-C 08/19/2020 4:38  PM

## 2020-08-18 NOTE — Patient Instructions (Signed)

## 2020-08-19 ENCOUNTER — Ambulatory Visit
Admission: RE | Admit: 2020-08-19 | Discharge: 2020-08-19 | Disposition: A | Discharge status: Home / Self Care | Payer: No Typology Code available for payment source | Source: Ambulatory Visit | Attending: Radiation Oncology | Admitting: Radiation Oncology

## 2020-08-19 ENCOUNTER — Other Ambulatory Visit: Payer: Self-pay

## 2020-08-19 ENCOUNTER — Other Ambulatory Visit (HOSPITAL_COMMUNITY)
Admission: RE | Admit: 2020-08-19 | Discharge: 2020-08-19 | Disposition: A | Discharge status: Home / Self Care | Payer: No Typology Code available for payment source | Source: Ambulatory Visit | Attending: Obstetrics and Gynecology | Admitting: Obstetrics and Gynecology

## 2020-08-19 ENCOUNTER — Ambulatory Visit (INDEPENDENT_AMBULATORY_CARE_PROVIDER_SITE_OTHER): Payer: No Typology Code available for payment source | Admitting: Obstetrics and Gynecology

## 2020-08-19 ENCOUNTER — Ambulatory Visit: Payer: No Typology Code available for payment source

## 2020-08-19 ENCOUNTER — Encounter: Payer: Self-pay | Admitting: Obstetrics and Gynecology

## 2020-08-19 VITALS — BP 120/80 | Ht 63.0 in | Wt 178.0 lb

## 2020-08-19 DIAGNOSIS — Z9229 Personal history of other drug therapy: Secondary | ICD-10-CM

## 2020-08-19 DIAGNOSIS — C50511 Malignant neoplasm of lower-outer quadrant of right female breast: Secondary | ICD-10-CM

## 2020-08-19 DIAGNOSIS — Z1151 Encounter for screening for human papillomavirus (HPV): Secondary | ICD-10-CM | POA: Insufficient documentation

## 2020-08-19 DIAGNOSIS — Z124 Encounter for screening for malignant neoplasm of cervix: Secondary | ICD-10-CM

## 2020-08-19 DIAGNOSIS — Z01419 Encounter for gynecological examination (general) (routine) without abnormal findings: Secondary | ICD-10-CM | POA: Diagnosis not present

## 2020-08-19 DIAGNOSIS — N930 Postcoital and contact bleeding: Secondary | ICD-10-CM

## 2020-08-19 DIAGNOSIS — Z170 Estrogen receptor positive status [ER+]: Secondary | ICD-10-CM

## 2020-08-19 DIAGNOSIS — B3731 Acute candidiasis of vulva and vagina: Secondary | ICD-10-CM

## 2020-08-19 DIAGNOSIS — Z113 Encounter for screening for infections with a predominantly sexual mode of transmission: Secondary | ICD-10-CM | POA: Diagnosis not present

## 2020-08-19 DIAGNOSIS — B373 Candidiasis of vulva and vagina: Secondary | ICD-10-CM | POA: Diagnosis not present

## 2020-08-19 DIAGNOSIS — Z30432 Encounter for removal of intrauterine contraceptive device: Secondary | ICD-10-CM | POA: Diagnosis not present

## 2020-08-19 DIAGNOSIS — Z1211 Encounter for screening for malignant neoplasm of colon: Secondary | ICD-10-CM

## 2020-08-19 LAB — POCT WET PREP WITH KOH
Clue Cells Wet Prep HPF POC: NEGATIVE
KOH Prep POC: NEGATIVE
Trichomonas, UA: NEGATIVE
Yeast Wet Prep HPF POC: POSITIVE

## 2020-08-19 MED ORDER — FLUCONAZOLE 150 MG PO TABS: 150.0000 mg | ORAL_TABLET | Freq: Once | ORAL | 0 refills | Status: AC

## 2020-08-19 NOTE — Patient Instructions (Signed)
I value your feedback and you entrusting us with your care. If you get a [facility] patient survey, I would appreciate you taking the time to let us know about your experience today. Thank you! ? ? ?

## 2020-08-20 ENCOUNTER — Ambulatory Visit: Payer: No Typology Code available for payment source

## 2020-08-20 ENCOUNTER — Ambulatory Visit
Admission: RE | Admit: 2020-08-20 | Discharge: 2020-08-20 | Disposition: A | Discharge status: Home / Self Care | Payer: No Typology Code available for payment source | Source: Ambulatory Visit | Attending: Radiation Oncology | Admitting: Radiation Oncology

## 2020-08-20 DIAGNOSIS — C50511 Malignant neoplasm of lower-outer quadrant of right female breast: Secondary | ICD-10-CM | POA: Diagnosis not present

## 2020-08-20 LAB — CYTOLOGY - PAP
Adequacy: ABSENT
Chlamydia: NEGATIVE
Comment: NEGATIVE
Comment: NEGATIVE
Comment: NORMAL
Diagnosis: NEGATIVE
High risk HPV: NEGATIVE
Neisseria Gonorrhea: NEGATIVE

## 2020-08-21 ENCOUNTER — Ambulatory Visit
Admission: RE | Admit: 2020-08-21 | Discharge: 2020-08-21 | Disposition: A | Discharge status: Home / Self Care | Payer: No Typology Code available for payment source | Source: Ambulatory Visit | Attending: Radiation Oncology | Admitting: Radiation Oncology

## 2020-08-21 DIAGNOSIS — C50511 Malignant neoplasm of lower-outer quadrant of right female breast: Secondary | ICD-10-CM | POA: Diagnosis not present

## 2020-08-24 ENCOUNTER — Encounter: Payer: Self-pay | Admitting: *Deleted

## 2020-08-24 ENCOUNTER — Ambulatory Visit
Admission: RE | Admit: 2020-08-24 | Discharge: 2020-08-24 | Disposition: A | Discharge status: Home / Self Care | Payer: No Typology Code available for payment source | Source: Ambulatory Visit | Attending: Radiation Oncology | Admitting: Radiation Oncology

## 2020-08-24 ENCOUNTER — Ambulatory Visit: Payer: No Typology Code available for payment source

## 2020-08-24 ENCOUNTER — Other Ambulatory Visit: Payer: Self-pay

## 2020-08-24 DIAGNOSIS — C50511 Malignant neoplasm of lower-outer quadrant of right female breast: Secondary | ICD-10-CM | POA: Diagnosis not present

## 2020-08-25 ENCOUNTER — Other Ambulatory Visit: Payer: Self-pay

## 2020-08-25 ENCOUNTER — Ambulatory Visit
Admission: RE | Admit: 2020-08-25 | Discharge: 2020-08-25 | Disposition: A | Discharge status: Home / Self Care | Payer: No Typology Code available for payment source | Source: Ambulatory Visit | Attending: Radiation Oncology | Admitting: Radiation Oncology

## 2020-08-25 ENCOUNTER — Encounter: Payer: Self-pay | Admitting: Radiation Oncology

## 2020-08-25 DIAGNOSIS — C50511 Malignant neoplasm of lower-outer quadrant of right female breast: Secondary | ICD-10-CM | POA: Diagnosis not present

## 2020-09-01 ENCOUNTER — Ambulatory Visit (INDEPENDENT_AMBULATORY_CARE_PROVIDER_SITE_OTHER): Payer: No Typology Code available for payment source | Admitting: Plastic Surgery

## 2020-09-01 ENCOUNTER — Other Ambulatory Visit: Payer: Self-pay

## 2020-09-01 ENCOUNTER — Encounter: Payer: Self-pay | Admitting: Plastic Surgery

## 2020-09-01 VITALS — BP 137/91 | HR 79

## 2020-09-01 DIAGNOSIS — Z9011 Acquired absence of right breast and nipple: Secondary | ICD-10-CM | POA: Diagnosis not present

## 2020-09-01 DIAGNOSIS — Z9889 Other specified postprocedural states: Secondary | ICD-10-CM | POA: Diagnosis not present

## 2020-09-01 NOTE — Progress Notes (Addendum)
   Subjective:    Patient ID: Christy Ferrell, female    DOB: 1970/06/01, 50 y.o.   MRN: [id number]  The patient is a 50 year old female here for follow-up on her right breast reconstruction.  She had a mastectomy in November and then the follow-up second stage reconstruction in January.  She has been going through radiation and finished it 2 weeks ago.  The skin is quite red and hyperpigmented in the radiation area.  She has a 650 cc gel implant and on the right.  She had a reduction on the left side.  Overall she is doing really well.  She is pleased with knowing she has finished the radiation treatment.  She had a MRI planned for 10/04/20.    Review of Systems  Constitutional: Negative.   Eyes: Negative.   Respiratory: Negative.   Cardiovascular: Negative.   Gastrointestinal: Negative.   Genitourinary: Negative.   Musculoskeletal: Negative.   Skin: Positive for color change and wound.       Objective:   Physical Exam Vitals and nursing note reviewed.  Constitutional:      Appearance: Normal appearance.  HENT:     Head: Normocephalic and atraumatic.  Cardiovascular:     Rate and Rhythm: Normal rate.     Pulses: Normal pulses.  Pulmonary:     Effort: Pulmonary effort is normal.  Skin:    Findings: Erythema present.  Neurological:     General: No focal deficit present.     Mental Status: She is alert.  Psychiatric:        Mood and Affect: Mood normal.        Behavior: Behavior normal.        Thought Content: Thought content normal.        Assessment & Plan:     ICD-10-CM   1. H/O mastopexy  Z98.890   2. S/P breast reconstruction, right  Z98.890   3. Acquired absence of right breast  Z90.11     Continue with the sports bra.  I like to see her back in 1 to 2 months.  It will be a while before we can do anything else due to the radiation.  Overall she is doing really well and I am very happy for her.  She knows to call if she has any questions or concerns.

## 2020-09-04 ENCOUNTER — Other Ambulatory Visit: Payer: Self-pay | Admitting: *Deleted

## 2020-09-04 DIAGNOSIS — C50511 Malignant neoplasm of lower-outer quadrant of right female breast: Secondary | ICD-10-CM

## 2020-09-04 DIAGNOSIS — Z170 Estrogen receptor positive status [ER+]: Secondary | ICD-10-CM

## 2020-09-04 NOTE — Progress Notes (Signed)
Received call from pt regarding the need for left breast MRI.  MRI last preformed in October 2021 showing left breast mass with recommendations to preform follow up MRI in 6 months if left mastectomy is not preformed.  RN reviewed with MD and verbal orders received for pt to undergo left breast MRI for evaluation.  Orders placed, pt notified to call the breast center to schedule apt.  Pt verbalized understanding.

## 2020-09-11 ENCOUNTER — Ambulatory Visit (INDEPENDENT_AMBULATORY_CARE_PROVIDER_SITE_OTHER): Payer: No Typology Code available for payment source | Admitting: Family Medicine

## 2020-09-11 ENCOUNTER — Encounter: Payer: Self-pay | Admitting: Family Medicine

## 2020-09-11 ENCOUNTER — Other Ambulatory Visit: Payer: Self-pay

## 2020-09-11 VITALS — BP 122/80 | HR 75 | Temp 97.8°F | Ht 63.0 in | Wt 178.0 lb

## 2020-09-11 DIAGNOSIS — E119 Type 2 diabetes mellitus without complications: Secondary | ICD-10-CM | POA: Diagnosis not present

## 2020-09-11 LAB — POCT GLYCOSYLATED HEMOGLOBIN (HGB A1C): Hemoglobin A1C: 6.8 % — AB (ref 4.0–5.6)

## 2020-09-11 MED ORDER — ESCITALOPRAM OXALATE 10 MG PO TABS: 10.0000 mg | ORAL_TABLET | Freq: Every day | ORAL | Status: DC

## 2020-09-11 NOTE — Progress Notes (Signed)
This visit occurred during the SARS-CoV-2 public health emergency.  Safety protocols were in place, including screening questions prior to the visit, additional usage of staff PPE, and extensive cleaning of exam room while observing appropriate contact time as indicated for disinfecting solutions.  Diabetes:  No meds.   Hypoglycemic episodes: not unless prolonged fasting.   Hyperglycemic episodes: no Feet problems: no Blood Sugars averaging: not checked  eye exam within last year: due, d/w pt.   A1c slightly better at 6.8.   Diet and exercise d/w pt.  She is working on both. She cut out soda.    Mood was better and she was able to cut back to daily 10mg  of lexapro.    Meds, vitals, and allergies reviewed.  ROS: Per HPI unless specifically indicated in ROS section   GEN: nad, alert and oriented HEENT: ncat NECK: supple w/o LA CV: rrr. PULM: ctab, no inc wob ABD: soft, +bs EXT: no edema SKIN: well perfused.

## 2020-09-11 NOTE — Patient Instructions (Signed)
Recheck with labs prior to a physical in August or thereabouts.  Take care.  Glad to see you. Thanks for your effort.

## 2020-09-13 NOTE — Assessment & Plan Note (Signed)
Continue work on diet and exercise.  See after visit summary.

## 2020-09-15 ENCOUNTER — Inpatient Hospital Stay: Payer: No Typology Code available for payment source

## 2020-09-15 ENCOUNTER — Inpatient Hospital Stay: Payer: No Typology Code available for payment source | Attending: Oncology

## 2020-09-15 ENCOUNTER — Encounter: Payer: Self-pay | Admitting: Oncology

## 2020-09-15 ENCOUNTER — Inpatient Hospital Stay (HOSPITAL_BASED_OUTPATIENT_CLINIC_OR_DEPARTMENT_OTHER): Payer: No Typology Code available for payment source | Admitting: Oncology

## 2020-09-15 ENCOUNTER — Other Ambulatory Visit: Payer: Self-pay

## 2020-09-15 VITALS — BP 124/77 | HR 74 | Temp 97.9°F | Resp 18 | Ht 63.0 in | Wt 178.5 lb

## 2020-09-15 DIAGNOSIS — Z170 Estrogen receptor positive status [ER+]: Secondary | ICD-10-CM

## 2020-09-15 DIAGNOSIS — Z9011 Acquired absence of right breast and nipple: Secondary | ICD-10-CM | POA: Insufficient documentation

## 2020-09-15 DIAGNOSIS — Z923 Personal history of irradiation: Secondary | ICD-10-CM | POA: Diagnosis not present

## 2020-09-15 DIAGNOSIS — Z79899 Other long term (current) drug therapy: Secondary | ICD-10-CM | POA: Insufficient documentation

## 2020-09-15 DIAGNOSIS — R232 Flushing: Secondary | ICD-10-CM | POA: Insufficient documentation

## 2020-09-15 DIAGNOSIS — C50511 Malignant neoplasm of lower-outer quadrant of right female breast: Secondary | ICD-10-CM

## 2020-09-15 DIAGNOSIS — Z79810 Long term (current) use of selective estrogen receptor modulators (SERMs): Secondary | ICD-10-CM | POA: Diagnosis not present

## 2020-09-15 LAB — CMP (CANCER CENTER ONLY)
ALT: 32 U/L (ref 0–44)
AST: 37 U/L (ref 15–41)
Albumin: 4 g/dL (ref 3.5–5.0)
Alkaline Phosphatase: 52 U/L (ref 38–126)
Anion gap: 11 (ref 5–15)
BUN: 18 mg/dL (ref 6–20)
CO2: 22 mmol/L (ref 22–32)
Calcium: 9.7 mg/dL (ref 8.9–10.3)
Chloride: 107 mmol/L (ref 98–111)
Creatinine: 0.6 mg/dL (ref 0.44–1.00)
GFR, Estimated: >60 mL/min (ref >60)
Glucose, Bld: 127 mg/dL — ABNORMAL HIGH (ref 70–99)
Potassium: 3.9 mmol/L (ref 3.5–5.1)
Sodium: 140 mmol/L (ref 135–145)
Total Bilirubin: 0.4 mg/dL (ref 0.3–1.2)
Total Protein: 7.4 g/dL (ref 6.5–8.1)

## 2020-09-15 LAB — CBC WITH DIFFERENTIAL/PLATELET
Abs Immature Granulocytes: 0.02 10*3/uL (ref 0.00–0.07)
Basophils Absolute: 0 10*3/uL (ref 0.0–0.1)
Basophils Relative: 1 %
Eosinophils Absolute: 0.1 10*3/uL (ref 0.0–0.5)
Eosinophils Relative: 1 %
HCT: 39.7 % (ref 36.0–46.0)
Hemoglobin: 13.7 g/dL (ref 12.0–15.0)
Immature Granulocytes: 0 %
Lymphocytes Relative: 28 %
Lymphs Abs: 2.2 10*3/uL (ref 0.7–4.0)
MCH: 30.2 pg (ref 26.0–34.0)
MCHC: 34.5 g/dL (ref 30.0–36.0)
MCV: 87.6 fL (ref 80.0–100.0)
Monocytes Absolute: 0.5 10*3/uL (ref 0.1–1.0)
Monocytes Relative: 7 %
Neutro Abs: 4.8 10*3/uL (ref 1.7–7.7)
Neutrophils Relative %: 63 %
Platelets: 247 10*3/uL (ref 150–400)
RBC: 4.53 MIL/uL (ref 3.87–5.11)
RDW: 12.9 % (ref 11.5–15.5)
WBC: 7.7 10*3/uL (ref 4.0–10.5)
nRBC: 0 % (ref 0.0–0.2)

## 2020-09-15 MED ORDER — GOSERELIN ACETATE 3.6 MG ~~LOC~~ IMPL
DRUG_IMPLANT | SUBCUTANEOUS | Status: AC
  Filled 2020-09-15: qty 3.6

## 2020-09-15 MED ORDER — GOSERELIN ACETATE 3.6 MG ~~LOC~~ IMPL
3.6000 mg | DRUG_IMPLANT | Freq: Once | SUBCUTANEOUS | Status: AC
  Administered 2020-09-15: 3.6 mg via SUBCUTANEOUS

## 2020-09-15 MED ORDER — GABAPENTIN 300 MG PO CAPS: 300.0000 mg | ORAL_CAPSULE | Freq: Every day | ORAL | 4 refills | Status: DC

## 2020-09-15 NOTE — Progress Notes (Signed)
[facility]  [phone] [phone]     ID: Christy Ferrell DOB: 09/27/1970  MR#: [id number]  PHX#:[id number]  Patient Care Team: [name], MD as PCP - General (Family Medicine) Mauro Kaufmann, RN as Oncology Nurse Navigator Rockwell Germany, RN as Oncology Nurse Navigator Osher Oettinger, Virgie Dad, MD as Consulting Physician (Oncology) Jovita Kussmaul, MD as Consulting Physician (General Surgery) Dillingham, Loel Lofty, DO as Attending Physician (Plastic Surgery) Chauncey Cruel, MD OTHER MD:  CHIEF COMPLAINT: Estrogen receptor positive breast cancer (s/p right mastectomy)  CURRENT TREATMENT: tamoxifen, goserelin   INTERVAL HISTORY: Christy "Christy Ferrell" returns today for follow up of her estrogen receptor positive breast cancer.   She started tamoxifen on 02/13/2020.  She does have significant hot flashes from this although she says they do not wake her up.  She has neither vaginal wetness or vaginal dryness at this time.  She has no other side effects from this medication that she is aware of..  Since her last visit, she received radiation therapy from 07/07/2020 through 08/25/2020 under Dr. Isidore Moos.  She receives goserelin monthly, with a dose due today.  She has had no significant side effects from this and particularly no vaginal dryness.  She says sometimes she gets a little cramp in her abdomen, very superficially and it tends to occur a little bit more frequently after one of the goserelin shots.  She is scheduled for breast MRI on 09/20/2020.   REVIEW OF SYSTEMS: Christy Ferrell did very well with the radiation.  She did have significant blistering as she put it and she says in the last 10 treatments or so they concentrated on the booster.  She did have some fatigue but her energy is better.  She had some physical therapy and she is trying to schedule up some additional treatments.  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  A detailed review of systems today was otherwise noncontributory   COVID 19 VACCINATION STATUS: Not vaccinated as of February 2022   HISTORY OF CURRENT ILLNESS: From the original visit note:  LYLE LEISNER herself palpated a right breast mass. She underwent bilateral diagnostic mammography with tomography and right breast ultrasonography at [facility] on 01/15/2020 showing: breast density category B; palpable 1.6 cm mass in right breast at 5:30, with adjacent 5 mm probable satellite mass; 0.9 cm mass in retroareolar right breast, also at 5:30; two enlarged lymph nodes in right breast at 9:30.  Accordingly on 01/17/2020 she proceeded to biopsy of the right breast areas in question. The pathology from this procedure ([id number]) showed:  1. Right Breast, 5:30  - invasive mammary carcinoma, grade 2  - ductal carcinoma in situ, low grade.   - Prognostic indicators significant for: estrogen receptor, 95% positive and progesterone receptor, 95% positive, both with strong staining intensity. Proliferation marker Ki67 not obtained. HER2 negative by immunohistochemistry (0). 2. Right Breast, retroareolar  - ductal carcinoma in situ 3. Lymph Node, right axilla  - invasive mammary carcinoma  - lymph node tissue not identified  We do not have an e-cadherin or Ki67 on this tissue.  The patient's subsequent history is as detailed below.   PAST MEDICAL HISTORY: Past Medical History:  Diagnosis Date  . Anxiety    h/o, was prev on zoloft  . Breast cancer ([place]) 01/2020   seeing Dr. Marlou Starks  . Cholelithiasis   .  Diabetes mellitus without complication ([place])   . Family history of breast cancer   . Family history of leukemia   . Family history of lung cancer   . Family history of prostate cancer   . Family history of stomach cancer   . Hypothyroidism    postpartum hypothyroidism  . IUD (intrauterine device) in place    mirena per [employer]  .  Major depression     PAST SURGICAL HISTORY: Past Surgical History:  Procedure Laterality Date  . BREAST RECONSTRUCTION WITH PLACEMENT OF TISSUE EXPANDER AND FLEX HD (ACELLULAR HYDRATED DERMIS) Right 03/30/2020   Procedure: BREAST RECONSTRUCTION WITH PLACEMENT OF TISSUE EXPANDER AND FLEX HD (ACELLULAR HYDRATED DERMIS);  Surgeon: Wallace Going, DO;  Location: [facility];  Service: Plastics;  Laterality: Right;  . BREAST REDUCTION WITH MASTOPEXY Left 05/28/2020   Procedure: BREAST REDUCTION WITH MASTOPEXY;  Surgeon: Wallace Going, DO;  Location: [facility];  Service: Plastics;  Laterality: Left;  . CESAREAN SECTION     X 2 Fetal stress, second scheduled  . CHOLECYSTECTOMY  05/1998  . MASTECTOMY MODIFIED RADICAL Right 03/30/2020   Procedure: RIGHT MASTECTOMY MODIFIED RADICAL;  Surgeon: Jovita Kussmaul, MD;  Location: [facility];  Service: General;  Laterality: Right;  [place], RNFA  . REMOVAL OF TISSUE EXPANDER AND PLACEMENT OF IMPLANT Right 05/28/2020   Procedure: REMOVAL OF TISSUE EXPANDER AND PLACEMENT OF IMPLANT;  Surgeon: Wallace Going, DO;  Location: [facility];  Service: Plastics;  Laterality: Right;  2.5 hours, please    FAMILY HISTORY: Family History  Problem Relation Age of Onset  . Hypertension Father   . Depression Father   . Prostate cancer Father 74       Prostate, S/P prostatectomy, metastatic  . Asthma Sister   . Spina bifida Sister   . Hypothyroidism Mother   . Hypertension Maternal Grandmother   . Hyperlipidemia Maternal Grandmother   . Lung disease Maternal Grandmother   . Lung cancer Maternal Grandmother 74  . Hypothyroidism Maternal Grandfather   . Prostate cancer Maternal Grandfather   . Cancer Maternal Grandfather        lung, stomach, and prostate cancers  . Emphysema Paternal Grandmother   . Diabetes Maternal Uncle   . Diabetes Maternal Uncle   . Leukemia Maternal Uncle 58  .  Prostate cancer Maternal Uncle        dx in his 76s/70s, metastatic  . Breast cancer Other 45       negative genetic testing; maternal first cousin  . Stroke Neg Hx   . Colon cancer Neg Hx    The patient's father died at age 67 from prostate cancer.  The patient's mother is 77 years old as of September 2021.  The patient has 1 uncle with pancreatic cancer and one cousin on the same side of the family with breast cancer.   GYNECOLOGIC HISTORY:  No LMP recorded. (Menstrual status: IUD). Menarche: 50 years old Age at first live birth: 50 years old [place] P 2 Contraceptive Mirena in place Hysterectomy?  No BSO?  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  She is divorced.  She lives alone although her significant other Larita Fife (who is disabled secondary to COPD) sometimes comes over.  Her sons are Aileen Pilot, who is Chief Strategy Officer at [employer], who is also at [employer] considering dentistry or  physical therapy.   ADVANCED DIRECTIVES: Not in place.  The patient intends to name her older son as her healthcare power of attorney and the appropriate documents were giving her on 01/29/2020 for her to complete and notarized at her discretion   HEALTH MAINTENANCE: Social History   Tobacco Use  . Smoking status: Never Smoker  . Smokeless tobacco: Never Used  Vaping Use  . Vaping Use: Never used  Substance Use Topics  . Alcohol use: Yes    Alcohol/week: 0.0 standard drinks    Comment: Very rarely.  . Drug use: No     Colonoscopy:   PAP: Not up-to-date  Bone density: Never   Allergies  Allergen Reactions  . Codeine Nausea And Vomiting  . Zoloft [Sertraline Hcl]     headaches    Current Outpatient Medications  Medication Sig Dispense Refill  . acyclovir ointment (ZOVIRAX) 5 % Apply 1 application topically every 3 (three) hours. As needed. 5 g 5  . cholecalciferol (VITAMIN D3) 25 MCG (1000  UNIT) tablet Take 1,000 Units by mouth daily.    . cyclobenzaprine (FLEXERIL) 10 MG tablet TAKE 1/2 TO 1 TABLET BY MOUTH EVERY DAY AS NEEDED 30 tablet 5  . escitalopram (LEXAPRO) 10 MG tablet Take 1 tablet (10 mg total) by mouth daily.    Marland Kitchen goserelin (ZOLADEX) 3.6 MG injection Inject 3.6 mg into the skin every 28 (twenty-eight) days.    Marland Kitchen ibuprofen (ADVIL,MOTRIN) 200 MG tablet Take 200 mg by mouth every 6 (six) hours as needed.    Marland Kitchen levothyroxine (SYNTHROID) 25 MCG tablet Take 1 tablet (25 mcg total) by mouth daily before breakfast. 90 tablet 3  . tamoxifen (NOLVADEX) 20 MG tablet Take 1 tablet (20 mg total) by mouth daily. 90 tablet 4  . Turmeric (QC TUMERIC COMPLEX PO) Take by mouth.    . valACYclovir (VALTREX) 1000 MG tablet Take 2 tablets (2,000 mg total) by mouth 2 (two) times daily as needed. For 1 day. 20 tablet 5   No current facility-administered medications for this visit.    OBJECTIVE: White woman who appears well  Vitals:   09/15/20 1507  BP: 124/77  Pulse: 74  Resp: 18  Temp: 97.9 F (36.6 C)  SpO2: 99%     Body mass index is 31.62 kg/m.   Wt Readings from Last 3 Encounters:  09/15/20 178 lb 8 oz (81 kg)  09/11/20 178 lb (80.7 kg)  08/19/20 178 lb (80.7 kg)      ECOG FS:1 - Symptomatic but completely ambulatory  Sclerae unicteric, EOMs intact Wearing a mask No cervical or supraclavicular adenopathy Lungs no rales or rhonchi Heart regular rate and rhythm Abd soft, nontender, positive bowel sounds MSK no focal spinal tenderness, no upper extremity lymphedema Neuro: nonfocal, well oriented, appropriate affect Breasts: The right breast is status post mastectomy with silicone implant reconstruction.  It then underwent radiation.  The cosmetic result is good.  There is some hyperpigmentation.  The nipple has not been tattooed yet.  Left breast is status post reduction mammoplasty.  It is healing nicely.  Both axillae are benign.   LAB RESULTS:  CMP     Component  Value Date/Time   NA 140 06/23/2020 1159   K 4.5 06/23/2020 1159   CL 108 06/23/2020 1159   CO2 25 06/23/2020 1159   GLUCOSE 138 (H) 06/23/2020 1159   BUN 14 06/23/2020 1159   CREATININE 0.75 06/23/2020 1159   CALCIUM 9.2 06/23/2020 1159   PROT 7.5  06/23/2020 1159   ALBUMIN 3.8 06/23/2020 1159   AST 43 (H) 06/23/2020 1159   ALT 40 06/23/2020 1159   ALKPHOS 57 06/23/2020 1159   BILITOT 0.4 06/23/2020 1159   GFRNONAA >60 06/23/2020 1159   GFRAA >60 01/29/2020 1526    No results found for: TOTALPROTELP, ALBUMINELP, A1GS, A2GS, BETS, BETA2SER, GAMS, MSPIKE, SPEI  Lab Results  Component Value Date   WBC 7.7 09/15/2020   NEUTROABS 4.8 09/15/2020   HGB 13.7 09/15/2020   HCT 39.7 09/15/2020   MCV 87.6 09/15/2020   PLT 247 09/15/2020    No results found for: LABCA2  No components found for: QIONGE952  No results for input(s): INR in the last 168 hours.  No results found for: LABCA2  No results found for: WUX324  No results found for: MWN027  No results found for: OZD664  No results found for: CA2729  No components found for: HGQUANT  No results found for: CEA1 / No results found for: CEA1   No results found for: AFPTUMOR  No results found for: CHROMOGRNA  No results found for: KPAFRELGTCHN, LAMBDASER, KAPLAMBRATIO (kappa/lambda light chains)  No results found for: HGBA, HGBA2QUANT, HGBFQUANT, HGBSQUAN (Hemoglobinopathy evaluation)   No results found for: LDH  No results found for: IRON, TIBC, IRONPCTSAT (Iron and TIBC)  No results found for: FERRITIN  Urinalysis    Component Value Date/Time   BILIRUBINUR negative 02/25/2015 1645   PROTEINUR + 0.15 02/25/2015 1645   UROBILINOGEN 0.2 02/25/2015 1645   NITRITE negative 02/25/2015 1645   LEUKOCYTESUR small (1+) (A) 02/25/2015 1645    STUDIES: No results found.   ELIGIBLE FOR AVAILABLE RESEARCH PROTOCOL: AET  ASSESSMENT: 50 y.o. [place] woman status post right breast lower outer quadrant  biopsy 01/17/2020  for a clinical T1c N1, stage IB invasive mammary carcinoma, grade 2, estrogen and progesterone receptor positive, HER-2 not amplified  (a) biopsy of the left axillary "lymph node" was positive but included no lymph node tissue  (b) biopsy of a suspicious periareoral area was positive for ductal carcinoma in situ, grade 1  (c) biopsy of a left breast LOQ mass noted on 02/06/2020 breast MRI pending  (d) CT chest and bone scan 02/11/2020 show no evidence of metastatic disease  (1) genetics testing 02/15/2020 through the [facility] Common Hereditary Cancers Panel.  Found no deleterious mutations in APC, ATM, AXIN2, BARD1, BMPR1A, BRCA1, BRCA2, BRIP1, CDH1, CDK4, CDKN2A (p14ARF), CDKN2A (p16INK4a), CHEK2, CTNNA1, DICER1, EPCAM (Deletion/duplication testing only), GREM1 (promoter region deletion/duplication testing only), KIT, MEN1, MLH1, MSH2, MSH3, MSH6, MUTYH, NBN, NF1, NHTL1, PALB2, PDGFRA, PMS2, POLD1, POLE, PTEN, RAD50, RAD51C, RAD51D, RNF43, SDHB, SDHC, SDHD, SMAD4, SMARCA4. STK11, TP53, TSC1, TSC2, and VHL.  The following genes were evaluated for sequence changes only: SDHA and HOXB13 c.251G>A variant only.   (2) MammaPrint obtained from the 01/17/2020 biopsy read as "low risk" predicting a 96% chance of distant disease free survival at 5 years without the need for chemotherapy  (3) tamoxifen started 02/13/2020 in anticipation of surgical delays  (a) started ovarian suppression 04/30/2020 with monthly goserelin/Zoladex  (4) status post right modified radical mastectomy 03/30/2020 for a pT3 pN1, stage IIA invasive ductal carcinoma, grade 2, with evidence of lymphovascular space involvement but negative margins  (a) a total of 9 right axillary lymph nodes removed, 2 positive with focal extracapsular extension  (b) status post right breast reconstruction 05/28/2020 with a [place] Smooth Round Ultra High Profile Gel 650 cc. Ref #[id number].  Serial Number [id number]; left mastopexy  same  date  (5) adjuvant radiation completed 08/25/2020    PLAN: Laritza is done with local therapy for her breast cancer.  She did well with her surgery and radiation and she is pleased with her reconstruction.  She does not know if she wants to have a nipple belt or even tattooed but she is leaning towards the latter.  She is tolerating tamoxifen well and the plan will be to continue that a total of 5 years.  She is also doing well on the goserelin.  We will continue that for 5 years as well.  Even though her tumor was "low risk" on the MammaPrint, nevertheless given her positive lymph nodes I think ovarian suppression probably would accomplish pretty much the same thing that chemotherapy would in this setting.  She will have her left mammogram in early November and I will see her later that month.  I am adding gabapentin to take at bedtime to see if we can help her sleep a little bit better with fewer hot flashes at night.  She knows to call for any other issue that may develop before then  Total encounter time 25 minutes.Sarajane Jews C. Danni Shima, MD 09/15/2020 3:16 PM Medical Oncology and Hematology [facility] [place], [place] [address] Tel. [phone]    Fax. [phone]   This document serves as a record of services personally performed by Lurline Del, MD. It was created on his behalf by Wilburn Mylar, a trained medical scribe. The creation of this record is based on the scribe's personal observations and the provider's statements to them.   I, Lurline Del MD, have reviewed the above documentation for accuracy and completeness, and I agree with the above.   *Total Encounter Time as defined by the Centers for Medicare and Medicaid Services includes, in addition to the face-to-face time of a patient visit (documented in the note above) non-face-to-face time: obtaining and reviewing outside history, ordering and reviewing medications, tests or  procedures, care coordination (communications with other health care professionals or caregivers) and documentation in the medical record.

## 2020-09-18 ENCOUNTER — Telehealth: Payer: Self-pay | Admitting: Oncology

## 2020-09-18 NOTE — Telephone Encounter (Signed)
Scheduled per 5/3 los. Pt will receive an updated appt calendar   

## 2020-09-20 ENCOUNTER — Other Ambulatory Visit: Payer: Self-pay

## 2020-09-20 ENCOUNTER — Ambulatory Visit
Admission: RE | Admit: 2020-09-20 | Discharge: 2020-09-20 | Disposition: A | Discharge status: Home / Self Care | Payer: No Typology Code available for payment source | Source: Ambulatory Visit | Attending: Oncology | Admitting: Oncology

## 2020-09-20 DIAGNOSIS — C50511 Malignant neoplasm of lower-outer quadrant of right female breast: Secondary | ICD-10-CM

## 2020-09-20 DIAGNOSIS — Z170 Estrogen receptor positive status [ER+]: Secondary | ICD-10-CM

## 2020-09-20 IMAGING — MR MR BREAST*L* WO/W CM
8 of 12 series · 30 of 48 positions shown · IV contrast (8 ml Gadavist)
Comparison: Previous exams.
COMPARISON: Previous exams.

Addendum:
CLINICAL DATA: 49-year-old female with right breast cancer
diagnosed [DATE] post mastectomy [DATE] and subsequent
implant reconstruction early [AI]. The patient presents for
follow-up of a 0.5 cm mass in the lower inner left breast identified
on MRI dated [DATE]. With the patient returned for biopsy
[DATE] the mass in the left breast was not visualized and
therefore six-month follow-up breast MRI was recommended to ensure
stable findings.

LABS:  Not applicable.
EXAM:
MR OF THE LEFT BREAST WITH AND WITHOUT CONTRAST
TECHNIQUE: Multiplanar multisequence MR images of the left breast were obtained
prior to and following the intravenous administration of 8 ml of
Gadavist.

[Series 2: t2_tirm_tra ipat (a-p) · axial · 3.0mm · 0.78mm/px · 1 of 62 slices shown]
[im 1/62]
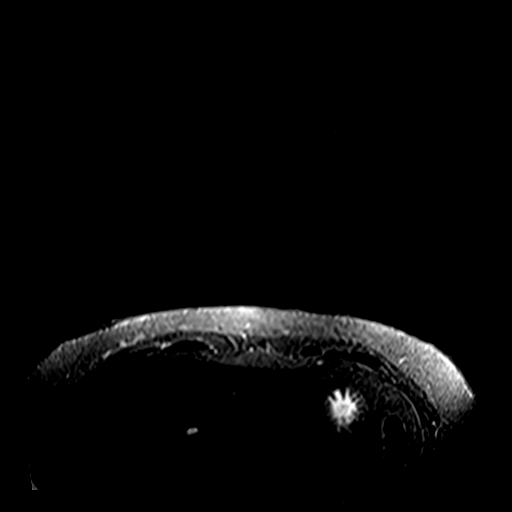

[Series 3: fl3d pre-cm no · axial · non-contrast · 1.2mm · 1.04mm/px · z∈[-98,+74]mm · 5 of 144 slices shown]
[im 1/144]
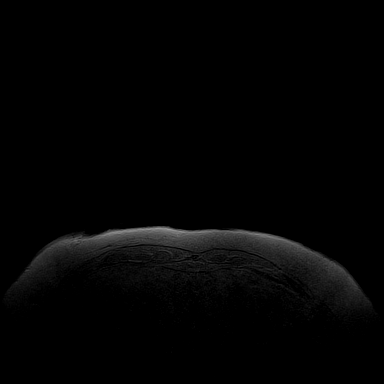
[im 36/144]
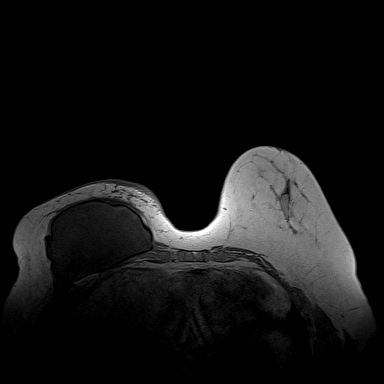
[im 72/144]
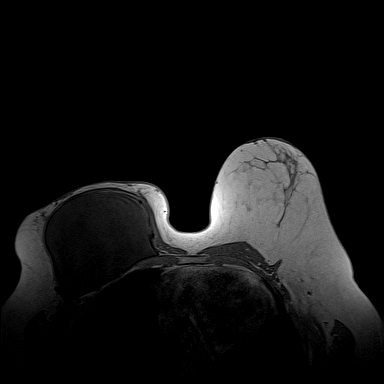
[im 108/144]
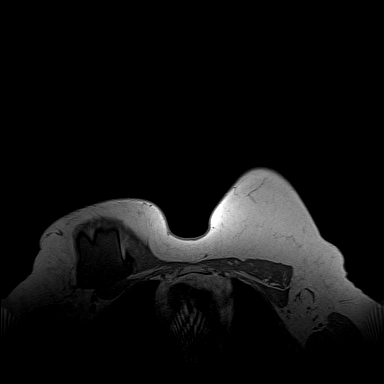
[im 144/144]
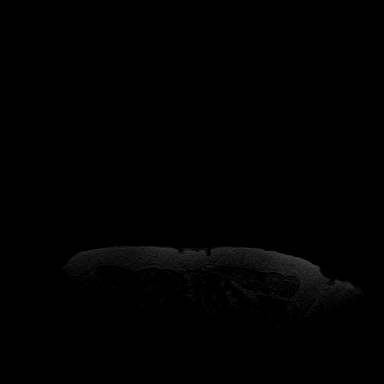

[Series 11: fl3d pre-cm · axial · non-contrast · 1.2mm · 1.04mm/px · z∈[-98,+74]mm · 5 of 144 slices shown]
[im 1/144]
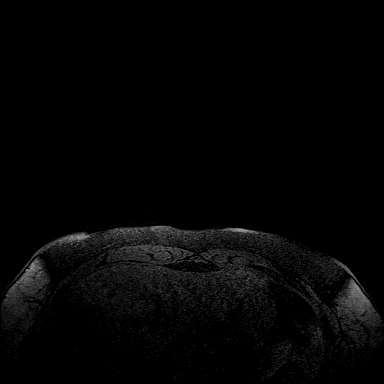
[im 36/144]
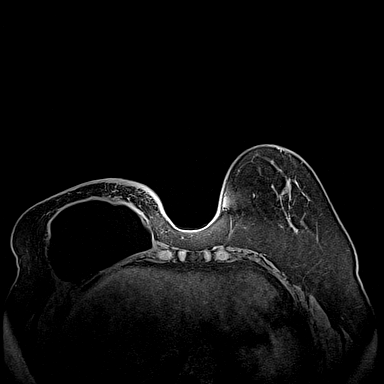
[im 72/144]
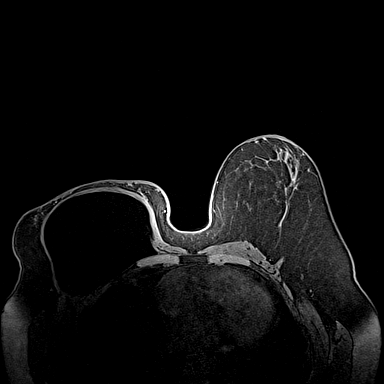
[im 108/144]
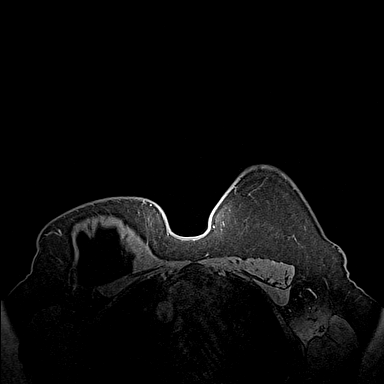
[im 144/144]
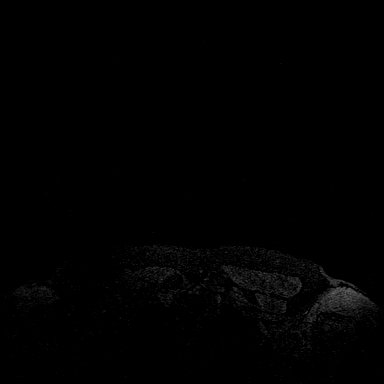

[Series 12: fl3d post-cm 20 · axial · 1.2mm · 1.04mm/px · z∈[-98,+74]mm · 5 of 144 slices shown (1 of 3)]
[im 1/144]
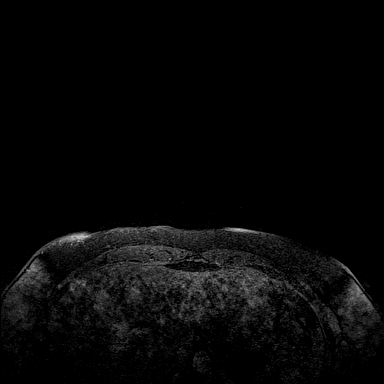
[im 36/144]
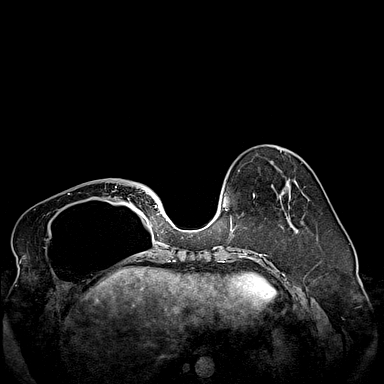
[im 72/144]
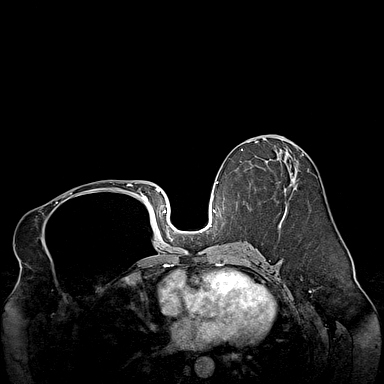
[im 108/144]
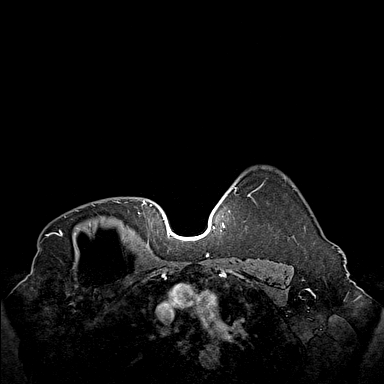
[im 144/144]
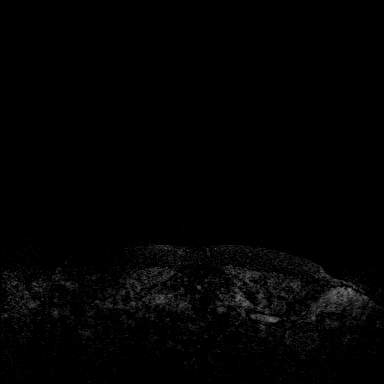

[Series 13: fl3d post-cm 20 · axial · 1.2mm · 1.04mm/px · z∈[-98,+74]mm · 5 of 144 slices shown (2 of 3)]
[im 1/144]
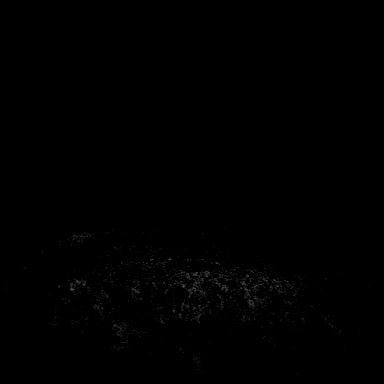
[im 36/144]
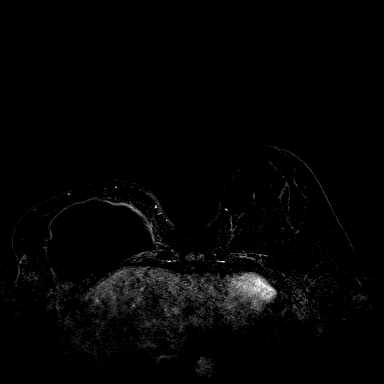
[im 72/144]
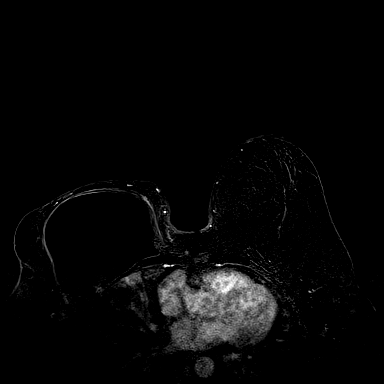
[im 108/144]
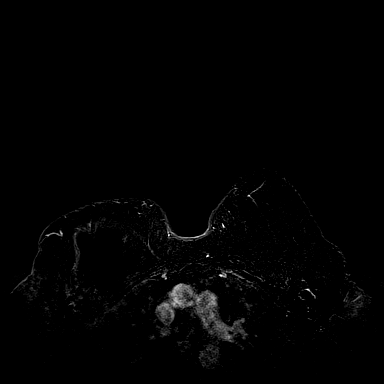
[im 144/144]
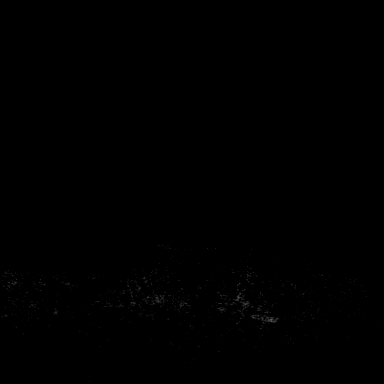

[Series 14: fl3d post-cm 20 · axial · 172.8mm · 1.04mm/px · 1 of 1 slices shown (3 of 3)]
[im 1/1]
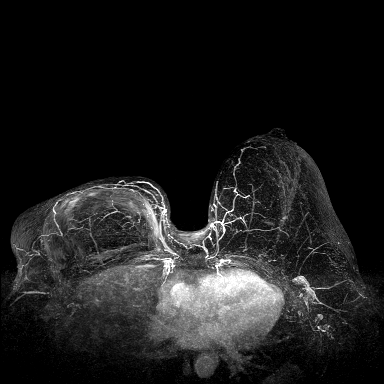

[Series 15: fl3d post-cm 3min · axial · 1.2mm · 1.04mm/px · z∈[-98,+74]mm · 6 of 144 slices shown]
[im 1/144]
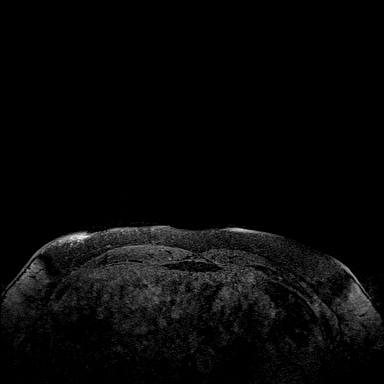
[im 29/144]
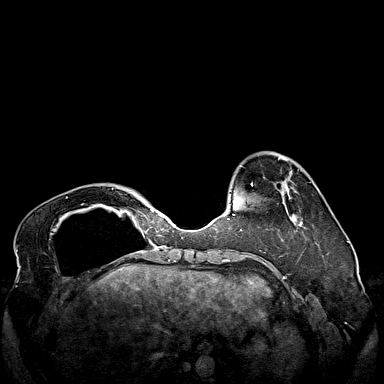
[im 58/144]
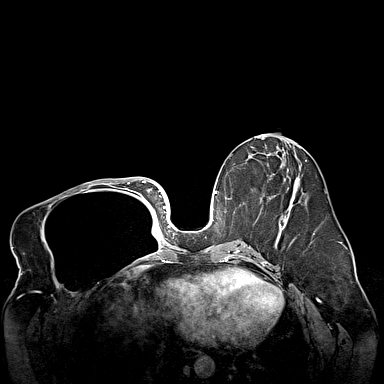
[im 86/144]
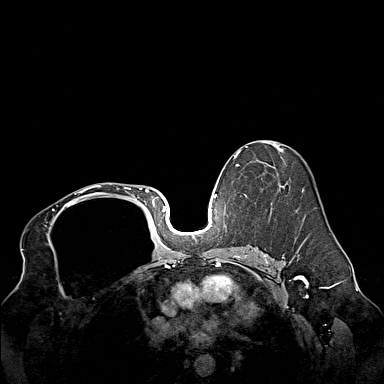
[im 115/144]
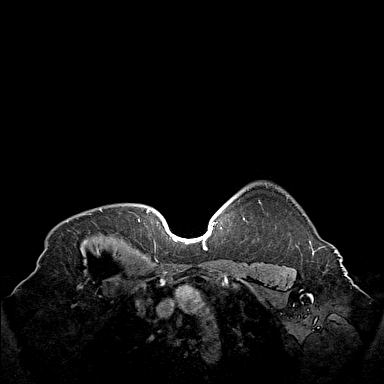
[im 144/144]
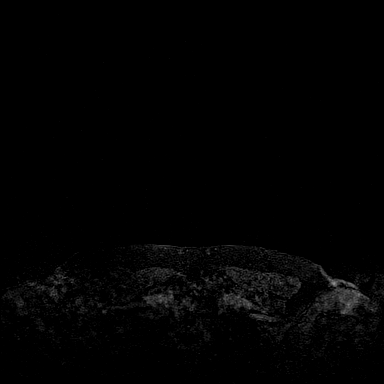

[Series 16: fl3d post-cm 3min_sub · axial · 1.2mm · 1.04mm/px · z∈[-98,-64]mm · 2 of 144 slices shown]
[im 1/144]
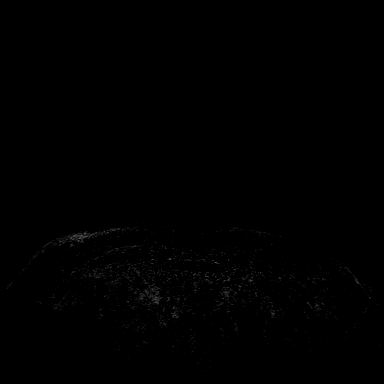
[im 29/144]
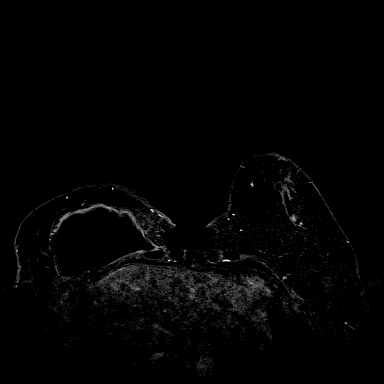

[30 of 48 positions shown; findings below may reference images not displayed]

Three-dimensional MR images were rendered by post-processing of the
original MR data on an independent workstation. The
three-dimensional MR images were interpreted, and findings are
reported in the following complete MRI report for this study. Three
dimensional images were evaluated at the independent [employer]
workstation
FINDINGS: Breast composition: b.  Scattered fibroglandular tissue.

Background parenchymal enhancement: Mild.

Right breast: Interval right breast mastectomy with implant
reconstruction with expected postsurgical change and small amount of
dependent fluid noted surrounding the right breast implant. There
are no rapidly enhancing masses or abnormal areas of enhancement in
the reconstructed right breast to suggest malignancy.

Left breast: The previously seen 0.5 cm enhancing mass in the lower
inner left breast is not identified on today's images. No suspicious
rapidly enhancing masses or abnormal areas of enhancement identified
in the left breast to suggest malignancy.

Lymph nodes: No morphologically abnormal lymph nodes identified.

Ancillary findings:  None.
IMPRESSION: 1. Previously seen 0.5 cm enhancing mass in the lower inner left
breast is no longer identified.

2. New postsurgical changes in the right breast. No MRI evidence of
malignancy in the reconstructed right breast.

RECOMMENDATION:
[DATE].

BI-RADS CATEGORY  2: Benign.

ADDENDUM:
An addendum is made to the recommendation portion of this exam due
to a typographical error. The patient has had a right mastectomy and
therefore the recommendation should be: Recommend screening
mammography of the left breast only which will be due [DATE].

The remainder of the report is correct.

*** End of Addendum ***
Three-dimensional MR images were rendered by post-processing of the
original MR data on an independent workstation. The
three-dimensional MR images were interpreted, and findings are
reported in the following complete MRI report for this study. Three
dimensional images were evaluated at the independent [employer]
workstation
FINDINGS: Breast composition: b.  Scattered fibroglandular tissue.

Background parenchymal enhancement: Mild.

Right breast: Interval right breast mastectomy with implant
reconstruction with expected postsurgical change and small amount of
dependent fluid noted surrounding the right breast implant. There
are no rapidly enhancing masses or abnormal areas of enhancement in
the reconstructed right breast to suggest malignancy.

Left breast: The previously seen 0.5 cm enhancing mass in the lower
inner left breast is not identified on today's images. No suspicious
rapidly enhancing masses or abnormal areas of enhancement identified
in the left breast to suggest malignancy.

Lymph nodes: No morphologically abnormal lymph nodes identified.

Ancillary findings:  None.
IMPRESSION: 1. Previously seen 0.5 cm enhancing mass in the lower inner left
breast is no longer identified.

2. New postsurgical changes in the right breast. No MRI evidence of
malignancy in the reconstructed right breast.

RECOMMENDATION:
[DATE].

BI-RADS CATEGORY  2: Benign.

## 2020-09-20 MED ORDER — GADOBUTROL 1 MMOL/ML IV SOLN
8.0000 mL | Freq: Once | INTRAVENOUS | Status: AC | PRN
  Administered 2020-09-20: 8 mL via INTRAVENOUS

## 2020-09-22 ENCOUNTER — Telehealth (INDEPENDENT_AMBULATORY_CARE_PROVIDER_SITE_OTHER): Payer: No Typology Code available for payment source | Admitting: Primary Care

## 2020-09-22 ENCOUNTER — Other Ambulatory Visit: Payer: Self-pay

## 2020-09-22 ENCOUNTER — Encounter: Payer: Self-pay | Admitting: Primary Care

## 2020-09-22 VITALS — Temp 98.5°F | Ht 63.0 in | Wt 178.0 lb

## 2020-09-22 DIAGNOSIS — J0100 Acute maxillary sinusitis, unspecified: Secondary | ICD-10-CM | POA: Diagnosis not present

## 2020-09-22 MED ORDER — AZITHROMYCIN 250 MG PO TABS
ORAL_TABLET | ORAL | 0 refills | Status: DC
Start: 2020-09-22 — End: 2021-01-14

## 2020-09-22 NOTE — Assessment & Plan Note (Signed)
Acute symptoms of 10 days, no improvement on OTC treatment.  Given duration of symptoms, coupled with history, will treat with Azithromycin as this as worked well historically.   She will update if no improvement in 2-3 days, would consider Covid testing if no improvement, discussed this today.

## 2020-09-22 NOTE — Progress Notes (Signed)
Patient ID: Christy Ferrell, female    DOB: December 01, 1970, 50 y.o.   MRN: [id number]  Virtual visit completed through [employer], a video enabled telemedicine application. Due to national recommendations of social distancing due to COVID-19, a virtual visit is felt to be most appropriate for this patient at this time. Reviewed limitations, risks, security and privacy concerns of performing a virtual visit and the availability of in person appointments. I also reviewed that there may be a patient responsible charge related to this service. The patient agreed to proceed.   Patient location: home Provider location: [place] at [facility], office Persons participating in this virtual visit: patient, provider   If any vitals were documented, they were collected by patient at home unless specified below.    Temp 98.5 F (36.9 C) (Tympanic)   Ht 5\' 3"  (1.6 m)   Wt 178 lb (80.7 kg)   BMI 31.53 kg/m    CC: Sinus pressure Subjective:   HPI: Christy Ferrell is a 50 y.o. female patient of Dr. Damita Dunnings with a history of type 2 diabetes, hypothyroidism, HSV presenting to discuss sinus pressure.  Symptoms began about 10 days ago with itchy/watery eyes, rhinorrhea, cough. Several days ago she began to develop sinus pressure to the maxillary region bilaterally. Since then symptoms have progressed.  She's tried Claritin, Benadryl, Dayquil, and Sudafed with temporary improvement. Overall she's beginning to feel worse in terms of her sinus pressure. She's been treated for sinusitis in the past, has done well on azithromycin. Symptoms today feel like prior sinusitis.       Relevant past medical, surgical, family and social history reviewed and updated as indicated. Interim medical history since our last visit reviewed. Allergies and medications reviewed and updated. Outpatient Medications Prior to Visit  Medication Sig Dispense Refill  . acyclovir ointment (ZOVIRAX) 5 % Apply 1 application topically  every 3 (three) hours. As needed. 5 g 5  . cholecalciferol (VITAMIN D3) 25 MCG (1000 UNIT) tablet Take 1,000 Units by mouth daily.    . cyclobenzaprine (FLEXERIL) 10 MG tablet TAKE 1/2 TO 1 TABLET BY MOUTH EVERY DAY AS NEEDED 30 tablet 5  . escitalopram (LEXAPRO) 10 MG tablet Take 1 tablet (10 mg total) by mouth daily.    Marland Kitchen gabapentin (NEURONTIN) 300 MG capsule Take 1 capsule (300 mg total) by mouth at bedtime. 90 capsule 4  . goserelin (ZOLADEX) 3.6 MG injection Inject 3.6 mg into the skin every 28 (twenty-eight) days.    Marland Kitchen ibuprofen (ADVIL,MOTRIN) 200 MG tablet Take 200 mg by mouth every 6 (six) hours as needed.    Marland Kitchen levothyroxine (SYNTHROID) 25 MCG tablet Take 1 tablet (25 mcg total) by mouth daily before breakfast. 90 tablet 3  . tamoxifen (NOLVADEX) 20 MG tablet Take 1 tablet (20 mg total) by mouth daily. 90 tablet 4  . Turmeric (QC TUMERIC COMPLEX PO) Take by mouth.    . valACYclovir (VALTREX) 1000 MG tablet Take 2 tablets (2,000 mg total) by mouth 2 (two) times daily as needed. For 1 day. 20 tablet 5   No facility-administered medications prior to visit.     Per HPI unless specifically indicated in ROS section below Review of Systems Objective:  Temp 98.5 F (36.9 C) (Tympanic)   Ht 5\' 3"  (1.6 m)   Wt 178 lb (80.7 kg)   BMI 31.53 kg/m   Wt Readings from Last 3 Encounters:  09/22/20 178 lb (80.7 kg)  09/15/20 178 lb 8 oz (81 kg)  09/11/20 178 lb (80.7 kg)       Physical exam: Gen: alert, NAD, not ill appearing Pulm: speaks in complete sentences without increased work of breathing Psych: normal mood, normal thought content      Results for orders placed or performed in visit on 09/15/20  CBC with Differential/Platelet  Result Value Ref Range   WBC 7.7 4.0 - 10.5 K/uL   RBC 4.53 3.87 - 5.11 MIL/uL   Hemoglobin 13.7 12.0 - 15.0 g/dL   HCT 39.7 36.0 - 46.0 %   MCV 87.6 80.0 - 100.0 fL   MCH 30.2 26.0 - 34.0 pg   MCHC 34.5 30.0 - 36.0 g/dL   RDW 12.9 11.5 - 15.5 %    Platelets 247 150 - 400 K/uL   nRBC 0.0 0.0 - 0.2 %   Neutrophils Relative % 63 %   Neutro Abs 4.8 1.7 - 7.7 K/uL   Lymphocytes Relative 28 %   Lymphs Abs 2.2 0.7 - 4.0 K/uL   Monocytes Relative 7 %   Monocytes Absolute 0.5 0.1 - 1.0 K/uL   Eosinophils Relative 1 %   Eosinophils Absolute 0.1 0.0 - 0.5 K/uL   Basophils Relative 1 %   Basophils Absolute 0.0 0.0 - 0.1 K/uL   Immature Granulocytes 0 %   Abs Immature Granulocytes 0.02 0.00 - 0.07 K/uL  CMP (Cancer Center only)  Result Value Ref Range   Sodium 140 135 - 145 mmol/L   Potassium 3.9 3.5 - 5.1 mmol/L   Chloride 107 98 - 111 mmol/L   CO2 22 22 - 32 mmol/L   Glucose, Bld 127 (H) 70 - 99 mg/dL   BUN 18 6 - 20 mg/dL   Creatinine 0.60 0.44 - 1.00 mg/dL   Calcium 9.7 8.9 - 10.3 mg/dL   Total Protein 7.4 6.5 - 8.1 g/dL   Albumin 4.0 3.5 - 5.0 g/dL   AST 37 15 - 41 U/L   ALT 32 0 - 44 U/L   Alkaline Phosphatase 52 38 - 126 U/L   Total Bilirubin 0.4 0.3 - 1.2 mg/dL   GFR, Estimated >60 >60 mL/min   Anion gap 11 5 - 15   Assessment & Plan:   Problem List Items Addressed This Visit      Respiratory   Acute non-recurrent maxillary sinusitis - Primary    Acute symptoms of 10 days, no improvement on OTC treatment.  Given duration of symptoms, coupled with history, will treat with Azithromycin as this as worked well historically.   She will update if no improvement in 2-3 days, would consider Covid testing if no improvement, discussed this today.       Relevant Medications   azithromycin (ZITHROMAX) 250 MG tablet       Meds ordered this encounter  Medications  . azithromycin (ZITHROMAX) 250 MG tablet    Sig: Take 2 tablets by mouth today, then 1 tablet daily for 4 additional days.    Dispense:  6 tablet    Refill:  0    Order Specific Question:   Supervising Provider    Answer:   BEDSOLE, AMY E [[id number]]   No orders of the defined types were placed in this encounter.   I discussed the assessment and treatment plan  with the patient. The patient was provided an opportunity to ask questions and all were answered. The patient agreed with the plan and demonstrated an understanding of the instructions. The patient was advised to call back or seek an  in-person evaluation if the symptoms worsen or if the condition fails to improve as anticipated.  Follow up plan:  Start Azithromycin antibiotics for infection. Take 2 tablets by mouth today, then 1 tablet daily for 4 additional days.  Please notify us in 2-3 days if no improvement.   It was a pleasure meeting you! Allie Bossier, NP-C   Pleas Koch, NP

## 2020-09-22 NOTE — Patient Instructions (Signed)
Start Azithromycin antibiotics for infection. Take 2 tablets by mouth today, then 1 tablet daily for 4 additional days.  Please notify us in 2-3 days if no improvement.   It was a pleasure meeting you! Allie Bossier, NP-C

## 2020-09-24 LAB — ESTRADIOL, ULTRA SENS: Estradiol, Sensitive: 11.5 pg/mL

## 2020-09-25 ENCOUNTER — Ambulatory Visit
Admission: RE | Admit: 2020-09-25 | Discharge: 2020-09-25 | Disposition: A | Discharge status: Home / Self Care | Payer: No Typology Code available for payment source | Source: Ambulatory Visit | Attending: Radiation Oncology | Admitting: Radiation Oncology

## 2020-09-25 ENCOUNTER — Other Ambulatory Visit: Payer: Self-pay

## 2020-09-25 VITALS — BP 129/75 | HR 81 | Temp 97.7°F | Resp 18 | Ht 63.0 in | Wt 178.8 lb

## 2020-09-25 DIAGNOSIS — I890 Lymphedema, not elsewhere classified: Secondary | ICD-10-CM | POA: Insufficient documentation

## 2020-09-25 DIAGNOSIS — C50511 Malignant neoplasm of lower-outer quadrant of right female breast: Secondary | ICD-10-CM | POA: Insufficient documentation

## 2020-09-25 DIAGNOSIS — Z79810 Long term (current) use of selective estrogen receptor modulators (SERMs): Secondary | ICD-10-CM | POA: Insufficient documentation

## 2020-09-25 DIAGNOSIS — R232 Flushing: Secondary | ICD-10-CM | POA: Diagnosis not present

## 2020-09-25 DIAGNOSIS — Z9011 Acquired absence of right breast and nipple: Secondary | ICD-10-CM | POA: Diagnosis not present

## 2020-09-25 DIAGNOSIS — Z170 Estrogen receptor positive status [ER+]: Secondary | ICD-10-CM | POA: Insufficient documentation

## 2020-09-25 DIAGNOSIS — Z79899 Other long term (current) drug therapy: Secondary | ICD-10-CM | POA: Insufficient documentation

## 2020-09-25 DIAGNOSIS — R200 Anesthesia of skin: Secondary | ICD-10-CM | POA: Diagnosis not present

## 2020-09-25 NOTE — Progress Notes (Signed)
Christy Ferrell presents today for follow up after completing radiation to her right breast on 08/25/2020  Pain: Reports residual numbness to right breast that is uncomfortable, but denies any pain/discomfort.  Skin: States she had some blistering and peeling, but skin is now well healed and intact in treatment field. ROM: Denies any restrictions, but does state she can feel a pulling/tightness in her axilla region. She is trying to coordinate with PT to resume sessions to help manage Lymphedema: States she can feel some mild swelling under her breast. States she recently had an MRI of her breast 09/20/2020 IMPRESSION: 1. Previously seen 0.5 cm enhancing mass in the lower inner left breast is no longer identified. 2. New postsurgical changes in the right breast. No MRI evidence of malignancy in the reconstructed right breast. MedOnc F/U: 03/23/2021 F/U with Dr. Lurline Del Other issues of note: Denies any difficulty sleeping or changes in appetite. Overall she reports she feels well and is pleased with her recovery thus far.  Pt reports Yes No Comments  Tamoxifen [x]  []  Taking Gabapentin at night to help with hot flashes  Letrozole []  [x]    Anastrazole []  [x]    Mammogram []  Date: November 2022 [] 

## 2020-09-29 ENCOUNTER — Encounter: Payer: Self-pay | Admitting: Radiation Oncology

## 2020-09-29 NOTE — Progress Notes (Addendum)
Radiation Oncology         [phone] ________________________________  Name: Christy Ferrell MRN: [id number]  Date: 09/25/2020  DOB: [birth year]  Follow-Up Visit Note  Outpatient  CC: Christy Ghent, MD  Magrinat, Christy Dad, MD  Diagnosis and Prior Radiotherapy:    ICD-10-CM   1. Malignant neoplasm of lower-outer quadrant of right breast of female, estrogen receptor positive ([place])  C50.511    Z17.0    Cancer Staging No matching staging information was found for the patient.  pT3, pN1a, M0 Right Breast LIQ Invasive Ductal Carcinoma with DCIS, ER+ / PR+ / Her2-, Grade 2  CHIEF COMPLAINT: Here for follow-up and surveillance of right breast cancer  Narrative:  The patient returns today for routine follow-up.  Christy Ferrell presents today for follow up after completing radiation to her right breast on 08/25/2020  Pain: Reports residual numbness to right breast that is uncomfortable, but denies any pain/discomfort.  Skin: States she had some blistering and peeling, but skin is now well healed and intact in treatment field. ROM: Denies any restrictions, but does state she can feel a pulling/tightness in her axilla region. She is trying to coordinate with PT to resume sessions to help manage Lymphedema: States she can feel some mild swelling under her breast. States she recently had an MRI of her breast 09/20/2020 (I have personally reviewed this) IMPRESSION: 1. Previously seen 0.5 cm enhancing mass in the lower inner left breast is no longer identified. 2. New postsurgical changes in the right breast. No MRI evidence of malignancy in the reconstructed right breast. MedOnc F/U: 03/23/2021 F/U with Dr. Lurline Ferrell Other issues of note: Denies any difficulty sleeping or changes in appetite. Overall she reports she feels well and is pleased with her recovery thus far.  Pt reports Yes No Comments  Tamoxifen [x] [] Taking Gabapentin at night to help with hot flashes  Letrozole [] [x]    Anastrazole [] [x]   Mammogram [] Date: November 2022 []                                  ALLERGIES:  is allergic to codeine and zoloft [sertraline hcl].  Meds: Current Outpatient Medications  Medication Sig Dispense Refill  . acyclovir ointment (ZOVIRAX) 5 % Apply 1 application topically every 3 (three) hours. As needed. 5 g 5  . azithromycin (ZITHROMAX) 250 MG tablet Take 2 tablets by mouth today, then 1 tablet daily for 4 additional days. 6 tablet 0  . cholecalciferol (VITAMIN D3) 25 MCG (1000 UNIT) tablet Take 1,000 Units by mouth daily.    . cyclobenzaprine (FLEXERIL) 10 MG tablet TAKE 1/2 TO 1 TABLET BY MOUTH EVERY DAY AS NEEDED 30 tablet 5  . escitalopram (LEXAPRO) 10 MG tablet Take 1 tablet (10 mg total) by mouth daily.    Marland Kitchen gabapentin (NEURONTIN) 300 MG capsule Take 1 capsule (300 mg total) by mouth at bedtime. 90 capsule 4  . goserelin (ZOLADEX) 3.6 MG injection Inject 3.6 mg into the skin every 28 (twenty-eight) days.    Marland Kitchen ibuprofen (ADVIL,MOTRIN) 200 MG tablet Take 200 mg by mouth every 6 (six) hours as needed.    Marland Kitchen levothyroxine (SYNTHROID) 25 MCG tablet Take 1 tablet (25 mcg total) by mouth daily before breakfast. 90 tablet 3  . tamoxifen (NOLVADEX) 20 MG tablet Take 1 tablet (20 mg total) by mouth daily. 90 tablet 4  . Turmeric (  QC TUMERIC COMPLEX PO) Take by mouth.    . valACYclovir (VALTREX) 1000 MG tablet Take 2 tablets (2,000 mg total) by mouth 2 (two) times daily as needed. For 1 day. 20 tablet 5   No current facility-administered medications for this encounter.    Physical Findings: The patient is in no acute distress. Patient is alert and oriented.  height is 5' 3" (1.6 m) and weight is 178 lb 12.8 oz (81.1 kg). Her temperature is 97.7 F (36.5 C). Her blood pressure is 129/75 and her pulse is 81. Her respiration is 18 and oxygen saturation is 98%. .    Satisfactory skin healing in radiotherapy fields.     Lab Findings: Lab Results  Component Value Date    WBC 7.7 09/15/2020   HGB 13.7 09/15/2020   HCT 39.7 09/15/2020   MCV 87.6 09/15/2020   PLT 247 09/15/2020    Radiographic Findings: MR BREAST LEFT W WO CONTRAST INC CAD  Addendum Date: 09/21/2020   ADDENDUM REPORT: 09/21/2020 12:25 ADDENDUM: An addendum is made to the recommendation portion of this exam due to a typographical error. The patient has had a right mastectomy and therefore the recommendation should be: Recommend screening mammography of the left breast only which will be due September 2022. The remainder of the report is correct. Electronically Signed   By: Everlean Alstrom M.D.   On: 09/21/2020 12:25   Result Date: 09/21/2020 CLINICAL DATA:  50 year old female with right breast cancer diagnosed 01/22/2020 post mastectomy 03/30/2020 and subsequent implant reconstruction early 2022. The patient presents for follow-up of a 0.5 cm mass in the lower inner left breast identified on MRI dated 02/06/2020. With the patient returned for biopsy 02/19/2020 the mass in the left breast was not visualized and therefore six-month follow-up breast MRI was recommended to ensure stable findings. LABS:  Not applicable. EXAM: MR OF THE LEFT BREAST WITH AND WITHOUT CONTRAST TECHNIQUE: Multiplanar multisequence MR images of the left breast were obtained prior to and following the intravenous administration of 8 ml of Gadavist. Three-dimensional MR images were rendered by post-processing of the original MR data on an independent workstation. The three-dimensional MR images were interpreted, and findings are reported in the following complete MRI report for this study. Three dimensional images were evaluated at the independent Dyna[employer]ad workstation COMPARISON:  Previous exams. FINDINGS: Breast composition: b.  Scattered fibroglandular tissue. Background parenchymal enhancement: Mild. Right breast: Interval right breast mastectomy with implant reconstruction with expected postsurgical change and small amount of dependent  fluid noted surrounding the right breast implant. There are no rapidly enhancing masses or abnormal areas of enhancement in the reconstructed right breast to suggest malignancy. Left breast: The previously seen 0.5 cm enhancing mass in the lower inner left breast is not identified on today's images. No suspicious rapidly enhancing masses or abnormal areas of enhancement identified in the left breast to suggest malignancy. Lymph nodes: No morphologically abnormal lymph nodes identified. Ancillary findings:  None. IMPRESSION: 1. Previously seen 0.5 cm enhancing mass in the lower inner left breast is no longer identified. 2. New postsurgical changes in the right breast. No MRI evidence of malignancy in the reconstructed right breast. RECOMMENDATION: Patient will be due for annual bilateral diagnostic mammography September 2022. BI-RADS CATEGORY  2: Benign. Electronically Signed: By: Everlean Alstrom M.D. On: 09/21/2020 11:06    Impression/Plan: Healing well from radiotherapy for breast cancer  Continue skin care with topical Vitamin E Oil and / or lotion for at least 2 more  months for further healing.  I encouraged her to continue followup with medical oncology. I will see her back on an as-needed basis. I have encouraged her to call if she has any issues or concerns in the future. I wished her the very best.  On date of service, in total, I spent 15 minutes on this encounter. Patient was seen in person.  _____________________________________   Eppie Gibson, MD

## 2020-10-06 ENCOUNTER — Other Ambulatory Visit: Payer: Self-pay

## 2020-10-06 ENCOUNTER — Ambulatory Visit (INDEPENDENT_AMBULATORY_CARE_PROVIDER_SITE_OTHER): Payer: No Typology Code available for payment source | Admitting: Surgical

## 2020-10-06 DIAGNOSIS — Z9011 Acquired absence of right breast and nipple: Secondary | ICD-10-CM

## 2020-10-06 DIAGNOSIS — Z9889 Other specified postprocedural states: Secondary | ICD-10-CM | POA: Diagnosis not present

## 2020-10-06 NOTE — Progress Notes (Signed)
   Subjective:     Patient ID: Christy Ferrell, female    DOB: 1970/09/07, 50 y.o.   MRN: [id number]  Chief Complaint  Patient presents with  . Follow-up    HPI: The patient is a 50 y.o. female here for follow-up on her right breast reconstruction after mastectomy and left breast mastopexy/reduction for symmetry.  She has a 650 cc gel implant on the right  She has completed radiation treatment of the right breast and reports that she is doing really well.  She reports a significant improvement in the redness and hyperpigmentation from the radiation.  She is overall very pleased with how things are going.  Review of Systems  Constitutional: Negative.   Skin: Positive for color change. Negative for rash and wound.     Objective:   Vital Signs There were no vitals taken for this visit. Vital Signs and Nursing Note Reviewed Chaperone present Physical Exam Constitutional:      General: She is not in acute distress.    Appearance: Normal appearance. She is not ill-appearing.  Chest:       Comments: Right breast incision is intact and well-healed.  She does have some hyperpigmentation noted from the radiation.  No erythema.  Right breast implant has continued to settle. Neurological:     Mental Status: She is alert.       Assessment/Plan:     ICD-10-CM   1. H/O mastopexy  Z98.890   2. S/P breast reconstruction, right  Z98.890   3. Acquired absence of right breast  Z40.3     50 year old female status post right breast reconstruction and left breast mastopexy for symmetry.  She is doing really well after completion of radiation.  She is pleased that the right implant has continued to settle and she feels more symmetric at this time.  Recommend calling with questions or concerns. Follow up scheduled for 1 year for reevaluation.  Recommend calling with any changes and we can certainly reevaluate sooner.   Carola Rhine Jecenia Leamer, PA-C 10/06/2020, 9:40 AM

## 2020-10-13 ENCOUNTER — Inpatient Hospital Stay: Payer: No Typology Code available for payment source

## 2020-10-13 ENCOUNTER — Other Ambulatory Visit: Payer: Self-pay

## 2020-10-13 VITALS — BP 116/76 | HR 92 | Temp 98.6°F | Resp 18

## 2020-10-13 DIAGNOSIS — Z170 Estrogen receptor positive status [ER+]: Secondary | ICD-10-CM

## 2020-10-13 DIAGNOSIS — C50511 Malignant neoplasm of lower-outer quadrant of right female breast: Secondary | ICD-10-CM | POA: Diagnosis not present

## 2020-10-13 MED ORDER — GOSERELIN ACETATE 3.6 MG ~~LOC~~ IMPL
DRUG_IMPLANT | SUBCUTANEOUS | Status: AC
  Filled 2020-10-13: qty 3.6

## 2020-10-13 MED ORDER — GOSERELIN ACETATE 3.6 MG ~~LOC~~ IMPL
3.6000 mg | DRUG_IMPLANT | Freq: Once | SUBCUTANEOUS | Status: AC
  Administered 2020-10-13: 3.6 mg via SUBCUTANEOUS

## 2020-10-13 NOTE — Patient Instructions (Signed)

## 2020-10-19 ENCOUNTER — Encounter: Payer: Self-pay | Admitting: Oncology

## 2020-10-19 NOTE — Progress Notes (Signed)
  Patient Name: Christy Ferrell MRN: [id number] DOB: [birth year] Referring Physician: Lurline Del (Profile Not Attached) Date of Service: 08/25/2020 [facility], [place]                                                        End Of Treatment Note  Diagnoses: C50.511-Malignant neoplasm of lower-outer quadrant of right female breast  Cancer Staging:  pT3, pN1a, M0 Right Breast LIQ Invasive Ductal Carcinoma with DCIS, ER+ / PR+ / Her2-, Grade 2  Intent: Curative  Radiation Treatment Dates: 07/07/2020 through 08/25/2020 Site Technique Total Dose (Gy) Dose per Fx (Gy) Completed Fx Beam Energies  Chest Wall, Right: CW_Rt_IM_LN 3D 50.4/50.4 1.8 28/28 10X  Chest Wall, Right: CW_Rt_PAB_SCV 3D 50.4/50.4 1.8 28/28 6X, 15X  Chest Wall, Right: CW_Rt_Bst Electron 10/10 2 5/5 6E   Narrative: The patient tolerated radiation therapy relatively well.   Plan: The patient will follow-up with radiation oncology in [place] as needed.  -----------------------------------  SEppie Gibson MD

## 2020-10-20 LAB — COLOGUARD: Cologuard: NEGATIVE

## 2020-10-22 ENCOUNTER — Telehealth: Payer: Self-pay | Admitting: Oncology

## 2020-10-22 NOTE — Telephone Encounter (Signed)
Scheduled appointment per 06/09 sch msg. Patient is aware. 

## 2020-10-28 LAB — COLOGUARD: COLOGUARD: NEGATIVE

## 2020-11-06 ENCOUNTER — Other Ambulatory Visit: Payer: Self-pay

## 2020-11-10 ENCOUNTER — Ambulatory Visit: Payer: No Typology Code available for payment source

## 2020-11-11 ENCOUNTER — Inpatient Hospital Stay: Payer: No Typology Code available for payment source | Attending: Oncology

## 2020-11-11 ENCOUNTER — Other Ambulatory Visit: Payer: Self-pay

## 2020-11-11 VITALS — BP 142/84 | HR 71 | Temp 98.2°F | Resp 18

## 2020-11-11 DIAGNOSIS — R232 Flushing: Secondary | ICD-10-CM | POA: Insufficient documentation

## 2020-11-11 DIAGNOSIS — Z9011 Acquired absence of right breast and nipple: Secondary | ICD-10-CM | POA: Diagnosis not present

## 2020-11-11 DIAGNOSIS — Z923 Personal history of irradiation: Secondary | ICD-10-CM | POA: Insufficient documentation

## 2020-11-11 DIAGNOSIS — Z79899 Other long term (current) drug therapy: Secondary | ICD-10-CM | POA: Diagnosis not present

## 2020-11-11 DIAGNOSIS — C50511 Malignant neoplasm of lower-outer quadrant of right female breast: Secondary | ICD-10-CM | POA: Diagnosis present

## 2020-11-11 DIAGNOSIS — Z79810 Long term (current) use of selective estrogen receptor modulators (SERMs): Secondary | ICD-10-CM | POA: Diagnosis not present

## 2020-11-11 DIAGNOSIS — Z170 Estrogen receptor positive status [ER+]: Secondary | ICD-10-CM | POA: Insufficient documentation

## 2020-11-11 MED ORDER — GOSERELIN ACETATE 3.6 MG ~~LOC~~ IMPL
3.6000 mg | DRUG_IMPLANT | Freq: Once | SUBCUTANEOUS | Status: AC
  Administered 2020-11-11: 3.6 mg via SUBCUTANEOUS

## 2020-11-11 MED ORDER — GOSERELIN ACETATE 3.6 MG ~~LOC~~ IMPL
DRUG_IMPLANT | SUBCUTANEOUS | Status: AC
  Filled 2020-11-11: qty 3.6

## 2020-11-11 NOTE — Patient Instructions (Signed)
Goserelin injection What is this medication? GOSERELIN (GOE se rel in) is similar to a hormone found in the body. It lowers the amount of sex hormones that the body makes. Men will have lower testosterone levels and women will have lower estrogen levels while taking this medicine. In men, this medicine is used to treat prostate cancer; the injection is either given once per month or once every 12 weeks. A once per month injection (only) is used to treat women with endometriosis, dysfunctional uterine bleeding, or advanced breast cancer. This medicine may be used for other purposes; ask your health care provider or pharmacist if you have questions. COMMON BRAND NAME(S): Zoladex What should I tell my care team before I take this medication? They need to know if you have any of these conditions: bone problems diabetes heart disease history of irregular heartbeat an unusual or allergic reaction to goserelin, other medicines, foods, dyes, or preservatives pregnant or trying to get pregnant breast-feeding How should I use this medication? This medicine is for injection under the skin. It is given by a health care professional in a hospital or clinic setting. Talk to your pediatrician regarding the use of this medicine in children. Special care may be needed. Overdosage: If you think you have taken too much of this medicine contact a poison control center or emergency room at once. NOTE: This medicine is only for you. Do not share this medicine with others. What if I miss a dose? It is important not to miss your dose. Call your doctor or health care professional if you are unable to keep an appointment. What may interact with this medication? Do not take this medicine with any of the following medications: cisapride dronedarone pimozide thioridazine This medicine may also interact with the following medications: other medicines that prolong the QT interval (an abnormal heart rhythm) This list  may not describe all possible interactions. Give your health care provider a list of all the medicines, herbs, non-prescription drugs, or dietary supplements you use. Also tell them if you smoke, drink alcohol, or use illegal drugs. Some items may interact with your medicine. What should I watch for while using this medication? Visit your doctor or health care provider for regular checks on your progress. Your symptoms may appear to get worse during the first weeks of this therapy. Tell your doctor or healthcare provider if your symptoms do not start to get better or if they get worse after this time. Your bones may get weaker if you take this medicine for a long time. If you smoke or frequently drink alcohol you may increase your risk of bone loss. A family history of osteoporosis, chronic use of drugs for seizures (convulsions), or corticosteroids can also increase your risk of bone loss. Talk to your doctor about how to keep your bones strong. This medicine should stop regular monthly menstruation in women. Tell your doctor if you continue to menstruate. Women should not become pregnant while taking this medicine or for 12 weeks after stopping this medicine. Women should inform their doctor if they wish to become pregnant or think they might be pregnant. There is a potential for serious side effects to an unborn child. Talk to your health care professional or pharmacist for more information. Do not breast-feed an infant while taking this medicine. Men should inform their doctors if they wish to father a child. This medicine may lower sperm counts. Talk to your health care professional or pharmacist for more information. This medicine may   increase blood sugar. Ask your healthcare provider if changes in diet or medicines are needed if you have diabetes. What side effects may I notice from receiving this medication? Side effects that you should report to your doctor or health care professional as soon as  possible: allergic reactions like skin rash, itching or hives, swelling of the face, lips, or tongue bone pain breathing problems changes in vision chest pain feeling faint or lightheaded, falls fever, chills pain, swelling, warmth in the leg pain, tingling, numbness in the hands or feet signs and symptoms of high blood sugar such as being more thirsty or hungry or having to urinate more than normal. You may also feel very tired or have blurry vision signs and symptoms of low blood pressure like dizziness; feeling faint or lightheaded, falls; unusually weak or tired stomach pain swelling of the ankles, feet, hands trouble passing urine or change in the amount of urine unusually high or low blood pressure unusually weak or tired Side effects that usually do not require medical attention (report to your doctor or health care professional if they continue or are bothersome): change in sex drive or performance changes in breast size in both males and females changes in emotions or moods headache hot flashes irritation at site where injected loss of appetite skin problems like acne, dry skin vaginal dryness This list may not describe all possible side effects. Call your doctor for medical advice about side effects. You may report side effects to FDA at 1-800-FDA-1088. Where should I keep my medication? This drug is given in a hospital or clinic and will not be stored at home. NOTE: This sheet is a summary. It may not cover all possible information. If you have questions about this medicine, talk to your doctor, pharmacist, or health care provider.  2022 Elsevier/Gold Standard (2018-08-20 14:05:56)  

## 2020-12-05 ENCOUNTER — Other Ambulatory Visit: Payer: Self-pay | Admitting: Family Medicine

## 2020-12-08 ENCOUNTER — Inpatient Hospital Stay: Payer: No Typology Code available for payment source | Attending: Oncology

## 2020-12-08 ENCOUNTER — Other Ambulatory Visit: Payer: Self-pay

## 2020-12-08 VITALS — BP 140/83 | HR 88 | Temp 98.2°F | Resp 19

## 2020-12-08 DIAGNOSIS — Z79899 Other long term (current) drug therapy: Secondary | ICD-10-CM | POA: Diagnosis not present

## 2020-12-08 DIAGNOSIS — C50511 Malignant neoplasm of lower-outer quadrant of right female breast: Secondary | ICD-10-CM

## 2020-12-08 DIAGNOSIS — Z5111 Encounter for antineoplastic chemotherapy: Secondary | ICD-10-CM | POA: Insufficient documentation

## 2020-12-08 DIAGNOSIS — Z170 Estrogen receptor positive status [ER+]: Secondary | ICD-10-CM

## 2020-12-08 MED ORDER — GOSERELIN ACETATE 3.6 MG ~~LOC~~ IMPL
DRUG_IMPLANT | SUBCUTANEOUS | Status: AC
  Filled 2020-12-08: qty 3.6

## 2020-12-08 MED ORDER — GOSERELIN ACETATE 3.6 MG ~~LOC~~ IMPL
3.6000 mg | DRUG_IMPLANT | Freq: Once | SUBCUTANEOUS | Status: AC
  Administered 2020-12-08: 3.6 mg via SUBCUTANEOUS

## 2020-12-08 NOTE — Patient Instructions (Signed)
Goserelin injection What is this medication? GOSERELIN (GOE se rel in) is similar to a hormone found in the body. It lowers the amount of sex hormones that the body makes. Men will have lower testosterone levels and women will have lower estrogen levels while taking this medicine. In men, this medicine is used to treat prostate cancer; the injection is either given once per month or once every 12 weeks. A once per month injection (only) is used to treat women with endometriosis, dysfunctional uterine bleeding, or advanced breast cancer. This medicine may be used for other purposes; ask your health care provider or pharmacist if you have questions. COMMON BRAND NAME(S): Zoladex What should I tell my care team before I take this medication? They need to know if you have any of these conditions: bone problems diabetes heart disease history of irregular heartbeat an unusual or allergic reaction to goserelin, other medicines, foods, dyes, or preservatives pregnant or trying to get pregnant breast-feeding How should I use this medication? This medicine is for injection under the skin. It is given by a health care professional in a hospital or clinic setting. Talk to your pediatrician regarding the use of this medicine in children. Special care may be needed. Overdosage: If you think you have taken too much of this medicine contact a poison control center or emergency room at once. NOTE: This medicine is only for you. Do not share this medicine with others. What if I miss a dose? It is important not to miss your dose. Call your doctor or health care professional if you are unable to keep an appointment. What may interact with this medication? Do not take this medicine with any of the following medications: cisapride dronedarone pimozide thioridazine This medicine may also interact with the following medications: other medicines that prolong the QT interval (an abnormal heart rhythm) This list  may not describe all possible interactions. Give your health care provider a list of all the medicines, herbs, non-prescription drugs, or dietary supplements you use. Also tell them if you smoke, drink alcohol, or use illegal drugs. Some items may interact with your medicine. What should I watch for while using this medication? Visit your doctor or health care provider for regular checks on your progress. Your symptoms may appear to get worse during the first weeks of this therapy. Tell your doctor or healthcare provider if your symptoms do not start to get better or if they get worse after this time. Your bones may get weaker if you take this medicine for a long time. If you smoke or frequently drink alcohol you may increase your risk of bone loss. A family history of osteoporosis, chronic use of drugs for seizures (convulsions), or corticosteroids can also increase your risk of bone loss. Talk to your doctor about how to keep your bones strong. This medicine should stop regular monthly menstruation in women. Tell your doctor if you continue to menstruate. Women should not become pregnant while taking this medicine or for 12 weeks after stopping this medicine. Women should inform their doctor if they wish to become pregnant or think they might be pregnant. There is a potential for serious side effects to an unborn child. Talk to your health care professional or pharmacist for more information. Do not breast-feed an infant while taking this medicine. Men should inform their doctors if they wish to father a child. This medicine may lower sperm counts. Talk to your health care professional or pharmacist for more information. This medicine may   increase blood sugar. Ask your healthcare provider if changes in diet or medicines are needed if you have diabetes. What side effects may I notice from receiving this medication? Side effects that you should report to your doctor or health care professional as soon as  possible: allergic reactions like skin rash, itching or hives, swelling of the face, lips, or tongue bone pain breathing problems changes in vision chest pain feeling faint or lightheaded, falls fever, chills pain, swelling, warmth in the leg pain, tingling, numbness in the hands or feet signs and symptoms of high blood sugar such as being more thirsty or hungry or having to urinate more than normal. You may also feel very tired or have blurry vision signs and symptoms of low blood pressure like dizziness; feeling faint or lightheaded, falls; unusually weak or tired stomach pain swelling of the ankles, feet, hands trouble passing urine or change in the amount of urine unusually high or low blood pressure unusually weak or tired Side effects that usually do not require medical attention (report to your doctor or health care professional if they continue or are bothersome): change in sex drive or performance changes in breast size in both males and females changes in emotions or moods headache hot flashes irritation at site where injected loss of appetite skin problems like acne, dry skin vaginal dryness This list may not describe all possible side effects. Call your doctor for medical advice about side effects. You may report side effects to FDA at 1-800-FDA-1088. Where should I keep my medication? This drug is given in a hospital or clinic and will not be stored at home. NOTE: This sheet is a summary. It may not cover all possible information. If you have questions about this medicine, talk to your doctor, pharmacist, or health care provider.  2022 Elsevier/Gold Standard (2018-08-20 14:05:56)  

## 2020-12-15 ENCOUNTER — Other Ambulatory Visit: Payer: Self-pay

## 2020-12-15 ENCOUNTER — Ambulatory Visit: Payer: No Typology Code available for payment source | Attending: General Surgery

## 2020-12-15 DIAGNOSIS — Z483 Aftercare following surgery for neoplasm: Secondary | ICD-10-CM | POA: Insufficient documentation

## 2020-12-15 DIAGNOSIS — R600 Localized edema: Secondary | ICD-10-CM | POA: Diagnosis present

## 2020-12-15 DIAGNOSIS — R293 Abnormal posture: Secondary | ICD-10-CM | POA: Diagnosis present

## 2020-12-15 DIAGNOSIS — M25611 Stiffness of right shoulder, not elsewhere classified: Secondary | ICD-10-CM | POA: Insufficient documentation

## 2020-12-15 DIAGNOSIS — D4861 Neoplasm of uncertain behavior of right breast: Secondary | ICD-10-CM | POA: Diagnosis present

## 2020-12-15 NOTE — Therapy (Signed)
[place], [place], [address] Phone: [phone]   Fax:  [phone]  Physical Therapy Treatment  Patient Details  Name: Christy Ferrell MRN: [id number] Date of Birth: 1971/03/28 Referring Provider (PT): Dr. Marlou Starks   Encounter Date: 12/15/2020   PT End of Session - 12/15/20 0852     Visit Number 15    Number of Visits 15    Date for PT Re-Evaluation 12/15/20    PT Start Time 0815    PT Stop Time 0852    PT Time Calculation (min) 37 min    Activity Tolerance Patient tolerated treatment well    Behavior During Therapy [facility] for tasks assessed/performed             Past Medical History:  Diagnosis Date   Anxiety    h/o, was prev on zoloft   Breast cancer ([place]) 01/2020   seeing Dr. Marlou Starks   Cholelithiasis    Diabetes mellitus without complication [facility])    Family history of breast cancer    Family history of leukemia    Family history of lung cancer    Family history of prostate cancer    Family history of stomach cancer    Hypothyroidism    postpartum hypothyroidism   IUD (intrauterine device) in place    mirena per [facility]   Major depression     Past Surgical History:  Procedure Laterality Date   BREAST RECONSTRUCTION WITH PLACEMENT OF TISSUE EXPANDER AND FLEX HD (ACELLULAR HYDRATED DERMIS) Right 03/30/2020   Procedure: BREAST RECONSTRUCTION WITH PLACEMENT OF TISSUE EXPANDER AND FLEX HD (ACELLULAR HYDRATED DERMIS);  Surgeon: Wallace Going, DO;  Location: [place];  Service: Plastics;  Laterality: Right;   BREAST REDUCTION WITH MASTOPEXY Left 05/28/2020   Procedure: BREAST REDUCTION WITH MASTOPEXY;  Surgeon: Wallace Going, DO;  Location: [place];  Service: Plastics;  Laterality: Left;   CESAREAN SECTION     X 2 Fetal stress, second scheduled   CHOLECYSTECTOMY  05/1998   MASTECTOMY MODIFIED RADICAL Right 03/30/2020   Procedure: RIGHT  MASTECTOMY MODIFIED RADICAL;  Surgeon: Jovita Kussmaul, MD;  Location: [place];  Service: General;  Laterality: Right;  PEC BLOCK, RNFA   REMOVAL OF TISSUE EXPANDER AND PLACEMENT OF IMPLANT Right 05/28/2020   Procedure: REMOVAL OF TISSUE EXPANDER AND PLACEMENT OF IMPLANT;  Surgeon: Wallace Going, DO;  Location: [place];  Service: Plastics;  Laterality: Right;  2.5 hours, please    There were no vitals filed for this visit.   Subjective Assessment - 12/15/20 0816     Subjective Finished radiation August 25, 2020, and then someone quit at work so I haven't been able to come in. I blistered pretty badly across the scar. Reaching back is still hard and when I raise my arm it it is tight at the mid arm to the armpit. Kept up with the exercises until May.    Pertinent History Rt invasive breast cancer lower inner quadrant and DCIS/ 03/30/20- ER/PR +, HER 2-, ALND (2 of 9 positive), pt will require radiation and no chemo    Currently in Pain? No/denies   occasional pain in right breast and arm but not daily.   Pain Score 0-No pain    Multiple Pain Sites No                [facility] PT Assessment - 12/15/20 0001  Assessment   Medical Diagnosis Rt breast cancer    Onset Date/Surgical Date 03/30/20    Hand Dominance Right      Prior Function   Level of Independence Independent      Observation/Other Assessments   Observations incisions well jhealed. Implant looks slightly high but no visible swelling. mild cording felt in axilla, but not limiting ROM      AROM   Right Shoulder Extension 64 Degrees    Right Shoulder Flexion 170 Degrees    Right Shoulder ABduction 177 Degrees    Right Shoulder Internal Rotation 69 Degrees    Right Shoulder External Rotation 98 Degrees               LYMPHEDEMA/ONCOLOGY QUESTIONNAIRE - 12/15/20 0001       Right Upper Extremity Lymphedema   10 cm Proximal to Olecranon Process 30.8 cm    Olecranon Process  25.8 cm    10 cm Proximal to Ulnar Styloid Process 22.9 cm    Just Proximal to Ulnar Styloid Process 16 cm    Across Hand at [employer] 19 cm    At [place] of 2nd Digit 6 cm                        [facility] Adult PT Treatment/Exercise - 12/15/20 0001       Shoulder Exercises: Supine   Other Supine Exercises Supine wand flexion and scaption x 5                         PT Long Term Goals - 12/15/20 7371       PT LONG TERM GOAL #1   Title Pt will demonstrate 170 degrees of right shoulder flexion to allow her to reach overhead.    Time 4    Period Weeks    Status Achieved      PT LONG TERM GOAL #2   Title Pt will demonstrate 170 degrees of right shoulder abduction to allow pt to reach out to the side.    Time 4    Period Weeks    Status Achieved      PT LONG TERM GOAL #3   Title Pt will demonstrate a 75% reduction in swelling inferior to expander to decrease risk of cellulitis.    Period Weeks    Status Achieved      PT LONG TERM GOAL #4   Title Pt will report no cording in RUE to allow pt to reach overhead without discomfort.    Baseline mild cording present but not limiting    Period Weeks    Status Achieved      PT LONG TERM GOAL #5   Title Pt will be independent in a home exercise program for continued strengthening and stretching.    Baseline knows exercises but hasn't done recently; will start back    Time 4    Period Weeks    Status Achieved                   Plan - 12/15/20 0626     Clinical Impression Statement Pt has completed radiation and returned to work.  She came in today for reassessment.  She has achieved all of her goals.  There is no visible breast edema or measurable difference in arm circumference.  She has restored her ROM, but does have mild end range tightness that improved after we did some  stretches.  She has mild axillary cording that is palpable but not visible that does not interfere with ROM.  There are  no further needs identified at this time.  She will work on chest stretches and AAROM at home. She knows to call should she have any questions or concerns at a later date.    Stability/Clinical Decision Making Stable/Uncomplicated    Rehab Potential Excellent    PT Frequency 2x / week    PT Duration 4 weeks    PT Treatment/Interventions ADLs/Self Care Home Management;Therapeutic exercise;Patient/family education;Manual techniques;Manual lymph drainage;Compression bandaging;Orthotic Fit/Training;Scar mobilization;Passive range of motion;Taping;Vasopneumatic Device    PT Next Visit Plan Discharged to HEP    PT Home Exercise Plan AAROM exs, pec stretch, wall slides    Consulted and Agree with Plan of Care Patient             Patient will benefit from skilled therapeutic intervention in order to improve the following deficits and impairments:  Pain, Postural dysfunction, Impaired UE functional use, Increased fascial restricitons, Decreased range of motion, Decreased scar mobility, Increased edema, Decreased knowledge of precautions  Visit Diagnosis: Stiffness of right shoulder, not elsewhere classified  Localized edema  Aftercare following surgery for neoplasm  Abnormal posture  Neoplasm of uncertain behavior of lower inner quadrant of female breast, right     Problem List Patient Active Problem List   Diagnosis Date Noted   Acute non-recurrent maxillary sinusitis 09/22/2020   Acquired absence of right breast 09/01/2020   S/P breast reconstruction, right 07/05/2020   H/O mastopexy 07/05/2020   Genetic testing 06/23/2020   Family history of prostate cancer    Family history of breast cancer    Family history of leukemia    Family history of lung cancer    Family history of stomach cancer    Hepatic steatosis 02/13/2020   Malignant neoplasm of lower-outer quadrant of right breast of female, estrogen receptor positive ([place]) 01/29/2020   Diabetes mellitus without complication  ([place]) 09/98/3382   Abnormal thyroid stimulating hormone (TSH) level 09/14/2016   Hyperglycemia 09/14/2016   Fever blister 09/14/2016   Advance care planning 09/14/2016   Cough 10/06/2014   Headache(784.0) 06/05/2012   Shoulder pain 01/18/2012   Anxiety 02/18/2011   Routine general medical examination at a health care facility 09/03/2010   Hypothyroidism 12/14/2006  PHYSICAL THERAPY DISCHARGE SUMMARY  Visits from Start of Care: 15  Current functional level related to goals / functional outcomes: Achieved all goals   Remaining deficits: Mild end range tightness;pt will resume her stretches discussed today   Education / Equipment: NA   Patient agrees to discharge. Patient goals were met. Patient is being discharged due to meeting the stated rehab goals.  Claris Pong 12/15/2020, 10:18 AM  [place], [place], [address] Phone: [phone]   Fax:  [phone]  Name: DAISY LITES MRN: [id number] Date of Birth: 1971/02/06  Cheral Almas, PT 12/15/20 10:21 AM

## 2020-12-30 ENCOUNTER — Other Ambulatory Visit: Payer: Self-pay | Admitting: Family Medicine

## 2020-12-30 DIAGNOSIS — E119 Type 2 diabetes mellitus without complications: Secondary | ICD-10-CM

## 2021-01-04 ENCOUNTER — Other Ambulatory Visit: Payer: No Typology Code available for payment source

## 2021-01-05 ENCOUNTER — Other Ambulatory Visit: Payer: Self-pay

## 2021-01-05 ENCOUNTER — Inpatient Hospital Stay: Payer: No Typology Code available for payment source | Attending: Oncology

## 2021-01-05 VITALS — BP 143/68 | HR 88 | Temp 98.4°F | Resp 18

## 2021-01-05 DIAGNOSIS — C50511 Malignant neoplasm of lower-outer quadrant of right female breast: Secondary | ICD-10-CM | POA: Insufficient documentation

## 2021-01-05 DIAGNOSIS — Z5111 Encounter for antineoplastic chemotherapy: Secondary | ICD-10-CM | POA: Diagnosis not present

## 2021-01-05 DIAGNOSIS — Z79899 Other long term (current) drug therapy: Secondary | ICD-10-CM | POA: Insufficient documentation

## 2021-01-05 DIAGNOSIS — Z170 Estrogen receptor positive status [ER+]: Secondary | ICD-10-CM | POA: Diagnosis not present

## 2021-01-05 MED ORDER — GOSERELIN ACETATE 3.6 MG ~~LOC~~ IMPL
3.6000 mg | DRUG_IMPLANT | Freq: Once | SUBCUTANEOUS | Status: AC
  Administered 2021-01-05: 3.6 mg via SUBCUTANEOUS
  Filled 2021-01-05: qty 3.6

## 2021-01-05 NOTE — Patient Instructions (Signed)
Goserelin injection What is this medication? GOSERELIN (GOE se rel in) is similar to a hormone found in the body. It lowers the amount of sex hormones that the body makes. Men will have lower testosterone levels and women will have lower estrogen levels while taking this medicine. In men, this medicine is used to treat prostate cancer; the injection is either given once per month or once every 12 weeks. A once per month injection (only) is used to treat women with endometriosis, dysfunctional uterine bleeding, or advanced breast cancer. This medicine may be used for other purposes; ask your health care provider or pharmacist if you have questions. COMMON BRAND NAME(S): Zoladex What should I tell my care team before I take this medication? They need to know if you have any of these conditions: bone problems diabetes heart disease history of irregular heartbeat an unusual or allergic reaction to goserelin, other medicines, foods, dyes, or preservatives pregnant or trying to get pregnant breast-feeding How should I use this medication? This medicine is for injection under the skin. It is given by a health care professional in a hospital or clinic setting. Talk to your pediatrician regarding the use of this medicine in children. Special care may be needed. Overdosage: If you think you have taken too much of this medicine contact a poison control center or emergency room at once. NOTE: This medicine is only for you. Do not share this medicine with others. What if I miss a dose? It is important not to miss your dose. Call your doctor or health care professional if you are unable to keep an appointment. What may interact with this medication? Do not take this medicine with any of the following medications: cisapride dronedarone pimozide thioridazine This medicine may also interact with the following medications: other medicines that prolong the QT interval (an abnormal heart rhythm) This list  may not describe all possible interactions. Give your health care provider a list of all the medicines, herbs, non-prescription drugs, or dietary supplements you use. Also tell them if you smoke, drink alcohol, or use illegal drugs. Some items may interact with your medicine. What should I watch for while using this medication? Visit your doctor or health care provider for regular checks on your progress. Your symptoms may appear to get worse during the first weeks of this therapy. Tell your doctor or healthcare provider if your symptoms do not start to get better or if they get worse after this time. Your bones may get weaker if you take this medicine for a long time. If you smoke or frequently drink alcohol you may increase your risk of bone loss. A family history of osteoporosis, chronic use of drugs for seizures (convulsions), or corticosteroids can also increase your risk of bone loss. Talk to your doctor about how to keep your bones strong. This medicine should stop regular monthly menstruation in women. Tell your doctor if you continue to menstruate. Women should not become pregnant while taking this medicine or for 12 weeks after stopping this medicine. Women should inform their doctor if they wish to become pregnant or think they might be pregnant. There is a potential for serious side effects to an unborn child. Talk to your health care professional or pharmacist for more information. Do not breast-feed an infant while taking this medicine. Men should inform their doctors if they wish to father a child. This medicine may lower sperm counts. Talk to your health care professional or pharmacist for more information. This medicine may   increase blood sugar. Ask your healthcare provider if changes in diet or medicines are needed if you have diabetes. What side effects may I notice from receiving this medication? Side effects that you should report to your doctor or health care professional as soon as  possible: allergic reactions like skin rash, itching or hives, swelling of the face, lips, or tongue bone pain breathing problems changes in vision chest pain feeling faint or lightheaded, falls fever, chills pain, swelling, warmth in the leg pain, tingling, numbness in the hands or feet signs and symptoms of high blood sugar such as being more thirsty or hungry or having to urinate more than normal. You may also feel very tired or have blurry vision signs and symptoms of low blood pressure like dizziness; feeling faint or lightheaded, falls; unusually weak or tired stomach pain swelling of the ankles, feet, hands trouble passing urine or change in the amount of urine unusually high or low blood pressure unusually weak or tired Side effects that usually do not require medical attention (report to your doctor or health care professional if they continue or are bothersome): change in sex drive or performance changes in breast size in both males and females changes in emotions or moods headache hot flashes irritation at site where injected loss of appetite skin problems like acne, dry skin vaginal dryness This list may not describe all possible side effects. Call your doctor for medical advice about side effects. You may report side effects to FDA at 1-800-FDA-1088. Where should I keep my medication? This drug is given in a hospital or clinic and will not be stored at home. NOTE: This sheet is a summary. It may not cover all possible information. If you have questions about this medicine, talk to your doctor, pharmacist, or health care provider.  2022 Elsevier/Gold Standard (2018-08-20 14:05:56)  

## 2021-01-11 ENCOUNTER — Encounter: Payer: No Typology Code available for payment source | Admitting: Family Medicine

## 2021-01-13 ENCOUNTER — Ambulatory Visit: Payer: No Typology Code available for payment source | Admitting: Obstetrics and Gynecology

## 2021-01-14 ENCOUNTER — Ambulatory Visit (INDEPENDENT_AMBULATORY_CARE_PROVIDER_SITE_OTHER): Payer: No Typology Code available for payment source | Admitting: Obstetrics and Gynecology

## 2021-01-14 ENCOUNTER — Encounter: Payer: Self-pay | Admitting: Obstetrics and Gynecology

## 2021-01-14 ENCOUNTER — Other Ambulatory Visit: Payer: Self-pay

## 2021-01-14 VITALS — BP 120/70 | Ht 63.0 in | Wt 180.0 lb

## 2021-01-14 DIAGNOSIS — B373 Candidiasis of vulva and vagina: Secondary | ICD-10-CM

## 2021-01-14 DIAGNOSIS — B3731 Acute candidiasis of vulva and vagina: Secondary | ICD-10-CM

## 2021-01-14 LAB — POCT WET PREP WITH KOH
Clue Cells Wet Prep HPF POC: NEGATIVE
KOH Prep POC: NEGATIVE
Trichomonas, UA: NEGATIVE
Yeast Wet Prep HPF POC: POSITIVE

## 2021-01-14 MED ORDER — FLUCONAZOLE 150 MG PO TABS: 150.0000 mg | ORAL_TABLET | Freq: Once | ORAL | 0 refills | Status: AC

## 2021-01-14 MED ORDER — CLOTRIMAZOLE-BETAMETHASONE 1-0.05 % EX CREA
TOPICAL_CREAM | CUTANEOUS | 0 refills | Status: DC
Start: 2021-01-14 — End: 2021-07-29

## 2021-01-14 NOTE — Progress Notes (Signed)
[name], MD   Chief Complaint  Patient presents with   Vaginal Itching    Irritation, no discharge or odor x couple of months    HPI:      [name] is a 50 y.o. [phone] whose LMP was No LMP recorded. (Menstrual status: Other)., presents today for vaginal and perineal itching for several months, no increase d/c or odor. Treated for yeast vag 4/22 with pos wet prep and exam. Sx didn't resolve. Has been treating with vagisil crm and OTC hydrocortisone crm with some itch relief. Has scratched area till bleeding, but is trying to scratch. No urin sx. Uses water to wash and dryer sheets, no thongs, wipes.  She is doing breast cancer tx with tamoxifen and zoladex injections. Feels like sx increase after zoladex.    Past Medical History:  Diagnosis Date   Anxiety    h/o, was prev on zoloft   Breast cancer ([place]) 01/2020   seeing Dr. Marlou Starks   Cholelithiasis    Diabetes mellitus without complication [facility])    Family history of breast cancer    Family history of leukemia    Family history of lung cancer    Family history of prostate cancer    Family history of stomach cancer    Hypothyroidism    postpartum hypothyroidism   IUD (intrauterine device) in place    mirena per [facility]   Major depression     Past Surgical History:  Procedure Laterality Date   BREAST RECONSTRUCTION WITH PLACEMENT OF TISSUE EXPANDER AND FLEX HD (ACELLULAR HYDRATED DERMIS) Right 03/30/2020   Procedure: BREAST RECONSTRUCTION WITH PLACEMENT OF TISSUE EXPANDER AND FLEX HD (ACELLULAR HYDRATED DERMIS);  Surgeon: Wallace Going, DO;  Location: [place];  Service: Plastics;  Laterality: Right;   BREAST REDUCTION WITH MASTOPEXY Left 05/28/2020   Procedure: BREAST REDUCTION WITH MASTOPEXY;  Surgeon: Wallace Going, DO;  Location: [place];  Service: Plastics;  Laterality: Left;   CESAREAN SECTION     X 2 Fetal stress, second scheduled    CHOLECYSTECTOMY  05/1998   MASTECTOMY MODIFIED RADICAL Right 03/30/2020   Procedure: RIGHT MASTECTOMY MODIFIED RADICAL;  Surgeon: Jovita Kussmaul, MD;  Location: [place];  Service: General;  Laterality: Right;  PEC BLOCK, RNFA   REMOVAL OF TISSUE EXPANDER AND PLACEMENT OF IMPLANT Right 05/28/2020   Procedure: REMOVAL OF TISSUE EXPANDER AND PLACEMENT OF IMPLANT;  Surgeon: Wallace Going, DO;  Location: [place];  Service: Plastics;  Laterality: Right;  2.5 hours, please    Family History  Problem Relation Age of Onset   Hypertension Father    Depression Father    Prostate cancer Father 70       Prostate, S/P prostatectomy, metastatic   Asthma Sister    Spina bifida Sister    Hypothyroidism Mother    Hypertension Maternal Grandmother    Hyperlipidemia Maternal Grandmother    Lung disease Maternal Grandmother    Lung cancer Maternal Grandmother 61   Hypothyroidism Maternal Grandfather    Prostate cancer Maternal Grandfather    Cancer Maternal Grandfather        lung, stomach, and prostate cancers   Emphysema Paternal Grandmother    Diabetes Maternal Uncle    Diabetes Maternal Uncle    Leukemia Maternal Uncle 70   Prostate cancer Maternal Uncle        dx in his 75s/70s, metastatic   Breast cancer  Other 62       negative genetic testing; maternal first cousin   Stroke Neg Hx    Colon cancer Neg Hx     Social History   Socioeconomic History   Marital status: Divorced    Spouse name: Not on file   Number of children: Not on file   Years of education: Not on file   Highest education level: Not on file  Occupational History   Occupation: Housewife  Tobacco Use   Smoking status: Never   Smokeless tobacco: Never  Vaping Use   Vaping Use: Never used  Substance and Sexual Activity   Alcohol use: Yes    Alcohol/week: 0.0 standard drinks    Comment: Very rarely.   Drug use: No   Sexual activity: Yes    Birth control/protection: None   Other Topics Concern   Not on file  Social History Narrative   Married 1997, divorced 2017   In relationship as of 2021   2 kids   Social Determinants of Radio broadcast assistant Strain: Not on file  Food Insecurity: Not on file  Transportation Needs: Not on file  Physical Activity: Not on file  Stress: Not on file  Social Connections: Not on file  Intimate Partner Violence: Not on file    Outpatient Medications Prior to Visit  Medication Sig Dispense Refill   acyclovir ointment (ZOVIRAX) 5 % Apply 1 application topically every 3 (three) hours. As needed. 5 g 5   cholecalciferol (VITAMIN D3) 25 MCG (1000 UNIT) tablet Take 1,000 Units by mouth daily.     cyclobenzaprine (FLEXERIL) 10 MG tablet TAKE 1/2 TO 1 TABLET BY MOUTH EVERY DAY AS NEEDED 30 tablet 5   escitalopram (LEXAPRO) 10 MG tablet Take 1 tablet (10 mg total) by mouth daily.     goserelin (ZOLADEX) 3.6 MG injection Inject 3.6 mg into the skin every 28 (twenty-eight) days.     ibuprofen (ADVIL,MOTRIN) 200 MG tablet Take 200 mg by mouth every 6 (six) hours as needed.     levothyroxine (SYNTHROID) 25 MCG tablet TAKE 1 TABLET BY MOUTH DAILY BEFORE BREAKFAST. 30 tablet 2   tamoxifen (NOLVADEX) 20 MG tablet Take 1 tablet (20 mg total) by mouth daily. 90 tablet 4   Turmeric (QC TUMERIC COMPLEX PO) Take by mouth.     valACYclovir (VALTREX) 1000 MG tablet Take 2 tablets (2,000 mg total) by mouth 2 (two) times daily as needed. For 1 day. 20 tablet 5   azithromycin (ZITHROMAX) 250 MG tablet Take 2 tablets by mouth today, then 1 tablet daily for 4 additional days. 6 tablet 0   gabapentin (NEURONTIN) 300 MG capsule Take 1 capsule (300 mg total) by mouth at bedtime. 90 capsule 4   No facility-administered medications prior to visit.      ROS:  Review of Systems  Constitutional:  Negative for fever.  Gastrointestinal:  Negative for blood in stool, constipation, diarrhea, nausea and vomiting.  Genitourinary:  Positive for  vaginal pain. Negative for dyspareunia, dysuria, flank pain, frequency, hematuria, urgency, vaginal bleeding and vaginal discharge.  Musculoskeletal:  Negative for back pain.  Skin:  Negative for rash.  BREAST: No symptoms   OBJECTIVE:   Vitals:  BP 120/70   Ht '5\' 3"'$  (1.6 m)   Wt 180 lb (81.6 kg)   BMI 31.89 kg/m   Physical Exam Vitals reviewed.  Constitutional:      Appearance: She is well-developed.  Pulmonary:     Effort: Pulmonary  effort is normal.  Genitourinary:    General: Normal vulva.     Pubic Area: No rash.      Labia:        Right: Rash and tenderness present. No lesion.        Left: Rash and tenderness present. No lesion.      Vagina: Vaginal discharge present. No erythema or tenderness.     Cervix: Normal.     Uterus: Normal. Not enlarged and not tender.      Adnexa: Right adnexa normal and left adnexa normal.       Right: No mass or tenderness.         Left: No mass or tenderness.      Musculoskeletal:        General: Normal range of motion.     Cervical back: Normal range of motion.  Skin:    General: Skin is warm and dry.  Neurological:     General: No focal deficit present.     Mental Status: She is alert and oriented to person, place, and time.  Psychiatric:        Mood and Affect: Mood normal.        Behavior: Behavior normal.        Thought Content: Thought content normal.        Judgment: Judgment normal.    Results: Results for orders placed or performed in visit on 01/14/21 (from the past 24 hour(s))  POCT Wet Prep with KOH     Status: Abnormal   Collection Time: 01/14/21 11:52 AM  Result Value Ref Range   Trichomonas, UA Negative    Clue Cells Wet Prep HPF POC neg    Epithelial Wet Prep HPF POC     Yeast Wet Prep HPF POC pos    Bacteria Wet Prep HPF POC     RBC Wet Prep HPF POC     WBC Wet Prep HPF POC     KOH Prep POC Negative Negative     Assessment/Plan: Candidal vaginitis - Plan: fluconazole (DIFLUCAN) 150 MG tablet,  clotrimazole-betamethasone (LOTRISONE) cream, POCT Wet Prep with KOH; pos sx and wet prep. Rx diflucan and lotrisone crm. F/u if sx persist. May need wkly diflucan as preventive prn. D/C dryer sheets.    Meds ordered this encounter  Medications   fluconazole (DIFLUCAN) 150 MG tablet    Sig: Take 1 tablet (150 mg total) by mouth once for 1 dose. May repeat in 3 days if still having symptoms    Dispense:  2 tablet    Refill:  0    Order Specific Question:   Supervising Provider    Answer:   Gae Dry [id number]   clotrimazole-betamethasone (LOTRISONE) cream    Sig: Apply externally BID prn sx up to 2 wks    Dispense:  15 g    Refill:  0    Order Specific Question:   Supervising Provider    Answer:   Gae Dry [id number]       Return if symptoms worsen or fail to improve.  Braxxton Stoudt B. Zivah Mayr, PA-C 01/14/2021 11:53 AM

## 2021-01-28 ENCOUNTER — Telehealth: Payer: Self-pay | Admitting: Plastic Surgery

## 2021-01-28 NOTE — Telephone Encounter (Signed)
Patient is inquiring about guidance for aftercare instructions. Please call to advise [phone].  Thank you.

## 2021-01-29 NOTE — Telephone Encounter (Signed)
Called patient. LMVM to call back.

## 2021-02-02 ENCOUNTER — Inpatient Hospital Stay: Payer: No Typology Code available for payment source | Attending: Oncology

## 2021-02-02 ENCOUNTER — Ambulatory Visit: Payer: No Typology Code available for payment source | Admitting: Plastic Surgery

## 2021-02-02 ENCOUNTER — Other Ambulatory Visit: Payer: Self-pay

## 2021-02-02 VITALS — BP 154/88 | HR 87 | Temp 98.0°F | Resp 18

## 2021-02-02 DIAGNOSIS — Z79899 Other long term (current) drug therapy: Secondary | ICD-10-CM | POA: Diagnosis not present

## 2021-02-02 DIAGNOSIS — Z170 Estrogen receptor positive status [ER+]: Secondary | ICD-10-CM | POA: Insufficient documentation

## 2021-02-02 DIAGNOSIS — Z79810 Long term (current) use of selective estrogen receptor modulators (SERMs): Secondary | ICD-10-CM | POA: Insufficient documentation

## 2021-02-02 DIAGNOSIS — Z9011 Acquired absence of right breast and nipple: Secondary | ICD-10-CM | POA: Diagnosis not present

## 2021-02-02 DIAGNOSIS — C50511 Malignant neoplasm of lower-outer quadrant of right female breast: Secondary | ICD-10-CM

## 2021-02-02 DIAGNOSIS — Z923 Personal history of irradiation: Secondary | ICD-10-CM | POA: Insufficient documentation

## 2021-02-02 MED ORDER — GOSERELIN ACETATE 3.6 MG ~~LOC~~ IMPL
3.6000 mg | DRUG_IMPLANT | Freq: Once | SUBCUTANEOUS | Status: AC
  Administered 2021-02-02: 3.6 mg via SUBCUTANEOUS
  Filled 2021-02-02: qty 3.6

## 2021-02-02 NOTE — Patient Instructions (Signed)
Goserelin injection What is this medication? GOSERELIN (GOE se rel in) is similar to a hormone found in the body. It lowers the amount of sex hormones that the body makes. Men will have lower testosterone levels and women will have lower estrogen levels while taking this medicine. In men, this medicine is used to treat prostate cancer; the injection is either given once per month or once every 12 weeks. A once per month injection (only) is used to treat women with endometriosis, dysfunctional uterine bleeding, or advanced breast cancer. This medicine may be used for other purposes; ask your health care provider or pharmacist if you have questions. COMMON BRAND NAME(S): Zoladex What should I tell my care team before I take this medication? They need to know if you have any of these conditions: bone problems diabetes heart disease history of irregular heartbeat an unusual or allergic reaction to goserelin, other medicines, foods, dyes, or preservatives pregnant or trying to get pregnant breast-feeding How should I use this medication? This medicine is for injection under the skin. It is given by a health care professional in a hospital or clinic setting. Talk to your pediatrician regarding the use of this medicine in children. Special care may be needed. Overdosage: If you think you have taken too much of this medicine contact a poison control center or emergency room at once. NOTE: This medicine is only for you. Do not share this medicine with others. What if I miss a dose? It is important not to miss your dose. Call your doctor or health care professional if you are unable to keep an appointment. What may interact with this medication? Do not take this medicine with any of the following medications: cisapride dronedarone pimozide thioridazine This medicine may also interact with the following medications: other medicines that prolong the QT interval (an abnormal heart rhythm) This list  may not describe all possible interactions. Give your health care provider a list of all the medicines, herbs, non-prescription drugs, or dietary supplements you use. Also tell them if you smoke, drink alcohol, or use illegal drugs. Some items may interact with your medicine. What should I watch for while using this medication? Visit your doctor or health care provider for regular checks on your progress. Your symptoms may appear to get worse during the first weeks of this therapy. Tell your doctor or healthcare provider if your symptoms do not start to get better or if they get worse after this time. Your bones may get weaker if you take this medicine for a long time. If you smoke or frequently drink alcohol you may increase your risk of bone loss. A family history of osteoporosis, chronic use of drugs for seizures (convulsions), or corticosteroids can also increase your risk of bone loss. Talk to your doctor about how to keep your bones strong. This medicine should stop regular monthly menstruation in women. Tell your doctor if you continue to menstruate. Women should not become pregnant while taking this medicine or for 12 weeks after stopping this medicine. Women should inform their doctor if they wish to become pregnant or think they might be pregnant. There is a potential for serious side effects to an unborn child. Talk to your health care professional or pharmacist for more information. Do not breast-feed an infant while taking this medicine. Men should inform their doctors if they wish to father a child. This medicine may lower sperm counts. Talk to your health care professional or pharmacist for more information. This medicine may   increase blood sugar. Ask your healthcare provider if changes in diet or medicines are needed if you have diabetes. What side effects may I notice from receiving this medication? Side effects that you should report to your doctor or health care professional as soon as  possible: allergic reactions like skin rash, itching or hives, swelling of the face, lips, or tongue bone pain breathing problems changes in vision chest pain feeling faint or lightheaded, falls fever, chills pain, swelling, warmth in the leg pain, tingling, numbness in the hands or feet signs and symptoms of high blood sugar such as being more thirsty or hungry or having to urinate more than normal. You may also feel very tired or have blurry vision signs and symptoms of low blood pressure like dizziness; feeling faint or lightheaded, falls; unusually weak or tired stomach pain swelling of the ankles, feet, hands trouble passing urine or change in the amount of urine unusually high or low blood pressure unusually weak or tired Side effects that usually do not require medical attention (report to your doctor or health care professional if they continue or are bothersome): change in sex drive or performance changes in breast size in both males and females changes in emotions or moods headache hot flashes irritation at site where injected loss of appetite skin problems like acne, dry skin vaginal dryness This list may not describe all possible side effects. Call your doctor for medical advice about side effects. You may report side effects to FDA at 1-800-FDA-1088. Where should I keep my medication? This drug is given in a hospital or clinic and will not be stored at home. NOTE: This sheet is a summary. It may not cover all possible information. If you have questions about this medicine, talk to your doctor, pharmacist, or health care provider.  2022 Elsevier/Gold Standard (2018-08-20 14:05:56)  

## 2021-03-02 ENCOUNTER — Inpatient Hospital Stay: Payer: No Typology Code available for payment source | Attending: Oncology

## 2021-03-02 ENCOUNTER — Other Ambulatory Visit: Payer: Self-pay

## 2021-03-02 VITALS — BP 167/74 | HR 96 | Temp 98.6°F | Resp 16

## 2021-03-02 DIAGNOSIS — Z170 Estrogen receptor positive status [ER+]: Secondary | ICD-10-CM | POA: Insufficient documentation

## 2021-03-02 DIAGNOSIS — C50511 Malignant neoplasm of lower-outer quadrant of right female breast: Secondary | ICD-10-CM | POA: Insufficient documentation

## 2021-03-02 MED ORDER — GOSERELIN ACETATE 3.6 MG ~~LOC~~ IMPL
3.6000 mg | DRUG_IMPLANT | Freq: Once | SUBCUTANEOUS | Status: AC
  Administered 2021-03-02: 3.6 mg via SUBCUTANEOUS
  Filled 2021-03-02: qty 3.6

## 2021-03-17 ENCOUNTER — Other Ambulatory Visit: Payer: Self-pay | Admitting: Family Medicine

## 2021-03-19 ENCOUNTER — Encounter: Payer: Self-pay | Admitting: Family Medicine

## 2021-03-19 ENCOUNTER — Telehealth: Payer: Self-pay | Admitting: Family Medicine

## 2021-03-19 ENCOUNTER — Other Ambulatory Visit: Payer: Self-pay

## 2021-03-19 ENCOUNTER — Ambulatory Visit (INDEPENDENT_AMBULATORY_CARE_PROVIDER_SITE_OTHER): Payer: No Typology Code available for payment source | Admitting: Family Medicine

## 2021-03-19 VITALS — BP 122/74 | HR 82 | Temp 98.0°F | Ht 63.0 in | Wt 182.0 lb

## 2021-03-19 DIAGNOSIS — E119 Type 2 diabetes mellitus without complications: Secondary | ICD-10-CM

## 2021-03-19 DIAGNOSIS — E039 Hypothyroidism, unspecified: Secondary | ICD-10-CM

## 2021-03-19 DIAGNOSIS — Z0000 Encounter for general adult medical examination without abnormal findings: Secondary | ICD-10-CM | POA: Diagnosis not present

## 2021-03-19 DIAGNOSIS — Z7189 Other specified counseling: Secondary | ICD-10-CM

## 2021-03-19 DIAGNOSIS — Z23 Encounter for immunization: Secondary | ICD-10-CM | POA: Diagnosis not present

## 2021-03-19 DIAGNOSIS — C50511 Malignant neoplasm of lower-outer quadrant of right female breast: Secondary | ICD-10-CM

## 2021-03-19 DIAGNOSIS — Z1159 Encounter for screening for other viral diseases: Secondary | ICD-10-CM

## 2021-03-19 DIAGNOSIS — F419 Anxiety disorder, unspecified: Secondary | ICD-10-CM

## 2021-03-19 LAB — COMPREHENSIVE METABOLIC PANEL
ALT: 32 U/L (ref 0–35)
AST: 38 U/L — ABNORMAL HIGH (ref 0–37)
Albumin: 4.1 g/dL (ref 3.5–5.2)
Alkaline Phosphatase: 69 U/L (ref 39–117)
BUN: 18 mg/dL (ref 6–23)
CO2: 31 mEq/L (ref 19–32)
Calcium: 9.3 mg/dL (ref 8.4–10.5)
Chloride: 104 mEq/L (ref 96–112)
Creatinine, Ser: 0.67 mg/dL (ref 0.40–1.20)
GFR: 101.17 mL/min (ref >60.00)
Glucose, Bld: 177 mg/dL — ABNORMAL HIGH (ref 70–99)
Potassium: 4.3 mEq/L (ref 3.5–5.1)
Sodium: 141 mEq/L (ref 135–145)
Total Bilirubin: 0.5 mg/dL (ref 0.2–1.2)
Total Protein: 7.2 g/dL (ref 6.0–8.3)

## 2021-03-19 LAB — MICROALBUMIN / CREATININE URINE RATIO
Creatinine,U: 150.1 mg/dL
Microalb Creat Ratio: 1.6 mg/g (ref 0.0–30.0)
Microalb, Ur: 2.4 mg/dL — ABNORMAL HIGH (ref 0.0–1.9)

## 2021-03-19 LAB — LIPID PANEL
Cholesterol: 180 mg/dL (ref 0–200)
HDL: 41 mg/dL (ref >39.00)
NonHDL: 138.63
Total CHOL/HDL Ratio: 4
Triglycerides: 239 mg/dL — ABNORMAL HIGH (ref 0.0–149.0)
VLDL: 47.8 mg/dL — ABNORMAL HIGH (ref 0.0–40.0)

## 2021-03-19 LAB — CBC WITH DIFFERENTIAL/PLATELET
Basophils Absolute: 0 10*3/uL (ref 0.0–0.1)
Basophils Relative: 0.5 % (ref 0.0–3.0)
Eosinophils Absolute: 0.1 10*3/uL (ref 0.0–0.7)
Eosinophils Relative: 0.9 % (ref 0.0–5.0)
HCT: 41.5 % (ref 36.0–46.0)
Hemoglobin: 14 g/dL (ref 12.0–15.0)
Lymphocytes Relative: 23.4 % (ref 12.0–46.0)
Lymphs Abs: 1.9 10*3/uL (ref 0.7–4.0)
MCHC: 33.6 g/dL (ref 30.0–36.0)
MCV: 89.7 fl (ref 78.0–100.0)
Monocytes Absolute: 0.4 10*3/uL (ref 0.1–1.0)
Monocytes Relative: 5.4 % (ref 3.0–12.0)
Neutro Abs: 5.6 10*3/uL (ref 1.4–7.7)
Neutrophils Relative %: 69.8 % (ref 43.0–77.0)
Platelets: 223 10*3/uL (ref 150.0–400.0)
RBC: 4.63 Mil/uL (ref 3.87–5.11)
RDW: 13.5 % (ref 11.5–15.5)
WBC: 8.1 10*3/uL (ref 4.0–10.5)

## 2021-03-19 LAB — TSH: TSH: 5.27 u[IU]/mL (ref 0.35–5.50)

## 2021-03-19 LAB — HEMOGLOBIN A1C: Hgb A1c MFr Bld: 7.8 % — ABNORMAL HIGH (ref 4.6–6.5)

## 2021-03-19 LAB — LDL CHOLESTEROL, DIRECT: Direct LDL: 86 mg/dL

## 2021-03-19 MED ORDER — VALACYCLOVIR HCL 1 G PO TABS: 2000.0000 mg | ORAL_TABLET | Freq: Two times a day (BID) | ORAL | 5 refills | Status: DC | PRN

## 2021-03-19 MED ORDER — ESCITALOPRAM OXALATE 10 MG PO TABS: 10.0000 mg | ORAL_TABLET | Freq: Every day | ORAL | 3 refills | Status: DC

## 2021-03-19 MED ORDER — ESCITALOPRAM OXALATE 10 MG PO TABS: 10.0000 mg | ORAL_TABLET | Freq: Every day | ORAL | Status: DC

## 2021-03-19 NOTE — Progress Notes (Signed)
This visit occurred during the SARS-CoV-2 public health emergency.  Safety protocols were in place, including screening questions prior to the visit, additional usage of staff PPE, and extensive cleaning of exam room while observing appropriate contact time as indicated for disinfecting solutions.  CPE- See plan.  Routine anticipatory guidance given to patient.  See health maintenance.  The possibility exists that previously documented standard health maintenance information may have been brought forward from a previous encounter into this note.  If needed, that same information has been updated to reflect the current situation based on today's encounter.    Tetanus 2022 Flu 2022 PNA encouraged.  shingles d/w pt.  covid vaccine d/w pt.  Encouraged.   Diet and exercise d/w pt.  encouraged both.   Cologuard neg 2022 Pap per gyn. I'll defer.  [facility].  DXA not due.  Mammogram d/w pt.  Per onc.  Due for f/u.  D/w pt.   HCV screening d/w pt. She opts in 2022.   HIV screening d/w pt. Prev done with prenatal care.   Living will d/w pt.  Would have her sons Aileen Pilot and Verlin Fester equally designated if patient were incapacitated.  If needed, her mother would designated next if they were unavailable.     Diabetes:  No meds.   Hypoglycemic episodes: no sx  Hyperglycemic episodes: no sx Feet problems: no Blood Sugars averaging: not checked.   eye exam within last year: recently done, no mention of DM2 changes per patient report.  [facility] on [employer].    Mood d/w pt.  Still on lexapro.   Not tearful but work stressors and life stressor noted.  She thought lexapro helped and wanted to continue it.    Hypothyoridism.  Compliant.  No ADE on med.  TSH pending. No neck mass.  Some fatigue noted.  She has a routine sleep schedule.    Breast cancer hx noted, followed by onc.  I'll defer.  She agrees.   She is meeting with plastic surgeon soon.    PMH and SH reviewed  Meds, vitals, and  allergies reviewed.   ROS: Per HPI.  Unless specifically indicated otherwise in HPI, the patient denies:  General: fever. Eyes: acute vision changes ENT: sore throat Cardiovascular: chest pain Respiratory: SOB GI: vomiting GU: dysuria Musculoskeletal: acute back pain Derm: acute rash Neuro: acute motor dysfunction Psych: worsening mood Endocrine: polydipsia Heme: bleeding Allergy: hayfever  GEN: nad, alert and oriented HEENT: ncat NECK: supple w/o LA CV: rrr. PULM: ctab, no inc wob ABD: soft, +bs EXT: no edema SKIN: no acute rash  Diabetic foot exam: Normal inspection No skin breakdown No calluses  Normal DP pulses Normal sensation to light touch and monofilament Nails normal

## 2021-03-19 NOTE — Patient Instructions (Addendum)
Go to the lab on the way out.   If you have mychart we'll likely use that to update you.    Take care.  Glad to see you.  Ask the front about getting a report from [place] on [employer].

## 2021-03-19 NOTE — Assessment & Plan Note (Signed)
Living will d/w pt.  Would have her sons Aileen Pilot and Verlin Fester equally designated if patient were incapacitated.  If needed, her mother would designated next if they were unavailable.

## 2021-03-19 NOTE — Telephone Encounter (Signed)
Called [facility] to get fax number. Faxed records request form to [facility] for eye exam results.

## 2021-03-19 NOTE — Telephone Encounter (Signed)
Pt stated that Dr.Duncan would like a copy of her eye exam from her eye doctor  [facility]- [phone]; she said to give them a call and they should be able to send it over to our office

## 2021-03-21 ENCOUNTER — Other Ambulatory Visit: Payer: Self-pay | Admitting: Family Medicine

## 2021-03-21 DIAGNOSIS — R809 Proteinuria, unspecified: Secondary | ICD-10-CM

## 2021-03-21 DIAGNOSIS — E1129 Type 2 diabetes mellitus with other diabetic kidney complication: Secondary | ICD-10-CM

## 2021-03-21 DIAGNOSIS — E119 Type 2 diabetes mellitus without complications: Secondary | ICD-10-CM

## 2021-03-21 NOTE — Assessment & Plan Note (Signed)
No medication.  Continue work on diet and exercise.  See notes on labs.

## 2021-03-21 NOTE — Assessment & Plan Note (Signed)
Tetanus 2022 Flu 2022 PNA encouraged.  shingles d/w pt.  covid vaccine d/w pt.  Encouraged.   Diet and exercise d/w pt.  encouraged both.   Cologuard neg 2022 Pap per gyn. I'll defer.  [facility].  DXA not due.  Mammogram d/w pt.  Per onc.  Due for f/u.  D/w pt.   HCV screening d/w pt. She opts in 2022.   HIV screening d/w pt. Prev done with prenatal care.   Living will d/w pt.  Would have her sons Aileen Pilot and Verlin Fester equally designated if patient were incapacitated.  If needed, her mother would designated next if they were unavailable.

## 2021-03-21 NOTE — Assessment & Plan Note (Signed)
Continue levothyroxine as is.  See notes on labs. 

## 2021-03-21 NOTE — Assessment & Plan Note (Signed)
Still on lexapro.   Not tearful but work stressors and life stressor noted.  She thought lexapro helped and wanted to continue it.   Would continue as is.

## 2021-03-21 NOTE — Assessment & Plan Note (Signed)
History of breast cancer, per oncology.  I will defer.  She agrees.  She is going to follow-up with plastic surgery.

## 2021-03-22 LAB — HEPATITIS C ANTIBODY
Hepatitis C Ab: NONREACTIVE
SIGNAL TO CUT-OFF: 0.04 (ref <1.00)

## 2021-03-23 ENCOUNTER — Ambulatory Visit (INDEPENDENT_AMBULATORY_CARE_PROVIDER_SITE_OTHER): Payer: No Typology Code available for payment source | Admitting: Plastic Surgery

## 2021-03-23 ENCOUNTER — Other Ambulatory Visit: Payer: Self-pay

## 2021-03-23 ENCOUNTER — Encounter: Payer: Self-pay | Admitting: Plastic Surgery

## 2021-03-23 ENCOUNTER — Encounter: Payer: Self-pay | Admitting: Oncology

## 2021-03-23 ENCOUNTER — Ambulatory Visit: Payer: No Typology Code available for payment source | Admitting: Oncology

## 2021-03-23 ENCOUNTER — Other Ambulatory Visit: Payer: No Typology Code available for payment source

## 2021-03-23 DIAGNOSIS — Z9889 Other specified postprocedural states: Secondary | ICD-10-CM | POA: Diagnosis not present

## 2021-03-23 NOTE — Progress Notes (Signed)
   Subjective:    Patient ID: Christy Ferrell, female    DOB: 12-21-1970, 50 y.o.   MRN: [id number]  The patient is a 50 year old female here for follow-up on her breast surgery.  She underwent a mastectomy with implant-based reconstruction.  She had exchange of her right breast expander for an implant in January with a left mastopexy for symmetry.  She has a 650 cc ultrahigh profile gel implant in place.  She completed radiation in April on the right breast. Her skin is doing great.  She has some asymmetry and loss of volume in the central portion of the breast where she was boosted for the radiation.   Review of Systems  Constitutional: Negative.   HENT: Negative.    Eyes: Negative.   Respiratory: Negative.    Cardiovascular: Negative.   Gastrointestinal: Negative.   Endocrine: Negative.   Genitourinary: Negative.   Musculoskeletal: Negative.   Hematological: Negative.       Objective:   Physical Exam Vitals and nursing note reviewed.  Constitutional:      Appearance: Normal appearance.  HENT:     Head: Normocephalic and atraumatic.  Cardiovascular:     Rate and Rhythm: Normal rate.     Pulses: Normal pulses.  Pulmonary:     Effort: Pulmonary effort is normal.  Abdominal:     Palpations: Abdomen is soft.  Skin:    General: Skin is warm.     Capillary Refill: Capillary refill takes less than 2 seconds.  Neurological:     Mental Status: She is alert and oriented to person, place, and time.  Psychiatric:        Mood and Affect: Mood normal.        Behavior: Behavior normal.        Thought Content: Thought content normal.      Assessment & Plan:     ICD-10-CM   1. S/P breast reconstruction, right  [phone]       Pictures were obtained of the patient and placed in the chart with the patient's or guardian's permission. Plan for symmetry surgery with Lipo filling and liposuction of the right breast. Patient still thinking about whether or not she wants to do a tattoo.

## 2021-03-30 ENCOUNTER — Inpatient Hospital Stay: Payer: No Typology Code available for payment source | Attending: Oncology

## 2021-03-30 ENCOUNTER — Other Ambulatory Visit: Payer: Self-pay

## 2021-03-30 VITALS — BP 131/71 | HR 90 | Temp 98.7°F | Resp 17

## 2021-03-30 DIAGNOSIS — Z170 Estrogen receptor positive status [ER+]: Secondary | ICD-10-CM | POA: Insufficient documentation

## 2021-03-30 DIAGNOSIS — Z9011 Acquired absence of right breast and nipple: Secondary | ICD-10-CM | POA: Insufficient documentation

## 2021-03-30 DIAGNOSIS — Z5111 Encounter for antineoplastic chemotherapy: Secondary | ICD-10-CM | POA: Diagnosis present

## 2021-03-30 DIAGNOSIS — C50511 Malignant neoplasm of lower-outer quadrant of right female breast: Secondary | ICD-10-CM | POA: Insufficient documentation

## 2021-03-30 DIAGNOSIS — Z79810 Long term (current) use of selective estrogen receptor modulators (SERMs): Secondary | ICD-10-CM | POA: Diagnosis not present

## 2021-03-30 MED ORDER — GOSERELIN ACETATE 3.6 MG ~~LOC~~ IMPL
3.6000 mg | DRUG_IMPLANT | Freq: Once | SUBCUTANEOUS | Status: AC
  Administered 2021-03-30: 3.6 mg via SUBCUTANEOUS
  Filled 2021-03-30: qty 3.6

## 2021-04-07 ENCOUNTER — Other Ambulatory Visit: Payer: Self-pay | Admitting: Family Medicine

## 2021-04-07 MED ORDER — METFORMIN HCL 500 MG PO TABS: ORAL_TABLET | ORAL | 1 refills | Status: DC

## 2021-04-29 ENCOUNTER — Inpatient Hospital Stay: Payer: No Typology Code available for payment source | Attending: Oncology

## 2021-04-29 ENCOUNTER — Other Ambulatory Visit: Payer: Self-pay

## 2021-04-29 VITALS — BP 138/74 | HR 88 | Temp 98.1°F | Resp 16

## 2021-04-29 DIAGNOSIS — Z79810 Long term (current) use of selective estrogen receptor modulators (SERMs): Secondary | ICD-10-CM | POA: Diagnosis not present

## 2021-04-29 DIAGNOSIS — C50511 Malignant neoplasm of lower-outer quadrant of right female breast: Secondary | ICD-10-CM | POA: Diagnosis present

## 2021-04-29 DIAGNOSIS — Z9011 Acquired absence of right breast and nipple: Secondary | ICD-10-CM | POA: Insufficient documentation

## 2021-04-29 DIAGNOSIS — Z5111 Encounter for antineoplastic chemotherapy: Secondary | ICD-10-CM | POA: Diagnosis present

## 2021-04-29 DIAGNOSIS — Z170 Estrogen receptor positive status [ER+]: Secondary | ICD-10-CM | POA: Insufficient documentation

## 2021-04-29 MED ORDER — GOSERELIN ACETATE 3.6 MG ~~LOC~~ IMPL
3.6000 mg | DRUG_IMPLANT | Freq: Once | SUBCUTANEOUS | Status: AC
  Administered 2021-04-29: 3.6 mg via SUBCUTANEOUS
  Filled 2021-04-29: qty 3.6

## 2021-05-03 NOTE — Progress Notes (Signed)
[place]  Telephone:([phone] Fax:(336) [phone]     ID: Christy Ferrell DOB: May 13, 1971  MR#: [id number]  IFO#:[id number]  Patient Care Team: [name], MD as PCP - General (Family Medicine) Mauro Kaufmann, RN as Oncology Nurse Navigator Rockwell Germany, RN as Oncology Nurse Navigator Arliss Frisina, Virgie Dad, MD as Consulting Physician (Oncology) Jovita Kussmaul, MD as Consulting Physician (General Surgery) Dillingham, Loel Lofty, DO as Attending Physician (Plastic Surgery) Chauncey Cruel, MD OTHER MD:  CHIEF COMPLAINT: Estrogen receptor positive breast cancer (s/p right mastectomy)  CURRENT TREATMENT: tamoxifen, goserelin   INTERVAL HISTORY: Christy "Christy Ferrell" returns today for follow up of her estrogen receptor positive breast cancer.   She started tamoxifen on 02/13/2020.  She was having significant night sweats and we started gabapentin but at 300 mg nightly that was too much and made her woozy in the morning.  Since her last visit, she underwent breast MRI on 09/20/2020 showing: breast composition B; previously seen 0.5 cm enhancing mass in lower-inner left breast no longer identified; no evidence of malignancy in reconstructed right breast.  She receives goserelin monthly, with a dose due today.  She tolerates this remarkably well.  She is overdue for annual left screening mammography.   REVIEW OF SYSTEMS: Christy Ferrell tells me she tends to have frequent vaginal yeast infections.  She is being treated with Lotrisone and occasionally Diflucan.  She exercises by walking mostly at work.  She does not count her steps.  She tells me she is going to need some further plastic work in the right reconstructed breast.  A detailed review of systems today was otherwise stable.   COVID 19 VACCINATION STATUS: Not vaccinated as of December 2022   HISTORY OF CURRENT ILLNESS: From the original visit note:  AMBRIEL GORELICK herself palpated a right breast mass. She  underwent bilateral diagnostic mammography with tomography and right breast ultrasonography at [facility] on 01/15/2020 showing: breast density category B; palpable 1.6 cm mass in right breast at 5:30, with adjacent 5 mm probable satellite mass; 0.9 cm mass in retroareolar right breast, also at 5:30; two enlarged lymph nodes in right breast at 9:30.  Accordingly on 01/17/2020 she proceeded to biopsy of the right breast areas in question. The pathology from this procedure ([id number]) showed:  1. Right Breast, 5:30  - invasive mammary carcinoma, grade 2  - ductal carcinoma in situ, low grade.   - Prognostic indicators significant for: estrogen receptor, 95% positive and progesterone receptor, 95% positive, both with strong staining intensity. Proliferation marker Ki67 not obtained. HER2 negative by immunohistochemistry (0). 2. Right Breast, retroareolar  - ductal carcinoma in situ 3. Lymph Node, right axilla  - invasive mammary carcinoma  - lymph node tissue not identified  We do not have an e-cadherin or Ki67 on this tissue.  The patient's subsequent history is as detailed below.   PAST MEDICAL HISTORY: Past Medical History:  Diagnosis Date   Anxiety    h/o, was prev on zoloft   Breast cancer ([place]) 01/2020   seeing Dr. Marlou Starks   Cholelithiasis    Diabetes mellitus without complication [facility])    Family history of breast cancer    Family history of leukemia    Family history of lung cancer    Family history of prostate cancer    Family history of stomach cancer    Hypothyroidism    postpartum hypothyroidism   IUD (intrauterine device) in place    mirena per  [facility]   Major depression     PAST SURGICAL HISTORY: Past Surgical History:  Procedure Laterality Date   BREAST RECONSTRUCTION WITH PLACEMENT OF TISSUE EXPANDER AND FLEX HD (ACELLULAR HYDRATED DERMIS) Right 03/30/2020   Procedure: BREAST RECONSTRUCTION WITH PLACEMENT OF TISSUE EXPANDER AND FLEX HD (ACELLULAR  HYDRATED DERMIS);  Surgeon: Peggye Form, DO;  Location: [facility];  Service: Plastics;  Laterality: Right;   BREAST REDUCTION WITH MASTOPEXY Left 05/28/2020   Procedure: BREAST REDUCTION WITH MASTOPEXY;  Surgeon: Peggye Form, DO;  Location: [facility];  Service: Plastics;  Laterality: Left;   CESAREAN SECTION     X 2 Fetal stress, second scheduled   CHOLECYSTECTOMY  05/1998   MASTECTOMY MODIFIED RADICAL Right 03/30/2020   Procedure: RIGHT MASTECTOMY MODIFIED RADICAL;  Surgeon: Griselda Miner, MD;  Location: [facility];  Service: General;  Laterality: Right;  PEC BLOCK, RNFA   REMOVAL OF TISSUE EXPANDER AND PLACEMENT OF IMPLANT Right 05/28/2020   Procedure: REMOVAL OF TISSUE EXPANDER AND PLACEMENT OF IMPLANT;  Surgeon: Peggye Form, DO;  Location: [facility];  Service: Plastics;  Laterality: Right;  2.5 hours, please    FAMILY HISTORY: Family History  Problem Relation Age of Onset   Hypertension Father    Depression Father    Prostate cancer Father 49       Prostate, S/P prostatectomy, metastatic   Asthma Sister    Spina bifida Sister    Hypothyroidism Mother    Hypertension Maternal Grandmother    Hyperlipidemia Maternal Grandmother    Lung disease Maternal Grandmother    Lung cancer Maternal Grandmother 58   Hypothyroidism Maternal Grandfather    Prostate cancer Maternal Grandfather    Cancer Maternal Grandfather        lung, stomach, and prostate cancers   Emphysema Paternal Grandmother    Diabetes Maternal Uncle    Diabetes Maternal Uncle    Leukemia Maternal Uncle 29   Prostate cancer Maternal Uncle        dx in his 60s/70s, metastatic   Breast cancer Other 45       negative genetic testing; maternal first cousin   Stroke Neg Hx    Colon cancer Neg Hx    The patient's father died at age 95 from prostate cancer.  The patient's mother is 58 years old as of September 2021.  The patient has 1  uncle with pancreatic cancer and one cousin on the same side of the family with breast cancer.   GYNECOLOGIC HISTORY:  No LMP recorded. (Menstrual status: Other). Menarche: 50 years old Age at first live birth: 50 years old GX P 2 Contraceptive Mirena in place Hysterectomy?  No BSO?  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  She is divorced.  She lives alone although her significant other Aris Everts (who is disabled secondary to COPD) sometimes comes over.  Her sons are Gay Filler (24), who is studying dentistry at [employer], and [place] (21), who is also at [employer] considering dentistry or physical therapy.   ADVANCED DIRECTIVES: Not in place.  The patient intends to name her older son as her healthcare power of attorney and the appropriate documents were giving her on 01/29/2020 for her to complete and notarize at her discretion   HEALTH MAINTENANCE: Social History   Tobacco Use   Smoking status: Never  Smokeless tobacco: Never  Vaping Use   Vaping Use: Never used  Substance Use Topics   Alcohol use: Yes    Alcohol/week: 0.0 standard drinks    Comment: Very rarely.   Drug use: No     Colonoscopy:   PAP: Not up-to-date  Bone density: Never   Allergies  Allergen Reactions   Codeine Nausea And Vomiting   Zoloft [Sertraline Hcl]     headaches    Current Outpatient Medications  Medication Sig Dispense Refill   acyclovir ointment (ZOVIRAX) 5 % Apply 1 application topically every 3 (three) hours. As needed. 5 g 5   cholecalciferol (VITAMIN D3) 25 MCG (1000 UNIT) tablet Take 1,000 Units by mouth daily.     clotrimazole-betamethasone (LOTRISONE) cream Apply externally BID prn sx up to 2 wks 15 g 0   cyclobenzaprine (FLEXERIL) 10 MG tablet TAKE 1/2 TO 1 TABLET BY MOUTH EVERY DAY AS NEEDED 30 tablet 5   escitalopram (LEXAPRO) 10 MG tablet Take 1 tablet (10 mg total) by mouth  daily. 90 tablet 3   goserelin (ZOLADEX) 3.6 MG injection Inject 3.6 mg into the skin every 28 (twenty-eight) days.     ibuprofen (ADVIL,MOTRIN) 200 MG tablet Take 200 mg by mouth every 6 (six) hours as needed.     levothyroxine (SYNTHROID) 25 MCG tablet TAKE 1 TABLET BY MOUTH EVERY DAY BEFORE BREAKFAST 30 tablet 2   metFORMIN (GLUCOPHAGE) 500 MG tablet take 500 mg of metformin daily and then increase to twice a day after 2 weeks if tolerated 180 tablet 1   tamoxifen (NOLVADEX) 20 MG tablet Take 1 tablet (20 mg total) by mouth daily. 90 tablet 4   Turmeric (QC TUMERIC COMPLEX PO) Take by mouth.     valACYclovir (VALTREX) 1000 MG tablet Take 2 tablets (2,000 mg total) by mouth 2 (two) times daily as needed. For 1 day. 20 tablet 5   No current facility-administered medications for this visit.    OBJECTIVE: White woman who appears well  Vitals:   05/04/21 1419  BP: (!) 153/82  Pulse: 88  Resp: 16  Temp: 97.9 F (36.6 C)  SpO2: 96%     Body mass index is 32.29 kg/m.   Wt Readings from Last 3 Encounters:  05/04/21 182 lb 4.8 oz (82.7 kg)  03/19/21 182 lb (82.6 kg)  01/14/21 180 lb (81.6 kg)     ECOG FS:1 - Symptomatic but completely ambulatory  Sclerae unicteric, EOMs intact Wearing a mask No cervical or supraclavicular adenopathy Lungs no rales or rhonchi Heart regular rate and rhythm Abd soft, nontender, positive bowel sounds MSK no focal spinal tenderness, no upper extremity lymphedema Neuro: nonfocal, well oriented, appropriate affect Breasts: The right breast is status postmastectomy and reconstruction.  There is a slight depression in the area where the nipple should be.  Otherwise this is very smooth and there is no evidence of local recurrence.  Left breast is benign.  Both axillae are benign.   LAB RESULTS:  CMP     Component Value Date/Time   NA 141 03/19/2021 1022   K 4.3 03/19/2021 1022   CL 104 03/19/2021 1022   CO2 31 03/19/2021 1022   GLUCOSE 177 (H)  03/19/2021 1022   BUN 18 03/19/2021 1022   CREATININE 0.67 03/19/2021 1022   CREATININE 0.60 09/15/2020 1431   CALCIUM 9.3 03/19/2021 1022   PROT 7.2 03/19/2021 1022   ALBUMIN 4.1 03/19/2021 1022   AST 38 (H) 03/19/2021 1022   AST  37 09/15/2020 1431   ALT 32 03/19/2021 1022   ALT 32 09/15/2020 1431   ALKPHOS 69 03/19/2021 1022   BILITOT 0.5 03/19/2021 1022   BILITOT 0.4 09/15/2020 1431   GFRNONAA >60 09/15/2020 1431   GFRAA >60 01/29/2020 1526    No results found for: TOTALPROTELP, ALBUMINELP, A1GS, A2GS, BETS, BETA2SER, GAMS, MSPIKE, SPEI  Lab Results  Component Value Date   WBC 9.1 05/04/2021   NEUTROABS 5.6 05/04/2021   HGB 14.0 05/04/2021   HCT 40.2 05/04/2021   MCV 86.8 05/04/2021   PLT 237 05/04/2021    No results found for: LABCA2  No components found for: NATFTD322  No results for input(s): INR in the last 168 hours.  No results found for: LABCA2  No results found for: GUR427  No results found for: CWC376  No results found for: EGB151  No results found for: CA2729  No components found for: HGQUANT  No results found for: CEA1 / No results found for: CEA1   No results found for: AFPTUMOR  No results found for: CHROMOGRNA  No results found for: KPAFRELGTCHN, LAMBDASER, KAPLAMBRATIO (kappa/lambda light chains)  No results found for: HGBA, HGBA2QUANT, HGBFQUANT, HGBSQUAN (Hemoglobinopathy evaluation)   No results found for: LDH  No results found for: IRON, TIBC, IRONPCTSAT (Iron and TIBC)  No results found for: FERRITIN  Urinalysis    Component Value Date/Time   BILIRUBINUR negative 02/25/2015 1645   PROTEINUR + 0.15 02/25/2015 1645   UROBILINOGEN 0.2 02/25/2015 1645   NITRITE negative 02/25/2015 1645   LEUKOCYTESUR small (1+) (A) 02/25/2015 1645    STUDIES: No results found.   ELIGIBLE FOR AVAILABLE RESEARCH PROTOCOL: AET  ASSESSMENT: 51 y.o. [place] woman status post right breast lower outer quadrant biopsy 01/17/2020  for a  clinical T1c N1, stage IB invasive mammary carcinoma, grade 2, estrogen and progesterone receptor positive, HER-2 not amplified  (a) biopsy of the left axillary "lymph node" was positive but included no lymph node tissue  (b) biopsy of a suspicious periareoral area was positive for ductal carcinoma in situ, grade 1  (c) biopsy of a left breast LOQ mass noted on 02/06/2020 breast MRI pending  (d) CT chest and bone scan 02/11/2020 show no evidence of metastatic disease  (1) genetics testing 02/15/2020 through the [facility] Common Hereditary Cancers Panel.  Found no deleterious mutations in APC, ATM, AXIN2, BARD1, BMPR1A, BRCA1, BRCA2, BRIP1, CDH1, CDK4, CDKN2A (p14ARF), CDKN2A (p16INK4a), CHEK2, CTNNA1, DICER1, EPCAM (Deletion/duplication testing only), GREM1 (promoter region deletion/duplication testing only), KIT, MEN1, MLH1, MSH2, MSH3, MSH6, MUTYH, NBN, NF1, NHTL1, PALB2, PDGFRA, PMS2, POLD1, POLE, PTEN, RAD50, RAD51C, RAD51D, RNF43, SDHB, SDHC, SDHD, SMAD4, SMARCA4. STK11, TP53, TSC1, TSC2, and VHL.  The following genes were evaluated for sequence changes only: SDHA and HOXB13 c.251G>A variant only.   (2) MammaPrint obtained from the 01/17/2020 biopsy read as "low risk" predicting a 93% chance of distant disease free survival at 5 years without the need for chemotherapy  (3) tamoxifen started 02/13/2020 in anticipation of surgical delays  (a) started ovarian suppression 04/30/2020 with monthly goserelin/Zoladex  (4) status post right modified radical mastectomy 03/30/2020 for a pT3 pN1, stage IIA invasive ductal carcinoma, grade 2, with evidence of lymphovascular space involvement but negative margins  (a) a total of 9 right axillary lymph nodes removed, 2 positive with focal extracapsular extension  (b) status post right breast reconstruction 05/28/2020 with a [place] Smooth Round Ultra High Profile Gel 650 cc. Ref #[id number].  Serial Number [id number]; left mastopexy  same date  (5) adjuvant  radiation completed 08/25/2020    PLAN: Teighan is now just over a 1 year out from definitive surgery for her breast cancer with no evidence of disease activity.  This is very favorable.  She is tolerating the goserelin and tamoxifen well and the plan is to continue that for minimum of 5 years.  She tells me she will have some additional touchup surgery to the right breast, like fat grafting, sometime in the coming year.  I have suggested she exercise a little bit more regularly and a little bit longer.  She does have an elliptical at home but it is in the garage.  She knows to call for any other issues that may develop before the next visit.  Total encounter time 25 minutes.Sarajane Jews C. Lanika Colgate, MD 05/04/2021 2:43 PM Medical Oncology and Hematology [facility] [place], [place] [address] Tel. [phone]    Fax. [phone]   This document serves as a record of services personally performed by Lurline Del, MD. It was created on his behalf by Wilburn Mylar, a trained medical scribe. The creation of this record is based on the scribe's personal observations and the provider's statements to them.   I, Lurline Del MD, have reviewed the above documentation for accuracy and completeness, and I agree with the above.   *Total Encounter Time as defined by the Centers for Medicare and Medicaid Services includes, in addition to the face-to-face time of a patient visit (documented in the note above) non-face-to-face time: obtaining and reviewing outside history, ordering and reviewing medications, tests or procedures, care coordination (communications with other health care professionals or caregivers) and documentation in the medical record.

## 2021-05-04 ENCOUNTER — Other Ambulatory Visit: Payer: Self-pay

## 2021-05-04 ENCOUNTER — Inpatient Hospital Stay (HOSPITAL_BASED_OUTPATIENT_CLINIC_OR_DEPARTMENT_OTHER): Payer: No Typology Code available for payment source | Admitting: Oncology

## 2021-05-04 ENCOUNTER — Inpatient Hospital Stay: Payer: No Typology Code available for payment source

## 2021-05-04 VITALS — BP 153/82 | HR 88 | Temp 97.9°F | Resp 16 | Ht 63.0 in | Wt 182.3 lb

## 2021-05-04 DIAGNOSIS — Z170 Estrogen receptor positive status [ER+]: Secondary | ICD-10-CM

## 2021-05-04 DIAGNOSIS — C50511 Malignant neoplasm of lower-outer quadrant of right female breast: Secondary | ICD-10-CM

## 2021-05-04 DIAGNOSIS — Z5111 Encounter for antineoplastic chemotherapy: Secondary | ICD-10-CM | POA: Diagnosis not present

## 2021-05-04 LAB — COMPREHENSIVE METABOLIC PANEL
ALT: 33 U/L (ref 0–44)
AST: 48 U/L — ABNORMAL HIGH (ref 15–41)
Albumin: 4 g/dL (ref 3.5–5.0)
Alkaline Phosphatase: 66 U/L (ref 38–126)
Anion gap: 10 (ref 5–15)
BUN: 14 mg/dL (ref 6–20)
CO2: 24 mmol/L (ref 22–32)
Calcium: 9.3 mg/dL (ref 8.9–10.3)
Chloride: 105 mmol/L (ref 98–111)
Creatinine, Ser: 0.73 mg/dL (ref 0.44–1.00)
GFR, Estimated: >60 mL/min (ref >60)
Glucose, Bld: 220 mg/dL — ABNORMAL HIGH (ref 70–99)
Potassium: 3.7 mmol/L (ref 3.5–5.1)
Sodium: 139 mmol/L (ref 135–145)
Total Bilirubin: 0.5 mg/dL (ref 0.3–1.2)
Total Protein: 7.3 g/dL (ref 6.5–8.1)

## 2021-05-04 LAB — CBC WITH DIFFERENTIAL/PLATELET
Abs Immature Granulocytes: 0.03 10*3/uL (ref 0.00–0.07)
Basophils Absolute: 0 10*3/uL (ref 0.0–0.1)
Basophils Relative: 0 %
Eosinophils Absolute: 0.1 10*3/uL (ref 0.0–0.5)
Eosinophils Relative: 1 %
HCT: 40.2 % (ref 36.0–46.0)
Hemoglobin: 14 g/dL (ref 12.0–15.0)
Immature Granulocytes: 0 %
Lymphocytes Relative: 31 %
Lymphs Abs: 2.8 10*3/uL (ref 0.7–4.0)
MCH: 30.2 pg (ref 26.0–34.0)
MCHC: 34.8 g/dL (ref 30.0–36.0)
MCV: 86.8 fL (ref 80.0–100.0)
Monocytes Absolute: 0.6 10*3/uL (ref 0.1–1.0)
Monocytes Relative: 6 %
Neutro Abs: 5.6 10*3/uL (ref 1.7–7.7)
Neutrophils Relative %: 62 %
Platelets: 237 10*3/uL (ref 150–400)
RBC: 4.63 MIL/uL (ref 3.87–5.11)
RDW: 12.8 % (ref 11.5–15.5)
WBC: 9.1 10*3/uL (ref 4.0–10.5)
nRBC: 0 % (ref 0.0–0.2)

## 2021-05-04 MED ORDER — TAMOXIFEN CITRATE 20 MG PO TABS: 20.0000 mg | ORAL_TABLET | Freq: Every day | ORAL | 4 refills | Status: DC

## 2021-05-04 MED ORDER — GABAPENTIN 100 MG PO CAPS: 100.0000 mg | ORAL_CAPSULE | Freq: Three times a day (TID) | ORAL | 4 refills | Status: DC

## 2021-05-08 LAB — ESTRADIOL, ULTRA SENS: Estradiol, Sensitive: 8.7 pg/mL

## 2021-05-11 ENCOUNTER — Telehealth: Payer: Self-pay | Admitting: Oncology

## 2021-05-11 NOTE — Telephone Encounter (Signed)
Scheduled appointment per 12/20 los. Left message.

## 2021-05-27 ENCOUNTER — Other Ambulatory Visit: Payer: Self-pay

## 2021-05-27 ENCOUNTER — Encounter: Payer: Self-pay | Admitting: Oncology

## 2021-05-27 ENCOUNTER — Inpatient Hospital Stay: Payer: 59 | Attending: Oncology

## 2021-05-27 VITALS — BP 140/74 | HR 92 | Temp 98.8°F | Resp 17

## 2021-05-27 DIAGNOSIS — Z5111 Encounter for antineoplastic chemotherapy: Secondary | ICD-10-CM | POA: Insufficient documentation

## 2021-05-27 DIAGNOSIS — Z79810 Long term (current) use of selective estrogen receptor modulators (SERMs): Secondary | ICD-10-CM | POA: Diagnosis not present

## 2021-05-27 DIAGNOSIS — Z170 Estrogen receptor positive status [ER+]: Secondary | ICD-10-CM | POA: Insufficient documentation

## 2021-05-27 DIAGNOSIS — Z9011 Acquired absence of right breast and nipple: Secondary | ICD-10-CM | POA: Diagnosis not present

## 2021-05-27 DIAGNOSIS — C50511 Malignant neoplasm of lower-outer quadrant of right female breast: Secondary | ICD-10-CM | POA: Insufficient documentation

## 2021-05-27 MED ORDER — GOSERELIN ACETATE 3.6 MG ~~LOC~~ IMPL
3.6000 mg | DRUG_IMPLANT | Freq: Once | SUBCUTANEOUS | Status: AC
  Administered 2021-05-27: 3.6 mg via SUBCUTANEOUS
  Filled 2021-05-27: qty 3.6

## 2021-06-08 ENCOUNTER — Ambulatory Visit: Payer: 59 | Admitting: Family Medicine

## 2021-06-08 ENCOUNTER — Other Ambulatory Visit: Payer: Self-pay

## 2021-06-08 ENCOUNTER — Encounter: Payer: Self-pay | Admitting: Family Medicine

## 2021-06-08 DIAGNOSIS — H8110 Benign paroxysmal vertigo, unspecified ear: Secondary | ICD-10-CM

## 2021-06-08 MED ORDER — METFORMIN HCL 500 MG PO TABS: ORAL_TABLET | ORAL | 1 refills | Status: DC

## 2021-06-08 MED ORDER — MECLIZINE HCL 25 MG PO TABS: 12.5000 mg | ORAL_TABLET | Freq: Three times a day (TID) | ORAL | 1 refills | Status: DC | PRN

## 2021-06-08 MED ORDER — GABAPENTIN 100 MG PO CAPS: 100.0000 mg | ORAL_CAPSULE | Freq: Every evening | ORAL | Status: DC | PRN

## 2021-06-08 NOTE — Progress Notes (Signed)
This visit occurred during the SARS-CoV-2 public health emergency.  Safety protocols were in place, including screening questions prior to the visit, additional usage of staff PPE, and extensive cleaning of exam room while observing appropriate contact time as indicated for disinfecting solutions.  Lightheaded vs dizziness.  Discussed that she has sx with position change.  She initially had room spinning, first episode with quick onset.  1st episode was the worst.  Now with balance change that is recurrent but intermittent. No syncope.  Not lightheaded on standing if she doesn't move her head.  She has sx with leaning over and also with sitting up.  She can have sx with quick head rotation.  She has some pressure on the occiput, not midline.  No fevers.  No vomiting.  She had nausea only with first episode.    She cut out pepsi and started drinking more water.  Occ use of gabapentin, but lower dose than prev.  She didn't think that was causative- she stopped it in the meantime w/o change in sx.  Taking metformin 2 tabs in the evening.    Meds, vitals, and allergies reviewed.   ROS: Per HPI unless specifically indicated in ROS section   GEN: nad, alert and oriented HEENT: PERRL, EOMI, TM wnl B, no midline neck pain but she has some tenderness at the bilateral occiput, at the insertion of the paraspinal muscles bilaterally. NECK: supple w/o LA CV: rrr.   PULM: ctab, no inc wob ABD: soft, +bs EXT: no edema SKIN: no acute rash CN 2-12 wnl B, S/S wnl x4

## 2021-06-08 NOTE — Patient Instructions (Signed)
° °  Likely BPV.  Use the bedside exercise and meclizine if needed.  Take care.  Glad to see you.

## 2021-06-09 ENCOUNTER — Other Ambulatory Visit: Payer: Self-pay | Admitting: Hematology and Oncology

## 2021-06-09 DIAGNOSIS — H8110 Benign paroxysmal vertigo, unspecified ear: Secondary | ICD-10-CM | POA: Insufficient documentation

## 2021-06-09 DIAGNOSIS — Z170 Estrogen receptor positive status [ER+]: Secondary | ICD-10-CM

## 2021-06-09 NOTE — Assessment & Plan Note (Signed)
Likely BPV.  Discussed pathophysiology.  She can use home bedside exercise meclizine as needed.  Routine cautions given to patient.  It is likely that she has muscle tightness in the back of her neck that is contributing to the occipital discomfort.  That is likely a separate issue and she can try local heat.  She can update me as needed.

## 2021-06-21 ENCOUNTER — Ambulatory Visit
Admission: RE | Admit: 2021-06-21 | Discharge: 2021-06-21 | Disposition: A | Discharge status: Home / Self Care | Payer: 59 | Source: Ambulatory Visit | Attending: Hematology and Oncology | Admitting: Hematology and Oncology

## 2021-06-21 ENCOUNTER — Encounter: Payer: Self-pay | Admitting: Oncology

## 2021-06-21 DIAGNOSIS — Z170 Estrogen receptor positive status [ER+]: Secondary | ICD-10-CM

## 2021-06-21 DIAGNOSIS — C50511 Malignant neoplasm of lower-outer quadrant of right female breast: Secondary | ICD-10-CM

## 2021-06-21 IMAGING — MG DIGITAL SCREENING UNILAT LEFT W/ TOMO W/ CAD
6 series · 6 of 18 positions shown · non-contrast
Comparison: Previous exam(s).

CLINICAL DATA: Screening.

EXAM:
DIGITAL SCREENING UNILATERAL LEFT MAMMOGRAM WITH CAD AND
TOMOSYNTHESIS
TECHNIQUE: Left screening digital craniocaudal and mediolateral oblique
mammograms were obtained. Left screening digital breast
tomosynthesis was performed. The images were evaluated with
computer-aided detection.

[L CC synth-2D]
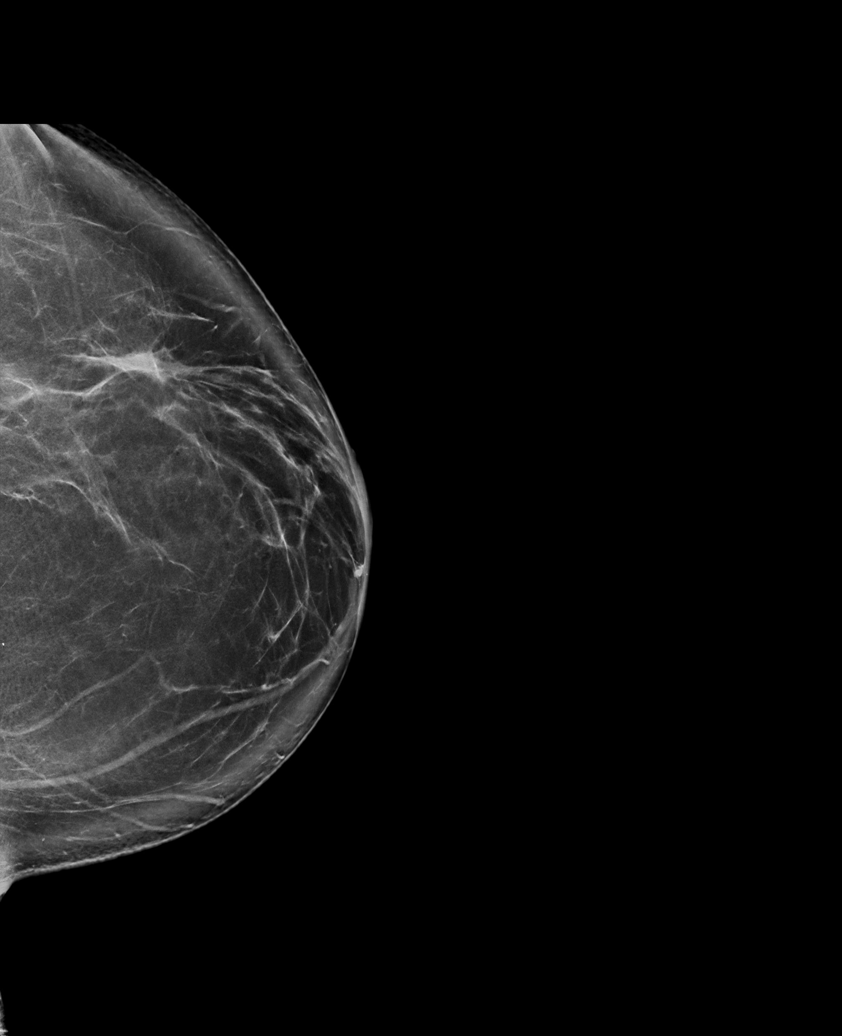

[L MLO synth-2D (1 of 2)]
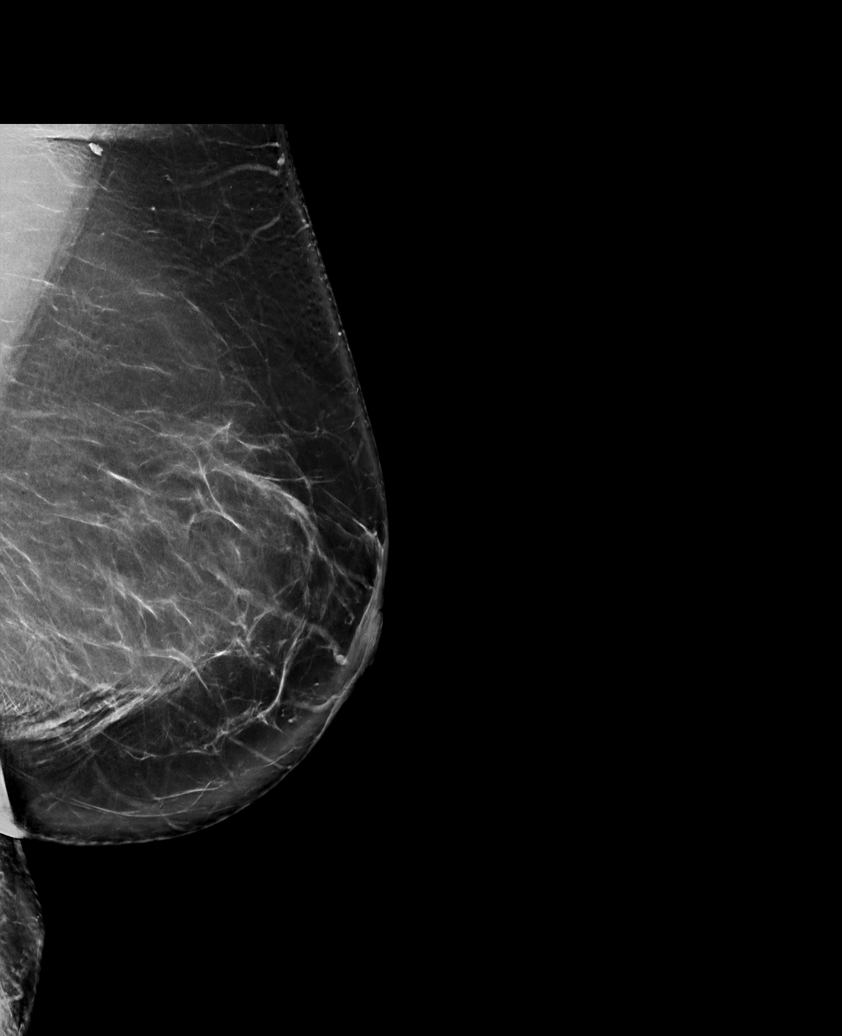

[L MLO synth-2D (2 of 2)]
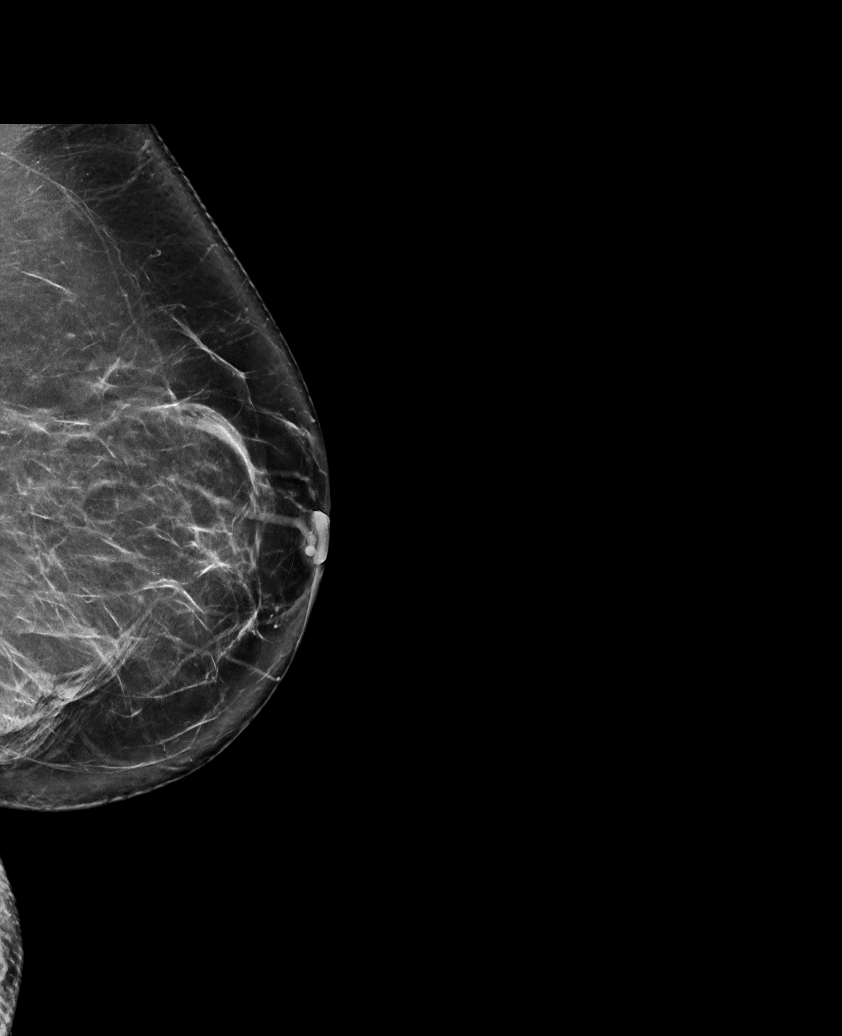

[L MLO tomo (1 of 2) · tomo slice 53/105.0]
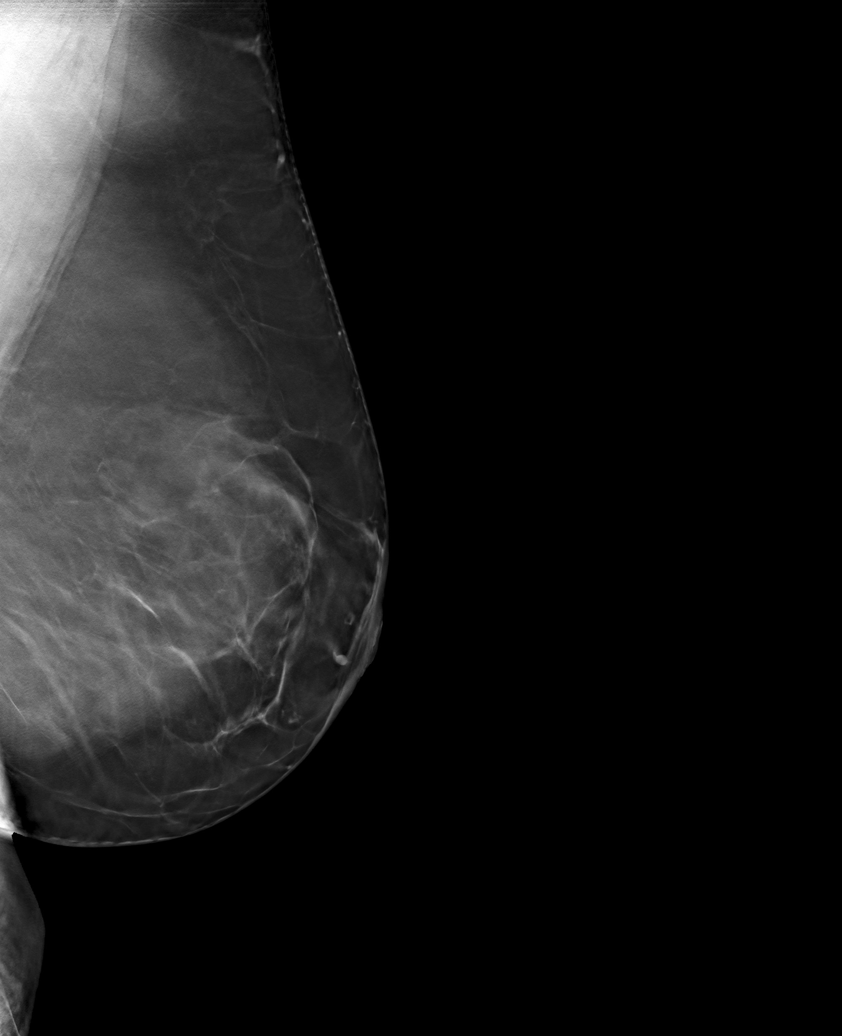

[L CC tomo · tomo slice 49/96.0]
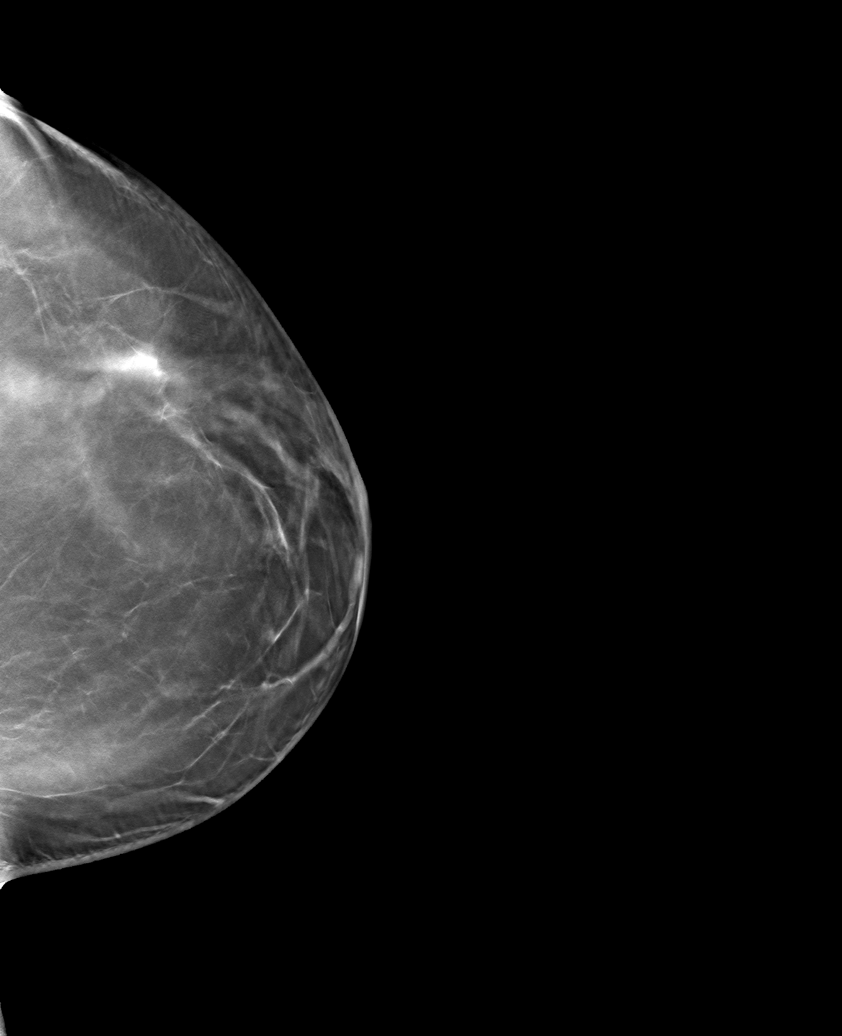

[L MLO tomo (2 of 2) · tomo slice 46/91.0]
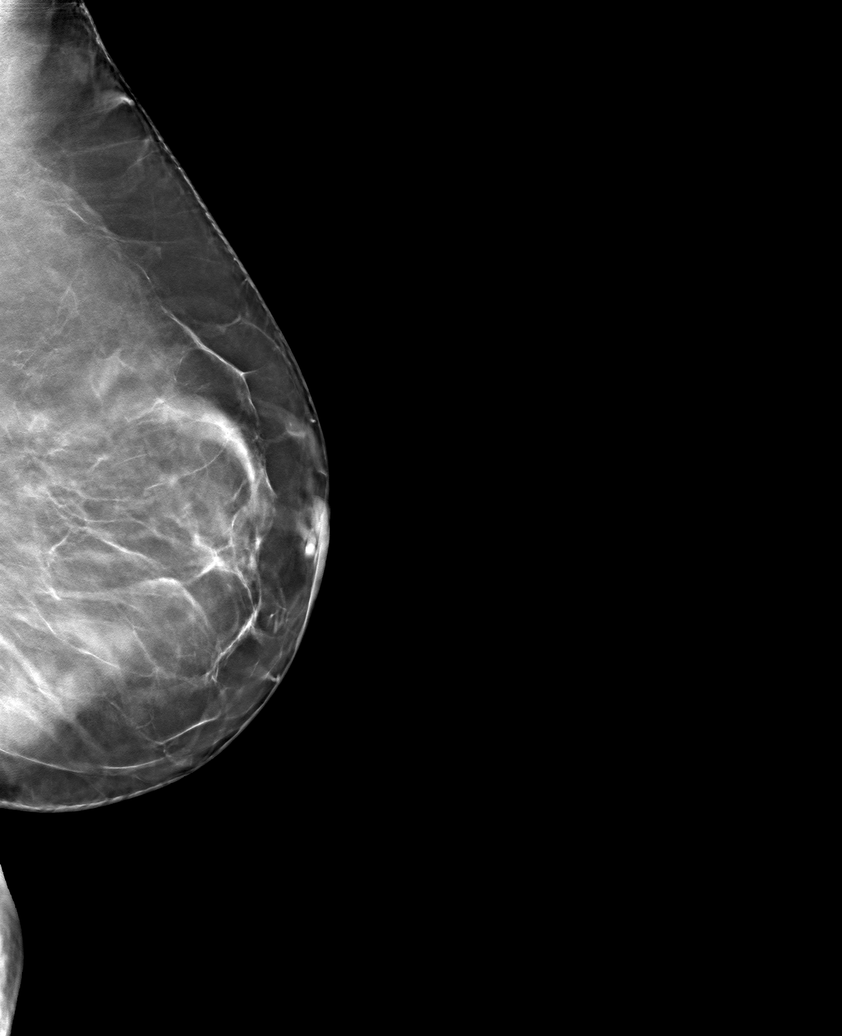

[6 of 18 positions shown; findings below may reference images not displayed]

ACR Breast Density Category b: There are scattered areas of
fibroglandular density.
FINDINGS: The patient has had a right mastectomy. There are no findings
suspicious for malignancy.
IMPRESSION: No mammographic evidence of malignancy. A result letter of this
screening mammogram will be mailed directly to the patient.

RECOMMENDATION:
Screening mammogram in one year.  (Code:[I1])

BI-RADS CATEGORY  1: Negative.

## 2021-06-24 ENCOUNTER — Other Ambulatory Visit: Payer: Self-pay

## 2021-06-24 ENCOUNTER — Inpatient Hospital Stay: Payer: 59 | Attending: Oncology

## 2021-06-24 ENCOUNTER — Inpatient Hospital Stay (HOSPITAL_BASED_OUTPATIENT_CLINIC_OR_DEPARTMENT_OTHER): Payer: 59 | Admitting: Hematology and Oncology

## 2021-06-24 ENCOUNTER — Inpatient Hospital Stay: Payer: 59

## 2021-06-24 ENCOUNTER — Encounter: Payer: Self-pay | Admitting: Hematology and Oncology

## 2021-06-24 VITALS — BP 140/78 | HR 86 | Temp 98.1°F | Resp 16 | Ht 63.0 in | Wt 181.4 lb

## 2021-06-24 DIAGNOSIS — Z170 Estrogen receptor positive status [ER+]: Secondary | ICD-10-CM | POA: Insufficient documentation

## 2021-06-24 DIAGNOSIS — Z79810 Long term (current) use of selective estrogen receptor modulators (SERMs): Secondary | ICD-10-CM | POA: Diagnosis not present

## 2021-06-24 DIAGNOSIS — C50511 Malignant neoplasm of lower-outer quadrant of right female breast: Secondary | ICD-10-CM

## 2021-06-24 DIAGNOSIS — E1129 Type 2 diabetes mellitus with other diabetic kidney complication: Secondary | ICD-10-CM

## 2021-06-24 DIAGNOSIS — Z5111 Encounter for antineoplastic chemotherapy: Secondary | ICD-10-CM | POA: Diagnosis not present

## 2021-06-24 DIAGNOSIS — R7989 Other specified abnormal findings of blood chemistry: Secondary | ICD-10-CM

## 2021-06-24 DIAGNOSIS — C50919 Malignant neoplasm of unspecified site of unspecified female breast: Secondary | ICD-10-CM

## 2021-06-24 DIAGNOSIS — Z9011 Acquired absence of right breast and nipple: Secondary | ICD-10-CM | POA: Insufficient documentation

## 2021-06-24 LAB — CBC WITH DIFFERENTIAL (CANCER CENTER ONLY)
Abs Immature Granulocytes: 0.02 10*3/uL (ref 0.00–0.07)
Basophils Absolute: 0 10*3/uL (ref 0.0–0.1)
Basophils Relative: 1 %
Eosinophils Absolute: 0.1 10*3/uL (ref 0.0–0.5)
Eosinophils Relative: 1 %
HCT: 40.3 % (ref 36.0–46.0)
Hemoglobin: 13.7 g/dL (ref 12.0–15.0)
Immature Granulocytes: 0 %
Lymphocytes Relative: 32 %
Lymphs Abs: 2.9 10*3/uL (ref 0.7–4.0)
MCH: 30.1 pg (ref 26.0–34.0)
MCHC: 34 g/dL (ref 30.0–36.0)
MCV: 88.6 fL (ref 80.0–100.0)
Monocytes Absolute: 0.5 10*3/uL (ref 0.1–1.0)
Monocytes Relative: 6 %
Neutro Abs: 5.3 10*3/uL (ref 1.7–7.7)
Neutrophils Relative %: 60 %
Platelet Count: 231 10*3/uL (ref 150–400)
RBC: 4.55 MIL/uL (ref 3.87–5.11)
RDW: 13.4 % (ref 11.5–15.5)
WBC Count: 8.9 10*3/uL (ref 4.0–10.5)
nRBC: 0 % (ref 0.0–0.2)

## 2021-06-24 LAB — COMPREHENSIVE METABOLIC PANEL
ALT: 34 U/L (ref 0–44)
AST: 48 U/L — ABNORMAL HIGH (ref 15–41)
Albumin: 4.2 g/dL (ref 3.5–5.0)
Alkaline Phosphatase: 63 U/L (ref 38–126)
Anion gap: 8 (ref 5–15)
BUN: 14 mg/dL (ref 6–20)
CO2: 28 mmol/L (ref 22–32)
Calcium: 9.1 mg/dL (ref 8.9–10.3)
Chloride: 104 mmol/L (ref 98–111)
Creatinine, Ser: 0.7 mg/dL (ref 0.44–1.00)
GFR, Estimated: >60 mL/min (ref >60)
Glucose, Bld: 190 mg/dL — ABNORMAL HIGH (ref 70–99)
Potassium: 3.7 mmol/L (ref 3.5–5.1)
Sodium: 140 mmol/L (ref 135–145)
Total Bilirubin: 0.4 mg/dL (ref 0.3–1.2)
Total Protein: 7.3 g/dL (ref 6.5–8.1)

## 2021-06-24 MED ORDER — GOSERELIN ACETATE 3.6 MG ~~LOC~~ IMPL
3.6000 mg | DRUG_IMPLANT | Freq: Once | SUBCUTANEOUS | Status: AC
  Administered 2021-06-24: 3.6 mg via SUBCUTANEOUS
  Filled 2021-06-24: qty 3.6

## 2021-06-24 NOTE — Progress Notes (Signed)
[place]  Telephone:([phone]) [phone] Fax:(336) [phone]     ID: Christy Ferrell DOB: Aug 03, 1970  MR#: [id number]  PPI#:[id number]  Patient Care Team: Christy Ghent, MD as PCP - General (Family Medicine) Christy Kaufmann, RN as Oncology Nurse Navigator Christy Germany, RN as Oncology Nurse Navigator Christy Ferrell, Christy Dad, MD (Inactive) as Consulting Physician (Oncology) Christy Kussmaul, MD as Consulting Physician (General Surgery) Christy Ferrell, Christy Lofty, DO as Attending Physician (Plastic Surgery) Christy Pike, MD OTHER MD:  CHIEF COMPLAINT: Estrogen receptor positive breast cancer (s/p right mastectomy)  CURRENT TREATMENT: tamoxifen, goserelin  INTERVAL HISTORY:  Christy Ferrell "Christy Ferrell" returns today for follow up of her estrogen receptor positive breast cancer.  She is on zoladex and tamoxifen for anti estrogen therapy. She is tolerating this combination well. Last mammogram neg of left breast done on 06/21/2021 She denies any changes in breast, breathing, bowel habits or urinary habits No new neurological complaints.  REVIEW OF SYSTEMS: A detailed review of systems today was otherwise stable.   COVID 19 VACCINATION STATUS: Not vaccinated as of December 2022   HISTORY OF CURRENT ILLNESS: From the original visit note:  Christy Ferrell herself palpated a right breast mass. She underwent bilateral diagnostic mammography with tomography and right breast ultrasonography at [facility] on 01/15/2020 showing: breast density category B; palpable 1.6 cm mass in right breast at 5:30, with adjacent 5 mm probable satellite mass; 0.9 cm mass in retroareolar right breast, also at 5:30; two enlarged lymph nodes in right breast at 9:30.  Accordingly on 01/17/2020 she proceeded to biopsy of the right breast areas in question. The pathology from this procedure ([id number]) showed:  1. Right Breast, 5:30  - invasive mammary carcinoma, grade 2  - ductal carcinoma in situ, low grade.    - Prognostic indicators significant for: estrogen receptor, 95% positive and progesterone receptor, 95% positive, both with strong staining intensity. Proliferation marker Ki67 not obtained. HER2 negative by immunohistochemistry (0). 2. Right Breast, retroareolar  - ductal carcinoma in situ 3. Lymph Node, right axilla  - invasive mammary carcinoma  - lymph node tissue not identified  We do not have an e-cadherin or Ki67 on this tissue.  The patient's subsequent history is as detailed below.   PAST MEDICAL HISTORY: Past Medical History:  Diagnosis Date   Anxiety    h/o, was prev on zoloft   Breast cancer ([place]) 01/2020   seeing Dr. Marlou Starks   Cholelithiasis    Diabetes mellitus without complication [facility])    Family history of breast cancer    Family history of leukemia    Family history of lung cancer    Family history of prostate cancer    Family history of stomach cancer    Hypothyroidism    postpartum hypothyroidism   IUD (intrauterine device) in place    mirena per [facility]   Major depression     PAST SURGICAL HISTORY: Past Surgical History:  Procedure Laterality Date   BREAST RECONSTRUCTION WITH PLACEMENT OF TISSUE EXPANDER AND FLEX HD (ACELLULAR HYDRATED DERMIS) Right 03/30/2020   Procedure: BREAST RECONSTRUCTION WITH PLACEMENT OF TISSUE EXPANDER AND FLEX HD (ACELLULAR HYDRATED DERMIS);  Surgeon: Wallace Going, DO;  Location: [place];  Service: Plastics;  Laterality: Right;   BREAST REDUCTION WITH MASTOPEXY Left 05/28/2020   Procedure: BREAST REDUCTION WITH MASTOPEXY;  Surgeon: Wallace Going, DO;  Location: [place];  Service: Plastics;  Laterality: Left;   CESAREAN SECTION  X 2 Fetal stress, second scheduled   CHOLECYSTECTOMY  05/1998   MASTECTOMY MODIFIED RADICAL Right 03/30/2020   Procedure: RIGHT MASTECTOMY MODIFIED RADICAL;  Surgeon: Christy Kussmaul, MD;  Location: [place];  Service: General;   Laterality: Right;  PEC BLOCK, RNFA   REMOVAL OF TISSUE EXPANDER AND PLACEMENT OF IMPLANT Right 05/28/2020   Procedure: REMOVAL OF TISSUE EXPANDER AND PLACEMENT OF IMPLANT;  Surgeon: Wallace Going, DO;  Location: [place];  Service: Plastics;  Laterality: Right;  2.5 hours, please    FAMILY HISTORY: Family History  Problem Relation Age of Onset   Hypertension Father    Depression Father    Prostate cancer Father 17       Prostate, S/P prostatectomy, metastatic   Asthma Sister    Spina bifida Sister    Hypothyroidism Mother    Hypertension Maternal Grandmother    Hyperlipidemia Maternal Grandmother    Lung disease Maternal Grandmother    Lung cancer Maternal Grandmother 18   Hypothyroidism Maternal Grandfather    Prostate cancer Maternal Grandfather    Cancer Maternal Grandfather        lung, stomach, and prostate cancers   Emphysema Paternal Grandmother    Diabetes Maternal Uncle    Diabetes Maternal Uncle    Leukemia Maternal Uncle 70   Prostate cancer Maternal Uncle        dx in his 60s/70s, metastatic   Breast cancer Other 13       negative genetic testing; maternal first cousin   Stroke Neg Hx    Colon cancer Neg Hx    The patient's father died at age 54 from prostate cancer.  The patient's mother is 75 years old as of September 2021.  The patient has 1 uncle with pancreatic cancer and one cousin on the same side of the family with breast cancer.   GYNECOLOGIC HISTORY:  No LMP recorded. (Menstrual status: Other). Menarche: 51 years old Age at first live birth: 51 years old [place] P 2 Contraceptive Mirena in place Hysterectomy?  No BSO?  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  She is divorced.  She lives alone although her significant other Larita Fife (who is disabled secondary to COPD) sometimes comes over.  Her sons are Aileen Pilot (24), who is studying dentistry at [employer] (21), who is also at [employer] considering dentistry or physical therapy.   ADVANCED DIRECTIVES: Not in place.  The patient intends to name her older son as her healthcare power of attorney and the appropriate documents were giving her on 01/29/2020 for her to complete and notarize at her discretion   HEALTH MAINTENANCE: Social History   Tobacco Use   Smoking status: Never   Smokeless tobacco: Never  Vaping Use   Vaping Use: Never used  Substance Use Topics   Alcohol use: Yes    Alcohol/week: 0.0 standard drinks    Comment: Very rarely.   Drug use: No     Colonoscopy:   PAP: Not up-to-date  Bone density: Never   Allergies  Allergen Reactions   Codeine Nausea And Vomiting   Zoloft [Sertraline Hcl]     headaches    Current Outpatient Medications  Medication Sig Dispense Refill   acyclovir ointment (ZOVIRAX) 5 % Apply 1 application topically every 3 (three) hours. As needed. 5 g 5   cholecalciferol (VITAMIN D3) 25  MCG (1000 UNIT) tablet Take 1,000 Units by mouth daily.     clotrimazole-betamethasone (LOTRISONE) cream Apply externally BID prn sx up to 2 wks 15 g 0   cyclobenzaprine (FLEXERIL) 10 MG tablet TAKE 1/2 TO 1 TABLET BY MOUTH EVERY DAY AS NEEDED 30 tablet 5   escitalopram (LEXAPRO) 10 MG tablet Take 1 tablet (10 mg total) by mouth daily. 90 tablet 3   gabapentin (NEURONTIN) 100 MG capsule Take 1 capsule (100 mg total) by mouth at bedtime as needed.     goserelin (ZOLADEX) 3.6 MG injection Inject 3.6 mg into the skin every 28 (twenty-eight) days.     ibuprofen (ADVIL,MOTRIN) 200 MG tablet Take 200 mg by mouth every 6 (six) hours as needed.     levothyroxine (SYNTHROID) 25 MCG tablet TAKE 1 TABLET BY MOUTH EVERY DAY BEFORE BREAKFAST 30 tablet 2   meclizine (ANTIVERT) 25 MG tablet Take 0.5-1 tablets (12.5-25 mg total) by mouth 3 (three) times daily as needed for dizziness (sedation caution). 30 tablet 1   metFORMIN  (GLUCOPHAGE) 500 MG tablet 2 tabs a day. 180 tablet 1   tamoxifen (NOLVADEX) 20 MG tablet Take 1 tablet (20 mg total) by mouth daily. 90 tablet 4   Turmeric (QC TUMERIC COMPLEX PO) Take by mouth.     valACYclovir (VALTREX) 1000 MG tablet Take 2 tablets (2,000 mg total) by mouth 2 (two) times daily as needed. For 1 day. 20 tablet 5   No current facility-administered medications for this visit.    OBJECTIVE: White woman who appears well  Vitals:   06/24/21 1439  BP: 140/78  Pulse: 86  Resp: 16  Temp: 98.1 F (36.7 C)  SpO2: 97%     Body mass index is 32.13 kg/m.   Wt Readings from Last 3 Encounters:  06/24/21 181 lb 6.4 oz (82.3 kg)  06/08/21 180 lb (81.6 kg)  05/04/21 182 lb 4.8 oz (82.7 kg)     ECOG FS:1 - Symptomatic but completely ambulatory  Sclerae unicteric, EOMs intact Wearing a mask No cervical or supraclavicular adenopathy Lungs no rales or rhonchi Heart regular rate and rhythm Abd soft, nontender, positive bowel sounds MSK no focal spinal tenderness, no upper extremity lymphedema Neuro: nonfocal, well oriented, appropriate affect Breasts: The right breast is status postmastectomy and reconstruction.  No concerns for recurrence.  Left breast is benign.  Both axillae are benign.   LAB RESULTS:  CMP     Component Value Date/Time   NA 139 05/04/2021 1404   K 3.7 05/04/2021 1404   CL 105 05/04/2021 1404   CO2 24 05/04/2021 1404   GLUCOSE 220 (H) 05/04/2021 1404   BUN 14 05/04/2021 1404   CREATININE 0.73 05/04/2021 1404   CREATININE 0.60 09/15/2020 1431   CALCIUM 9.3 05/04/2021 1404   PROT 7.3 05/04/2021 1404   ALBUMIN 4.0 05/04/2021 1404   AST 48 (H) 05/04/2021 1404   AST 37 09/15/2020 1431   ALT 33 05/04/2021 1404   ALT 32 09/15/2020 1431   ALKPHOS 66 05/04/2021 1404   BILITOT 0.5 05/04/2021 1404   BILITOT 0.4 09/15/2020 1431   GFRNONAA >60 05/04/2021 1404   GFRNONAA >60 09/15/2020 1431   GFRAA >60 01/29/2020 1526    No results found for:  TOTALPROTELP, ALBUMINELP, A1GS, A2GS, BETS, BETA2SER, GAMS, MSPIKE, SPEI  Lab Results  Component Value Date   WBC 8.9 06/24/2021   NEUTROABS 5.3 06/24/2021   HGB 13.7 06/24/2021   HCT 40.3 06/24/2021   MCV 88.6 06/24/2021  PLT 231 06/24/2021    No results found for: LABCA2  No components found for: TMLYYT035  No results for input(s): INR in the last 168 hours.  No results found for: LABCA2  No results found for: WSF681  No results found for: EXN170  No results found for: YFV494  No results found for: CA2729  No components found for: HGQUANT  No results found for: CEA1 / No results found for: CEA1   No results found for: AFPTUMOR  No results found for: CHROMOGRNA  No results found for: KPAFRELGTCHN, LAMBDASER, KAPLAMBRATIO (kappa/lambda light chains)  No results found for: HGBA, HGBA2QUANT, HGBFQUANT, HGBSQUAN (Hemoglobinopathy evaluation)   No results found for: LDH  No results found for: IRON, TIBC, IRONPCTSAT (Iron and TIBC)  No results found for: FERRITIN  Urinalysis    Component Value Date/Time   BILIRUBINUR negative 02/25/2015 1645   PROTEINUR + 0.15 02/25/2015 1645   UROBILINOGEN 0.2 02/25/2015 1645   NITRITE negative 02/25/2015 1645   LEUKOCYTESUR small (1+) (A) 02/25/2015 1645    STUDIES: MM 3D SCREEN BREAST UNI LEFT  Result Date: 06/21/2021 CLINICAL DATA:  Screening. EXAM: DIGITAL SCREENING UNILATERAL LEFT MAMMOGRAM WITH CAD AND TOMOSYNTHESIS TECHNIQUE: Left screening digital craniocaudal and mediolateral oblique mammograms were obtained. Left screening digital breast tomosynthesis was performed. The images were evaluated with computer-aided detection. COMPARISON:  Previous exam(s). ACR Breast Density Category b: There are scattered areas of fibroglandular density. FINDINGS: The patient has had a right mastectomy. There are no findings suspicious for malignancy. IMPRESSION: No mammographic evidence of malignancy. A result letter of this screening  mammogram will be mailed directly to the patient. RECOMMENDATION: Screening mammogram in one year.  (Code:[id number]-[id number]) BI-RADS CATEGORY  1: Negative. Electronically Signed   By: Dorise Bullion III M.D.   On: 06/21/2021 16:57     ELIGIBLE FOR AVAILABLE RESEARCH PROTOCOL: AET  ASSESSMENT:   51 y.o. [place] woman status post right breast lower outer quadrant biopsy 01/17/2020  for a clinical T1c N1, stage IB invasive mammary carcinoma, grade 2, estrogen and progesterone receptor positive, HER-2 not amplified  (a) biopsy of the left axillary "lymph node" was positive but included no lymph node tissue  (b) biopsy of a suspicious periareoral area was positive for ductal carcinoma in situ, grade 1  (c) biopsy of a left breast LOQ mass noted on 02/06/2020   (d) CT chest and bone scan 02/11/2020 show no evidence of metastatic disease  (1) genetics testing 02/15/2020 through the [facility] Common Hereditary Cancers Panel.  Found no deleterious mutations in APC, ATM, AXIN2, BARD1, BMPR1A, BRCA1, BRCA2, BRIP1, CDH1, CDK4, CDKN2A (p14ARF), CDKN2A (p16INK4a), CHEK2, CTNNA1, DICER1, EPCAM (Deletion/duplication testing only), GREM1 (promoter region deletion/duplication testing only), KIT, MEN1, MLH1, MSH2, MSH3, MSH6, MUTYH, NBN, NF1, NHTL1, PALB2, PDGFRA, PMS2, POLD1, POLE, PTEN, RAD50, RAD51C, RAD51D, RNF43, SDHB, SDHC, SDHD, SMAD4, SMARCA4. STK11, TP53, TSC1, TSC2, and VHL.  The following genes were evaluated for sequence changes only: SDHA and HOXB13 c.251G>A variant only.   (2) MammaPrint obtained from the 01/17/2020 biopsy read as "low risk" predicting a 93% chance of distant disease free survival at 5 years without the need for chemotherapy  (3) tamoxifen started 02/13/2020 in anticipation of surgical delays  (a) started ovarian suppression 04/30/2020 with monthly goserelin/Zoladex  (4) status post right modified radical mastectomy 03/30/2020 for a pT3 pN1, stage IIA invasive ductal carcinoma, grade 2,  with evidence of lymphovascular space involvement but negative margins  (a) a total of 9 right axillary lymph nodes removed,  2 positive with focal extracapsular extension  (b) status post right breast reconstruction 05/28/2020 with a [place] Smooth Round Ultra High Profile Gel 650 cc. Ref #[id number].  Serial Number [id number]; left mastopexy same date  (5) adjuvant radiation completed 08/25/2020   PLAN:  She is tolerating the goserelin and tamoxifen well and the plan is to continue that for minimum of 5 years. She is scheduled for some implant surgery next month and she may have to reschedule zoladex injection by a couple days, this should be ok. With regards to her menopausal status, since she is on zoladex, we cannot quite comment on it.  In a yr or so, we can discontinue zoladex for a couple months and check her [place] and estradiol. If she is post menopausal, she doesn't need zoladex. She will RTC in 3/4 months for FU Continue annual mammogram of left breast,next due Feb 2024.  Total encounter time 30 minutes.**Total Encounter Time as defined by the Centers for Medicare and Medicaid Services includes, in addition to the face-to-face time of a patient visit (documented in the note above) non-face-to-face time: obtaining and reviewing outside history, ordering and reviewing medications, tests or procedures, care coordination (communications with other health care professionals or caregivers) and documentation in the medical record.

## 2021-06-24 NOTE — Progress Notes (Deleted)
[place]  [phone] [phone]     ID: Christy Ferrell DOB: [birth year]  MR#: [id number]  QMG#:[id number]  Patient Care Team: [name], MD as PCP - General (Family Medicine) Mauro Kaufmann, RN as Oncology Nurse Navigator Rockwell Germany, RN as Oncology Nurse Navigator Magrinat, Virgie Dad, MD (Inactive) as Consulting Physician (Oncology) Jovita Kussmaul, MD as Consulting Physician (General Surgery) Dillingham, Loel Lofty, DO as Attending Physician (Plastic Surgery) Benay Pike, MD OTHER MD:  CHIEF COMPLAINT: Estrogen receptor positive breast cancer (s/p right mastectomy)  CURRENT TREATMENT: tamoxifen, goserelin   INTERVAL HISTORY: Christy "Christy Ferrell" returns today for follow up of her estrogen receptor positive breast cancer.   She started tamoxifen on 02/13/2020.  She was having significant night sweats and we started gabapentin but at 300 mg nightly that was too much and made her woozy in the morning.  Since her last visit, she underwent breast MRI on 09/20/2020 showing: breast composition B; previously seen 0.5 cm enhancing mass in lower-inner left breast no longer identified; no evidence of malignancy in reconstructed right breast.  She receives goserelin monthly, with a dose due today.  She tolerates this remarkably well.  She is overdue for annual left screening mammography.   REVIEW OF SYSTEMS: Christy Ferrell tells me she tends to have frequent vaginal yeast infections.  She is being treated with Lotrisone and occasionally Diflucan.  She exercises by walking mostly at work.  She does not count her steps.  She tells me she is going to need some further plastic work in the right reconstructed breast.  A detailed review of systems today was otherwise stable.   COVID 19 VACCINATION STATUS: Not vaccinated as of December 2022   HISTORY OF CURRENT ILLNESS: From the original visit note:  MAIZIE GARNO herself palpated a right breast mass. She  underwent bilateral diagnostic mammography with tomography and right breast ultrasonography at [facility] on 01/15/2020 showing: breast density category B; palpable 1.6 cm mass in right breast at 5:30, with adjacent 5 mm probable satellite mass; 0.9 cm mass in retroareolar right breast, also at 5:30; two enlarged lymph nodes in right breast at 9:30.  Accordingly on 01/17/2020 she proceeded to biopsy of the right breast areas in question. The pathology from this procedure ([id number]) showed:  1. Right Breast, 5:30  - invasive mammary carcinoma, grade 2  - ductal carcinoma in situ, low grade.   - Prognostic indicators significant for: estrogen receptor, 95% positive and progesterone receptor, 95% positive, both with strong staining intensity. Proliferation marker Ki67 not obtained. HER2 negative by immunohistochemistry (0). 2. Right Breast, retroareolar  - ductal carcinoma in situ 3. Lymph Node, right axilla  - invasive mammary carcinoma  - lymph node tissue not identified  We do not have an e-cadherin or Ki67 on this tissue.  The patient's subsequent history is as detailed below.   PAST MEDICAL HISTORY: Past Medical History:  Diagnosis Date   Anxiety    h/o, was prev on zoloft   Breast cancer ([facility]) 01/2020   seeing Dr. Marlou Starks   Cholelithiasis    Diabetes mellitus without complication [facility])    Family history of breast cancer    Family history of leukemia    Family history of lung cancer    Family history of prostate cancer    Family history of stomach cancer    Hypothyroidism    postpartum hypothyroidism   IUD (intrauterine device) in place    mirena per  [facility]   Major depression     PAST SURGICAL HISTORY: Past Surgical History:  Procedure Laterality Date   BREAST RECONSTRUCTION WITH PLACEMENT OF TISSUE EXPANDER AND FLEX HD (ACELLULAR HYDRATED DERMIS) Right 03/30/2020   Procedure: BREAST RECONSTRUCTION WITH PLACEMENT OF TISSUE EXPANDER AND FLEX HD (ACELLULAR  HYDRATED DERMIS);  Surgeon: Peggye Form, DO;  Location: [facility];  Service: Plastics;  Laterality: Right;   BREAST REDUCTION WITH MASTOPEXY Left 05/28/2020   Procedure: BREAST REDUCTION WITH MASTOPEXY;  Surgeon: Peggye Form, DO;  Location: [facility];  Service: Plastics;  Laterality: Left;   CESAREAN SECTION     X 2 Fetal stress, second scheduled   CHOLECYSTECTOMY  05/1998   MASTECTOMY MODIFIED RADICAL Right 03/30/2020   Procedure: RIGHT MASTECTOMY MODIFIED RADICAL;  Surgeon: Griselda Miner, MD;  Location: [facility];  Service: General;  Laterality: Right;  PEC BLOCK, RNFA   REMOVAL OF TISSUE EXPANDER AND PLACEMENT OF IMPLANT Right 05/28/2020   Procedure: REMOVAL OF TISSUE EXPANDER AND PLACEMENT OF IMPLANT;  Surgeon: Peggye Form, DO;  Location: [facility];  Service: Plastics;  Laterality: Right;  2.5 hours, please    FAMILY HISTORY: Family History  Problem Relation Age of Onset   Hypertension Father    Depression Father    Prostate cancer Father 38       Prostate, S/P prostatectomy, metastatic   Asthma Sister    Spina bifida Sister    Hypothyroidism Mother    Hypertension Maternal Grandmother    Hyperlipidemia Maternal Grandmother    Lung disease Maternal Grandmother    Lung cancer Maternal Grandmother 85   Hypothyroidism Maternal Grandfather    Prostate cancer Maternal Grandfather    Cancer Maternal Grandfather        lung, stomach, and prostate cancers   Emphysema Paternal Grandmother    Diabetes Maternal Uncle    Diabetes Maternal Uncle    Leukemia Maternal Uncle 72   Prostate cancer Maternal Uncle        dx in his 60s/70s, metastatic   Breast cancer Other 45       negative genetic testing; maternal first cousin   Stroke Neg Hx    Colon cancer Neg Hx    The patient's father died at age 34 from prostate cancer.  The patient's mother is 22 years old as of September 2021.  The patient has 1  uncle with pancreatic cancer and one cousin on the same side of the family with breast cancer.   GYNECOLOGIC HISTORY:  No LMP recorded. (Menstrual status: Other). Menarche: 51 years old Age at first live birth: 51 years old GX P 2 Contraceptive Mirena in place Hysterectomy?  No BSO?  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  She is divorced.  She lives alone although her significant other Aris Everts (who is disabled secondary to COPD) sometimes comes over.  Her sons are Gay Filler (24), who is studying dentistry at [employer], and Webster (21), who is also at [employer] considering dentistry or physical therapy.   ADVANCED DIRECTIVES: Not in place.  The patient intends to name her older son as her healthcare power of attorney and the appropriate documents were giving her on 01/29/2020 for her to complete and notarize at her discretion   HEALTH MAINTENANCE: Social History   Tobacco Use   Smoking status: Never  Smokeless tobacco: Never  Vaping Use   Vaping Use: Never used  Substance Use Topics   Alcohol use: Yes    Alcohol/week: 0.0 standard drinks    Comment: Very rarely.   Drug use: No     Colonoscopy:   PAP: Not up-to-date  Bone density: Never   Allergies  Allergen Reactions   Codeine Nausea And Vomiting   Zoloft [Sertraline Hcl]     headaches    Current Outpatient Medications  Medication Sig Dispense Refill   acyclovir ointment (ZOVIRAX) 5 % Apply 1 application topically every 3 (three) hours. As needed. 5 g 5   cholecalciferol (VITAMIN D3) 25 MCG (1000 UNIT) tablet Take 1,000 Units by mouth daily.     clotrimazole-betamethasone (LOTRISONE) cream Apply externally BID prn sx up to 2 wks 15 g 0   cyclobenzaprine (FLEXERIL) 10 MG tablet TAKE 1/2 TO 1 TABLET BY MOUTH EVERY DAY AS NEEDED 30 tablet 5   escitalopram (LEXAPRO) 10 MG tablet Take 1 tablet (10 mg total) by mouth  daily. 90 tablet 3   gabapentin (NEURONTIN) 100 MG capsule Take 1 capsule (100 mg total) by mouth at bedtime as needed.     goserelin (ZOLADEX) 3.6 MG injection Inject 3.6 mg into the skin every 28 (twenty-eight) days.     ibuprofen (ADVIL,MOTRIN) 200 MG tablet Take 200 mg by mouth every 6 (six) hours as needed.     levothyroxine (SYNTHROID) 25 MCG tablet TAKE 1 TABLET BY MOUTH EVERY DAY BEFORE BREAKFAST 30 tablet 2   meclizine (ANTIVERT) 25 MG tablet Take 0.5-1 tablets (12.5-25 mg total) by mouth 3 (three) times daily as needed for dizziness (sedation caution). 30 tablet 1   metFORMIN (GLUCOPHAGE) 500 MG tablet 2 tabs a day. 180 tablet 1   tamoxifen (NOLVADEX) 20 MG tablet Take 1 tablet (20 mg total) by mouth daily. 90 tablet 4   Turmeric (QC TUMERIC COMPLEX PO) Take by mouth.     valACYclovir (VALTREX) 1000 MG tablet Take 2 tablets (2,000 mg total) by mouth 2 (two) times daily as needed. For 1 day. 20 tablet 5   No current facility-administered medications for this visit.    OBJECTIVE: White woman who appears well  Vitals:   06/24/21 1439  BP: 140/78  Pulse: 86  Resp: 16  Temp: 98.1 F (36.7 C)  SpO2: 97%     Body mass index is 32.13 kg/m.   Wt Readings from Last 3 Encounters:  06/24/21 181 lb 6.4 oz (82.3 kg)  06/08/21 180 lb (81.6 kg)  05/04/21 182 lb 4.8 oz (82.7 kg)     ECOG FS:1 - Symptomatic but completely ambulatory  Sclerae unicteric, EOMs intact Wearing a mask No cervical or supraclavicular adenopathy Lungs no rales or rhonchi Heart regular rate and rhythm Abd soft, nontender, positive bowel sounds MSK no focal spinal tenderness, no upper extremity lymphedema Neuro: nonfocal, well oriented, appropriate affect Breasts: The right breast is status postmastectomy and reconstruction.  There is a slight depression in the area where the nipple should be.  Otherwise this is very smooth and there is no evidence of local recurrence.  Left breast is benign.  Both axillae are  benign.   LAB RESULTS:  CMP     Component Value Date/Time   NA 139 05/04/2021 1404   K 3.7 05/04/2021 1404   CL 105 05/04/2021 1404   CO2 24 05/04/2021 1404   GLUCOSE 220 (H) 05/04/2021 1404   BUN 14 05/04/2021 1404   CREATININE 0.73  05/04/2021 1404   CREATININE 0.60 09/15/2020 1431   CALCIUM 9.3 05/04/2021 1404   PROT 7.3 05/04/2021 1404   ALBUMIN 4.0 05/04/2021 1404   AST 48 (H) 05/04/2021 1404   AST 37 09/15/2020 1431   ALT 33 05/04/2021 1404   ALT 32 09/15/2020 1431   ALKPHOS 66 05/04/2021 1404   BILITOT 0.5 05/04/2021 1404   BILITOT 0.4 09/15/2020 1431   GFRNONAA >60 05/04/2021 1404   GFRNONAA >60 09/15/2020 1431   GFRAA >60 01/29/2020 1526    No results found for: TOTALPROTELP, ALBUMINELP, A1GS, A2GS, BETS, BETA2SER, GAMS, MSPIKE, SPEI  Lab Results  Component Value Date   WBC 8.9 06/24/2021   NEUTROABS 5.3 06/24/2021   HGB 13.7 06/24/2021   HCT 40.3 06/24/2021   MCV 88.6 06/24/2021   PLT 231 06/24/2021    No results found for: LABCA2  No components found for: [id number]  No results for input(s): INR in the last 168 hours.  No results found for: LABCA2  No results found for: UPJ031  No results found for: RXY585  No results found for: FYT244  No results found for: CA2729  No components found for: HGQUANT  No results found for: CEA1 / No results found for: CEA1   No results found for: AFPTUMOR  No results found for: CHROMOGRNA  No results found for: KPAFRELGTCHN, LAMBDASER, KAPLAMBRATIO (kappa/lambda light chains)  No results found for: HGBA, HGBA2QUANT, HGBFQUANT, HGBSQUAN (Hemoglobinopathy evaluation)   No results found for: LDH  No results found for: IRON, TIBC, IRONPCTSAT (Iron and TIBC)  No results found for: FERRITIN  Urinalysis    Component Value Date/Time   BILIRUBINUR negative 02/25/2015 1645   PROTEINUR + 0.15 02/25/2015 1645   UROBILINOGEN 0.2 02/25/2015 1645   NITRITE negative 02/25/2015 1645   LEUKOCYTESUR small  (1+) (A) 02/25/2015 1645    STUDIES: MM 3D SCREEN BREAST UNI LEFT  Result Date: 06/21/2021 CLINICAL DATA:  Screening. EXAM: DIGITAL SCREENING UNILATERAL LEFT MAMMOGRAM WITH CAD AND TOMOSYNTHESIS TECHNIQUE: Left screening digital craniocaudal and mediolateral oblique mammograms were obtained. Left screening digital breast tomosynthesis was performed. The images were evaluated with computer-aided detection. COMPARISON:  Previous exam(s). ACR Breast Density Category b: There are scattered areas of fibroglandular density. FINDINGS: The patient has had a right mastectomy. There are no findings suspicious for malignancy. IMPRESSION: No mammographic evidence of malignancy. A result letter of this screening mammogram will be mailed directly to the patient. RECOMMENDATION: Screening mammogram in one year.  (Code:[id number]-L-71M) BI-RADS CATEGORY  1: Negative. Electronically Signed   By: Dorise Bullion III M.D.   On: 06/21/2021 16:57     ELIGIBLE FOR AVAILABLE RESEARCH PROTOCOL: AET  ASSESSMENT: 51 y.o. [place] woman status post right breast lower outer quadrant biopsy 01/17/2020  for a clinical T1c N1, stage IB invasive mammary carcinoma, grade 2, estrogen and progesterone receptor positive, HER-2 not amplified  (a) biopsy of the left axillary "lymph node" was positive but included no lymph node tissue  (b) biopsy of a suspicious periareoral area was positive for ductal carcinoma in situ, grade 1  (c) biopsy of a left breast LOQ mass noted on 02/06/2020 breast MRI pending  (d) CT chest and bone scan 02/11/2020 show no evidence of metastatic disease  (1) genetics testing 02/15/2020 through the [facility] Common Hereditary Cancers Panel.  Found no deleterious mutations in APC, ATM, AXIN2, BARD1, BMPR1A, BRCA1, BRCA2, BRIP1, CDH1, CDK4, CDKN2A (p14ARF), CDKN2A (p16INK4a), CHEK2, CTNNA1, DICER1, EPCAM (Deletion/duplication testing only), GREM1 (promoter region deletion/duplication testing only),  KIT, MEN1, MLH1,  MSH2, MSH3, MSH6, MUTYH, NBN, NF1, NHTL1, PALB2, PDGFRA, PMS2, POLD1, POLE, PTEN, RAD50, RAD51C, RAD51D, RNF43, SDHB, SDHC, SDHD, SMAD4, SMARCA4. STK11, TP53, TSC1, TSC2, and VHL.  The following genes were evaluated for sequence changes only: SDHA and HOXB13 c.251G>A variant only.   (2) MammaPrint obtained from the 01/17/2020 biopsy read as "low risk" predicting a 93% chance of distant disease free survival at 5 years without the need for chemotherapy  (3) tamoxifen started 02/13/2020 in anticipation of surgical delays  (a) started ovarian suppression 04/30/2020 with monthly goserelin/Zoladex  (4) status post right modified radical mastectomy 03/30/2020 for a pT3 pN1, stage IIA invasive ductal carcinoma, grade 2, with evidence of lymphovascular space involvement but negative margins  (a) a total of 9 right axillary lymph nodes removed, 2 positive with focal extracapsular extension  (b) status post right breast reconstruction 05/28/2020 with a [place] Smooth Round Ultra High Profile Gel 650 cc. Ref #[id number].  Serial Number [id number]; left mastopexy same date  (5) adjuvant radiation completed 08/25/2020    PLAN: Layken is now just over a 1 year out from definitive surgery for her breast cancer with no evidence of disease activity.  This is very favorable.  She is tolerating the goserelin and tamoxifen well and the plan is to continue that for minimum of 5 years.  She tells me she will have some additional touchup surgery to the right breast, like fat grafting, sometime in the coming year.  I have suggested she exercise a little bit more regularly and a little bit longer.  She does have an elliptical at home but it is in the garage.  She knows to call for any other issues that may develop before the next visit.  Total encounter time 25 minutes.Sarajane Jews C. Magrinat, MD 06/24/2021 3:00 PM Medical Oncology and Hematology [facility] [place], [place]  [address] Tel. [phone]    Fax. [phone]   This document serves as a record of services personally performed by Lurline Del, MD. It was created on his behalf by Wilburn Mylar, a trained medical scribe. The creation of this record is based on the scribe's personal observations and the provider's statements to them.   I, Lurline Del MD, have reviewed the above documentation for accuracy and completeness, and I agree with the above.   *Total Encounter Time as defined by the Centers for Medicare and Medicaid Services includes, in addition to the face-to-face time of a patient visit (documented in the note above) non-face-to-face time: obtaining and reviewing outside history, ordering and reviewing medications, tests or procedures, care coordination (communications with other health care professionals or caregivers) and documentation in the medical record.

## 2021-06-29 ENCOUNTER — Encounter: Payer: Self-pay | Admitting: Plastic Surgery

## 2021-06-29 ENCOUNTER — Other Ambulatory Visit: Payer: Self-pay | Admitting: Family Medicine

## 2021-07-02 NOTE — Progress Notes (Signed)
Patient ID: Christy Ferrell, female    DOB: 1970-06-20, 51 y.o.   MRN: [id number]  Chief Complaint  Patient presents with   Pre-op Exam      ICD-10-CM   1. S/P breast reconstruction, right  Z98.890        History of Present Illness: Christy Ferrell is a 51 y.o.  female  with a history of right-sided mastectomy with reconstruction and left-sided oncoplastic reduction and mastopexy for symmetry.  She presents for preoperative evaluation for upcoming procedure, abdominal liposuction with Lipo filling right breast, scheduled for 07/21/2021 with Dr. Marla Roe.  The patient has not had problems with anesthesia.  Patient denies any personal or family history of blood clots or clotting disorder.  She is not on any blood thinners and does not smoke tobacco.  She does however have a history of right-sided breast cancer s/p mastectomy with reconstruction for which she is still being treated with maintenance medications.  She currently is on tamoxifen and Zoladex antiestrogen therapy.  Next Zoladex injection scheduled for 07/19/2021, injections are in the abdomen.  We will connect with her oncologist, Dr. Chryl Heck, about postponing the injection and holding the tamoxifen 2 weeks before and after surgery.  I cannot find her most recent A1c in the chart, but she believes that most recent was 7.3.  Patient also reports a mild tape dermatitis. She will hold her supplements and vitamins.   Summary of Previous Visit: Patient has a history of right-sided breast radiation as well as a 650 cc gel implant.  She was last seen here in clinic by Dr. Marla Roe on 03/23/2021.  At that time, she complained of asymmetry and volume loss in the central portion of her right breast where she was radiated.  Discussed Lipo filling and patient expressed that she would like to proceed.  Job: Office-based, Herbalist.  PMH Significant for: Right-sided mastectomy with reconstruction using 650 cc implant, left-sided mastopexy,  right-sided breast cancer on tamoxifen, thyroid disorder, DM on metformin.   Past Medical History: Allergies: Allergies  Allergen Reactions   Codeine Nausea And Vomiting   Zoloft [Sertraline Hcl]     headaches    Current Medications:  Current Outpatient Medications:    acyclovir ointment (ZOVIRAX) 5 %, Apply 1 application topically every 3 (three) hours. As needed., Disp: 5 g, Rfl: 5   cephALEXin (KEFLEX) 500 MG capsule, Take 1 capsule (500 mg total) by mouth 3 (three) times daily for 4 days., Disp: 12 capsule, Rfl: 0   cholecalciferol (VITAMIN D3) 25 MCG (1000 UNIT) tablet, Take 1,000 Units by mouth daily., Disp: , Rfl:    clotrimazole-betamethasone (LOTRISONE) cream, Apply externally BID prn sx up to 2 wks, Disp: 15 g, Rfl: 0   escitalopram (LEXAPRO) 10 MG tablet, Take 1 tablet (10 mg total) by mouth daily., Disp: 90 tablet, Rfl: 3   gabapentin (NEURONTIN) 100 MG capsule, Take 1 capsule (100 mg total) by mouth at bedtime as needed., Disp: , Rfl:    goserelin (ZOLADEX) 3.6 MG injection, Inject 3.6 mg into the skin every 28 (twenty-eight) days., Disp: , Rfl:    ibuprofen (ADVIL,MOTRIN) 200 MG tablet, Take 200 mg by mouth every 6 (six) hours as needed., Disp: , Rfl:    levothyroxine (SYNTHROID) 25 MCG tablet, TAKE 1 TABLET BY MOUTH EVERY DAY BEFORE BREAKFAST, Disp: 30 tablet, Rfl: 2   metFORMIN (GLUCOPHAGE) 500 MG tablet, 2 tabs a day., Disp: 180 tablet, Rfl: 1   ondansetron (ZOFRAN-ODT) 4 MG  disintegrating tablet, Take 1 tablet (4 mg total) by mouth every 8 (eight) hours as needed for nausea or vomiting., Disp: 20 tablet, Rfl: 0   tamoxifen (NOLVADEX) 20 MG tablet, Take 1 tablet (20 mg total) by mouth daily., Disp: 90 tablet, Rfl: 4   traMADol (ULTRAM) 50 MG tablet, Take 1 tablet (50 mg total) by mouth every 8 (eight) hours as needed for up to 7 days for severe pain., Disp: 10 tablet, Rfl: 0   cyclobenzaprine (FLEXERIL) 10 MG tablet, TAKE 1/2 TO 1 TABLET BY MOUTH EVERY DAY AS NEEDED  (Patient not taking: Reported on 07/07/2021), Disp: 30 tablet, Rfl: 5   meclizine (ANTIVERT) 25 MG tablet, Take 0.5-1 tablets (12.5-25 mg total) by mouth 3 (three) times daily as needed for dizziness (sedation caution). (Patient not taking: Reported on 07/07/2021), Disp: 30 tablet, Rfl: 1   Turmeric (QC TUMERIC COMPLEX PO), Take by mouth. (Patient not taking: Reported on 07/07/2021), Disp: , Rfl:    valACYclovir (VALTREX) 1000 MG tablet, Take 2 tablets (2,000 mg total) by mouth 2 (two) times daily as needed. For 1 day. (Patient not taking: Reported on 07/07/2021), Disp: 20 tablet, Rfl: 5  Past Medical Problems: Past Medical History:  Diagnosis Date   Anxiety    h/o, was prev on zoloft   Breast cancer ([place]) 01/2020   seeing Dr. Marlou Starks   Cholelithiasis    Diabetes mellitus without complication [facility])    Family history of breast cancer    Family history of leukemia    Family history of lung cancer    Family history of prostate cancer    Family history of stomach cancer    Hypothyroidism    postpartum hypothyroidism   IUD (intrauterine device) in place    mirena per [facility]   Major depression     Past Surgical History: Past Surgical History:  Procedure Laterality Date   BREAST RECONSTRUCTION WITH PLACEMENT OF TISSUE EXPANDER AND FLEX HD (ACELLULAR HYDRATED DERMIS) Right 03/30/2020   Procedure: BREAST RECONSTRUCTION WITH PLACEMENT OF TISSUE EXPANDER AND FLEX HD (ACELLULAR HYDRATED DERMIS);  Surgeon: Wallace Going, DO;  Location: [place];  Service: Plastics;  Laterality: Right;   BREAST REDUCTION WITH MASTOPEXY Left 05/28/2020   Procedure: BREAST REDUCTION WITH MASTOPEXY;  Surgeon: Wallace Going, DO;  Location: [place];  Service: Plastics;  Laterality: Left;   CESAREAN SECTION     X 2 Fetal stress, second scheduled   CHOLECYSTECTOMY  05/1998   MASTECTOMY MODIFIED RADICAL Right 03/30/2020   Procedure: RIGHT MASTECTOMY MODIFIED RADICAL;   Surgeon: Jovita Kussmaul, MD;  Location: [place];  Service: General;  Laterality: Right;  PEC BLOCK, RNFA   REMOVAL OF TISSUE EXPANDER AND PLACEMENT OF IMPLANT Right 05/28/2020   Procedure: REMOVAL OF TISSUE EXPANDER AND PLACEMENT OF IMPLANT;  Surgeon: Wallace Going, DO;  Location: [place];  Service: Plastics;  Laterality: Right;  2.5 hours, please    Social History: Social History   Socioeconomic History   Marital status: Divorced    Spouse name: Not on file   Number of children: Not on file   Years of education: Not on file   Highest education level: Not on file  Occupational History   Occupation: Housewife  Tobacco Use   Smoking status: Never   Smokeless tobacco: Never  Vaping Use   Vaping Use: Never used  Substance and Sexual Activity   Alcohol use: Yes    Alcohol/week:  0.0 standard drinks    Comment: Very rarely.   Drug use: No   Sexual activity: Yes    Birth control/protection: None  Other Topics Concern   Not on file  Social History Narrative   Married 1997, divorced 2017   In relationship as of 2021   2 kids   Social Determinants of Radio broadcast assistant Strain: Not on file  Food Insecurity: Not on file  Transportation Needs: Not on file  Physical Activity: Not on file  Stress: Not on file  Social Connections: Not on file  Intimate Partner Violence: Not on file    Family History: Family History  Problem Relation Age of Onset   Hypertension Father    Depression Father    Prostate cancer Father 29       Prostate, S/P prostatectomy, metastatic   Asthma Sister    Spina bifida Sister    Hypothyroidism Mother    Hypertension Maternal Grandmother    Hyperlipidemia Maternal Grandmother    Lung disease Maternal Grandmother    Lung cancer Maternal Grandmother 74   Hypothyroidism Maternal Grandfather    Prostate cancer Maternal Grandfather    Cancer Maternal Grandfather        lung, stomach, and prostate  cancers   Emphysema Paternal Grandmother    Diabetes Maternal Uncle    Diabetes Maternal Uncle    Leukemia Maternal Uncle 70   Prostate cancer Maternal Uncle        dx in his 60s/70s, metastatic   Breast cancer Other 62       negative genetic testing; maternal first cousin   Stroke Neg Hx    Colon cancer Neg Hx     Review of Systems: ROS Denies recent fevers, infection, or hospitalization.  Physical Exam: Vital Signs BP 132/85 (BP Location: Left Arm, Patient Position: Sitting, Cuff Size: Normal)    Pulse 72    Ht 5\' 3"  (1.6 m)    Wt 179 lb 12.8 oz (81.6 kg)    SpO2 97%    BMI 31.85 kg/m   Physical Exam Constitutional:      General: Not in acute distress.    Appearance: Normal appearance. Not ill-appearing.  HENT:     Head: Normocephalic and atraumatic.  Eyes:     Pupils: Pupils are equal, round. Cardiovascular:     Rate and Rhythm: Normal rate.    Pulses: Normal pulses.  Pulmonary:     Effort: No respiratory distress or increased work of breathing.  Speaks in full sentences. Abdominal:     General: Abdomen is flat. No distension.   Musculoskeletal: Normal range of motion. No lower extremity swelling or edema.  Scattered varicosities. Skin:    General: Skin is warm and dry.     Findings: No erythema or rash.  Neurological:     Mental Status: Alert and oriented to person, place, and time.  Psychiatric:        Mood and Affect: Mood normal.        Behavior: Behavior normal.    Assessment/Plan: The patient is scheduled for abdominal liposuction with right breast Lipo filling with Dr. Marla Roe.  Risks, benefits, and alternatives of procedure discussed, questions answered and consent obtained.    Smoking Status: Non-smoker. Last Mammogram: Left breast 06/2021; Results: BI-RADS Category 1, negative.  Caprini Score: 8, high; Risk Factors include: Age, BMI greater than 25, history of breast cancer, current tamoxifen, scattered varicosities, and length of planned surgery.  Recommendation for mechanical and possibly  pharmacological prophylaxis. Encourage early ambulation.  She reports that she did not require Lovenox with her previous surgeries.  Discussed with surgeon, mechanical prophylaxis is adequate.   Will message oncologist to see if tamoxifen can be held and next Zoladex injection post-posted so as to not increase patient's risk of thrombosis.  Pictures obtained: 03/2021  Post-op Rx sent to pharmacy: Keflex, Zofran, tramadol.  Patient was provided with the General Surgical Risk consent document and Pain Medication Agreement prior to their appointment.  They had adequate time to read through the risk consent documents and Pain Medication Agreement. We also discussed them in person together during this preop appointment. All of their questions were answered to their satisfaction.  Recommended calling if they have any further questions.  Risk consent form and Pain Medication Agreement to be scanned into patient's chart.  The risks that can be encountered with and after liposuction were discussed and include the following but no limited to these:  Asymmetry, fluid accumulation, firmness of the area, fat necrosis with death of fat tissue, bleeding, infection, delayed healing, anesthesia risks, skin sensation changes, injury to structures including nerves, blood vessels, and muscles which may be temporary or permanent, allergies to tape, suture materials and glues, blood products, topical preparations or injected agents, skin and contour irregularities, skin discoloration and swelling, deep vein thrombosis, cardiac and pulmonary complications, pain, which may persist, persistent pain, recurrence of the lesion, poor healing of the incision, possible need for revisional surgery or staged procedures. Thiere can also be persistent swelling, poor wound healing, rippling or loose skin, worsening of cellulite, swelling, and thermal burn or heat injury from ultrasound with the  ultrasound-assisted lipoplasty technique. Any change in weight fluctuations can alter the outcome.    Electronically signed by: Krista Blue, PA-C 07/07/2021 12:19 PM

## 2021-07-05 LAB — ESTRADIOL, ULTRA SENS: Estradiol, Sensitive: 6.8 pg/mL

## 2021-07-07 ENCOUNTER — Other Ambulatory Visit: Payer: Self-pay

## 2021-07-07 ENCOUNTER — Encounter: Payer: Self-pay | Admitting: Physician Assistant

## 2021-07-07 ENCOUNTER — Ambulatory Visit (INDEPENDENT_AMBULATORY_CARE_PROVIDER_SITE_OTHER): Payer: 59 | Admitting: Physician Assistant

## 2021-07-07 VITALS — BP 132/85 | HR 72 | Ht 63.0 in | Wt 179.8 lb

## 2021-07-07 DIAGNOSIS — Z9889 Other specified postprocedural states: Secondary | ICD-10-CM

## 2021-07-07 MED ORDER — CEPHALEXIN 500 MG PO CAPS: 500.0000 mg | ORAL_CAPSULE | Freq: Three times a day (TID) | ORAL | 0 refills | Status: AC

## 2021-07-07 MED ORDER — ONDANSETRON 4 MG PO TBDP: 4.0000 mg | ORAL_TABLET | Freq: Three times a day (TID) | ORAL | 0 refills | Status: DC | PRN

## 2021-07-07 MED ORDER — TRAMADOL HCL 50 MG PO TABS: 50.0000 mg | ORAL_TABLET | Freq: Three times a day (TID) | ORAL | 0 refills | Status: AC | PRN

## 2021-07-12 ENCOUNTER — Encounter (HOSPITAL_BASED_OUTPATIENT_CLINIC_OR_DEPARTMENT_OTHER): Payer: Self-pay | Admitting: Plastic Surgery

## 2021-07-12 ENCOUNTER — Telehealth: Payer: Self-pay

## 2021-07-12 ENCOUNTER — Other Ambulatory Visit: Payer: Self-pay

## 2021-07-12 NOTE — Telephone Encounter (Signed)
Patient is returning call from Krista Blue, PA-C, regarding medications she needs to stop before her surgery.

## 2021-07-13 ENCOUNTER — Encounter: Payer: Self-pay | Admitting: Physician Assistant

## 2021-07-13 NOTE — H&P (View-Only) (Signed)
I spoke with patient's oncologist, Dr. Chryl Heck, who gave permission for [place] to hold patient's tamoxifen two weeks before and after surgery.  Called and communicated this to patient.  They also may postpone her Zoladex injection that is currently scheduled two days ahead of her surgery given that she will be having abdominal liposuction.  She will call to verify with oncology team.

## 2021-07-13 NOTE — Telephone Encounter (Signed)
Spoke with patient.

## 2021-07-13 NOTE — Progress Notes (Signed)
I spoke with patient's oncologist, Dr. Chryl Heck, who gave permission for [place] to hold patient's tamoxifen two weeks before and after surgery.  Called and communicated this to patient.  They also may postpone her Zoladex injection that is currently scheduled two days ahead of her surgery given that she will be having abdominal liposuction.  She will call to verify with oncology team.

## 2021-07-14 ENCOUNTER — Telehealth: Payer: Self-pay | Admitting: *Deleted

## 2021-07-14 NOTE — Telephone Encounter (Signed)
Appointment for her zoladex on 07/19/2021 cancelled per pending abd surgery with liposuction for filling for breast symmetry. ? ?Pt understands to keep appt for April as scheduled unless surgeon advices differently post procedure. ? ?Pt verbalized understanding. ?

## 2021-07-16 ENCOUNTER — Ambulatory Visit: Payer: 59 | Admitting: Family

## 2021-07-16 ENCOUNTER — Other Ambulatory Visit: Payer: Self-pay

## 2021-07-16 ENCOUNTER — Encounter: Payer: Self-pay | Admitting: Hematology and Oncology

## 2021-07-16 VITALS — BP 120/68 | HR 57 | Temp 99.4°F | Ht 63.0 in | Wt 180.0 lb

## 2021-07-16 DIAGNOSIS — H109 Unspecified conjunctivitis: Secondary | ICD-10-CM | POA: Diagnosis not present

## 2021-07-16 DIAGNOSIS — H00014 Hordeolum externum left upper eyelid: Secondary | ICD-10-CM | POA: Diagnosis not present

## 2021-07-16 MED ORDER — OFLOXACIN 0.3 % OP SOLN: 1.0000 [drp] | Freq: Four times a day (QID) | OPHTHALMIC | 0 refills | Status: AC

## 2021-07-16 NOTE — Patient Instructions (Signed)
Eye drops were prescribed today and sent to your preferred pharmacy.  ?Start eye drop, and use as instructed.  ?When washing eye, wash from inner to outer canthus with warm wash cloth.  ?Wash bed sheets and pillow cases.  ?Throw out all old makeup, and try not to use makeup while duration of eye drops prescription.  ? ?Warm compresses to eye throughout the day.  ? ?It was a pleasure seeing you today! Please do not hesitate to reach out with any questions and or concerns. ? ?Regards,  ? ?Rolan Wrightsman ?FNP-C ? ? ?

## 2021-07-16 NOTE — Assessment & Plan Note (Signed)
rx for ofloxacin sent to pt pharmacy, take as prescribed.  Discussed how to wash eye appropriately with warm warm cloth from inner to outer canthus. Wash bed sheets and pillow cases to prevent re-infection. Frequent handwashing. If no improvement in 24-48 hours with eye drops as prescribed, please call office.   

## 2021-07-16 NOTE — Assessment & Plan Note (Signed)
Warm compresses to upper left eyelid to reduce swelling ?If no improvement x 1-2 weeks will need to eye eye specialist ?

## 2021-07-16 NOTE — Progress Notes (Signed)
Established Patient Office Visit  Subjective:  Patient ID: Christy Ferrell, female    DOB: 15-Oct-1970  Age: 51 y.o. MRN: [id number]  CC:  Chief Complaint  Patient presents with   Eye Problem    Pt stated--left eye lid is swollen, redness, itching--4 days    HPI Christy Ferrell is here today with concerns.   Left eye three days ago with swelling, tenderness and some redness in the left upper lib. This am woke up and more red, and crustiness matted on the eyelids.   No visual change, no gritty sensation in the eye. No pain with movement.   Past Medical History:  Diagnosis Date   Anxiety    h/o, was prev on zoloft   Breast cancer ([place]) 01/2020   seeing Dr. Marlou Starks   Cholelithiasis    Diabetes mellitus without complication [facility])    Family history of breast cancer    Family history of leukemia    Family history of lung cancer    Family history of prostate cancer    Family history of stomach cancer    Hypothyroidism    postpartum hypothyroidism   IUD (intrauterine device) in place    mirena per [facility]   Major depression     Past Surgical History:  Procedure Laterality Date   BREAST RECONSTRUCTION WITH PLACEMENT OF TISSUE EXPANDER AND FLEX HD (ACELLULAR HYDRATED DERMIS) Right 03/30/2020   Procedure: BREAST RECONSTRUCTION WITH PLACEMENT OF TISSUE EXPANDER AND FLEX HD (ACELLULAR HYDRATED DERMIS);  Surgeon: Wallace Going, DO;  Location: [place];  Service: Plastics;  Laterality: Right;   BREAST REDUCTION WITH MASTOPEXY Left 05/28/2020   Procedure: BREAST REDUCTION WITH MASTOPEXY;  Surgeon: Wallace Going, DO;  Location: [place];  Service: Plastics;  Laterality: Left;   CESAREAN SECTION     X 2 Fetal stress, second scheduled   CHOLECYSTECTOMY  05/1998   MASTECTOMY MODIFIED RADICAL Right 03/30/2020   Procedure: RIGHT MASTECTOMY MODIFIED RADICAL;  Surgeon: Jovita Kussmaul, MD;  Location: [place];  Service:  General;  Laterality: Right;  PEC BLOCK, RNFA   REMOVAL OF TISSUE EXPANDER AND PLACEMENT OF IMPLANT Right 05/28/2020   Procedure: REMOVAL OF TISSUE EXPANDER AND PLACEMENT OF IMPLANT;  Surgeon: Wallace Going, DO;  Location: [place];  Service: Plastics;  Laterality: Right;  2.5 hours, please    Family History  Problem Relation Age of Onset   Hypertension Father    Depression Father    Prostate cancer Father 18       Prostate, S/P prostatectomy, metastatic   Asthma Sister    Spina bifida Sister    Hypothyroidism Mother    Hypertension Maternal Grandmother    Hyperlipidemia Maternal Grandmother    Lung disease Maternal Grandmother    Lung cancer Maternal Grandmother 8   Hypothyroidism Maternal Grandfather    Prostate cancer Maternal Grandfather    Cancer Maternal Grandfather        lung, stomach, and prostate cancers   Emphysema Paternal Grandmother    Diabetes Maternal Uncle    Diabetes Maternal Uncle    Leukemia Maternal Uncle 70   Prostate cancer Maternal Uncle        dx in his 60s/70s, metastatic   Breast cancer Other 28       negative genetic testing; maternal first cousin   Stroke Neg Hx    Colon cancer Neg Hx     Social History  Socioeconomic History   Marital status: Divorced    Spouse name: Not on file   Number of children: Not on file   Years of education: Not on file   Highest education level: Not on file  Occupational History   Occupation: Housewife  Tobacco Use   Smoking status: Never   Smokeless tobacco: Never  Vaping Use   Vaping Use: Never used  Substance and Sexual Activity   Alcohol use: Yes    Alcohol/week: 0.0 standard drinks    Comment: Very rarely.   Drug use: No   Sexual activity: Yes    Birth control/protection: None  Other Topics Concern   Not on file  Social History Narrative   Married 1997, divorced 2017   In relationship as of 2021   2 kids   Social Determinants of Radio broadcast assistant Strain:  Not on file  Food Insecurity: Not on file  Transportation Needs: Not on file  Physical Activity: Not on file  Stress: Not on file  Social Connections: Not on file  Intimate Partner Violence: Not on file    Outpatient Medications Prior to Visit  Medication Sig Dispense Refill   cholecalciferol (VITAMIN D3) 25 MCG (1000 UNIT) tablet Take 1,000 Units by mouth daily.     clotrimazole-betamethasone (LOTRISONE) cream Apply externally BID prn sx up to 2 wks 15 g 0   cyclobenzaprine (FLEXERIL) 10 MG tablet TAKE 1/2 TO 1 TABLET BY MOUTH EVERY DAY AS NEEDED 30 tablet 5   escitalopram (LEXAPRO) 10 MG tablet Take 1 tablet (10 mg total) by mouth daily. 90 tablet 3   gabapentin (NEURONTIN) 100 MG capsule Take 1 capsule (100 mg total) by mouth at bedtime as needed.     ibuprofen (ADVIL,MOTRIN) 200 MG tablet Take 200 mg by mouth every 6 (six) hours as needed.     levothyroxine (SYNTHROID) 25 MCG tablet TAKE 1 TABLET BY MOUTH EVERY DAY BEFORE BREAKFAST 30 tablet 2   meclizine (ANTIVERT) 25 MG tablet Take 0.5-1 tablets (12.5-25 mg total) by mouth 3 (three) times daily as needed for dizziness (sedation caution). 30 tablet 1   metFORMIN (GLUCOPHAGE) 500 MG tablet 2 tabs a day. 180 tablet 1   ondansetron (ZOFRAN-ODT) 4 MG disintegrating tablet Take 1 tablet (4 mg total) by mouth every 8 (eight) hours as needed for nausea or vomiting. 20 tablet 0   Turmeric (QC TUMERIC COMPLEX PO) Take by mouth.     valACYclovir (VALTREX) 1000 MG tablet Take 2 tablets (2,000 mg total) by mouth 2 (two) times daily as needed. For 1 day. 20 tablet 5   acyclovir ointment (ZOVIRAX) 5 % Apply 1 application topically every 3 (three) hours. As needed. (Patient not taking: Reported on 07/16/2021) 5 g 5   goserelin (ZOLADEX) 3.6 MG injection Inject 3.6 mg into the skin every 28 (twenty-eight) days. (Patient not taking: Reported on 07/16/2021)     tamoxifen (NOLVADEX) 20 MG tablet Take 1 tablet (20 mg total) by mouth daily. (Patient not taking:  Reported on 07/16/2021) 90 tablet 4   No facility-administered medications prior to visit.    Allergies  Allergen Reactions   Codeine Nausea And Vomiting   Zoloft [Sertraline Hcl]     headaches    ROS Review of Systems  Constitutional:  Negative for chills and fever.  HENT:  Negative for congestion, ear pain, sinus pressure and sore throat.   Eyes:  Positive for discharge, redness and itching. Negative for photophobia, pain (crusting) and visual disturbance.  Respiratory:  Negative for cough, shortness of breath and wheezing.   Cardiovascular:  Negative for chest pain and palpitations.     Objective:    Physical Exam Constitutional:      General: She is not in acute distress.    Appearance: She is obese. She is not ill-appearing, toxic-appearing or diaphoretic.  HENT:     Head: Normocephalic.     Right Ear: Tympanic membrane normal.     Left Ear: Tympanic membrane normal.     Nose: Nose normal.     Mouth/Throat:     Mouth: Mucous membranes are moist.  Eyes:     General: Vision grossly intact. Gaze aligned appropriately. No visual field deficit.       Right eye: No foreign body, discharge or hordeolum.        Left eye: Discharge (crusting on upper lids, brown) and hordeolum (upper inner eyelid with tenderness, erythema) present.No foreign body.     Pupils: Pupils are equal, round, and reactive to light.  Musculoskeletal:     Cervical back: Normal range of motion.  Neurological:     Mental Status: She is alert.    BP 120/68    Pulse (!) 57    Temp 99.4 F (37.4 C)    Ht 5\' 3"  (1.6 m)    Wt 180 lb (81.6 kg)    SpO2 97%    BMI 31.89 kg/m  Wt Readings from Last 3 Encounters:  07/16/21 180 lb (81.6 kg)  07/07/21 179 lb 12.8 oz (81.6 kg)  06/24/21 181 lb 6.4 oz (82.3 kg)     Health Maintenance Due  Topic Date Due   COVID-19 Vaccine (1) Never done   OPHTHALMOLOGY EXAM  Never done   Zoster Vaccines- Shingrix (1 of 2) Never done   COLONOSCOPY (Pts 45-47yrs Insurance  coverage will need to be confirmed)  Never done    There are no preventive care reminders to display for this patient.  Lab Results  Component Value Date   TSH 5.27 03/19/2021   Lab Results  Component Value Date   WBC 8.9 06/24/2021   HGB 13.7 06/24/2021   HCT 40.3 06/24/2021   MCV 88.6 06/24/2021   PLT 231 06/24/2021   Lab Results  Component Value Date   NA 140 06/24/2021   K 3.7 06/24/2021   CO2 28 06/24/2021   GLUCOSE 190 (H) 06/24/2021   BUN 14 06/24/2021   CREATININE 0.70 06/24/2021   BILITOT 0.4 06/24/2021   ALKPHOS 63 06/24/2021   AST 48 (H) 06/24/2021   ALT 34 06/24/2021   PROT 7.3 06/24/2021   ALBUMIN 4.2 06/24/2021   CALCIUM 9.1 06/24/2021   ANIONGAP 8 06/24/2021   GFR 101.17 03/19/2021   Lab Results  Component Value Date   HGBA1C 7.8 (H) 03/19/2021      Assessment & Plan:   Problem List Items Addressed This Visit       Other   Hordeolum externum of left upper eyelid    Warm compresses to upper left eyelid to reduce swelling If no improvement x 1-2 weeks will need to eye eye specialist      Relevant Medications   ofloxacin (OCUFLOX) 0.3 % ophthalmic solution   Bacterial conjunctivitis of left eye - Primary    rx for ofloxacin sent to pt pharmacy, take as prescribed.  Discussed how to wash eye appropriately with warm warm cloth from inner to outer canthus. Wash bed sheets and pillow cases to prevent re-infection. Frequent handwashing.  If no improvement in 24-48 hours with eye drops as prescribed, please call office.        Relevant Medications   ofloxacin (OCUFLOX) 0.3 % ophthalmic solution    Meds ordered this encounter  Medications   ofloxacin (OCUFLOX) 0.3 % ophthalmic solution    Sig: Place 1 drop into the left eye 4 (four) times daily for 7 days.    Dispense:  1.4 mL    Refill:  0    Order Specific Question:   Supervising Provider    Answer:   BEDSOLE, AMY E [[id number]]    Follow-up: No follow-ups on file.    Eugenia Pancoast, FNP

## 2021-07-19 ENCOUNTER — Ambulatory Visit: Payer: 59

## 2021-07-19 ENCOUNTER — Encounter (HOSPITAL_BASED_OUTPATIENT_CLINIC_OR_DEPARTMENT_OTHER)
Admission: RE | Admit: 2021-07-19 | Discharge: 2021-07-19 | Disposition: A | Discharge status: Home / Self Care | Payer: 59 | Source: Ambulatory Visit | Attending: Plastic Surgery | Admitting: Plastic Surgery

## 2021-07-19 DIAGNOSIS — Z01812 Encounter for preprocedural laboratory examination: Secondary | ICD-10-CM | POA: Diagnosis present

## 2021-07-21 ENCOUNTER — Encounter (HOSPITAL_BASED_OUTPATIENT_CLINIC_OR_DEPARTMENT_OTHER)
Admission: RE | Disposition: A | Discharge status: Home / Self Care | Payer: Self-pay | Source: Ambulatory Visit | Attending: Plastic Surgery

## 2021-07-21 ENCOUNTER — Ambulatory Visit (HOSPITAL_BASED_OUTPATIENT_CLINIC_OR_DEPARTMENT_OTHER): Payer: 59 | Admitting: Anesthesiology

## 2021-07-21 ENCOUNTER — Ambulatory Visit (HOSPITAL_BASED_OUTPATIENT_CLINIC_OR_DEPARTMENT_OTHER)
Admission: RE | Admit: 2021-07-21 | Discharge: 2021-07-21 | Disposition: A | Discharge status: Home / Self Care | Payer: 59 | Source: Ambulatory Visit | Attending: Plastic Surgery | Admitting: Plastic Surgery

## 2021-07-21 ENCOUNTER — Other Ambulatory Visit: Payer: Self-pay

## 2021-07-21 ENCOUNTER — Encounter (HOSPITAL_BASED_OUTPATIENT_CLINIC_OR_DEPARTMENT_OTHER): Payer: Self-pay | Admitting: Plastic Surgery

## 2021-07-21 DIAGNOSIS — N651 Disproportion of reconstructed breast: Secondary | ICD-10-CM

## 2021-07-21 DIAGNOSIS — Z6831 Body mass index (BMI) 31.0-31.9, adult: Secondary | ICD-10-CM | POA: Insufficient documentation

## 2021-07-21 DIAGNOSIS — Z9011 Acquired absence of right breast and nipple: Secondary | ICD-10-CM | POA: Insufficient documentation

## 2021-07-21 DIAGNOSIS — Z923 Personal history of irradiation: Secondary | ICD-10-CM | POA: Insufficient documentation

## 2021-07-21 DIAGNOSIS — E669 Obesity, unspecified: Secondary | ICD-10-CM | POA: Insufficient documentation

## 2021-07-21 DIAGNOSIS — F419 Anxiety disorder, unspecified: Secondary | ICD-10-CM | POA: Insufficient documentation

## 2021-07-21 DIAGNOSIS — Z79810 Long term (current) use of selective estrogen receptor modulators (SERMs): Secondary | ICD-10-CM | POA: Insufficient documentation

## 2021-07-21 DIAGNOSIS — E039 Hypothyroidism, unspecified: Secondary | ICD-10-CM | POA: Insufficient documentation

## 2021-07-21 DIAGNOSIS — C50911 Malignant neoplasm of unspecified site of right female breast: Secondary | ICD-10-CM | POA: Insufficient documentation

## 2021-07-21 DIAGNOSIS — Z9889 Other specified postprocedural states: Secondary | ICD-10-CM | POA: Insufficient documentation

## 2021-07-21 DIAGNOSIS — C50511 Malignant neoplasm of lower-outer quadrant of right female breast: Secondary | ICD-10-CM | POA: Diagnosis not present

## 2021-07-21 DIAGNOSIS — E119 Type 2 diabetes mellitus without complications: Secondary | ICD-10-CM | POA: Insufficient documentation

## 2021-07-21 DIAGNOSIS — F32A Depression, unspecified: Secondary | ICD-10-CM | POA: Insufficient documentation

## 2021-07-21 HISTORY — PX: LIPOSUCTION WITH LIPOFILLING: SHX6436

## 2021-07-21 SURGERY — LIPOSUCTION, WITH FAT TRANSFER
Anesthesia: General | Site: Breast | Laterality: Right

## 2021-07-21 MED ORDER — DEXAMETHASONE SODIUM PHOSPHATE 10 MG/ML IJ SOLN
INTRAMUSCULAR | Status: AC
  Filled 2021-07-21: qty 1

## 2021-07-21 MED ORDER — CEFAZOLIN SODIUM-DEXTROSE 2-4 GM/100ML-% IV SOLN
INTRAVENOUS | Status: AC
  Filled 2021-07-21: qty 100

## 2021-07-21 MED ORDER — PHENYLEPHRINE 40 MCG/ML (10ML) SYRINGE FOR IV PUSH (FOR BLOOD PRESSURE SUPPORT)
PREFILLED_SYRINGE | INTRAVENOUS | Status: AC
  Filled 2021-07-21: qty 10

## 2021-07-21 MED ORDER — PHENYLEPHRINE HCL (PRESSORS) 10 MG/ML IV SOLN
INTRAVENOUS | Status: DC | PRN
  Administered 2021-07-21 (×2): 80 ug via INTRAVENOUS

## 2021-07-21 MED ORDER — FENTANYL CITRATE (PF) 100 MCG/2ML IJ SOLN: 25.0000 ug | INTRAMUSCULAR | Status: DC | PRN

## 2021-07-21 MED ORDER — KETOROLAC TROMETHAMINE 30 MG/ML IJ SOLN: 30.0000 mg | Freq: Once | INTRAMUSCULAR | Status: DC

## 2021-07-21 MED ORDER — ACETAMINOPHEN 10 MG/ML IV SOLN: 1000.0000 mg | Freq: Once | INTRAVENOUS | Status: DC | PRN

## 2021-07-21 MED ORDER — PROPOFOL 10 MG/ML IV BOLUS
INTRAVENOUS | Status: AC
  Filled 2021-07-21: qty 20

## 2021-07-21 MED ORDER — SODIUM CHLORIDE 0.9% FLUSH: 3.0000 mL | INTRAVENOUS | Status: DC | PRN

## 2021-07-21 MED ORDER — FENTANYL CITRATE (PF) 100 MCG/2ML IJ SOLN
INTRAMUSCULAR | Status: AC
  Filled 2021-07-21: qty 2

## 2021-07-21 MED ORDER — SODIUM CHLORIDE 0.9% FLUSH: 3.0000 mL | Freq: Two times a day (BID) | INTRAVENOUS | Status: DC

## 2021-07-21 MED ORDER — ACETAMINOPHEN 500 MG PO TABS: 1000.0000 mg | ORAL_TABLET | Freq: Once | ORAL | Status: DC

## 2021-07-21 MED ORDER — FENTANYL CITRATE (PF) 100 MCG/2ML IJ SOLN
INTRAMUSCULAR | Status: DC | PRN
Start: 2021-07-21 — End: 2021-07-21
  Administered 2021-07-21: 50 ug via INTRAVENOUS
  Administered 2021-07-21 (×2): 25 ug via INTRAVENOUS

## 2021-07-21 MED ORDER — ONDANSETRON HCL 4 MG/2ML IJ SOLN
INTRAMUSCULAR | Status: DC | PRN
  Administered 2021-07-21: 4 mg via INTRAVENOUS

## 2021-07-21 MED ORDER — MIDAZOLAM HCL 2 MG/2ML IJ SOLN
INTRAMUSCULAR | Status: AC
Start: 2021-07-21 — End: ?
  Filled 2021-07-21: qty 2

## 2021-07-21 MED ORDER — LIDOCAINE-EPINEPHRINE 1 %-1:100000 IJ SOLN
INTRAMUSCULAR | Status: DC | PRN
  Administered 2021-07-21: 7.5 mL via INTRAMUSCULAR

## 2021-07-21 MED ORDER — CEFAZOLIN SODIUM-DEXTROSE 2-4 GM/100ML-% IV SOLN
2.0000 g | INTRAVENOUS | Status: AC
  Administered 2021-07-21: 2 g via INTRAVENOUS

## 2021-07-21 MED ORDER — OXYCODONE HCL 5 MG PO TABS
5.0000 mg | ORAL_TABLET | Freq: Once | ORAL | Status: AC
  Administered 2021-07-21: 5 mg via ORAL

## 2021-07-21 MED ORDER — ACETAMINOPHEN 325 MG RE SUPP: 650.0000 mg | RECTAL | Status: DC | PRN

## 2021-07-21 MED ORDER — LIDOCAINE 2% (20 MG/ML) 5 ML SYRINGE
INTRAMUSCULAR | Status: AC
  Filled 2021-07-21: qty 5

## 2021-07-21 MED ORDER — LACTATED RINGERS IV SOLN: INTRAVENOUS | Status: DC

## 2021-07-21 MED ORDER — PROPOFOL 10 MG/ML IV BOLUS
INTRAVENOUS | Status: DC | PRN
  Administered 2021-07-21: 200 mg via INTRAVENOUS

## 2021-07-21 MED ORDER — AMISULPRIDE (ANTIEMETIC) 5 MG/2ML IV SOLN: 10.0000 mg | Freq: Once | INTRAVENOUS | Status: DC | PRN

## 2021-07-21 MED ORDER — DEXAMETHASONE SODIUM PHOSPHATE 10 MG/ML IJ SOLN
INTRAMUSCULAR | Status: DC | PRN
Start: 2021-07-21 — End: 2021-07-21
  Administered 2021-07-21: 4 mg via INTRAVENOUS

## 2021-07-21 MED ORDER — MIDAZOLAM HCL 5 MG/5ML IJ SOLN
INTRAMUSCULAR | Status: DC | PRN
  Administered 2021-07-21: 2 mg via INTRAVENOUS

## 2021-07-21 MED ORDER — LIDOCAINE HCL 1 % IJ SOLN
INTRAVENOUS | Status: DC | PRN
  Administered 2021-07-21: 700 mL

## 2021-07-21 MED ORDER — ACETAMINOPHEN 325 MG PO TABS: 650.0000 mg | ORAL_TABLET | ORAL | Status: DC | PRN

## 2021-07-21 MED ORDER — CHLORHEXIDINE GLUCONATE CLOTH 2 % EX PADS: 6.0000 | MEDICATED_PAD | Freq: Once | CUTANEOUS | Status: DC

## 2021-07-21 MED ORDER — ONDANSETRON HCL 4 MG/2ML IJ SOLN
INTRAMUSCULAR | Status: AC
  Filled 2021-07-21: qty 2

## 2021-07-21 MED ORDER — LIDOCAINE HCL (CARDIAC) PF 100 MG/5ML IV SOSY
PREFILLED_SYRINGE | INTRAVENOUS | Status: DC | PRN
  Administered 2021-07-21: 60 mg via INTRATRACHEAL

## 2021-07-21 MED ORDER — SODIUM CHLORIDE 0.9 % IV SOLN: 250.0000 mL | INTRAVENOUS | Status: DC | PRN

## 2021-07-21 MED ORDER — OXYCODONE HCL 5 MG PO TABS
ORAL_TABLET | ORAL | Status: AC
  Filled 2021-07-21: qty 1

## 2021-07-21 SURGICAL SUPPLY — 48 items
ADH SKN CLS APL DERMABOND .7 (GAUZE/BANDAGES/DRESSINGS) ×1
BINDER ABDOMINAL  9 SM 30-45 (SOFTGOODS)
BINDER ABDOMINAL 10 UNV 27-48 (MISCELLANEOUS) ×1 IMPLANT
BINDER ABDOMINAL 12 SM 30-45 (SOFTGOODS) IMPLANT
BINDER ABDOMINAL 9 SM 30-45 (SOFTGOODS) IMPLANT
BINDER BREAST LRG (GAUZE/BANDAGES/DRESSINGS) IMPLANT
BINDER BREAST MEDIUM (GAUZE/BANDAGES/DRESSINGS) IMPLANT
BINDER BREAST XLRG (GAUZE/BANDAGES/DRESSINGS) IMPLANT
BINDER BREAST XXLRG (GAUZE/BANDAGES/DRESSINGS) ×1 IMPLANT
BLADE HEX COATED 2.75 (ELECTRODE) IMPLANT
BLADE SURG 15 STRL LF DISP TIS (BLADE) ×1 IMPLANT
BLADE SURG 15 STRL SS (BLADE) ×2
COVER BACK TABLE 60X90IN (DRAPES) ×2 IMPLANT
COVER MAYO STAND STRL (DRAPES) ×2 IMPLANT
DERMABOND ADVANCED (GAUZE/BANDAGES/DRESSINGS) ×1
DERMABOND ADVANCED .7 DNX12 (GAUZE/BANDAGES/DRESSINGS) ×1 IMPLANT
DRAPE LAPAROSCOPIC ABDOMINAL (DRAPES) ×2 IMPLANT
DRSG PAD ABDOMINAL 8X10 ST (GAUZE/BANDAGES/DRESSINGS) ×4 IMPLANT
ELECT REM PT RETURN 9FT ADLT (ELECTROSURGICAL) ×2
ELECTRODE REM PT RTRN 9FT ADLT (ELECTROSURGICAL) ×1 IMPLANT
EXTRACTOR CANIST REVOLVE STRL (CANNISTER) ×2 IMPLANT
GLOVE SURG ENC MOIS LTX SZ6.5 (GLOVE) ×6 IMPLANT
GOWN STRL REUS W/ TWL LRG LVL3 (GOWN DISPOSABLE) ×2 IMPLANT
GOWN STRL REUS W/TWL LRG LVL3 (GOWN DISPOSABLE) ×4
IV LACTATED RINGERS 1000ML (IV SOLUTION) ×4 IMPLANT
LINER CANISTER 1000CC FLEX (MISCELLANEOUS) ×4 IMPLANT
NDL HYPO 25X1 1.5 SAFETY (NEEDLE) IMPLANT
NDL SAFETY ECLIPSE 18X1.5 (NEEDLE) ×1 IMPLANT
NEEDLE HYPO 18GX1.5 SHARP (NEEDLE) ×2
NEEDLE HYPO 25X1 1.5 SAFETY (NEEDLE) ×2 IMPLANT
PACK BASIN DAY SURGERY FS (CUSTOM PROCEDURE TRAY) ×2 IMPLANT
PAD ALCOHOL SWAB (MISCELLANEOUS) ×2 IMPLANT
PENCIL SMOKE EVACUATOR (MISCELLANEOUS) IMPLANT
SLEEVE SCD COMPRESS KNEE MED (STOCKING) ×2 IMPLANT
SPIKE FLUID TRANSFER (MISCELLANEOUS) IMPLANT
SPONGE T-LAP 18X18 ~~LOC~~+RFID (SPONGE) ×3 IMPLANT
SUT MNCRL AB 4-0 PS2 18 (SUTURE) ×1 IMPLANT
SUT MON AB 5-0 PS2 18 (SUTURE) ×4 IMPLANT
SYR 10ML LL (SYRINGE) ×8 IMPLANT
SYR 3ML 18GX1 1/2 (SYRINGE) IMPLANT
SYR 50ML LL SCALE MARK (SYRINGE) ×4 IMPLANT
SYR CONTROL 10ML LL (SYRINGE) ×2 IMPLANT
SYR TOOMEY 50ML (SYRINGE) ×4 IMPLANT
TOWEL GREEN STERILE FF (TOWEL DISPOSABLE) ×4 IMPLANT
TRAY DSU PREP LF (CUSTOM PROCEDURE TRAY) ×2 IMPLANT
TUBING INFILTRATION IT-10001 (TUBING) ×1 IMPLANT
TUBING SET GRADUATE ASPIR 12FT (MISCELLANEOUS) ×2 IMPLANT
UNDERPAD 30X36 HEAVY ABSORB (UNDERPADS AND DIAPERS) ×4 IMPLANT

## 2021-07-21 NOTE — Interval H&P Note (Signed)
History and Physical Interval Note: ? ?07/21/2021 ?9:35 AM ? ?Christy Ferrell  has presented today for surgery, with the diagnosis of Status post breast reconstruction.  The various methods of treatment have been discussed with the patient and family. After consideration of risks, benefits and other options for treatment, the patient has consented to  Procedure(s): ?LIPOSUCTION WITH LIPOFILLING OF RIGHT BREAST (Right) as a surgical intervention.  The patient's history has been reviewed, patient examined, no change in status, stable for surgery.  I have reviewed the patient's chart and labs.  Questions were answered to the patient's satisfaction.   ? ? ?[name] ? ? ?

## 2021-07-21 NOTE — Transfer of Care (Signed)
Immediate Anesthesia Transfer of Care Note ? ?Patient: Christy Ferrell ? ?Procedure(s) Performed: LIPOSUCTION WITH LIPOFILLING OF RIGHT BREAST (Right: Breast) ? ?Patient Location: PACU ? ?Anesthesia Type:General ? ?Level of Consciousness: awake ? ?Airway & Oxygen Therapy: Patient Spontanous Breathing and Patient connected to face mask oxygen ? ?Post-op Assessment: Report given to RN and Post -op Vital signs reviewed and stable ? ?Post vital signs: Reviewed and stable ? ?Last Vitals:  ?Vitals Value Taken Time  ?BP    ?Temp    ?Pulse 88 07/21/21 1102  ?Resp    ?SpO2 96 % 07/21/21 1102  ?Vitals shown include unvalidated device data. ? ?Last Pain:  ?Vitals:  ? 07/21/21 0806  ?TempSrc: Oral  ?PainSc: 0-No pain  ?   ? ?Patients Stated Pain Goal: 5 (07/21/21 4827) ? ?Complications: No notable events documented. ?

## 2021-07-21 NOTE — Anesthesia Procedure Notes (Signed)
Procedure Name: LMA Insertion ?Date/Time: 07/21/2021 9:44 AM ?Performed by: Glory Buff, CRNA ?Pre-anesthesia Checklist: Patient identified, Emergency Drugs available, Suction available and Patient being monitored ?Patient Re-evaluated:Patient Re-evaluated prior to induction ?Oxygen Delivery Method: Circle system utilized ?Preoxygenation: Pre-oxygenation with 100% oxygen ?Induction Type: IV induction ?LMA: LMA inserted ?LMA Size: 4.0 ?Number of attempts: 1 ?Placement Confirmation: positive ETCO2 ?Tube secured with: Tape ?Dental Injury: Teeth and Oropharynx as per pre-operative assessment  ? ? ? ? ?

## 2021-07-21 NOTE — Op Note (Signed)
DATE OF OPERATION: 07/21/2021 ? ?LOCATION: [facility] ? ?PREOPERATIVE DIAGNOSIS: Right breast asymmetry after reconstruction for cancer ? ?POSTOPERATIVE DIAGNOSIS: Same ? ?PROCEDURE: Fat grafting to right breast for symmetry ? ?SURGEON: Consandra Laske Sanger Rasul Decola, DO ? ?ASSISTANT: Roetta Sessions, PA ? ?EBL: 5 cc ? ?CONDITION: Stable ? ?COMPLICATIONS: None ? ?INDICATION: The patient, Christy Ferrell, is a 51 y.o. female born on 03-Apr-1971, is here for treatment of right breast asymmetry after a mastectomy for breast cancer.  She had an implant placed and has resulting asymmetry.  ? ?PROCEDURE DETAILS:  ?The patient was seen prior to surgery and marked.  The IV antibiotics were given. The patient was taken to the operating room and given a general anesthetic. A standard time out was performed and all information was confirmed by those in the room. SCDs were placed.   The patients abdomen and breasts were prepped and draped. Local was placed at the intended injection site of the lower lateral abdomen and lateral right breast at the inframammary fold.  Tumescent was placed in the abdomen and lateral right breast. The liposuction was then performed on the abdomen and 350 cc of adipose was harvested.  The adipose was prepared according to the manufactures guidelines with washing and draining the fluid.  The fat was then placed in 10 cc syringes.  Attention was turned to the breast.  The lateral right breast was liposuctioned to improve the contour.  The incisions were closed with the 5-0 Monocryl.  An incision was placed in the medical right breast.  The harvested adipose was placed in the medial superior and middle breast area.  A total of 200 cc was injected into the soft tissue.  The incision was closed with the 5-0 Monocryl.  Sterile dressings were applied.  The patient was allowed to wake up and taken to recovery room in stable condition at the end of the case. The family was notified at the end of the  case.  ? ?The advanced practice practitioner (APP) assisted throughout the case.  The APP was essential in retraction and counter traction when needed to make the case progress smoothly.  This retraction and assistance made it possible to see the tissue plans for the procedure.  The assistance was needed for blood control, tissue re-approximation and assisted with closure of the incision site. ? ?

## 2021-07-21 NOTE — Anesthesia Preprocedure Evaluation (Addendum)
Anesthesia Evaluation  ?Patient identified by MRN, date of birth, ID band ?Patient awake ? ? ? ?Reviewed: ?Allergy & Precautions, NPO status , Patient's Chart, lab work & pertinent test results ? ?Airway ?Mallampati: III ? ?TM Distance: >3 FB ?Neck ROM: Full ? ? ? Dental ?no notable dental hx. ? ?  ?Pulmonary ?neg pulmonary ROS,  ?  ?Pulmonary exam normal ?breath sounds clear to auscultation ? ? ? ? ? ? Cardiovascular ?negative cardio ROS ?Normal cardiovascular exam ?Rhythm:Regular Rate:Normal ? ?ECG: NSR, rate 70 ?  ?Neuro/Psych ? Headaches, PSYCHIATRIC DISORDERS Anxiety Depression   ? GI/Hepatic ?negative GI ROS, Neg liver ROS,   ?Endo/Other  ?diabetes (PRE)Hypothyroidism  ? Renal/GU ?negative Renal ROS  ? ?  ?Musculoskeletal ?negative musculoskeletal ROS ?(+)  ? Abdominal ?(+) + obese,   ?Peds ? Hematology ?negative hematology ROS ?(+)   ?Anesthesia Other Findings ?Status post breast reconstruction ? Reproductive/Obstetrics ? ?  ? ? ? ? ? ? ? ? ? ? ? ? ? ?  ?  ? ? ? ? ? ? ? ?Anesthesia Physical ?Anesthesia Plan ? ?ASA: 2 ? ?Anesthesia Plan: General  ? ?Post-op Pain Management:   ? ?Induction: Intravenous ? ?PONV Risk Score and Plan: 3 and Ondansetron, Dexamethasone, Midazolam and Treatment may vary due to age or medical condition ? ?Airway Management Planned: LMA ? ?Additional Equipment:  ? ?Intra-op Plan:  ? ?Post-operative Plan: Extubation in OR ? ?Informed Consent: I have reviewed the patients History and Physical, chart, labs and discussed the procedure including the risks, benefits and alternatives for the proposed anesthesia with the patient or authorized representative who has indicated his/her understanding and acceptance.  ? ? ? ?Dental advisory given ? ?Plan Discussed with: CRNA ? ?Anesthesia Plan Comments:   ? ? ? ? ? ?Anesthesia Quick Evaluation ? ?

## 2021-07-21 NOTE — Anesthesia Postprocedure Evaluation (Signed)
Anesthesia Post Note ? ?Patient: OCIE STANZIONE ? ?Procedure(s) Performed: LIPOSUCTION WITH LIPOFILLING OF RIGHT BREAST (Right: Breast) ? ?  ? ?Patient location during evaluation: PACU ?Anesthesia Type: General ?Level of consciousness: awake ?Pain management: pain level controlled ?Vital Signs Assessment: post-procedure vital signs reviewed and stable ?Respiratory status: spontaneous breathing, nonlabored ventilation, respiratory function stable and patient connected to nasal cannula oxygen ?Cardiovascular status: blood pressure returned to baseline and stable ?Postop Assessment: no apparent nausea or vomiting ?Anesthetic complications: no ? ? ?No notable events documented. ? ?Last Vitals:  ?Vitals:  ? 07/21/21 1145 07/21/21 1218  ?BP: 123/77 134/74  ?Pulse: 71 74  ?Resp: 14 18  ?Temp:  36.9 ?C  ?SpO2: 94% 94%  ?  ?Last Pain:  ?Vitals:  ? 07/21/21 1218  ?TempSrc:   ?PainSc: 5   ? ? ?  ?  ?  ?  ?  ?  ? ?Tyshun Tuckerman P Lanisa Ishler ? ? ? ? ?

## 2021-07-21 NOTE — Discharge Instructions (Addendum)
INSTRUCTIONS FOR AFTER BREAST SURGERY   You will likely have some questions about what to expect following your operation.  The following information will help you and your family understand what to expect when you are discharged from the hospital.  Following these guidelines will help ensure a smooth recovery and reduce risks of complications.  Postoperative instructions include information on: diet, wound care, medications and physical activity.  AFTER SURGERY Expect to go home after the procedure.  In some cases, you may need to spend one night in the hospital for observation.  DIET Breast surgery does not require a specific diet.  However, the healthier you eat the better your body can start healing. It is important to increasing your protein intake.  This means limiting the foods with sugar and carbohydrates.  Focus on vegetables and some meat.  If you have any liposuction during your procedure be sure to drink water.  If your urine is bright yellow, then it is concentrated, and you need to drink more water.  As a general rule after surgery, you should have 8 ounces of water every hour while awake.  If you find you are persistently nauseated or unable to take in liquids let us know.  NO TOBACCO USE or EXPOSURE.  This will slow your healing process and increase the risk of a wound.  WOUND CARE Leave the ACE wrap or binder on for 3 days . Use fragrance free soap.   After 3 days you can remove the ACE wrap or binder to shower. Once dry apply ACE wrap, binder or sports bra.  Use a mild soap like Dial, Dove and Ivory. You may have Topifoam or Lipofoam on.  It is soft and spongy and helps keep you from getting creases if you have liposuction.  This can be removed before the shower and then replaced.  If you need more it is available on [employer] (Lipofoam). If you have steri-strips / tape directly attached to your skin leave them in place. It is OK to get these wet.   No baths, pools or hot tubs for four  weeks. We close your incision to leave the smallest and best-looking scar. No ointment or creams on your incisions until given the go ahead.  Especially not Neosporin (Too many skin reactions with this one).  A few weeks after surgery you can use Mederma and start massaging the scar. We ask you to wear your binder or sports bra for the first 6 weeks around the clock, including while sleeping. This provides added comfort and helps reduce the fluid accumulation at the surgery site.  ACTIVITY No heavy lifting until cleared by the doctor.  This usually means no more than a half-gallon of milk.  It is OK to walk and climb stairs. In fact, moving your legs is very important to decrease your risk of a blood clot.  It will also help keep you from getting deconditioned.  Every 1 to 2 hours get up and walk for 5 minutes. This will help with a quicker recovery back to normal.  Let pain be your guide so you don't do too much.  This is not the time for spring cleaning and don't plan on taking care of anyone else.  This time is for you to recover,  You will be more comfortable if you sleep and rest with your head elevated either with a few pillows under you or in a recliner.  No stomach sleeping for a three months.  WORK Everyone   returns to work at different times. As a rough guide, most people take at least 1 - 2 weeks off prior to returning to work. If you need documentation for your job, bring the forms to your postoperative follow up visit.  DRIVING Arrange for someone to bring you home from the hospital.  You may be able to drive a few days after surgery but not while taking any narcotics or valium.  BOWEL MOVEMENTS Constipation can occur after anesthesia and while taking pain medication.  It is important to stay ahead for your comfort.  We recommend taking Milk of Magnesia (2 tablespoons; twice a day) while taking the pain pills.  MEDICATIONS You may be prescribed should start after surgery At your  preoperative visit for you history and physical you may have been given the following medications: An antibiotic: Start this medication when you get home and take according to the instructions on the bottle. Zofran 4 mg:  This is to treat nausea and vomiting.  You can take this every 6 hours as needed and only if needed. Valium 2 mg: This is for muscle tightness if you have an implant or expander. This will help relax your muscle which also helps with pain control.  This can be taken every 12 hours as needed. Don't drive after taking this medication. Norco (hydrocodone/acetaminophen) 5/325 mg:  This is only to be used after you have taken the motrin or the tylenol. Every 8 hours as needed.   Over the counter Medication to take: Ibuprofen (Motrin) 600 mg:  Take this every 6 hours.  If you have additional pain then take 500 mg of the tylenol every 8 hours.  Only take the Norco after you have tried these two. Miralax or stool softener of choice: Take this according to the bottle if you take the Norco.  WHEN TO CALL Call your surgeon's office if any of the following occur: Fever 101 degrees F or greater Excessive bleeding or fluid from the incision site. Pain that increases over time without aid from the medications Redness, warmth, or pus draining from incision sites Persistent nausea or inability to take in liquids Severe misshapen area that underwent the operation.   Post Anesthesia Home Care Instructions  Activity: Get plenty of rest for the remainder of the day. A responsible individual must stay with you for 24 hours following the procedure.  For the next 24 hours, DO NOT: -Drive a car -Operate machinery -Drink alcoholic beverages -Take any medication unless instructed by your physician -Make any legal decisions or sign important papers.  Meals: Start with liquid foods such as gelatin or soup. Progress to regular foods as tolerated. Avoid greasy, spicy, heavy foods. If nausea  and/or vomiting occur, drink only clear liquids until the nausea and/or vomiting subsides. Call your physician if vomiting continues.  Special Instructions/Symptoms: Your throat may feel dry or sore from the anesthesia or the breathing tube placed in your throat during surgery. If this causes discomfort, gargle with warm salt water. The discomfort should disappear within 24 hours.  If you had a scopolamine patch placed behind your ear for the management of post- operative nausea and/or vomiting:  1. The medication in the patch is effective for 72 hours, after which it should be removed.  Wrap patch in a tissue and discard in the trash. Wash hands thoroughly with soap and water. 2. You may remove the patch earlier than 72 hours if you experience unpleasant side effects which may include dry mouth, dizziness   or visual disturbances. 3. Avoid touching the patch. Wash your hands with soap and water after contact with the patch.     

## 2021-07-22 ENCOUNTER — Ambulatory Visit: Payer: No Typology Code available for payment source

## 2021-07-22 ENCOUNTER — Encounter (HOSPITAL_BASED_OUTPATIENT_CLINIC_OR_DEPARTMENT_OTHER): Payer: Self-pay | Admitting: Plastic Surgery

## 2021-07-23 ENCOUNTER — Telehealth: Payer: Self-pay

## 2021-07-23 ENCOUNTER — Encounter: Payer: Self-pay | Admitting: Surgical

## 2021-07-23 MED ORDER — FLUCONAZOLE 150 MG PO TABS: 150.0000 mg | ORAL_TABLET | Freq: Once | ORAL | 0 refills | Status: AC

## 2021-07-23 NOTE — Telephone Encounter (Signed)
Patient called to say she thinks she is getting a yeast infection from the antibiotic and would like to know what she can do. ? ?Also, patient asked when she can start sleeping on her side.  Please call. ? ?*Patient's preferred pharmacy is [facility] in [place]. ?

## 2021-07-27 NOTE — Progress Notes (Signed)
Patient is a 51 year old female with PMH of right-sided mastectomy with reconstruction and left-sided oncoplastic reduction and mastopexy who recently underwent abdominal liposuction with Lipo filling performed to right breast on 07/21/2021 with Dr. Marla Roe who presents to clinic for postoperative follow-up. ? ?On exam, patient is doing well.  Denies any specific complaints.  If anything, she feels as though her right breast is now larger than the left.  However, she understands that some of the fatty tissue will not survive and her swelling and postoperative changes will continue to settle over the next several weeks.  She reports that there was bruising to her breast and abdomen, but states that she always bruises easily.  She took a tramadol at the beginning, but no longer is requiring any narcotic analgesics. ? ?Physical exam is reassuring.  Old ecchymoses over medial aspect right breast.  Steri-Strip on the body of breast remains firmly intact.  Steri-Strip at inframammary incision has already begun to fall off and was removed without complication.  Incision is well approximated.  Breasts with improved shape and symmetry compared to preop photo.  No obvious subcutaneous fluid collections.  Similar bruising noted in the vicinity of C-section scar where abdominal liposuction was performed.  Mild firmness and possible fat necrosis appreciated.  Minimal tenderness to palpation.  ? ?Patient will continue with activity modifications and compressive garments.  She is safe to transition to spanks and compressive bra, but may continue to use the binders if those are preferred.  She already has an appointment scheduled with Dr. Marla Roe for 08/10/2021.  She will call clinic should she have any questions or concerns.  Picture(s) obtained of the patient and placed in the chart were with the patient's or guardian's permission. ? ?

## 2021-07-29 ENCOUNTER — Encounter: Payer: Self-pay | Admitting: Physician Assistant

## 2021-07-29 ENCOUNTER — Telehealth: Payer: Self-pay

## 2021-07-29 ENCOUNTER — Ambulatory Visit (INDEPENDENT_AMBULATORY_CARE_PROVIDER_SITE_OTHER): Payer: 59 | Admitting: Physician Assistant

## 2021-07-29 DIAGNOSIS — Z9889 Other specified postprocedural states: Secondary | ICD-10-CM

## 2021-07-29 NOTE — Telephone Encounter (Signed)
Informed pt that as long as it was just computer-based work and if she felt up to it, it would be fine for her to return. Pt conveyed understanding.  ?

## 2021-07-29 NOTE — Telephone Encounter (Signed)
Patient asked if she was cleared to go back to work, asking for a call back.  ?

## 2021-07-29 NOTE — Telephone Encounter (Signed)
Sure. 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  If she feels comfortable returning, that's fine by me.

## 2021-07-30 ENCOUNTER — Encounter: Payer: 59 | Admitting: Surgical

## 2021-08-03 ENCOUNTER — Telehealth: Payer: Self-pay

## 2021-08-03 NOTE — Telephone Encounter (Signed)
Patient called to say she had surgery with Dr. Marla Roe on 07/21/2021.  Patient said she stopped taking her Tamoxifen for her surgery and would like to know if she can start taking it again.  Please call. ?

## 2021-08-04 NOTE — Telephone Encounter (Signed)
She may resume her tamoxifen

## 2021-08-04 NOTE — Telephone Encounter (Signed)
Called patient back and advised ok to start taking tamoxifen. ?

## 2021-08-10 ENCOUNTER — Encounter: Payer: Self-pay | Admitting: Plastic Surgery

## 2021-08-10 ENCOUNTER — Other Ambulatory Visit: Payer: Self-pay

## 2021-08-10 ENCOUNTER — Ambulatory Visit (INDEPENDENT_AMBULATORY_CARE_PROVIDER_SITE_OTHER): Payer: 59 | Admitting: Plastic Surgery

## 2021-08-10 DIAGNOSIS — Z9889 Other specified postprocedural states: Secondary | ICD-10-CM

## 2021-08-10 NOTE — Progress Notes (Signed)
? ?  Subjective:  ? ? Patient ID: Christy Ferrell, female    DOB: [birth year], 51 y.o.   MRN: [id number] ? ?The patient is a 51 year old female here for follow-up after undergoing lipo filling of the right breast.  The swelling is minimal and bruising as expected.  No sign of infection.  She is pleased with the symmetry and look.  She is still thinking about NAC reconstruction. ? ? ? ? ?Review of Systems  ?Constitutional: Negative.   ?HENT: Negative.    ?Eyes: Negative.   ?Respiratory: Negative.    ?Cardiovascular: Negative.   ?Gastrointestinal: Negative.   ?Endocrine: Negative.   ?Genitourinary: Negative.   ?Musculoskeletal: Negative.   ?Skin: Negative.   ?Hematological: Negative.   ? ?   ?Objective:  ? Physical Exam ?Constitutional:   ?   Appearance: Normal appearance.  ?HENT:  ?   Head: Normocephalic.  ?Cardiovascular:  ?   Rate and Rhythm: Normal rate.  ?   Pulses: Normal pulses.  ?Skin: ?   Capillary Refill: Capillary refill takes less than 2 seconds.  ?Neurological:  ?   Mental Status: She is alert and oriented to person, place, and time.  ?Psychiatric:     ?   Mood and Affect: Mood normal.     ?   Behavior: Behavior normal.     ?   Thought Content: Thought content normal.     ?   Judgment: Judgment normal.  ? ? ?   ?Assessment & Plan:  ? ?  ICD-10-CM   ?1. S/P breast reconstruction, right  Z98.890   ?  ?Plan for 1 year follow-up.  We will also do a telemetry visit with Matt in the next month to talk about nipple areola tattoo of the right breast. ? ?Pictures were obtained of the patient and placed in the chart with the patient's or guardian's permission. ? ? ?

## 2021-08-18 ENCOUNTER — Ambulatory Visit: Payer: 59 | Admitting: Family Medicine

## 2021-08-18 ENCOUNTER — Encounter: Payer: Self-pay | Admitting: Family Medicine

## 2021-08-18 DIAGNOSIS — R2242 Localized swelling, mass and lump, left lower limb: Secondary | ICD-10-CM | POA: Diagnosis not present

## 2021-08-18 DIAGNOSIS — M79605 Pain in left leg: Secondary | ICD-10-CM

## 2021-08-18 NOTE — Assessment & Plan Note (Addendum)
Leg swelling and pain localized behind and just above knee in pt with h/o breast cancer  ?Tender varicosity noted, likely thrombophlebitis  ?LE doppler ordered  ?Adv elevation /warm compress and compression hose to the waist as tolerated  ?ER precautions discussed/reviewed s/s of DVT  ?Ibuprofen prn (with food)  ?Handout given  ?Plan to follow with imaging report  ? ?

## 2021-08-18 NOTE — Patient Instructions (Signed)
Elevate your leg and put gentle heat on it when you can  ?Get some waist high support stockings to wear during the day (not when sleeping)  ? ?Watch for increase in pain or swelling or redness ?Watch for chest pain or shortness of breath (go to the ER)  ? ?Please keep [place] posted ? ?We will plan a doppler (ultrasound of your leg) and make a plan when we get a result  ?

## 2021-08-18 NOTE — Progress Notes (Signed)
? ?Subjective:  ? ? Patient ID: Christy Ferrell, female    DOB: 1971/04/18, 51 y.o.   MRN: [id number] ? ?HPI ?51 yo pt of Dr Damita Dunnings presents with L leg pain and swelling ? ?Wt Readings from Last 3 Encounters:  ?08/18/21 176 lb 4 oz (79.9 kg)  ?07/21/21 179 lb 10.8 oz (81.5 kg)  ?07/16/21 180 lb (81.6 kg)  ? ?31.22 kg/m? ? ?A varicose vein is swollen and tender in the back of her L knee  ?This is new (vein is old but never bothered her before)  ? ?No pain in calf or foot  ?Does not take blood thinners  ?Occ asa for pain-yesterday for HA / occ ibuprofen also  ? ?Not on feet more than usual  ?Shoes are not new ?No supp stockings  ? ?She has a h/o breast cancer taking zoladex  ?Recent lipo filling of R breast /plastic surgery on 3/8 ?No period of immobility recently ? ?Patient Active Problem List  ? Diagnosis Date Noted  ? Pain in left leg 08/18/2021  ? Localized swelling of left lower leg 08/18/2021  ? Hordeolum externum of left upper eyelid 07/16/2021  ? Bacterial conjunctivitis of left eye 07/16/2021  ? BPV (benign positional vertigo) 06/09/2021  ? Acquired absence of right breast 09/01/2020  ? S/P breast reconstruction, right 07/05/2020  ? H/O mastopexy 07/05/2020  ? Genetic testing 06/23/2020  ? Family history of prostate cancer   ? Family history of breast cancer   ? Family history of leukemia   ? Family history of lung cancer   ? Family history of stomach cancer   ? Hepatic steatosis 02/13/2020  ? Malignant neoplasm of lower-outer quadrant of right breast of female, estrogen receptor positive ([place]) 01/29/2020  ? Diabetes mellitus with microalbuminuria ([place]) 12/04/2019  ? Abnormal thyroid stimulating hormone (TSH) level 09/14/2016  ? Fever blister 09/14/2016  ? Advance care planning 09/14/2016  ? Cough 10/06/2014  ? Headache(784.0) 06/05/2012  ? Shoulder pain 01/18/2012  ? Anxiety 02/18/2011  ? Routine general medical examination at a health care facility 09/03/2010  ? Hypothyroidism 12/14/2006  ? ?Past Medical  History:  ?Diagnosis Date  ? Anxiety   ? h/o, was prev on zoloft  ? Breast cancer ([place]) 01/2020  ? seeing Dr. Marlou Starks  ? Cholelithiasis   ? Diabetes mellitus without complication ([place])   ? Family history of breast cancer   ? Family history of leukemia   ? Family history of lung cancer   ? Family history of prostate cancer   ? Family history of stomach cancer   ? Hypothyroidism   ? postpartum hypothyroidism  ? IUD (intrauterine device) in place   ? mirena per [employer]  ? Major depression   ? ?Past Surgical History:  ?Procedure Laterality Date  ? BREAST RECONSTRUCTION WITH PLACEMENT OF TISSUE EXPANDER AND FLEX HD (ACELLULAR HYDRATED DERMIS) Right 03/30/2020  ? Procedure: BREAST RECONSTRUCTION WITH PLACEMENT OF TISSUE EXPANDER AND FLEX HD (ACELLULAR HYDRATED DERMIS);  Surgeon: Wallace Going, DO;  Location: [facility];  Service: Plastics;  Laterality: Right;  ? BREAST REDUCTION WITH MASTOPEXY Left 05/28/2020  ? Procedure: BREAST REDUCTION WITH MASTOPEXY;  Surgeon: Wallace Going, DO;  Location: [facility];  Service: Plastics;  Laterality: Left;  ? CESAREAN SECTION    ? X 2 Fetal stress, second scheduled  ? CHOLECYSTECTOMY  05/1998  ? LIPOSUCTION WITH LIPOFILLING Right 07/21/2021  ? Procedure: LIPOSUCTION WITH LIPOFILLING OF RIGHT BREAST;  Surgeon:  Dillingham, Loel Lofty, DO;  Location: [facility];  Service: Plastics;  Laterality: Right;  ? MASTECTOMY MODIFIED RADICAL Right 03/30/2020  ? Procedure: RIGHT MASTECTOMY MODIFIED RADICAL;  Surgeon: Jovita Kussmaul, MD;  Location: [facility];  Service: General;  Laterality: Right;  PEC BLOCK, RNFA  ? REMOVAL OF TISSUE EXPANDER AND PLACEMENT OF IMPLANT Right 05/28/2020  ? Procedure: REMOVAL OF TISSUE EXPANDER AND PLACEMENT OF IMPLANT;  Surgeon: Wallace Going, DO;  Location: [facility];  Service: Plastics;  Laterality: Right;  2.5 hours, please  ? ?Social History  ? ?Tobacco Use  ? Smoking  status: Never  ? Smokeless tobacco: Never  ?Vaping Use  ? Vaping Use: Never used  ?Substance Use Topics  ? Alcohol use: Yes  ?  Alcohol/week: 0.0 standard drinks  ?  Comment: Very rarely.  ? Drug use: No  ? ?Family History  ?Problem Relation Age of Onset  ? Hypertension Father   ? Depression Father   ? Prostate cancer Father 43  ?     Prostate, S/P prostatectomy, metastatic  ? Asthma Sister   ? Spina bifida Sister   ? Hypothyroidism Mother   ? Hypertension Maternal Grandmother   ? Hyperlipidemia Maternal Grandmother   ? Lung disease Maternal Grandmother   ? Lung cancer Maternal Grandmother 68  ? Hypothyroidism Maternal Grandfather   ? Prostate cancer Maternal Grandfather   ? Cancer Maternal Grandfather   ?     lung, stomach, and prostate cancers  ? Emphysema Paternal Grandmother   ? Diabetes Maternal Uncle   ? Diabetes Maternal Uncle   ? Leukemia Maternal Uncle 42  ? Prostate cancer Maternal Uncle   ?     dx in his 71s/70s, metastatic  ? Breast cancer Other 72  ?     negative genetic testing; maternal first cousin  ? Stroke Neg Hx   ? Colon cancer Neg Hx   ? ?Allergies  ?Allergen Reactions  ? Codeine Nausea And Vomiting  ? Zoloft [Sertraline Hcl]   ?  headaches  ? ?Current Outpatient Medications on File Prior to Visit  ?Medication Sig Dispense Refill  ? acyclovir ointment (ZOVIRAX) 5 % Apply 1 application topically every 3 (three) hours. As needed. 5 g 5  ? cholecalciferol (VITAMIN D3) 25 MCG (1000 UNIT) tablet Take 1,000 Units by mouth daily.    ? cyclobenzaprine (FLEXERIL) 10 MG tablet TAKE 1/2 TO 1 TABLET BY MOUTH EVERY DAY AS NEEDED 30 tablet 5  ? escitalopram (LEXAPRO) 10 MG tablet Take 1 tablet (10 mg total) by mouth daily. 90 tablet 3  ? goserelin (ZOLADEX) 3.6 MG injection Inject 3.6 mg into the skin every 28 (twenty-eight) days.    ? ibuprofen (ADVIL,MOTRIN) 200 MG tablet Take 200 mg by mouth every 6 (six) hours as needed.    ? levothyroxine (SYNTHROID) 25 MCG tablet TAKE 1 TABLET BY MOUTH EVERY DAY BEFORE  BREAKFAST 30 tablet 2  ? metFORMIN (GLUCOPHAGE) 500 MG tablet 2 tabs a day. 180 tablet 1  ? tamoxifen (NOLVADEX) 20 MG tablet Take 1 tablet (20 mg total) by mouth daily. 90 tablet 4  ? Turmeric (QC TUMERIC COMPLEX PO) Take by mouth.    ? valACYclovir (VALTREX) 1000 MG tablet Take 2 tablets (2,000 mg total) by mouth 2 (two) times daily as needed. For 1 day. 20 tablet 5  ? ?No current facility-administered medications on file prior to visit.  ?  ?Review of Systems  ?Constitutional:  Negative  for activity change, appetite change, fatigue, fever and unexpected weight change.  ?HENT:  Negative for congestion, ear pain, rhinorrhea, sinus pressure and sore throat.   ?Eyes:  Negative for pain, redness and visual disturbance.  ?Respiratory:  Negative for cough, shortness of breath and wheezing.   ?Cardiovascular:  Negative for chest pain and palpitations.  ?Gastrointestinal:  Negative for abdominal pain, blood in stool, constipation and diarrhea.  ?Endocrine: Negative for polydipsia and polyuria.  ?Genitourinary:  Negative for dysuria, frequency and urgency.  ?Musculoskeletal:  Negative for arthralgias, back pain and myalgias.  ?Skin:  Negative for color change, pallor, rash and wound.  ?Allergic/Immunologic: Negative for environmental allergies.  ?Neurological:  Negative for dizziness, syncope and headaches.  ?Hematological:  Negative for adenopathy. Does not bruise/bleed easily.  ?Psychiatric/Behavioral:  Negative for decreased concentration and dysphoric mood. The patient is not nervous/anxious.   ? ?   ?Objective:  ? Physical Exam ?Constitutional:   ?   General: She is not in acute distress. ?   Appearance: Normal appearance. She is obese. She is not ill-appearing or diaphoretic.  ?Eyes:  ?   Conjunctiva/sclera: Conjunctivae normal.  ?   Pupils: Pupils are equal, round, and reactive to light.  ?Neck:  ?   Vascular: No carotid bruit.  ?Cardiovascular:  ?   Rate and Rhythm: Normal rate. Rhythm irregular.  ?   Heart sounds:  Normal heart sounds.  ?Pulmonary:  ?   Effort: Pulmonary effort is normal. No respiratory distress.  ?   Breath sounds: Normal breath sounds. No wheezing or rales.  ?Abdominal:  ?   General: Abdomen is

## 2021-08-19 ENCOUNTER — Other Ambulatory Visit: Payer: Self-pay

## 2021-08-19 ENCOUNTER — Inpatient Hospital Stay: Payer: 59 | Attending: Oncology

## 2021-08-19 ENCOUNTER — Ambulatory Visit (HOSPITAL_BASED_OUTPATIENT_CLINIC_OR_DEPARTMENT_OTHER)
Admission: RE | Admit: 2021-08-19 | Discharge: 2021-08-19 | Disposition: A | Discharge status: Home / Self Care | Payer: 59 | Source: Ambulatory Visit | Attending: Family Medicine | Admitting: Family Medicine

## 2021-08-19 ENCOUNTER — Telehealth: Payer: Self-pay

## 2021-08-19 VITALS — BP 137/84 | HR 86 | Temp 98.2°F | Resp 17

## 2021-08-19 DIAGNOSIS — Z79810 Long term (current) use of selective estrogen receptor modulators (SERMs): Secondary | ICD-10-CM | POA: Diagnosis not present

## 2021-08-19 DIAGNOSIS — Z170 Estrogen receptor positive status [ER+]: Secondary | ICD-10-CM | POA: Insufficient documentation

## 2021-08-19 DIAGNOSIS — M79605 Pain in left leg: Secondary | ICD-10-CM | POA: Insufficient documentation

## 2021-08-19 DIAGNOSIS — C50211 Malignant neoplasm of upper-inner quadrant of right female breast: Secondary | ICD-10-CM | POA: Diagnosis not present

## 2021-08-19 DIAGNOSIS — R2242 Localized swelling, mass and lump, left lower limb: Secondary | ICD-10-CM

## 2021-08-19 DIAGNOSIS — C50511 Malignant neoplasm of lower-outer quadrant of right female breast: Secondary | ICD-10-CM | POA: Diagnosis present

## 2021-08-19 DIAGNOSIS — Z9011 Acquired absence of right breast and nipple: Secondary | ICD-10-CM | POA: Diagnosis not present

## 2021-08-19 MED ORDER — GOSERELIN ACETATE 3.6 MG ~~LOC~~ IMPL
3.6000 mg | DRUG_IMPLANT | Freq: Once | SUBCUTANEOUS | Status: AC
  Administered 2021-08-19: 3.6 mg via SUBCUTANEOUS
  Filled 2021-08-19: qty 3.6

## 2021-08-19 NOTE — Telephone Encounter (Signed)
Call report received from ultrasound.  ?Negative for DVT but did have superficial thrombophlebitis at posterior are of pain. They have released patient and informed our office will contact if any instructions. I have called office and gave Verbal information to [place]. She will give verbal information to Provider in office.  ?

## 2021-08-19 NOTE — Progress Notes (Signed)
VASCULAR LAB ? ? ? ?Left lower extremity venous duplex has been performed. ? ?See CV proc for preliminary results. ? ?Called report to Sheria Lang ? ?Trellis Guirguis, RVT ?08/19/2021, 8:37 AM ? ?

## 2021-08-19 NOTE — Telephone Encounter (Signed)
Thank you.  Please let pt know that her US showed superficial thrombophlebitis as we expected.  Continue elevating/using warm compress and get support hose when she can. Keep [place] posted  ?Schedule a f/u with Dr Damita Dunnings (pcp) in 1-2 weeks if able for a re check  ?Ibuprofen is fine as needed for discomfort  ?A baby asa 81 mg daily may help  ?Take both with food  ?

## 2021-08-19 NOTE — Telephone Encounter (Signed)
Left detailed message on VM per DPR. Will also send Dr Marliss Coots message through [place], also. ?

## 2021-08-22 NOTE — Telephone Encounter (Signed)
Noted. Agreed and thanks.  ?

## 2021-09-03 ENCOUNTER — Ambulatory Visit (INDEPENDENT_AMBULATORY_CARE_PROVIDER_SITE_OTHER): Payer: 59 | Admitting: Surgical

## 2021-09-03 DIAGNOSIS — Z9889 Other specified postprocedural states: Secondary | ICD-10-CM

## 2021-09-03 NOTE — Progress Notes (Signed)
51 year old female status post right breast reconstruction after right mastectomy on 03/30/2020.  She had exchange of the expander to a right breast implant on 05/28/2020.  She most recently underwent fat grafting to the right breast for improved shape and symmetry on 07/21/2021 with Dr. Marla Roe.  She presents today for virtual visit to discuss nipple areolar tattoo to the right breast.  ? ?The patient gave consent to have this visit done by telemedicine / virtual visit, two identifiers were used to identify patient. This is also consent for access the chart and treat the patient via this visit. The patient is located at home.  I, the provider, am at the office.  We spent 5 minutes together for the visit.  Joined by telephone. ? ?Patient has some questions about timing for nipple areolar tattooing after fat grafting.  She also has some general questions about the tattooing process. ? ?We discussed that ideally I would like to wait at least 3 months from her most recent surgery to allow for healing, especially with her history of radiation to the right breast.  We discussed that fat grafting can be done at any point after those 3 months and additional liposuction and fat grafting should not change the location of the nipple areola tattoo significantly.  All of her questions were answered to her content.  She is going to think this over.  We will schedule virtual telephone visit to discuss further in 3 to 4 weeks. ?

## 2021-09-08 ENCOUNTER — Other Ambulatory Visit: Payer: Self-pay | Admitting: Family Medicine

## 2021-09-14 NOTE — Progress Notes (Signed)
? ? ?Christy Ghent, MD ? ? ?Chief Complaint  ?Patient presents with  ? Vaginal Exam  ?  Discomfort during intercourse, no sex drive x 1 yr  ? ? ?HPI: ?     [name] is a 51 y.o. [id number] whose LMP was No LMP recorded. Patient is postmenopausal., presents today for decreased libido for a yr. Pt is s/p breast cancer and is on tamoxifen. Has vaginal dryness/dyspareunia, but sx improved with lubricants. Hx of yeast vag in past, but no vag sx today. Is also on lexapro but has been for yrs and didn't have libido issues in the past with it.  ?Pt denies PMB, does have vasomotor sx.  ?Neg pap/ neg HPV DNA 4/22 ? ? ?Patient Active Problem List  ? Diagnosis Date Noted  ? Pain in left leg 08/18/2021  ? Localized swelling of left lower leg 08/18/2021  ? Hordeolum externum of left upper eyelid 07/16/2021  ? Bacterial conjunctivitis of left eye 07/16/2021  ? BPV (benign positional vertigo) 06/09/2021  ? Acquired absence of right breast 09/01/2020  ? S/P breast reconstruction, right 07/05/2020  ? H/O mastopexy 07/05/2020  ? Genetic testing 06/23/2020  ? Family history of prostate cancer   ? Family history of breast cancer   ? Family history of leukemia   ? Family history of lung cancer   ? Family history of stomach cancer   ? Hepatic steatosis 02/13/2020  ? Malignant neoplasm of lower-outer quadrant of right breast of female, estrogen receptor positive ([place]) 01/29/2020  ? Diabetes mellitus with microalbuminuria ([place]) 12/04/2019  ? Abnormal thyroid stimulating hormone (TSH) level 09/14/2016  ? Fever blister 09/14/2016  ? Advance care planning 09/14/2016  ? Cough 10/06/2014  ? Headache(784.0) 06/05/2012  ? Shoulder pain 01/18/2012  ? Anxiety 02/18/2011  ? Routine general medical examination at a health care facility 09/03/2010  ? Hypothyroidism 12/14/2006  ? ? ?Past Surgical History:  ?Procedure Laterality Date  ? BREAST RECONSTRUCTION WITH PLACEMENT OF TISSUE EXPANDER AND FLEX HD (ACELLULAR HYDRATED DERMIS) Right  03/30/2020  ? Procedure: BREAST RECONSTRUCTION WITH PLACEMENT OF TISSUE EXPANDER AND FLEX HD (ACELLULAR HYDRATED DERMIS);  Surgeon: Wallace Going, DO;  Location: [facility];  Service: Plastics;  Laterality: Right;  ? BREAST REDUCTION WITH MASTOPEXY Left 05/28/2020  ? Procedure: BREAST REDUCTION WITH MASTOPEXY;  Surgeon: Wallace Going, DO;  Location: [facility];  Service: Plastics;  Laterality: Left;  ? CESAREAN SECTION    ? X 2 Fetal stress, second scheduled  ? CHOLECYSTECTOMY  05/1998  ? LIPOSUCTION WITH LIPOFILLING Right 07/21/2021  ? Procedure: LIPOSUCTION WITH LIPOFILLING OF RIGHT BREAST;  Surgeon: Wallace Going, DO;  Location: [facility];  Service: Plastics;  Laterality: Right;  ? MASTECTOMY MODIFIED RADICAL Right 03/30/2020  ? Procedure: RIGHT MASTECTOMY MODIFIED RADICAL;  Surgeon: Jovita Kussmaul, MD;  Location: [facility];  Service: General;  Laterality: Right;  PEC BLOCK, RNFA  ? REMOVAL OF TISSUE EXPANDER AND PLACEMENT OF IMPLANT Right 05/28/2020  ? Procedure: REMOVAL OF TISSUE EXPANDER AND PLACEMENT OF IMPLANT;  Surgeon: Wallace Going, DO;  Location: [facility];  Service: Plastics;  Laterality: Right;  2.5 hours, please  ? ? ?Family History  ?Problem Relation Age of Onset  ? Hypertension Father   ? Depression Father   ? Prostate cancer Father 33  ?     Prostate, S/P prostatectomy, metastatic  ? Asthma Sister   ? Spina bifida  Sister   ? Hypothyroidism Mother   ? Hypertension Maternal Grandmother   ? Hyperlipidemia Maternal Grandmother   ? Lung disease Maternal Grandmother   ? Lung cancer Maternal Grandmother 68  ? Hypothyroidism Maternal Grandfather   ? Prostate cancer Maternal Grandfather   ? Cancer Maternal Grandfather   ?     lung, stomach, and prostate cancers  ? Emphysema Paternal Grandmother   ? Diabetes Maternal Uncle   ? Diabetes Maternal Uncle   ? Leukemia Maternal Uncle 66  ? Prostate cancer Maternal  Uncle   ?     dx in his 68s/70s, metastatic  ? Breast cancer Other 37  ?     negative genetic testing; maternal first cousin  ? Stroke Neg Hx   ? Colon cancer Neg Hx   ? ? ?Social History  ? ?Socioeconomic History  ? Marital status: Divorced  ?  Spouse name: Not on file  ? Number of children: Not on file  ? Years of education: Not on file  ? Highest education level: Not on file  ?Occupational History  ? Occupation: Housewife  ?Tobacco Use  ? Smoking status: Never  ? Smokeless tobacco: Never  ?Vaping Use  ? Vaping Use: Never used  ?Substance and Sexual Activity  ? Alcohol use: Yes  ?  Alcohol/week: 0.0 standard drinks  ?  Comment: Very rarely.  ? Drug use: No  ? Sexual activity: Yes  ?  Birth control/protection: None  ?Other Topics Concern  ? Not on file  ?Social History Narrative  ? Married 1997, divorced 2017  ? In relationship as of 2021  ? 2 kids  ? ?Social Determinants of Health  ? ?Financial Resource Strain: Not on file  ?Food Insecurity: Not on file  ?Transportation Needs: Not on file  ?Physical Activity: Not on file  ?Stress: Not on file  ?Social Connections: Not on file  ?Intimate Partner Violence: Not on file  ? ? ?Outpatient Medications Prior to Visit  ?Medication Sig Dispense Refill  ? acyclovir ointment (ZOVIRAX) 5 % Apply 1 application topically every 3 (three) hours. As needed. 5 g 5  ? cyclobenzaprine (FLEXERIL) 10 MG tablet TAKE 1/2 TO 1 TABLET BY MOUTH EVERY DAY AS NEEDED 30 tablet 5  ? escitalopram (LEXAPRO) 10 MG tablet Take 1 tablet (10 mg total) by mouth daily. 90 tablet 3  ? goserelin (ZOLADEX) 3.6 MG injection Inject 3.6 mg into the skin every 28 (twenty-eight) days.    ? ibuprofen (ADVIL,MOTRIN) 200 MG tablet Take 200 mg by mouth every 6 (six) hours as needed.    ? levothyroxine (SYNTHROID) 25 MCG tablet TAKE 1 TABLET BY MOUTH EVERY DAY BEFORE BREAKFAST 30 tablet 2  ? metFORMIN (GLUCOPHAGE) 500 MG tablet TAKE 1 TABLET (500 MG) DAILY AND THEN INCREASE TO TWICE A DAY AFTER 2 WEEKS IF TOLERATED)  180 tablet 1  ? tamoxifen (NOLVADEX) 20 MG tablet Take 1 tablet (20 mg total) by mouth daily. 90 tablet 4  ? valACYclovir (VALTREX) 1000 MG tablet Take 2 tablets (2,000 mg total) by mouth 2 (two) times daily as needed. For 1 day. 20 tablet 5  ? cholecalciferol (VITAMIN D3) 25 MCG (1000 UNIT) tablet Take 1,000 Units by mouth daily. (Patient not taking: Reported on 09/15/2021)    ? Turmeric (QC TUMERIC COMPLEX PO) Take by mouth. (Patient not taking: Reported on 09/15/2021)    ? ?No facility-administered medications prior to visit.  ? ? ? ? ?ROS: ? ?Review of Systems  ?Constitutional:  Negative for fever.  ?Gastrointestinal:  Negative for blood in stool, constipation, diarrhea, nausea and vomiting.  ?Genitourinary:  Positive for dyspareunia. Negative for dysuria, flank pain, frequency, hematuria, urgency, vaginal bleeding, vaginal discharge and vaginal pain.  ?Musculoskeletal:  Negative for back pain.  ?Skin:  Negative for rash.  ?Psychiatric/Behavioral:  Positive for agitation and dysphoric mood.   ?BREAST: No symptoms ? ? ?OBJECTIVE:  ? ?Vitals:  ?BP 130/80   Ht '5\' 3"'$  (1.6 m)   Wt 178 lb (80.7 kg)   BMI 31.53 kg/m?  ? ?Physical Exam ?Vitals reviewed.  ?Constitutional:   ?   Appearance: She is well-developed.  ?Pulmonary:  ?   Effort: Pulmonary effort is normal.  ?Genitourinary: ?   General: Normal vulva.  ?   Pubic Area: No rash.   ?   Labia:     ?   Right: No rash, tenderness or lesion.     ?   Left: No rash, tenderness or lesion.   ?   Vagina: Normal. No vaginal discharge, erythema or tenderness.  ?   Cervix: Normal.  ?   Uterus: Normal. Not enlarged and not tender.   ?   Adnexa: Right adnexa normal and left adnexa normal.    ?   Right: No mass or tenderness.      ?   Left: No mass or tenderness.    ?Musculoskeletal:     ?   General: Normal range of motion.  ?   Cervical back: Normal range of motion.  ?Skin: ?   General: Skin is warm and dry.  ?Neurological:  ?   General: No focal deficit present.  ?   Mental  Status: She is alert and oriented to person, place, and time.  ?Psychiatric:     ?   Mood and Affect: Mood normal.     ?   Behavior: Behavior normal.     ?   Thought Content: Thought content normal.     ?   Judgm

## 2021-09-15 ENCOUNTER — Ambulatory Visit (INDEPENDENT_AMBULATORY_CARE_PROVIDER_SITE_OTHER): Payer: 59 | Admitting: Obstetrics and Gynecology

## 2021-09-15 ENCOUNTER — Encounter: Payer: Self-pay | Admitting: Obstetrics and Gynecology

## 2021-09-15 ENCOUNTER — Encounter: Payer: Self-pay | Admitting: Hematology and Oncology

## 2021-09-15 VITALS — BP 130/80 | Ht 63.0 in | Wt 178.0 lb

## 2021-09-15 DIAGNOSIS — R6882 Decreased libido: Secondary | ICD-10-CM

## 2021-09-15 DIAGNOSIS — N951 Menopausal and female climacteric states: Secondary | ICD-10-CM

## 2021-09-16 ENCOUNTER — Inpatient Hospital Stay: Payer: 59 | Attending: Oncology

## 2021-09-16 ENCOUNTER — Encounter: Payer: Self-pay | Admitting: Obstetrics and Gynecology

## 2021-09-16 ENCOUNTER — Other Ambulatory Visit: Payer: Self-pay

## 2021-09-16 VITALS — BP 132/81 | HR 78 | Temp 98.2°F | Resp 18

## 2021-09-16 DIAGNOSIS — Z801 Family history of malignant neoplasm of trachea, bronchus and lung: Secondary | ICD-10-CM | POA: Diagnosis not present

## 2021-09-16 DIAGNOSIS — Z800 Family history of malignant neoplasm of digestive organs: Secondary | ICD-10-CM | POA: Insufficient documentation

## 2021-09-16 DIAGNOSIS — Z79810 Long term (current) use of selective estrogen receptor modulators (SERMs): Secondary | ICD-10-CM | POA: Diagnosis not present

## 2021-09-16 DIAGNOSIS — Z9011 Acquired absence of right breast and nipple: Secondary | ICD-10-CM | POA: Insufficient documentation

## 2021-09-16 DIAGNOSIS — Z170 Estrogen receptor positive status [ER+]: Secondary | ICD-10-CM | POA: Diagnosis not present

## 2021-09-16 DIAGNOSIS — C50211 Malignant neoplasm of upper-inner quadrant of right female breast: Secondary | ICD-10-CM | POA: Diagnosis not present

## 2021-09-16 DIAGNOSIS — Z803 Family history of malignant neoplasm of breast: Secondary | ICD-10-CM | POA: Diagnosis not present

## 2021-09-16 DIAGNOSIS — C50511 Malignant neoplasm of lower-outer quadrant of right female breast: Secondary | ICD-10-CM

## 2021-09-16 MED ORDER — GOSERELIN ACETATE 3.6 MG ~~LOC~~ IMPL
3.6000 mg | DRUG_IMPLANT | Freq: Once | SUBCUTANEOUS | Status: AC
  Administered 2021-09-16: 3.6 mg via SUBCUTANEOUS
  Filled 2021-09-16: qty 3.6

## 2021-09-22 ENCOUNTER — Inpatient Hospital Stay (HOSPITAL_BASED_OUTPATIENT_CLINIC_OR_DEPARTMENT_OTHER): Payer: 59 | Admitting: Hematology and Oncology

## 2021-09-22 ENCOUNTER — Inpatient Hospital Stay: Payer: 59

## 2021-09-22 VITALS — BP 125/81 | HR 78 | Temp 97.7°F | Resp 18 | Ht 63.0 in | Wt 176.3 lb

## 2021-09-22 DIAGNOSIS — Z170 Estrogen receptor positive status [ER+]: Secondary | ICD-10-CM

## 2021-09-22 DIAGNOSIS — C50211 Malignant neoplasm of upper-inner quadrant of right female breast: Secondary | ICD-10-CM | POA: Diagnosis not present

## 2021-09-22 DIAGNOSIS — C50511 Malignant neoplasm of lower-outer quadrant of right female breast: Secondary | ICD-10-CM | POA: Diagnosis not present

## 2021-09-22 DIAGNOSIS — C50919 Malignant neoplasm of unspecified site of unspecified female breast: Secondary | ICD-10-CM

## 2021-09-22 DIAGNOSIS — R7989 Other specified abnormal findings of blood chemistry: Secondary | ICD-10-CM

## 2021-09-22 DIAGNOSIS — E1129 Type 2 diabetes mellitus with other diabetic kidney complication: Secondary | ICD-10-CM

## 2021-09-22 LAB — COMPREHENSIVE METABOLIC PANEL
ALT: 38 U/L (ref 0–44)
AST: 55 U/L — ABNORMAL HIGH (ref 15–41)
Albumin: 4.2 g/dL (ref 3.5–5.0)
Alkaline Phosphatase: 57 U/L (ref 38–126)
Anion gap: 7 (ref 5–15)
BUN: 16 mg/dL (ref 6–20)
CO2: 28 mmol/L (ref 22–32)
Calcium: 9.7 mg/dL (ref 8.9–10.3)
Chloride: 105 mmol/L (ref 98–111)
Creatinine, Ser: 0.69 mg/dL (ref 0.44–1.00)
GFR, Estimated: >60 mL/min (ref >60)
Glucose, Bld: 144 mg/dL — ABNORMAL HIGH (ref 70–99)
Potassium: 4.4 mmol/L (ref 3.5–5.1)
Sodium: 140 mmol/L (ref 135–145)
Total Bilirubin: 0.4 mg/dL (ref 0.3–1.2)
Total Protein: 8 g/dL (ref 6.5–8.1)

## 2021-09-22 LAB — CBC WITH DIFFERENTIAL (CANCER CENTER ONLY)
Abs Immature Granulocytes: 0.03 10*3/uL (ref 0.00–0.07)
Basophils Absolute: 0 10*3/uL (ref 0.0–0.1)
Basophils Relative: 0 %
Eosinophils Absolute: 0.1 10*3/uL (ref 0.0–0.5)
Eosinophils Relative: 1 %
HCT: 40.9 % (ref 36.0–46.0)
Hemoglobin: 14.2 g/dL (ref 12.0–15.0)
Immature Granulocytes: 0 %
Lymphocytes Relative: 30 %
Lymphs Abs: 2.9 10*3/uL (ref 0.7–4.0)
MCH: 30.9 pg (ref 26.0–34.0)
MCHC: 34.7 g/dL (ref 30.0–36.0)
MCV: 89.1 fL (ref 80.0–100.0)
Monocytes Absolute: 0.5 10*3/uL (ref 0.1–1.0)
Monocytes Relative: 6 %
Neutro Abs: 6.1 10*3/uL (ref 1.7–7.7)
Neutrophils Relative %: 63 %
Platelet Count: 257 10*3/uL (ref 150–400)
RBC: 4.59 MIL/uL (ref 3.87–5.11)
RDW: 13 % (ref 11.5–15.5)
WBC Count: 9.7 10*3/uL (ref 4.0–10.5)
nRBC: 0 % (ref 0.0–0.2)

## 2021-09-22 NOTE — Progress Notes (Signed)
?[place]  ?[phone] [phone]  ? ? ? ?ID: Christy Ferrell DOB: 1970/07/14  MR#: [id number]  MHW#:[id number] ? ?Patient Care Team: ?[name], MD as PCP - General (Family Medicine) ?Mauro Kaufmann, RN as Oncology Nurse Navigator ?Rockwell Germany, RN as Oncology Nurse Navigator ?Magrinat, Virgie Dad, MD (Inactive) as Consulting Physician (Oncology) ?Jovita Kussmaul, MD as Consulting Physician (General Surgery) ?Dillingham, Loel Lofty, DO as Attending Physician (Plastic Surgery) ?Benay Pike, MD ?OTHER MD: ? ?CHIEF COMPLAINT: Estrogen receptor positive breast cancer (s/p right mastectomy) ? ?CURRENT TREATMENT: tamoxifen, goserelin ? ?INTERVAL HISTORY: ? ?Christy "Angie" returns today for follow up of her estrogen receptor positive breast cancer.  ?She is on zoladex and tamoxifen for anti estrogen therapy. ?She is tolerating this combination well. ?Last mammogram neg of left breast done on 06/21/2021 ?She denies any changes in breast, breathing, bowel habits or urinary habits ?No new neurological complaints. ? ?REVIEW OF SYSTEMS: ?A detailed review of systems today was otherwise stable. ? ? COVID 19 VACCINATION STATUS: Not vaccinated as of December 2022 ? ? ?HISTORY OF CURRENT ILLNESS: ?From the original visit note: ? ?DELLA SCRIVENER herself palpated a right breast mass. She underwent bilateral diagnostic mammography with tomography and right breast ultrasonography at [facility] on 01/15/2020 showing: breast density category B; palpable 1.6 cm mass in right breast at 5:30, with adjacent 5 mm probable satellite mass; 0.9 cm mass in retroareolar right breast, also at 5:30; two enlarged lymph nodes in right breast at 9:30. ? ?Accordingly on 01/17/2020 she proceeded to biopsy of the right breast areas in question. The pathology from this procedure ([id number]) showed:  ?1. Right Breast, 5:30 ? - invasive mammary carcinoma, grade 2 ? - ductal carcinoma in situ, low grade.   ? - Prognostic indicators significant for: estrogen receptor, 95% positive and progesterone receptor, 95% positive, both with strong staining intensity. Proliferation marker Ki67 not obtained. HER2 negative by immunohistochemistry (0). ?2. Right Breast, retroareolar ? - ductal carcinoma in situ ?3. Lymph Node, right axilla ? - invasive mammary carcinoma ? - lymph node tissue not identified ? ?We do not have an e-cadherin or Ki67 on this tissue. ? ?The patient's subsequent history is as detailed below. ? ? ?PAST MEDICAL HISTORY: ?Past Medical History:  ?Diagnosis Date  ? Anxiety   ? h/o, was prev on zoloft  ? Breast cancer ([place]) 01/2020  ? seeing Dr. Marlou Starks  ? Cholelithiasis   ? Diabetes mellitus without complication ([place])   ? Family history of breast cancer   ? Family history of leukemia   ? Family history of lung cancer   ? Family history of prostate cancer   ? Family history of stomach cancer   ? Hypothyroidism   ? postpartum hypothyroidism  ? IUD (intrauterine device) in place   ? mirena per [employer]  ? Major depression   ? ? ?PAST SURGICAL HISTORY: ?Past Surgical History:  ?Procedure Laterality Date  ? BREAST RECONSTRUCTION WITH PLACEMENT OF TISSUE EXPANDER AND FLEX HD (ACELLULAR HYDRATED DERMIS) Right 03/30/2020  ? Procedure: BREAST RECONSTRUCTION WITH PLACEMENT OF TISSUE EXPANDER AND FLEX HD (ACELLULAR HYDRATED DERMIS);  Surgeon: Wallace Going, DO;  Location: [place];  Service: Plastics;  Laterality: Right;  ? BREAST REDUCTION WITH MASTOPEXY Left 05/28/2020  ? Procedure: BREAST REDUCTION WITH MASTOPEXY;  Surgeon: Wallace Going, DO;  Location: [place];  Service: Plastics;  Laterality: Left;  ? CESAREAN SECTION    ?  X 2 Fetal stress, second scheduled  ? CHOLECYSTECTOMY  05/1998  ? LIPOSUCTION WITH LIPOFILLING Right 07/21/2021  ? Procedure: LIPOSUCTION WITH LIPOFILLING OF RIGHT BREAST;  Surgeon: Wallace Going, DO;  Location: [place];   Service: Plastics;  Laterality: Right;  ? MASTECTOMY MODIFIED RADICAL Right 03/30/2020  ? Procedure: RIGHT MASTECTOMY MODIFIED RADICAL;  Surgeon: Jovita Kussmaul, MD;  Location: [place];  Service: General;  Laterality: Right;  PEC BLOCK, RNFA  ? REMOVAL OF TISSUE EXPANDER AND PLACEMENT OF IMPLANT Right 05/28/2020  ? Procedure: REMOVAL OF TISSUE EXPANDER AND PLACEMENT OF IMPLANT;  Surgeon: Wallace Going, DO;  Location: [place];  Service: Plastics;  Laterality: Right;  2.5 hours, please  ? ? ?FAMILY HISTORY: ?Family History  ?Problem Relation Age of Onset  ? Hypertension Father   ? Depression Father   ? Prostate cancer Father 59  ?     Prostate, S/P prostatectomy, metastatic  ? Asthma Sister   ? Spina bifida Sister   ? Hypothyroidism Mother   ? Hypertension Maternal Grandmother   ? Hyperlipidemia Maternal Grandmother   ? Lung disease Maternal Grandmother   ? Lung cancer Maternal Grandmother 58  ? Hypothyroidism Maternal Grandfather   ? Prostate cancer Maternal Grandfather   ? Cancer Maternal Grandfather   ?     lung, stomach, and prostate cancers  ? Emphysema Paternal Grandmother   ? Diabetes Maternal Uncle   ? Diabetes Maternal Uncle   ? Leukemia Maternal Uncle 13  ? Prostate cancer Maternal Uncle   ?     dx in his 45s/70s, metastatic  ? Breast cancer Other 74  ?     negative genetic testing; maternal first cousin  ? Stroke Neg Hx   ? Colon cancer Neg Hx   ? The patient's father died at age 49 from prostate cancer.  The patient's mother is 65 years old as of September 2021.  The patient has 1 uncle with pancreatic cancer and one cousin on the same side of the family with breast cancer. ? ? ?GYNECOLOGIC HISTORY:  ?No LMP recorded. Patient is postmenopausal. ?Menarche: 51 years old ?Age at first live birth: 51 years old ?GX P 2 ?Contraceptive Mirena in place ?Hysterectomy?  No ?BSO?  No ? ? 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  She is divorced.  She lives alone although her significant other Larita Fife (who is disabled secondary to COPD) sometimes comes over.  Her sons are Aileen Pilot (24), who is studying dentistry at [employer] (21), who is also at [employer] [employer] considering dentistry or physical therapy. ? ? ADVANCED DIRECTIVES: Not in place.  The patient intends to name her older son as her healthcare power of attorney and the appropriate documents were giving her on 01/29/2020 for her to complete and notarize at her discretion ? ? ?HEALTH MAINTENANCE: ?Social History  ? ?Tobacco Use  ? Smoking status: Never  ? Smokeless tobacco: Never  ?Vaping Use  ? Vaping Use: Never used  ?Substance Use Topics  ? Alcohol use: Yes  ?  Alcohol/week: 0.0 standard drinks  ?  Comment: Very rarely.  ? Drug use: No  ? ? ? Colonoscopy:  ? PAP: Not up-to-date ? Bone density: Never ?  ?Allergies  ?Allergen Reactions  ? Codeine Nausea And Vomiting  ? Zoloft [Sertraline Hcl]   ?  headaches  ? ? ?  Current Outpatient Medications  ?Medication Sig Dispense Refill  ? acyclovir ointment (ZOVIRAX) 5 % Apply 1 application topically every 3 (three) hours. As needed. 5 g 5  ? cholecalciferol (VITAMIN D3) 25 MCG (1000 UNIT) tablet Take 1,000 Units by mouth daily. (Patient not taking: Reported on 09/15/2021)    ? cyclobenzaprine (FLEXERIL) 10 MG tablet TAKE 1/2 TO 1 TABLET BY MOUTH EVERY DAY AS NEEDED 30 tablet 5  ? escitalopram (LEXAPRO) 10 MG tablet Take 1 tablet (10 mg total) by mouth daily. 90 tablet 3  ? goserelin (ZOLADEX) 3.6 MG injection Inject 3.6 mg into the skin every 28 (twenty-eight) days.    ? ibuprofen (ADVIL,MOTRIN) 200 MG tablet Take 200 mg by mouth every 6 (six) hours as needed.    ? levothyroxine (SYNTHROID) 25 MCG tablet TAKE 1 TABLET BY MOUTH EVERY DAY BEFORE BREAKFAST 30 tablet 2  ? metFORMIN (GLUCOPHAGE) 500 MG tablet TAKE 1 TABLET (500 MG) DAILY AND THEN INCREASE TO TWICE A DAY AFTER 2 WEEKS IF  TOLERATED) 180 tablet 1  ? tamoxifen (NOLVADEX) 20 MG tablet Take 1 tablet (20 mg total) by mouth daily. 90 tablet 4  ? Turmeric (QC TUMERIC COMPLEX PO) Take by mouth. (Patient not taking: Reported on 09/16/18

## 2021-09-22 NOTE — Progress Notes (Signed)
?[place]  ?[phone]) [phone] [phone]  ? ? ? ?ID: Christy Ferrell DOB: 12/03/1970  MR#: [id number]  WUG#:[id number] ? ?Patient Care Team: ?[name], MD as PCP - General (Family Medicine) ?Mauro Kaufmann, RN as Oncology Nurse Navigator ?Rockwell Germany, RN as Oncology Nurse Navigator ?Magrinat, Virgie Dad, MD (Inactive) as Consulting Physician (Oncology) ?Jovita Kussmaul, MD as Consulting Physician (General Surgery) ?Dillingham, Loel Lofty, DO as Attending Physician (Plastic Surgery) ?Benay Pike, MD ?OTHER MD: ? ?CHIEF COMPLAINT: Estrogen receptor positive breast cancer (s/p right mastectomy) ? ?CURRENT TREATMENT: tamoxifen, goserelin ? ?INTERVAL HISTORY: ? ?Christy "Angie" returns today for follow up of her estrogen receptor positive breast cancer.  ?She is on zoladex and tamoxifen for anti estrogen therapy. ?She is tolerating this combination well. ?She denies any new complaints at all.  She had some plastic surgery of the right breast, displacement of fat from the abdomen to the breast and she still feels that there is symmetrical but she was instructed to give it at least 6 months. ?Rest of the pertinent 10 point ROS reviewed and negative ? ?REVIEW OF SYSTEMS: ? ? COVID 19 VACCINATION STATUS: Not vaccinated as of December 2022 ? ? ?HISTORY OF CURRENT ILLNESS: ?From the original visit note: ? ?SANAYAH MUNRO herself palpated a right breast mass. She underwent bilateral diagnostic mammography with tomography and right breast ultrasonography at [facility] on 01/15/2020 showing: breast density category B; palpable 1.6 cm mass in right breast at 5:30, with adjacent 5 mm probable satellite mass; 0.9 cm mass in retroareolar right breast, also at 5:30; two enlarged lymph nodes in right breast at 9:30. ? ?Accordingly on 01/17/2020 she proceeded to biopsy of the right breast areas in question. The pathology from this procedure ([id number]) showed:  ?1. Right Breast,  5:30 ? - invasive mammary carcinoma, grade 2 ? - ductal carcinoma in situ, low grade.  ? - Prognostic indicators significant for: estrogen receptor, 95% positive and progesterone receptor, 95% positive, both with strong staining intensity. Proliferation marker Ki67 not obtained. HER2 negative by immunohistochemistry (0). ?2. Right Breast, retroareolar ? - ductal carcinoma in situ ?3. Lymph Node, right axilla ? - invasive mammary carcinoma ? - lymph node tissue not identified ? ?We do not have an e-cadherin or Ki67 on this tissue. ? ?The patient's subsequent history is as detailed below. ? ? ?PAST MEDICAL HISTORY: ?Past Medical History:  ?Diagnosis Date  ? Anxiety   ? h/o, was prev on zoloft  ? Breast cancer ([place]) 01/2020  ? seeing Dr. Marlou Starks  ? Cholelithiasis   ? Diabetes mellitus without complication ([place])   ? Family history of breast cancer   ? Family history of leukemia   ? Family history of lung cancer   ? Family history of prostate cancer   ? Family history of stomach cancer   ? Hypothyroidism   ? postpartum hypothyroidism  ? IUD (intrauterine device) in place   ? mirena per [employer]  ? Major depression   ? ? ?PAST SURGICAL HISTORY: ?Past Surgical History:  ?Procedure Laterality Date  ? BREAST RECONSTRUCTION WITH PLACEMENT OF TISSUE EXPANDER AND FLEX HD (ACELLULAR HYDRATED DERMIS) Right 03/30/2020  ? Procedure: BREAST RECONSTRUCTION WITH PLACEMENT OF TISSUE EXPANDER AND FLEX HD (ACELLULAR HYDRATED DERMIS);  Surgeon: Wallace Going, DO;  Location: [place];  Service: Plastics;  Laterality: Right;  ? BREAST REDUCTION WITH MASTOPEXY Left 05/28/2020  ? Procedure: BREAST REDUCTION WITH MASTOPEXY;  Surgeon:  Dillingham, Loel Lofty, DO;  Location: [facility];  Service: Plastics;  Laterality: Left;  ? CESAREAN SECTION    ? X 2 Fetal stress, second scheduled  ? CHOLECYSTECTOMY  05/1998  ? LIPOSUCTION WITH LIPOFILLING Right 07/21/2021  ? Procedure: LIPOSUCTION WITH LIPOFILLING OF RIGHT  BREAST;  Surgeon: Wallace Going, DO;  Location: [place];  Service: Plastics;  Laterality: Right;  ? MASTECTOMY MODIFIED RADICAL Right 03/30/2020  ? Procedure: RIGHT MASTECTOMY MODIFIED RADICAL;  Surgeon: Jovita Kussmaul, MD;  Location: [facility];  Service: General;  Laterality: Right;  PEC BLOCK, RNFA  ? REMOVAL OF TISSUE EXPANDER AND PLACEMENT OF IMPLANT Right 05/28/2020  ? Procedure: REMOVAL OF TISSUE EXPANDER AND PLACEMENT OF IMPLANT;  Surgeon: Wallace Going, DO;  Location: [place];  Service: Plastics;  Laterality: Right;  2.5 hours, please  ? ? ?FAMILY HISTORY: ?Family History  ?Problem Relation Age of Onset  ? Hypertension Father   ? Depression Father   ? Prostate cancer Father 63  ?     Prostate, S/P prostatectomy, metastatic  ? Asthma Sister   ? Spina bifida Sister   ? Hypothyroidism Mother   ? Hypertension Maternal Grandmother   ? Hyperlipidemia Maternal Grandmother   ? Lung disease Maternal Grandmother   ? Lung cancer Maternal Grandmother 77  ? Hypothyroidism Maternal Grandfather   ? Prostate cancer Maternal Grandfather   ? Cancer Maternal Grandfather   ?     lung, stomach, and prostate cancers  ? Emphysema Paternal Grandmother   ? Diabetes Maternal Uncle   ? Diabetes Maternal Uncle   ? Leukemia Maternal Uncle 1  ? Prostate cancer Maternal Uncle   ?     dx in his 66s/70s, metastatic  ? Breast cancer Other 38  ?     negative genetic testing; maternal first cousin  ? Stroke Neg Hx   ? Colon cancer Neg Hx   ? The patient's father died at age 73 from prostate cancer.  The patient's mother is 40 years old as of September 2021.  The patient has 1 uncle with pancreatic cancer and one cousin on the same side of the family with breast cancer. ? ? ?GYNECOLOGIC HISTORY:  ?No LMP recorded. Patient is postmenopausal. ?Menarche: 51 years old ?Age at first live birth: 51 years old ?GX P 2 ?Contraceptive Mirena in place ?Hysterectomy?  No ?BSO?   No ? ? 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  She lives alone although her significant other Larita Fife (who is disabled secondary to COPD) sometimes comes over.  Her sons are Aileen Pilot (24), who is studying dentistry at [employer] (21), who is also at [employer] considering dentistry or physical therapy. ? ? ADVANCED DIRECTIVES: Not in place.  The patient intends to name her older son as her healthcare power of attorney and the appropriate documents were giving her on 01/29/2020 for her to complete and notarize at her discretion ? ? ?HEALTH MAINTENANCE: ?Social History  ? ?Tobacco Use  ? Smoking status: Never  ? Smokeless tobacco: Never  ?Vaping Use  ? Vaping Use: Never used  ?Substance Use Topics  ? Alcohol use: Yes  ?  Alcohol/week: 0.0 standard drinks  ?  Comment: Very rarely.  ? Drug use: No  ? ? ? Colonoscopy:  ? PAP: Not up-to-date ? Bone density: Never ?  ?  Allergies  ?Allergen Reactions  ? Codeine Nausea And Vomiting  ? Zoloft [Sertraline Hcl]   ?  headaches  ? ? ?Current Outpatient Medications  ?Medication Sig Dispense Refill  ? acyclovir ointment (ZOVIRAX) 5 % Apply 1 application topically every 3 (three) hours. As needed. 5 g 5  ? cholecalciferol (VITAMIN D3) 25 MCG (1000 UNIT) tablet Take 1,000 Units by mouth daily. (Patient not taking: Reported on 09/15/2021)    ? cyclobenzaprine (FLEXERIL) 10 MG tablet TAKE 1/2 TO 1 TABLET BY MOUTH EVERY DAY AS NEEDED 30 tablet 5  ? escitalopram (LEXAPRO) 10 MG tablet Take 1 tablet (10 mg total) by mouth daily. 90 tablet 3  ? goserelin (ZOLADEX) 3.6 MG injection Inject 3.6 mg into the skin every 28 (twenty-eight) days.    ? ibuprofen (ADVIL,MOTRIN) 200 MG tablet Take 200 mg by mouth every 6 (six) hours as needed.    ? levothyroxine (SYNTHROID) 25 MCG tablet TAKE 1 TABLET BY MOUTH EVERY DAY BEFORE BREAKFAST 30 tablet 2  ? metFORMIN (GLUCOPHAGE)  500 MG tablet TAKE 1 TABLET (500 MG) DAILY AND THEN INCREASE TO TWICE A DAY AFTER 2 WEEKS IF TOLERATED) 180 tablet 1  ? tamoxifen (NOLVADEX) 20 MG tablet Take 1 tablet (20 mg total) by mouth daily. 90 tablet 4  ? Turmeric

## 2021-09-28 ENCOUNTER — Encounter: Payer: Self-pay | Admitting: Hematology and Oncology

## 2021-09-28 LAB — ESTRADIOL, ULTRA SENS: Estradiol, Sensitive: 8.4 pg/mL

## 2021-10-05 ENCOUNTER — Encounter: Payer: Self-pay | Admitting: Plastic Surgery

## 2021-10-05 ENCOUNTER — Ambulatory Visit (INDEPENDENT_AMBULATORY_CARE_PROVIDER_SITE_OTHER): Payer: 59 | Admitting: Plastic Surgery

## 2021-10-05 DIAGNOSIS — Z9889 Other specified postprocedural states: Secondary | ICD-10-CM

## 2021-10-05 DIAGNOSIS — Z9011 Acquired absence of right breast and nipple: Secondary | ICD-10-CM

## 2021-10-05 NOTE — Progress Notes (Signed)
   Subjective:    Patient ID: Christy Ferrell, female    DOB: November 26, 1970, 51 y.o.   MRN: [id number]  The patient is a 51 year old female here for follow-up on her right breast.  She underwent a mastectomy in November 2020 and had the implant placed in January 2022.  She had some fat grafting March 2023.  Overall she is doing really well.  She is got a good form and contour.  There is no sign of infection.  The implant is a bit tight and this is related to the radiation.  But there is no area of concern noted.     Review of Systems  Constitutional: Negative.   Eyes: Negative.   Respiratory: Negative.    Cardiovascular: Negative.   Gastrointestinal: Negative.   Endocrine: Negative.   Genitourinary: Negative.   Musculoskeletal: Negative.   Skin: Negative.       Objective:   Physical Exam Vitals and nursing note reviewed.  Constitutional:      Appearance: Normal appearance.  HENT:     Head: Normocephalic and atraumatic.  Cardiovascular:     Rate and Rhythm: Normal rate.     Pulses: Normal pulses.  Pulmonary:     Effort: Pulmonary effort is normal.  Musculoskeletal:        General: No swelling or deformity.  Skin:    General: Skin is warm.     Capillary Refill: Capillary refill takes less than 2 seconds.     Coloration: Skin is not jaundiced.  Neurological:     Mental Status: She is alert and oriented to person, place, and time.  Psychiatric:        Mood and Affect: Mood normal.        Behavior: Behavior normal.        Thought Content: Thought content normal.        Assessment & Plan:     ICD-10-CM   1. S/P breast reconstruction, right  Z98.890      I like to see the patient back in a year and she can schedule her for areolar reconstruction tattoo at any time. Pictures were obtained of the patient and placed in the chart with the patient's or guardian's permission.

## 2021-10-06 ENCOUNTER — Other Ambulatory Visit: Payer: Self-pay | Admitting: Family Medicine

## 2021-10-14 ENCOUNTER — Other Ambulatory Visit: Payer: Self-pay

## 2021-10-14 ENCOUNTER — Encounter: Payer: Self-pay | Admitting: Surgical

## 2021-10-14 ENCOUNTER — Inpatient Hospital Stay: Payer: 59 | Attending: Oncology

## 2021-10-14 ENCOUNTER — Ambulatory Visit: Payer: 59 | Admitting: Surgical

## 2021-10-14 VITALS — BP 139/88 | HR 82 | Temp 98.1°F | Resp 18

## 2021-10-14 DIAGNOSIS — Z800 Family history of malignant neoplasm of digestive organs: Secondary | ICD-10-CM | POA: Diagnosis not present

## 2021-10-14 DIAGNOSIS — Z79810 Long term (current) use of selective estrogen receptor modulators (SERMs): Secondary | ICD-10-CM | POA: Insufficient documentation

## 2021-10-14 DIAGNOSIS — C50211 Malignant neoplasm of upper-inner quadrant of right female breast: Secondary | ICD-10-CM | POA: Diagnosis not present

## 2021-10-14 DIAGNOSIS — Z801 Family history of malignant neoplasm of trachea, bronchus and lung: Secondary | ICD-10-CM | POA: Diagnosis not present

## 2021-10-14 DIAGNOSIS — Z9011 Acquired absence of right breast and nipple: Secondary | ICD-10-CM | POA: Insufficient documentation

## 2021-10-14 DIAGNOSIS — Z170 Estrogen receptor positive status [ER+]: Secondary | ICD-10-CM | POA: Insufficient documentation

## 2021-10-14 DIAGNOSIS — C50511 Malignant neoplasm of lower-outer quadrant of right female breast: Secondary | ICD-10-CM | POA: Diagnosis present

## 2021-10-14 DIAGNOSIS — Z803 Family history of malignant neoplasm of breast: Secondary | ICD-10-CM | POA: Insufficient documentation

## 2021-10-14 DIAGNOSIS — Z9889 Other specified postprocedural states: Secondary | ICD-10-CM

## 2021-10-14 MED ORDER — GOSERELIN ACETATE 3.6 MG ~~LOC~~ IMPL
3.6000 mg | DRUG_IMPLANT | Freq: Once | SUBCUTANEOUS | Status: AC
  Administered 2021-10-14: 3.6 mg via SUBCUTANEOUS
  Filled 2021-10-14: qty 3.6

## 2021-10-14 NOTE — Progress Notes (Signed)
NIPPLE AREOLAR TATTOO PROCEDURE  PREOPERATIVE DIAGNOSIS:  Acquired absence of right nipple areolar   POSTOPERATIVE DIAGNOSIS: Acquired absence of right nipple areolar    PROCEDURES: right nipple areolar tattoo   ANESTHESIA:  4% lidocaine ointment  COMPLICATIONS: None.  JUSTIFICATION FOR PROCEDURE:  Christy Ferrell is a 51 y.o. female with a history of breast cancer status post breast reconstruction. The patient presents for nipple areolar complex tattoo. Risks, benefits, indications, and alternatives of the above described procedures were discussed with the patient and all the patient's questions were answered. Consent was signed.  DESCRIPTION OF PROCEDURE: After informed consent was obtained and proper identification of patient and surgical site was made, the patient was taken to the procedure room and resting in procedure chair. The patient was prepped and draped in the usual sterile fashion. Attention was turned to the selection of flesh colored permanent tattoo ink to produce the appropriate color for nipple areolar complex tattooing. Color, size and location was confirmed with the patient. Pre-op photos were previously obtained. Using a Digital Revo 7R pronged tattoo head, pigment was instilled to the designed nipple areolar complex, which was confirmed preoperatively with the patient. Once adequate pigment had been applied to the nipple areolar complex, vaseline followed by an occlusive dressing was applied. The patient tolerated the procedure well. There were no complications  The total area tattooed was approximately 4 x 3-1/2 cm for a total of 14 cm  A 3-1 ratio of cool honey and Thera honey was used to create the desired color. 3 parts cool honey -  Exp 05/2024  1 part fair-Honey -  Exp 05/2024   Dark honey was used to instill pigmentation below the nipple to create shadowing Expiration 1//2026

## 2021-11-18 ENCOUNTER — Inpatient Hospital Stay: Payer: 59 | Attending: Oncology

## 2021-12-02 ENCOUNTER — Ambulatory Visit: Payer: 59 | Admitting: Surgical

## 2021-12-16 ENCOUNTER — Inpatient Hospital Stay: Payer: 59 | Attending: Oncology

## 2021-12-16 ENCOUNTER — Other Ambulatory Visit: Payer: Self-pay

## 2021-12-16 VITALS — BP 138/68 | HR 73 | Temp 98.7°F | Resp 18

## 2021-12-16 DIAGNOSIS — Z801 Family history of malignant neoplasm of trachea, bronchus and lung: Secondary | ICD-10-CM | POA: Insufficient documentation

## 2021-12-16 DIAGNOSIS — Z9011 Acquired absence of right breast and nipple: Secondary | ICD-10-CM | POA: Insufficient documentation

## 2021-12-16 DIAGNOSIS — C50511 Malignant neoplasm of lower-outer quadrant of right female breast: Secondary | ICD-10-CM | POA: Diagnosis present

## 2021-12-16 DIAGNOSIS — Z170 Estrogen receptor positive status [ER+]: Secondary | ICD-10-CM | POA: Insufficient documentation

## 2021-12-16 DIAGNOSIS — Z79810 Long term (current) use of selective estrogen receptor modulators (SERMs): Secondary | ICD-10-CM | POA: Insufficient documentation

## 2021-12-16 DIAGNOSIS — C50211 Malignant neoplasm of upper-inner quadrant of right female breast: Secondary | ICD-10-CM | POA: Insufficient documentation

## 2021-12-16 DIAGNOSIS — Z800 Family history of malignant neoplasm of digestive organs: Secondary | ICD-10-CM | POA: Insufficient documentation

## 2021-12-16 DIAGNOSIS — Z803 Family history of malignant neoplasm of breast: Secondary | ICD-10-CM | POA: Diagnosis not present

## 2021-12-16 MED ORDER — GOSERELIN ACETATE 3.6 MG ~~LOC~~ IMPL
3.6000 mg | DRUG_IMPLANT | Freq: Once | SUBCUTANEOUS | Status: AC
  Administered 2021-12-16: 3.6 mg via SUBCUTANEOUS
  Filled 2021-12-16: qty 3.6

## 2022-01-13 ENCOUNTER — Inpatient Hospital Stay: Payer: 59

## 2022-01-18 ENCOUNTER — Encounter: Payer: Self-pay | Admitting: Hematology and Oncology

## 2022-02-02 DIAGNOSIS — L980 Pyogenic granuloma: Secondary | ICD-10-CM | POA: Diagnosis not present

## 2022-02-10 ENCOUNTER — Other Ambulatory Visit: Payer: Self-pay

## 2022-02-10 ENCOUNTER — Encounter: Payer: Self-pay | Admitting: Hematology and Oncology

## 2022-02-10 ENCOUNTER — Inpatient Hospital Stay: Payer: 59 | Attending: Oncology

## 2022-02-10 VITALS — BP 135/79 | HR 75 | Temp 98.7°F | Resp 18

## 2022-02-10 DIAGNOSIS — Z5111 Encounter for antineoplastic chemotherapy: Secondary | ICD-10-CM | POA: Diagnosis present

## 2022-02-10 DIAGNOSIS — Z79899 Other long term (current) drug therapy: Secondary | ICD-10-CM | POA: Diagnosis not present

## 2022-02-10 DIAGNOSIS — C50511 Malignant neoplasm of lower-outer quadrant of right female breast: Secondary | ICD-10-CM | POA: Insufficient documentation

## 2022-02-10 MED ORDER — GOSERELIN ACETATE 3.6 MG ~~LOC~~ IMPL
3.6000 mg | DRUG_IMPLANT | Freq: Once | SUBCUTANEOUS | Status: AC
  Administered 2022-02-10: 3.6 mg via SUBCUTANEOUS
  Filled 2022-02-10: qty 3.6

## 2022-02-23 ENCOUNTER — Encounter: Payer: Self-pay | Admitting: Obstetrics and Gynecology

## 2022-03-09 ENCOUNTER — Other Ambulatory Visit: Payer: Self-pay | Admitting: Family Medicine

## 2022-03-10 ENCOUNTER — Inpatient Hospital Stay: Payer: 59 | Attending: Oncology

## 2022-03-10 VITALS — BP 132/78 | HR 72 | Temp 98.2°F | Resp 18

## 2022-03-10 DIAGNOSIS — Z79899 Other long term (current) drug therapy: Secondary | ICD-10-CM | POA: Insufficient documentation

## 2022-03-10 DIAGNOSIS — C50511 Malignant neoplasm of lower-outer quadrant of right female breast: Secondary | ICD-10-CM | POA: Insufficient documentation

## 2022-03-10 DIAGNOSIS — Z5111 Encounter for antineoplastic chemotherapy: Secondary | ICD-10-CM | POA: Diagnosis present

## 2022-03-10 MED ORDER — GOSERELIN ACETATE 3.6 MG ~~LOC~~ IMPL
3.6000 mg | DRUG_IMPLANT | Freq: Once | SUBCUTANEOUS | Status: AC
  Administered 2022-03-10: 3.6 mg via SUBCUTANEOUS
  Filled 2022-03-10: qty 3.6

## 2022-03-14 ENCOUNTER — Encounter: Payer: Self-pay | Admitting: Hematology and Oncology

## 2022-03-15 ENCOUNTER — Encounter: Payer: Self-pay | Admitting: Hematology and Oncology

## 2022-03-22 ENCOUNTER — Other Ambulatory Visit: Payer: Self-pay | Admitting: Family Medicine

## 2022-03-22 NOTE — Telephone Encounter (Signed)
Patient is due for CPE; please call to schedule. Last was 11/22

## 2022-03-22 NOTE — Telephone Encounter (Signed)
Lvm for patient to call and schedule

## 2022-04-01 ENCOUNTER — Encounter: Payer: Self-pay | Admitting: Family Medicine

## 2022-04-01 ENCOUNTER — Ambulatory Visit: Payer: 59 | Admitting: Family Medicine

## 2022-04-01 VITALS — BP 120/80 | HR 84 | Temp 97.9°F | Ht 63.0 in | Wt 174.0 lb

## 2022-04-01 DIAGNOSIS — R69 Illness, unspecified: Secondary | ICD-10-CM | POA: Diagnosis not present

## 2022-04-01 DIAGNOSIS — E119 Type 2 diabetes mellitus without complications: Secondary | ICD-10-CM | POA: Diagnosis not present

## 2022-04-01 DIAGNOSIS — F419 Anxiety disorder, unspecified: Secondary | ICD-10-CM

## 2022-04-01 DIAGNOSIS — E039 Hypothyroidism, unspecified: Secondary | ICD-10-CM

## 2022-04-01 DIAGNOSIS — R809 Proteinuria, unspecified: Secondary | ICD-10-CM

## 2022-04-01 DIAGNOSIS — E1129 Type 2 diabetes mellitus with other diabetic kidney complication: Secondary | ICD-10-CM | POA: Diagnosis not present

## 2022-04-01 MED ORDER — ESCITALOPRAM OXALATE 10 MG PO TABS: 10.0000 mg | ORAL_TABLET | Freq: Every day | ORAL | 12 refills | Status: DC

## 2022-04-01 MED ORDER — METFORMIN HCL 500 MG PO TABS: 500.0000 mg | ORAL_TABLET | Freq: Two times a day (BID) | ORAL | Status: DC

## 2022-04-01 MED ORDER — ESCITALOPRAM OXALATE 10 MG PO TABS: 10.0000 mg | ORAL_TABLET | Freq: Every day | ORAL | 3 refills | Status: DC

## 2022-04-01 NOTE — Progress Notes (Unsigned)
Diabetes:  Using medications without difficulties: yes Hypoglycemic episodes: no sx unless prolonged fasting and she is careful about that.   Hyperglycemic episodes: no sx Feet problems: no Blood Sugars averaging: not checked.   eye exam within last year: due, d/w pt.   Diet and exercise d/w pt.    Flu shot encouraged.    Still on lexapro. It helped with mood, she wanted to continue.  No SI/HI.  Compliant.  TSH due, on levothyroxine 4mg daily.  No neck mass, no dysphagia.  Compliant, d/w pt.    Meds, vitals, and allergies reviewed.   ROS: Per HPI unless specifically indicated in ROS section   GEN: nad, alert and oriented HEENT: ncat NECK: supple w/o LA CV: rrr. PULM: ctab, no inc wob ABD: soft, +bs EXT: no edema SKIN: no acute rash  Diabetic foot exam: Normal inspection No skin breakdown No calluses  Normal DP pulses Normal sensation to light touch and monofilament Nails normal

## 2022-04-01 NOTE — Patient Instructions (Addendum)
Eye exam when possible.   Take care.  Glad to see you. Go to the lab on the way out.   If you have mychart we'll likely use that to update you.

## 2022-04-02 LAB — MICROALBUMIN / CREATININE URINE RATIO
Creatinine, Urine: 233 mg/dL (ref 20–275)
Microalb Creat Ratio: 19 mcg/mg creat (ref <30)
Microalb, Ur: 4.5 mg/dL

## 2022-04-02 LAB — LIPID PANEL
Cholesterol: 166 mg/dL (ref <200)
HDL: 43 mg/dL — ABNORMAL LOW (ref >=50)
LDL Cholesterol (Calc): 84 mg/dL (calc)
Non-HDL Cholesterol (Calc): 123 mg/dL (calc) (ref <130)
Total CHOL/HDL Ratio: 3.9 (calc) (ref <5.0)
Triglycerides: 327 mg/dL — ABNORMAL HIGH (ref <150)

## 2022-04-02 LAB — COMPREHENSIVE METABOLIC PANEL
AG Ratio: 1.3 (calc) (ref 1.0–2.5)
ALT: 34 U/L — ABNORMAL HIGH (ref 6–29)
AST: 39 U/L — ABNORMAL HIGH (ref 10–35)
Albumin: 4.2 g/dL (ref 3.6–5.1)
Alkaline phosphatase (APISO): 55 U/L (ref 37–153)
BUN: 17 mg/dL (ref 7–25)
CO2: 29 mmol/L (ref 20–32)
Calcium: 9.7 mg/dL (ref 8.6–10.4)
Chloride: 103 mmol/L (ref 98–110)
Creat: 0.65 mg/dL (ref 0.50–1.03)
Globulin: 3.2 g/dL (calc) (ref 1.9–3.7)
Glucose, Bld: 121 mg/dL — ABNORMAL HIGH (ref 65–99)
Potassium: 4.3 mmol/L (ref 3.5–5.3)
Sodium: 140 mmol/L (ref 135–146)
Total Bilirubin: 0.5 mg/dL (ref 0.2–1.2)
Total Protein: 7.4 g/dL (ref 6.1–8.1)

## 2022-04-02 LAB — HEMOGLOBIN A1C
Hgb A1c MFr Bld: 7.4 % of total Hgb — ABNORMAL HIGH (ref <5.7)
Mean Plasma Glucose: 166 mg/dL
eAG (mmol/L): 9.2 mmol/L

## 2022-04-02 LAB — TSH: TSH: 5.24 mIU/L — ABNORMAL HIGH

## 2022-04-03 NOTE — Assessment & Plan Note (Signed)
Improved on Lexapro.  She wanted to continue.  Continue as is.

## 2022-04-03 NOTE — Assessment & Plan Note (Signed)
Compliant with levothyroxine.  Continue as is for now.  See notes on labs.

## 2022-04-03 NOTE — Assessment & Plan Note (Signed)
Continue metformin.  See notes on labs.  Continue work on diet and exercise.  Flu shot encouraged, declined.

## 2022-04-06 ENCOUNTER — Other Ambulatory Visit: Payer: Self-pay

## 2022-04-06 ENCOUNTER — Inpatient Hospital Stay: Payer: 59 | Attending: Oncology | Admitting: Hematology and Oncology

## 2022-04-06 ENCOUNTER — Other Ambulatory Visit: Payer: Self-pay | Admitting: Family Medicine

## 2022-04-06 ENCOUNTER — Inpatient Hospital Stay: Payer: 59

## 2022-04-06 ENCOUNTER — Encounter: Payer: Self-pay | Admitting: Hematology and Oncology

## 2022-04-06 VITALS — BP 135/80 | HR 72 | Temp 97.7°F | Resp 16 | Ht 63.0 in | Wt 176.2 lb

## 2022-04-06 DIAGNOSIS — Z800 Family history of malignant neoplasm of digestive organs: Secondary | ICD-10-CM | POA: Insufficient documentation

## 2022-04-06 DIAGNOSIS — Z79810 Long term (current) use of selective estrogen receptor modulators (SERMs): Secondary | ICD-10-CM | POA: Diagnosis not present

## 2022-04-06 DIAGNOSIS — E039 Hypothyroidism, unspecified: Secondary | ICD-10-CM

## 2022-04-06 DIAGNOSIS — Z170 Estrogen receptor positive status [ER+]: Secondary | ICD-10-CM | POA: Diagnosis not present

## 2022-04-06 DIAGNOSIS — C50511 Malignant neoplasm of lower-outer quadrant of right female breast: Secondary | ICD-10-CM

## 2022-04-06 DIAGNOSIS — Z79899 Other long term (current) drug therapy: Secondary | ICD-10-CM | POA: Diagnosis not present

## 2022-04-06 DIAGNOSIS — R7989 Other specified abnormal findings of blood chemistry: Secondary | ICD-10-CM

## 2022-04-06 DIAGNOSIS — Z801 Family history of malignant neoplasm of trachea, bronchus and lung: Secondary | ICD-10-CM | POA: Insufficient documentation

## 2022-04-06 DIAGNOSIS — Z9011 Acquired absence of right breast and nipple: Secondary | ICD-10-CM | POA: Diagnosis not present

## 2022-04-06 DIAGNOSIS — Z803 Family history of malignant neoplasm of breast: Secondary | ICD-10-CM | POA: Diagnosis not present

## 2022-04-06 DIAGNOSIS — C50919 Malignant neoplasm of unspecified site of unspecified female breast: Secondary | ICD-10-CM

## 2022-04-06 DIAGNOSIS — R809 Proteinuria, unspecified: Secondary | ICD-10-CM

## 2022-04-06 DIAGNOSIS — E119 Type 2 diabetes mellitus without complications: Secondary | ICD-10-CM

## 2022-04-06 DIAGNOSIS — Z923 Personal history of irradiation: Secondary | ICD-10-CM | POA: Diagnosis not present

## 2022-04-06 LAB — CBC WITH DIFFERENTIAL (CANCER CENTER ONLY)
Abs Immature Granulocytes: 0.02 10*3/uL (ref 0.00–0.07)
Basophils Absolute: 0 10*3/uL (ref 0.0–0.1)
Basophils Relative: 0 %
Eosinophils Absolute: 0.1 10*3/uL (ref 0.0–0.5)
Eosinophils Relative: 2 %
HCT: 39.5 % (ref 36.0–46.0)
Hemoglobin: 13.6 g/dL (ref 12.0–15.0)
Immature Granulocytes: 0 %
Lymphocytes Relative: 34 %
Lymphs Abs: 3.2 10*3/uL (ref 0.7–4.0)
MCH: 31.1 pg (ref 26.0–34.0)
MCHC: 34.4 g/dL (ref 30.0–36.0)
MCV: 90.2 fL (ref 80.0–100.0)
Monocytes Absolute: 0.5 10*3/uL (ref 0.1–1.0)
Monocytes Relative: 6 %
Neutro Abs: 5.4 10*3/uL (ref 1.7–7.7)
Neutrophils Relative %: 58 %
Platelet Count: 245 10*3/uL (ref 150–400)
RBC: 4.38 MIL/uL (ref 3.87–5.11)
RDW: 12.8 % (ref 11.5–15.5)
WBC Count: 9.4 10*3/uL (ref 4.0–10.5)
nRBC: 0 % (ref 0.0–0.2)

## 2022-04-06 LAB — CMP (CANCER CENTER ONLY)
ALT: 33 U/L (ref 0–44)
AST: 41 U/L (ref 15–41)
Albumin: 4.1 g/dL (ref 3.5–5.0)
Alkaline Phosphatase: 52 U/L (ref 38–126)
Anion gap: 7 (ref 5–15)
BUN: 16 mg/dL (ref 6–20)
CO2: 26 mmol/L (ref 22–32)
Calcium: 9.4 mg/dL (ref 8.9–10.3)
Chloride: 107 mmol/L (ref 98–111)
Creatinine: 0.81 mg/dL (ref 0.44–1.00)
GFR, Estimated: >60 mL/min (ref >60)
Glucose, Bld: 137 mg/dL — ABNORMAL HIGH (ref 70–99)
Potassium: 4 mmol/L (ref 3.5–5.1)
Sodium: 140 mmol/L (ref 135–145)
Total Bilirubin: 0.3 mg/dL (ref 0.3–1.2)
Total Protein: 7.9 g/dL (ref 6.5–8.1)

## 2022-04-06 MED ORDER — GOSERELIN ACETATE 3.6 MG ~~LOC~~ IMPL
3.6000 mg | DRUG_IMPLANT | Freq: Once | SUBCUTANEOUS | Status: AC
  Administered 2022-04-06: 3.6 mg via SUBCUTANEOUS
  Filled 2022-04-06: qty 3.6

## 2022-04-06 MED ORDER — LEVOTHYROXINE SODIUM 50 MCG PO TABS: 50.0000 ug | ORAL_TABLET | Freq: Every day | ORAL | 1 refills | Status: DC

## 2022-04-06 NOTE — Progress Notes (Signed)
[place]  [phone]) [phone] Fax:(336) [phone]     ID: [name] DOB: 26-Mar-1971  MR#: [id number]  VOH#:[id number]  Patient Care Team: [name], MD as PCP - General (Family Medicine) Mauro Kaufmann, RN as Oncology Nurse Navigator Rockwell Germany, RN as Oncology Nurse Navigator Jovita Kussmaul, MD as Consulting Physician (General Surgery) Dillingham, Loel Lofty, DO as Attending Physician (Plastic Surgery) Benay Pike, MD OTHER MD:  CHIEF COMPLAINT: Estrogen receptor positive breast cancer (s/p right mastectomy)  CURRENT TREATMENT: tamoxifen, goserelin  INTERVAL HISTORY:  Christy "Angie" returns today for follow up of her estrogen receptor positive breast cancer.  She is on zoladex and tamoxifen for anti estrogen therapy. She is tolerating this combination well. Since last visit, she has no new complaints.  She was wondering if she needs to continue intensified screening with MRIs alternating with mammograms.  Her last mammogram was in February 2023.  She is not completely satisfied with the reconstruction but is not really willing to undergo more surgeries at this time. Rest of the pertinent 10 point ROS reviewed and negative  REVIEW OF SYSTEMS:   COVID 19 VACCINATION STATUS: Not vaccinated as of December 2022   HISTORY OF CURRENT ILLNESS: From the original visit note:  Christy Ferrell herself palpated a right breast mass. She underwent bilateral diagnostic mammography with tomography and right breast ultrasonography at [facility] on 01/15/2020 showing: breast density category B; palpable 1.6 cm mass in right breast at 5:30, with adjacent 5 mm probable satellite mass; 0.9 cm mass in retroareolar right breast, also at 5:30; two enlarged lymph nodes in right breast at 9:30.  Accordingly on 01/17/2020 she proceeded to biopsy of the right breast areas in question. The pathology from this procedure ([id number]) showed:  1. Right Breast,  5:30  - invasive mammary carcinoma, grade 2  - ductal carcinoma in situ, low grade.   - Prognostic indicators significant for: estrogen receptor, 95% positive and progesterone receptor, 95% positive, both with strong staining intensity. Proliferation marker Ki67 not obtained. HER2 negative by immunohistochemistry (0). 2. Right Breast, retroareolar  - ductal carcinoma in situ 3. Lymph Node, right axilla  - invasive mammary carcinoma  - lymph node tissue not identified  We do not have an e-cadherin or Ki67 on this tissue.  The patient's subsequent history is as detailed below.   PAST MEDICAL HISTORY: Past Medical History:  Diagnosis Date   Anxiety    h/o, was prev on zoloft   Breast cancer ([place]) 01/2020   seeing Dr. Marlou Starks   Cholelithiasis    Diabetes mellitus without complication [facility])    Family history of breast cancer    Family history of leukemia    Family history of lung cancer    Family history of prostate cancer    Family history of stomach cancer    Hypothyroidism    postpartum hypothyroidism   IUD (intrauterine device) in place    mirena per [facility]   Major depression     PAST SURGICAL HISTORY: Past Surgical History:  Procedure Laterality Date   BREAST RECONSTRUCTION WITH PLACEMENT OF TISSUE EXPANDER AND FLEX HD (ACELLULAR HYDRATED DERMIS) Right 03/30/2020   Procedure: BREAST RECONSTRUCTION WITH PLACEMENT OF TISSUE EXPANDER AND FLEX HD (ACELLULAR HYDRATED DERMIS);  Surgeon: Wallace Going, DO;  Location: [place];  Service: Plastics;  Laterality: Right;   BREAST REDUCTION WITH MASTOPEXY Left 05/28/2020   Procedure: BREAST REDUCTION WITH MASTOPEXY;  Surgeon: Marla Roe,  Loel Lofty, DO;  Location: [place];  Service: Plastics;  Laterality: Left;   CESAREAN SECTION     X 2 Fetal stress, second scheduled   CHOLECYSTECTOMY  05/1998   LIPOSUCTION WITH LIPOFILLING Right 07/21/2021   Procedure: LIPOSUCTION WITH LIPOFILLING OF RIGHT  BREAST;  Surgeon: Wallace Going, DO;  Location: [place];  Service: Plastics;  Laterality: Right;   MASTECTOMY MODIFIED RADICAL Right 03/30/2020   Procedure: RIGHT MASTECTOMY MODIFIED RADICAL;  Surgeon: Jovita Kussmaul, MD;  Location: [place];  Service: General;  Laterality: Right;  PEC BLOCK, RNFA   REMOVAL OF TISSUE EXPANDER AND PLACEMENT OF IMPLANT Right 05/28/2020   Procedure: REMOVAL OF TISSUE EXPANDER AND PLACEMENT OF IMPLANT;  Surgeon: Wallace Going, DO;  Location: [place];  Service: Plastics;  Laterality: Right;  2.5 hours, please    FAMILY HISTORY: Family History  Problem Relation Age of Onset   Hypertension Father    Depression Father    Prostate cancer Father 67       Prostate, S/P prostatectomy, metastatic   Asthma Sister    Spina bifida Sister    Hypothyroidism Mother    Hypertension Maternal Grandmother    Hyperlipidemia Maternal Grandmother    Lung disease Maternal Grandmother    Lung cancer Maternal Grandmother 88   Hypothyroidism Maternal Grandfather    Prostate cancer Maternal Grandfather    Cancer Maternal Grandfather        lung, stomach, and prostate cancers   Emphysema Paternal Grandmother    Diabetes Maternal Uncle    Diabetes Maternal Uncle    Leukemia Maternal Uncle 70   Prostate cancer Maternal Uncle        dx in his 60s/70s, metastatic   Breast cancer Other 22       negative genetic testing; maternal first cousin   Stroke Neg Hx    Colon cancer Neg Hx    The patient's father died at age 82 from prostate cancer.  The patient's mother is 6 years old as of September 2021.  The patient has 1 uncle with pancreatic cancer and one cousin on the same side of the family with breast cancer.   GYNECOLOGIC HISTORY:  No LMP recorded. Patient is postmenopausal. Menarche: 51 years old Age at first live birth: 51 years old [place] P 2 Contraceptive Mirena in place Hysterectomy?  No BSO?   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  She is divorced.  She lives alone although her significant other Larita Fife (who is disabled secondary to COPD) sometimes comes over.  Her sons are Aileen Pilot (24), who is studying dentistry at [employer] (21), who is also at [facility] considering dentistry or physical therapy.   ADVANCED DIRECTIVES: Not in place.  The patient intends to name her older son as her healthcare power of attorney and the appropriate documents were giving her on 01/29/2020 for her to complete and notarize at her discretion   HEALTH MAINTENANCE: Social History   Tobacco Use   Smoking status: Never   Smokeless tobacco: Never  Vaping Use   Vaping Use: Never used  Substance Use Topics   Alcohol use: Yes    Alcohol/week: 0.0 standard drinks of alcohol    Comment: Very rarely.   Drug use: No     Colonoscopy:   PAP: Not up-to-date  Bone density: Never  Allergies  Allergen Reactions   Codeine Nausea And Vomiting   Zoloft [Sertraline Hcl]     headaches    Current Outpatient Medications  Medication Sig Dispense Refill   acyclovir ointment (ZOVIRAX) 5 % Apply 1 application topically every 3 (three) hours. As needed. 5 g 5   cholecalciferol (VITAMIN D3) 25 MCG (1000 UNIT) tablet Take 1,000 Units by mouth daily.     cyclobenzaprine (FLEXERIL) 10 MG tablet TAKE 1/2 TO 1 TABLET BY MOUTH EVERY DAY AS NEEDED 30 tablet 5   escitalopram (LEXAPRO) 10 MG tablet Take 1 tablet (10 mg total) by mouth daily. 30 tablet 12   goserelin (ZOLADEX) 3.6 MG injection Inject 3.6 mg into the skin every 28 (twenty-eight) days.     ibuprofen (ADVIL,MOTRIN) 200 MG tablet Take 200 mg by mouth every 6 (six) hours as needed.     levothyroxine (SYNTHROID) 25 MCG tablet TAKE 1 TABLET BY MOUTH EVERY DAY BEFORE BREAKFAST 90 tablet 1   metFORMIN (GLUCOPHAGE) 500 MG tablet Take 1 tablet (500  mg total) by mouth 2 (two) times daily with a meal.     tamoxifen (NOLVADEX) 20 MG tablet Take 1 tablet (20 mg total) by mouth daily. 90 tablet 4   Turmeric (QC TUMERIC COMPLEX PO) Take by mouth.     valACYclovir (VALTREX) 1000 MG tablet Take 2 tablets (2,000 mg total) by mouth 2 (two) times daily as needed. For 1 day. 20 tablet 5   No current facility-administered medications for this visit.    OBJECTIVE: White woman who appears well  Vitals:   04/06/22 1311  BP: 135/80  Pulse: 72  Resp: 16  Temp: 97.7 F (36.5 C)  SpO2: 97%     Body mass index is 31.21 kg/m.   Wt Readings from Last 3 Encounters:  04/06/22 176 lb 3.2 oz (79.9 kg)  04/01/22 174 lb (78.9 kg)  09/22/21 176 lb 4.8 oz (80 kg)     ECOG FS:1 - Symptomatic but completely ambulatory  Physical Exam Constitutional:      Appearance: Normal appearance.  Chest:     Comments: Right sided mastectomy with reconstruction.  No palpable masses or regional adenopathy.  Left breast normal to inspection and palpation.  No regional adenopathy. Musculoskeletal:        General: No swelling.     Cervical back: Normal range of motion and neck supple. No rigidity or tenderness.  Skin:    General: Skin is warm and dry.  Neurological:     General: No focal deficit present.     Mental Status: She is alert.       LAB RESULTS:  CMP     Component Value Date/Time   NA 140 04/01/2022 1439   K 4.3 04/01/2022 1439   CL 103 04/01/2022 1439   CO2 29 04/01/2022 1439   GLUCOSE 121 (H) 04/01/2022 1439   BUN 17 04/01/2022 1439   CREATININE 0.65 04/01/2022 1439   CALCIUM 9.7 04/01/2022 1439   PROT 7.4 04/01/2022 1439   ALBUMIN 4.2 09/22/2021 1323   AST 39 (H) 04/01/2022 1439   AST 37 09/15/2020 1431   ALT 34 (H) 04/01/2022 1439   ALT 32 09/15/2020 1431   ALKPHOS 57 09/22/2021 1323   BILITOT 0.5 04/01/2022 1439   BILITOT 0.4 09/15/2020 1431   GFRNONAA >60 09/22/2021 1323   GFRNONAA >60 09/15/2020 1431   GFRAA >60 01/29/2020  1526    No results found for: "TOTALPROTELP", "ALBUMINELP", "A1GS", "A2GS", "BETS", "BETA2SER", "GAMS", "  MSPIKE", "SPEI"  Lab Results  Component Value Date   WBC 9.4 04/06/2022   NEUTROABS 5.4 04/06/2022   HGB 13.6 04/06/2022   HCT 39.5 04/06/2022   MCV 90.2 04/06/2022   PLT 245 04/06/2022    No results found for: "LABCA2"  No components found for: "AVWUJW119"  No results for input(s): "INR" in the last 168 hours.  No results found for: "LABCA2"  No results found for: "JYN829"  No results found for: "CAN125"  No results found for: "CAN153"  No results found for: "CA2729"  No components found for: "HGQUANT"  No results found for: "CEA1", "CEA" / No results found for: "CEA1", "CEA"   No results found for: "AFPTUMOR"  No results found for: "CHROMOGRNA"  No results found for: "KPAFRELGTCHN", "LAMBDASER", "KAPLAMBRATIO" (kappa/lambda light chains)  No results found for: "HGBA", "HGBA2QUANT", "HGBFQUANT", "HGBSQUAN" (Hemoglobinopathy evaluation)   No results found for: "LDH"  No results found for: "IRON", "TIBC", "IRONPCTSAT" (Iron and TIBC)  No results found for: "FERRITIN"  Urinalysis    Component Value Date/Time   BILIRUBINUR negative 02/25/2015 1645   PROTEINUR + 0.15 02/25/2015 1645   UROBILINOGEN 0.2 02/25/2015 1645   NITRITE negative 02/25/2015 1645   LEUKOCYTESUR small (1+) (A) 02/25/2015 1645    STUDIES: No results found.   ELIGIBLE FOR AVAILABLE RESEARCH PROTOCOL: AET  ASSESSMENT:   51 y.o. [place] woman status post right breast lower outer quadrant biopsy 01/17/2020  for a clinical T1c N1, stage IB invasive mammary carcinoma, grade 2, estrogen and progesterone receptor positive, HER-2 not amplified  (a) biopsy of the left axillary "lymph node" was positive but included no lymph node tissue  (b) biopsy of a suspicious periareoral area was positive for ductal carcinoma in situ, grade 1  (c) biopsy of a left breast LOQ mass noted on  02/06/2020   (d) CT chest and bone scan 02/11/2020 show no evidence of metastatic disease  (1) genetics testing 02/15/2020 through the [facility] Common Hereditary Cancers Panel.  Found no deleterious mutations in APC, ATM, AXIN2, BARD1, BMPR1A, BRCA1, BRCA2, BRIP1, CDH1, CDK4, CDKN2A (p14ARF), CDKN2A (p16INK4a), CHEK2, CTNNA1, DICER1, EPCAM (Deletion/duplication testing only), GREM1 (promoter region deletion/duplication testing only), KIT, MEN1, MLH1, MSH2, MSH3, MSH6, MUTYH, NBN, NF1, NHTL1, PALB2, PDGFRA, PMS2, POLD1, POLE, PTEN, RAD50, RAD51C, RAD51D, RNF43, SDHB, SDHC, SDHD, SMAD4, SMARCA4. STK11, TP53, TSC1, TSC2, and VHL.  The following genes were evaluated for sequence changes only: SDHA and HOXB13 c.251G>A variant only.   (2) MammaPrint obtained from the 01/17/2020 biopsy read as "low risk" predicting a 93% chance of distant disease free survival at 5 years without the need for chemotherapy  (3) tamoxifen started 02/13/2020 in anticipation of surgical delays  (a) started ovarian suppression 04/30/2020 with monthly goserelin/Zoladex  (4) status post right modified radical mastectomy 03/30/2020 for a pT3 pN1, stage IIA invasive ductal carcinoma, grade 2, with evidence of lymphovascular space involvement but negative margins  (a) a total of 9 right axillary lymph nodes removed, 2 positive with focal extracapsular extension  (b) status post right breast reconstruction 05/28/2020 with a [place] Smooth Round Ultra High Profile Gel 650 cc. Ref #[id number].  Serial Number [id number]; left mastopexy same date  (5) adjuvant radiation completed 08/25/2020   PLAN:  She is tolerating the goserelin and tamoxifen well and the plan is to continue that for minimum of 5 years. I think it is reasonable to even consider antiestrogen therapy for 10 years in this case given the locally advanced disease at the time of  diagnosis.  She is tolerating the combination of Zoladex and tamoxifen very well.  She can  discontinue Zoladex after couple years and just continue tamoxifen versus continue the combination for as long as she tolerates. With regards to intensified screening, she does not have dense breasts and she is on ovarian suppression and antiestrogen therapy hence I have not felt strongly to continue intensified screening.  Her genetic mutation testing was also negative.  She is comfortable with this plan.  Her next mammogram would be in February 2024, this has been ordered.  She will continue self breast exam monthly.  She will return to clinic in 6 months.    Total encounter time 30 minutes.**Total Encounter Time as defined by the Centers for Medicare and Medicaid Services includes, in addition to the face-to-face time of a patient visit (documented in the note above) non-face-to-face time: obtaining and reviewing outside history, ordering and reviewing medications, tests or procedures, care coordination (communications with other health care professionals or caregivers) and documentation in the medical record.

## 2022-04-12 LAB — ESTRADIOL, ULTRA SENS: Estradiol, Sensitive: 7.4 pg/mL

## 2022-04-13 ENCOUNTER — Encounter: Payer: Self-pay | Admitting: Family Medicine

## 2022-04-13 ENCOUNTER — Encounter: Payer: Self-pay | Admitting: Obstetrics and Gynecology

## 2022-04-14 ENCOUNTER — Other Ambulatory Visit: Payer: Self-pay | Admitting: *Deleted

## 2022-04-14 DIAGNOSIS — Z170 Estrogen receptor positive status [ER+]: Secondary | ICD-10-CM

## 2022-04-18 ENCOUNTER — Other Ambulatory Visit: Payer: Self-pay | Admitting: Family Medicine

## 2022-04-18 DIAGNOSIS — E119 Type 2 diabetes mellitus without complications: Secondary | ICD-10-CM

## 2022-04-18 MED ORDER — ATORVASTATIN CALCIUM 10 MG PO TABS: 10.0000 mg | ORAL_TABLET | Freq: Every day | ORAL | 3 refills | Status: DC

## 2022-04-20 ENCOUNTER — Other Ambulatory Visit: Payer: Self-pay | Admitting: Family Medicine

## 2022-05-06 ENCOUNTER — Inpatient Hospital Stay: Payer: 59 | Attending: Hematology and Oncology

## 2022-05-06 ENCOUNTER — Other Ambulatory Visit: Payer: Self-pay

## 2022-05-06 VITALS — BP 127/76 | HR 73 | Temp 98.3°F | Resp 18

## 2022-05-06 DIAGNOSIS — Z79810 Long term (current) use of selective estrogen receptor modulators (SERMs): Secondary | ICD-10-CM | POA: Diagnosis not present

## 2022-05-06 DIAGNOSIS — Z801 Family history of malignant neoplasm of trachea, bronchus and lung: Secondary | ICD-10-CM | POA: Insufficient documentation

## 2022-05-06 DIAGNOSIS — Z803 Family history of malignant neoplasm of breast: Secondary | ICD-10-CM | POA: Diagnosis not present

## 2022-05-06 DIAGNOSIS — Z800 Family history of malignant neoplasm of digestive organs: Secondary | ICD-10-CM | POA: Diagnosis not present

## 2022-05-06 DIAGNOSIS — C50511 Malignant neoplasm of lower-outer quadrant of right female breast: Secondary | ICD-10-CM | POA: Diagnosis not present

## 2022-05-06 DIAGNOSIS — Z923 Personal history of irradiation: Secondary | ICD-10-CM | POA: Diagnosis not present

## 2022-05-06 DIAGNOSIS — Z79899 Other long term (current) drug therapy: Secondary | ICD-10-CM | POA: Diagnosis not present

## 2022-05-06 DIAGNOSIS — Z9011 Acquired absence of right breast and nipple: Secondary | ICD-10-CM | POA: Insufficient documentation

## 2022-05-06 MED ORDER — GOSERELIN ACETATE 3.6 MG ~~LOC~~ IMPL
3.6000 mg | DRUG_IMPLANT | Freq: Once | SUBCUTANEOUS | Status: AC
  Administered 2022-05-06: 3.6 mg via SUBCUTANEOUS
  Filled 2022-05-06: qty 3.6

## 2022-05-19 ENCOUNTER — Other Ambulatory Visit: Payer: Self-pay | Admitting: Family Medicine

## 2022-06-03 ENCOUNTER — Inpatient Hospital Stay: Payer: 59 | Attending: Oncology

## 2022-06-03 VITALS — BP 134/71 | HR 67 | Temp 99.0°F | Resp 18

## 2022-06-03 DIAGNOSIS — Z803 Family history of malignant neoplasm of breast: Secondary | ICD-10-CM | POA: Insufficient documentation

## 2022-06-03 DIAGNOSIS — C50511 Malignant neoplasm of lower-outer quadrant of right female breast: Secondary | ICD-10-CM | POA: Insufficient documentation

## 2022-06-03 DIAGNOSIS — Z79899 Other long term (current) drug therapy: Secondary | ICD-10-CM | POA: Insufficient documentation

## 2022-06-03 DIAGNOSIS — Z79810 Long term (current) use of selective estrogen receptor modulators (SERMs): Secondary | ICD-10-CM | POA: Insufficient documentation

## 2022-06-03 DIAGNOSIS — Z923 Personal history of irradiation: Secondary | ICD-10-CM | POA: Insufficient documentation

## 2022-06-03 DIAGNOSIS — Z801 Family history of malignant neoplasm of trachea, bronchus and lung: Secondary | ICD-10-CM | POA: Insufficient documentation

## 2022-06-03 DIAGNOSIS — Z9011 Acquired absence of right breast and nipple: Secondary | ICD-10-CM | POA: Insufficient documentation

## 2022-06-03 DIAGNOSIS — Z170 Estrogen receptor positive status [ER+]: Secondary | ICD-10-CM

## 2022-06-03 DIAGNOSIS — Z800 Family history of malignant neoplasm of digestive organs: Secondary | ICD-10-CM | POA: Diagnosis not present

## 2022-06-03 MED ORDER — GOSERELIN ACETATE 3.6 MG ~~LOC~~ IMPL
3.6000 mg | DRUG_IMPLANT | Freq: Once | SUBCUTANEOUS | Status: AC
  Administered 2022-06-03: 3.6 mg via SUBCUTANEOUS
  Filled 2022-06-03: qty 3.6

## 2022-06-03 NOTE — Patient Instructions (Signed)
Goserelin Implant What is this medication? GOSERELIN (GOE se rel in) treats prostate cancer and breast cancer. It works by decreasing levels of the hormones testosterone and estrogen in the body. This prevents prostate and breast cancer cells from spreading or growing. It may also be used to treat endometriosis. This is a condition where the tissue that lines the uterus grows outside the uterus. It works by decreasing the amount of estrogen your body makes, which reduces heavy bleeding and pain. It can also be used to help thin the lining of the uterus before a surgery used to prevent or reduce heavy periods. This medicine may be used for other purposes; ask your health care provider or pharmacist if you have questions. COMMON BRAND NAME(S): Zoladex, Zoladex 3-Month What should I tell my care team before I take this medication? They need to know if you have any of these conditions: Bone problems Diabetes Heart disease History of irregular heartbeat or rhythm An unusual or allergic reaction to goserelin, other medications, foods, dyes, or preservatives Pregnant or trying to get pregnant Breastfeeding How should I use this medication? This medication is injected under the skin. It is given by your care team in a hospital or clinic setting. Talk to your care team about the use of this medication in children. Special care may be needed. Overdosage: If you think you have taken too much of this medicine contact a poison control center or emergency room at once. NOTE: This medicine is only for you. Do not share this medicine with others. What if I miss a dose? Keep appointments for follow-up doses. It is important not to miss your dose. Call your care team if you are unable to keep an appointment. What may interact with this medication? Do not take this medication with any of the following: Cisapride Dronedarone Pimozide Thioridazine This medication may also interact with the following: Other  medications that cause heart rhythm changes This list may not describe all possible interactions. Give your health care provider a list of all the medicines, herbs, non-prescription drugs, or dietary supplements you use. Also tell them if you smoke, drink alcohol, or use illegal drugs. Some items may interact with your medicine. What should I watch for while using this medication? Visit your care team for regular checks on your progress. Your symptoms may appear to get worse during the first weeks of this therapy. Tell your care team if your symptoms do not start to get better or if they get worse after this time. Using this medication for a long time may weaken your bones. If you smoke or frequently drink alcohol you may increase your risk of bone loss. A family history of osteoporosis, chronic use of medications for seizures (convulsions), or corticosteroids can also increase your risk of bone loss. The risk of bone fractures may be increased. Talk to your care team about your bone health. This medication may increase blood sugar. The risk may be higher in patients who already have diabetes. Ask your care team what you can do to lower your risk of diabetes while taking this medication. This medication should stop regular monthly menstruation in women. Tell your care team if you continue to menstruate. Talk to your care team if you wish to become pregnant or think you might be pregnant. This medication can cause serious birth defects if taken during pregnancy or for 12 weeks after stopping treatment. Talk to your care team about reliable forms of contraception. Do not breastfeed while taking this   medication. This medication may cause infertility. Talk to your care team if you are concerned about your fertility. What side effects may I notice from receiving this medication? Side effects that you should report to your care team as soon as possible: Allergic reactions--skin rash, itching, hives, swelling  of the face, lips, tongue, or throat Change in the amount of urine Heart attack--pain or tightness in the chest, shoulders, arms, or jaw, nausea, shortness of breath, cold or clammy skin, feeling faint or lightheaded Heart rhythm changes--fast or irregular heartbeat, dizziness, feeling faint or lightheaded, chest pain, trouble breathing High blood sugar (hyperglycemia)--increased thirst or amount of urine, unusual weakness or fatigue, blurry vision High calcium level--increased thirst or amount of urine, nausea, vomiting, confusion, unusual weakness or fatigue, bone pain Pain, redness, irritation, or bruising at the injection site Severe back pain, numbness or weakness of the hands, arms, legs, or feet, loss of coordination, loss of bowel or bladder control Stroke--sudden numbness or weakness of the face, arm, or leg, trouble speaking, confusion, trouble walking, loss of balance or coordination, dizziness, severe headache, change in vision Swelling and pain of the tumor site or lymph nodes Trouble passing urine Side effects that usually do not require medical attention (report to your care team if they continue or are bothersome): Change in sex drive or performance Headache Hot flashes Rapid or extreme change in emotion or mood Sweating Swelling of the ankles, hands, or feet Unusual vaginal discharge, itching, or odor This list may not describe all possible side effects. Call your doctor for medical advice about side effects. You may report side effects to FDA at 1-800-FDA-1088. Where should I keep my medication? This medication is given in a hospital or clinic. It will not be stored at home. NOTE: This sheet is a summary. It may not cover all possible information. If you have questions about this medicine, talk to your doctor, pharmacist, or health care provider.  2023 Elsevier/Gold Standard (2007-06-23 00:00:00)  

## 2022-06-17 ENCOUNTER — Encounter: Payer: Self-pay | Admitting: Hematology and Oncology

## 2022-06-20 NOTE — Progress Notes (Signed)
This encounter was created in error - please disregard.

## 2022-06-21 ENCOUNTER — Other Ambulatory Visit (HOSPITAL_COMMUNITY)
Admission: RE | Admit: 2022-06-21 | Discharge: 2022-06-21 | Disposition: A | Discharge status: Home / Self Care | Payer: 59 | Source: Ambulatory Visit | Attending: Obstetrics and Gynecology | Admitting: Obstetrics and Gynecology

## 2022-06-21 ENCOUNTER — Encounter: Payer: Self-pay | Admitting: Obstetrics and Gynecology

## 2022-06-21 VITALS — BP 118/70 | Ht 63.0 in | Wt 177.0 lb

## 2022-06-21 DIAGNOSIS — N898 Other specified noninflammatory disorders of vagina: Secondary | ICD-10-CM

## 2022-06-21 NOTE — Progress Notes (Signed)
    NURSE VISIT NOTE  Subjective:    Patient ID: Christy Ferrell, female    DOB: October 25, 1970, 52 y.o.   MRN: [id number]  HPI  Patient is a 52 y.o. G35P2002 female who presents for vaginal itching, on and off for the past year. Denies abnormal vaginal bleeding or significant pelvic pain or fever. denies  vaginal discharge and odor . Patient denies STD testing.   Objective:    BP 118/70   Ht '5\' 3"'$  (1.6 m)   Wt 177 lb (80.3 kg)   BMI 31.35 kg/m     Assessment:   1. Vaginal itching      Plan:   Yeast and BV DNA  probe sent to lab. Treatment: Patient will wait for results to be treated properly. ROV prn if symptoms persist or worsen.   Drenda Freeze, CMA

## 2022-06-22 ENCOUNTER — Other Ambulatory Visit: Payer: Self-pay | Admitting: Family Medicine

## 2022-06-22 NOTE — Telephone Encounter (Signed)
Refill request Lexapro Last office visit 04/01/22 Needs follow-up lab, no appointment scheduled Last refill was printed 04/01/22 See allergy/contraindication

## 2022-06-22 NOTE — Telephone Encounter (Signed)
Done. Thanks.  Note put on rx about getting labs done.

## 2022-06-23 LAB — CERVICOVAGINAL ANCILLARY ONLY
Bacterial Vaginitis (gardnerella): NEGATIVE
Candida Glabrata: POSITIVE — AB
Candida Vaginitis: POSITIVE — AB
Comment: NEGATIVE
Comment: NEGATIVE
Comment: NEGATIVE

## 2022-06-24 ENCOUNTER — Telehealth: Payer: Self-pay

## 2022-06-24 ENCOUNTER — Other Ambulatory Visit: Payer: Self-pay | Admitting: Obstetrics and Gynecology

## 2022-06-24 MED ORDER — TERCONAZOLE 0.4 % VA CREA: 1.0000 | TOPICAL_CREAM | Freq: Every day | VAGINAL | 0 refills | Status: AC

## 2022-06-25 NOTE — Telephone Encounter (Signed)
Treated by Dr. Marcelline Mates with terazol. Follow sx as glabrata may not be cause of itching and very hard to treat.

## 2022-07-01 ENCOUNTER — Inpatient Hospital Stay: Payer: 59

## 2022-07-08 ENCOUNTER — Ambulatory Visit: Payer: 59 | Admitting: Family Medicine

## 2022-07-08 ENCOUNTER — Inpatient Hospital Stay: Payer: 59 | Attending: Hematology and Oncology

## 2022-07-08 VITALS — BP 139/67 | HR 78 | Temp 98.4°F | Resp 16

## 2022-07-08 DIAGNOSIS — Z803 Family history of malignant neoplasm of breast: Secondary | ICD-10-CM | POA: Insufficient documentation

## 2022-07-08 DIAGNOSIS — C50511 Malignant neoplasm of lower-outer quadrant of right female breast: Secondary | ICD-10-CM | POA: Insufficient documentation

## 2022-07-08 DIAGNOSIS — Z801 Family history of malignant neoplasm of trachea, bronchus and lung: Secondary | ICD-10-CM | POA: Diagnosis not present

## 2022-07-08 DIAGNOSIS — Z79810 Long term (current) use of selective estrogen receptor modulators (SERMs): Secondary | ICD-10-CM | POA: Diagnosis not present

## 2022-07-08 DIAGNOSIS — Z800 Family history of malignant neoplasm of digestive organs: Secondary | ICD-10-CM | POA: Insufficient documentation

## 2022-07-08 DIAGNOSIS — Z170 Estrogen receptor positive status [ER+]: Secondary | ICD-10-CM

## 2022-07-08 DIAGNOSIS — Z79899 Other long term (current) drug therapy: Secondary | ICD-10-CM | POA: Insufficient documentation

## 2022-07-08 DIAGNOSIS — Z9011 Acquired absence of right breast and nipple: Secondary | ICD-10-CM | POA: Insufficient documentation

## 2022-07-08 MED ORDER — GOSERELIN ACETATE 3.6 MG ~~LOC~~ IMPL
3.6000 mg | DRUG_IMPLANT | Freq: Once | SUBCUTANEOUS | Status: AC
  Administered 2022-07-08: 3.6 mg via SUBCUTANEOUS

## 2022-07-08 NOTE — Patient Instructions (Signed)
Goserelin Implant What is this medication? GOSERELIN (GOE se rel in) treats prostate cancer and breast cancer. It works by decreasing levels of the hormones testosterone and estrogen in the body. This prevents prostate and breast cancer cells from spreading or growing. It may also be used to treat endometriosis. This is a condition where the tissue that lines the uterus grows outside the uterus. It works by decreasing the amount of estrogen your body makes, which reduces heavy bleeding and pain. It can also be used to help thin the lining of the uterus before a surgery used to prevent or reduce heavy periods. This medicine may be used for other purposes; ask your health care provider or pharmacist if you have questions. COMMON BRAND NAME(S): Zoladex, Zoladex 3-Month What should I tell my care team before I take this medication? They need to know if you have any of these conditions: Bone problems Diabetes Heart disease History of irregular heartbeat or rhythm An unusual or allergic reaction to goserelin, other medications, foods, dyes, or preservatives Pregnant or trying to get pregnant Breastfeeding How should I use this medication? This medication is injected under the skin. It is given by your care team in a hospital or clinic setting. Talk to your care team about the use of this medication in children. Special care may be needed. Overdosage: If you think you have taken too much of this medicine contact a poison control center or emergency room at once. NOTE: This medicine is only for you. Do not share this medicine with others. What if I miss a dose? Keep appointments for follow-up doses. It is important not to miss your dose. Call your care team if you are unable to keep an appointment. What may interact with this medication? Do not take this medication with any of the following: Cisapride Dronedarone Pimozide Thioridazine This medication may also interact with the following: Other  medications that cause heart rhythm changes This list may not describe all possible interactions. Give your health care provider a list of all the medicines, herbs, non-prescription drugs, or dietary supplements you use. Also tell them if you smoke, drink alcohol, or use illegal drugs. Some items may interact with your medicine. What should I watch for while using this medication? Visit your care team for regular checks on your progress. Your symptoms may appear to get worse during the first weeks of this therapy. Tell your care team if your symptoms do not start to get better or if they get worse after this time. Using this medication for a long time may weaken your bones. If you smoke or frequently drink alcohol you may increase your risk of bone loss. A family history of osteoporosis, chronic use of medications for seizures (convulsions), or corticosteroids can also increase your risk of bone loss. The risk of bone fractures may be increased. Talk to your care team about your bone health. This medication may increase blood sugar. The risk may be higher in patients who already have diabetes. Ask your care team what you can do to lower your risk of diabetes while taking this medication. This medication should stop regular monthly menstruation in women. Tell your care team if you continue to menstruate. Talk to your care team if you wish to become pregnant or think you might be pregnant. This medication can cause serious birth defects if taken during pregnancy or for 12 weeks after stopping treatment. Talk to your care team about reliable forms of contraception. Do not breastfeed while taking this   medication. This medication may cause infertility. Talk to your care team if you are concerned about your fertility. What side effects may I notice from receiving this medication? Side effects that you should report to your care team as soon as possible: Allergic reactions--skin rash, itching, hives, swelling  of the face, lips, tongue, or throat Change in the amount of urine Heart attack--pain or tightness in the chest, shoulders, arms, or jaw, nausea, shortness of breath, cold or clammy skin, feeling faint or lightheaded Heart rhythm changes--fast or irregular heartbeat, dizziness, feeling faint or lightheaded, chest pain, trouble breathing High blood sugar (hyperglycemia)--increased thirst or amount of urine, unusual weakness or fatigue, blurry vision High calcium level--increased thirst or amount of urine, nausea, vomiting, confusion, unusual weakness or fatigue, bone pain Pain, redness, irritation, or bruising at the injection site Severe back pain, numbness or weakness of the hands, arms, legs, or feet, loss of coordination, loss of bowel or bladder control Stroke--sudden numbness or weakness of the face, arm, or leg, trouble speaking, confusion, trouble walking, loss of balance or coordination, dizziness, severe headache, change in vision Swelling and pain of the tumor site or lymph nodes Trouble passing urine Side effects that usually do not require medical attention (report to your care team if they continue or are bothersome): Change in sex drive or performance Headache Hot flashes Rapid or extreme change in emotion or mood Sweating Swelling of the ankles, hands, or feet Unusual vaginal discharge, itching, or odor This list may not describe all possible side effects. Call your doctor for medical advice about side effects. You may report side effects to FDA at 1-800-FDA-1088. Where should I keep my medication? This medication is given in a hospital or clinic. It will not be stored at home. NOTE: This sheet is a summary. It may not cover all possible information. If you have questions about this medicine, talk to your doctor, pharmacist, or health care provider.  2023 Elsevier/Gold Standard (2021-09-15 00:00:00)  

## 2022-07-15 ENCOUNTER — Encounter: Payer: Self-pay | Admitting: Family Medicine

## 2022-07-15 ENCOUNTER — Ambulatory Visit: Payer: 59 | Admitting: Family Medicine

## 2022-07-15 ENCOUNTER — Encounter: Payer: Self-pay | Admitting: Hematology and Oncology

## 2022-07-15 VITALS — BP 110/70 | HR 79 | Temp 98.0°F | Ht 63.0 in | Wt 175.0 lb

## 2022-07-15 DIAGNOSIS — E119 Type 2 diabetes mellitus without complications: Secondary | ICD-10-CM

## 2022-07-15 DIAGNOSIS — E039 Hypothyroidism, unspecified: Secondary | ICD-10-CM | POA: Diagnosis not present

## 2022-07-15 DIAGNOSIS — E1129 Type 2 diabetes mellitus with other diabetic kidney complication: Secondary | ICD-10-CM | POA: Diagnosis not present

## 2022-07-15 DIAGNOSIS — Z170 Estrogen receptor positive status [ER+]: Secondary | ICD-10-CM | POA: Diagnosis not present

## 2022-07-15 DIAGNOSIS — C50511 Malignant neoplasm of lower-outer quadrant of right female breast: Secondary | ICD-10-CM

## 2022-07-15 DIAGNOSIS — R809 Proteinuria, unspecified: Secondary | ICD-10-CM

## 2022-07-15 NOTE — Patient Instructions (Signed)
Go to the lab on the way out.   If you have mychart we'll likely use that to update you.    Take care.  Glad to see you. I'll update gynecology after I see your labs.

## 2022-07-15 NOTE — Progress Notes (Unsigned)
She is on lipitor in the meantime w/o ADE on med.  No aches on med.    On levothyroxine 50mg after prev elevated TSH.    Still on tamoxifen and zoladex at baseline.  She is trying to work on diet and exercise.  She is having trouble losing weight.  Still fatigued.  Dec in libido since cancer dx and treatment.  Starting lexapro in the past didn't affect libido.  Sleeping well.  She doesn't feel depressed but feels frustrated.  She is having hot flashes.    DM2.  Still on metformin.  Due for f/u labs.    Meds, vitals, and allergies reviewed.   ROS: Per HPI unless specifically indicated in ROS section   [facility] for [facility] about libido and ongoing symptoms.

## 2022-07-16 LAB — CBC WITH DIFFERENTIAL/PLATELET
Absolute Monocytes: 562 cells/uL (ref 200–950)
Basophils Absolute: 54 cells/uL (ref 0–200)
Basophils Relative: 0.5 %
Eosinophils Absolute: 130 cells/uL (ref 15–500)
Eosinophils Relative: 1.2 %
HCT: 42.3 % (ref 35.0–45.0)
Hemoglobin: 14.1 g/dL (ref 11.7–15.5)
Lymphs Abs: 4082 cells/uL — ABNORMAL HIGH (ref 850–3900)
MCH: 29.7 pg (ref 27.0–33.0)
MCHC: 33.3 g/dL (ref 32.0–36.0)
MCV: 89.2 fL (ref 80.0–100.0)
MPV: 10.2 fL (ref 7.5–12.5)
Monocytes Relative: 5.2 %
Neutro Abs: 5972 cells/uL (ref 1500–7800)
Neutrophils Relative %: 55.3 %
Platelets: 270 10*3/uL (ref 140–400)
RBC: 4.74 10*6/uL (ref 3.80–5.10)
RDW: 12.6 % (ref 11.0–15.0)
Total Lymphocyte: 37.8 %
WBC: 10.8 10*3/uL (ref 3.8–10.8)

## 2022-07-16 LAB — LIPID PANEL
Cholesterol: 150 mg/dL (ref <200)
HDL: 43 mg/dL — ABNORMAL LOW (ref >=50)
LDL Cholesterol (Calc): 68 mg/dL (calc)
Non-HDL Cholesterol (Calc): 107 mg/dL (calc) (ref <130)
Total CHOL/HDL Ratio: 3.5 (calc) (ref <5.0)
Triglycerides: 323 mg/dL — ABNORMAL HIGH (ref <150)

## 2022-07-16 LAB — COMPREHENSIVE METABOLIC PANEL
AG Ratio: 1.4 (calc) (ref 1.0–2.5)
ALT: 33 U/L — ABNORMAL HIGH (ref 6–29)
AST: 34 U/L (ref 10–35)
Albumin: 4.4 g/dL (ref 3.6–5.1)
Alkaline phosphatase (APISO): 61 U/L (ref 37–153)
BUN: 15 mg/dL (ref 7–25)
CO2: 26 mmol/L (ref 20–32)
Calcium: 10.2 mg/dL (ref 8.6–10.4)
Chloride: 102 mmol/L (ref 98–110)
Creat: 0.71 mg/dL (ref 0.50–1.03)
Globulin: 3.1 g/dL (calc) (ref 1.9–3.7)
Glucose, Bld: 145 mg/dL — ABNORMAL HIGH (ref 65–99)
Potassium: 4.2 mmol/L (ref 3.5–5.3)
Sodium: 140 mmol/L (ref 135–146)
Total Bilirubin: 0.4 mg/dL (ref 0.2–1.2)
Total Protein: 7.5 g/dL (ref 6.1–8.1)

## 2022-07-16 LAB — HEMOGLOBIN A1C
Hgb A1c MFr Bld: 8.1 % of total Hgb — ABNORMAL HIGH (ref <5.7)
Mean Plasma Glucose: 186 mg/dL
eAG (mmol/L): 10.3 mmol/L

## 2022-07-16 LAB — TSH: TSH: 4.46 mIU/L

## 2022-07-17 ENCOUNTER — Other Ambulatory Visit: Payer: Self-pay | Admitting: Family Medicine

## 2022-07-17 ENCOUNTER — Encounter: Payer: Self-pay | Admitting: Family Medicine

## 2022-07-17 MED ORDER — METFORMIN HCL 500 MG PO TABS: 1000.0000 mg | ORAL_TABLET | Freq: Two times a day (BID) | ORAL | Status: DC

## 2022-07-17 NOTE — Assessment & Plan Note (Signed)
See notes on follow-up labs.  Continue atorvastatin and metformin for now.

## 2022-07-17 NOTE — Assessment & Plan Note (Signed)
History of, with decrease in libido discussed with patient.  Assuming her labs from today are normal, I will ask for input from [place] with gyn clinic about libido and ongoing symptoms.

## 2022-07-17 NOTE — Assessment & Plan Note (Signed)
See notes on follow-up labs.  Continue levothyroxine for now.

## 2022-07-18 ENCOUNTER — Encounter: Payer: Self-pay | Admitting: Hematology and Oncology

## 2022-07-22 ENCOUNTER — Telehealth: Payer: Self-pay | Admitting: Hematology and Oncology

## 2022-07-22 NOTE — Telephone Encounter (Signed)
Left patient a vm a regarding upcoming appointment

## 2022-07-28 ENCOUNTER — Encounter: Payer: Self-pay | Admitting: Hematology and Oncology

## 2022-07-28 ENCOUNTER — Encounter: Payer: Self-pay | Admitting: Obstetrics and Gynecology

## 2022-07-28 ENCOUNTER — Other Ambulatory Visit: Payer: Self-pay | Admitting: *Deleted

## 2022-07-28 DIAGNOSIS — Z170 Estrogen receptor positive status [ER+]: Secondary | ICD-10-CM

## 2022-07-28 DIAGNOSIS — C50919 Malignant neoplasm of unspecified site of unspecified female breast: Secondary | ICD-10-CM

## 2022-07-28 MED ORDER — FLUCONAZOLE 150 MG PO TABS: 150.0000 mg | ORAL_TABLET | Freq: Once | ORAL | 0 refills | Status: AC

## 2022-07-28 MED ORDER — TAMOXIFEN CITRATE 20 MG PO TABS: 20.0000 mg | ORAL_TABLET | Freq: Every day | ORAL | 2 refills | Status: DC

## 2022-07-28 NOTE — Telephone Encounter (Signed)
Tamoxifen refill requested by patient via [place].  Tamoxifen refilled per Dr. Rob Hickman OV note 04/06/22: "tolerating the goserelin and tamoxifen well and the plan is to continue that for minimum of 5 years."

## 2022-07-29 ENCOUNTER — Inpatient Hospital Stay: Payer: 59

## 2022-08-05 ENCOUNTER — Inpatient Hospital Stay: Payer: 59 | Attending: Oncology

## 2022-08-05 VITALS — BP 122/80 | HR 93 | Temp 98.5°F | Resp 18

## 2022-08-05 DIAGNOSIS — Z79810 Long term (current) use of selective estrogen receptor modulators (SERMs): Secondary | ICD-10-CM | POA: Insufficient documentation

## 2022-08-05 DIAGNOSIS — Z800 Family history of malignant neoplasm of digestive organs: Secondary | ICD-10-CM | POA: Insufficient documentation

## 2022-08-05 DIAGNOSIS — Z803 Family history of malignant neoplasm of breast: Secondary | ICD-10-CM | POA: Diagnosis not present

## 2022-08-05 DIAGNOSIS — Z801 Family history of malignant neoplasm of trachea, bronchus and lung: Secondary | ICD-10-CM | POA: Insufficient documentation

## 2022-08-05 DIAGNOSIS — C50511 Malignant neoplasm of lower-outer quadrant of right female breast: Secondary | ICD-10-CM | POA: Insufficient documentation

## 2022-08-05 DIAGNOSIS — Z170 Estrogen receptor positive status [ER+]: Secondary | ICD-10-CM

## 2022-08-05 DIAGNOSIS — Z9011 Acquired absence of right breast and nipple: Secondary | ICD-10-CM | POA: Diagnosis not present

## 2022-08-05 DIAGNOSIS — Z79899 Other long term (current) drug therapy: Secondary | ICD-10-CM | POA: Insufficient documentation

## 2022-08-05 MED ORDER — GOSERELIN ACETATE 3.6 MG ~~LOC~~ IMPL
3.6000 mg | DRUG_IMPLANT | Freq: Once | SUBCUTANEOUS | Status: AC
  Administered 2022-08-05: 3.6 mg via SUBCUTANEOUS
  Filled 2022-08-05: qty 3.6

## 2022-08-05 NOTE — Patient Instructions (Signed)
Goserelin Implant What is this medication? GOSERELIN (GOE se rel in) treats prostate cancer and breast cancer. It works by decreasing levels of the hormones testosterone and estrogen in the body. This prevents prostate and breast cancer cells from spreading or growing. It may also be used to treat endometriosis. This is a condition where the tissue that lines the uterus grows outside the uterus. It works by decreasing the amount of estrogen your body makes, which reduces heavy bleeding and pain. It can also be used to help thin the lining of the uterus before a surgery used to prevent or reduce heavy periods. This medicine may be used for other purposes; ask your health care provider or pharmacist if you have questions. COMMON BRAND NAME(S): Zoladex, Zoladex 3-Month What should I tell my care team before I take this medication? They need to know if you have any of these conditions: Bone problems Diabetes Heart disease History of irregular heartbeat or rhythm An unusual or allergic reaction to goserelin, other medications, foods, dyes, or preservatives Pregnant or trying to get pregnant Breastfeeding How should I use this medication? This medication is injected under the skin. It is given by your care team in a hospital or clinic setting. Talk to your care team about the use of this medication in children. Special care may be needed. Overdosage: If you think you have taken too much of this medicine contact a poison control center or emergency room at once. NOTE: This medicine is only for you. Do not share this medicine with others. What if I miss a dose? Keep appointments for follow-up doses. It is important not to miss your dose. Call your care team if you are unable to keep an appointment. What may interact with this medication? Do not take this medication with any of the following: Cisapride Dronedarone Pimozide Thioridazine This medication may also interact with the following: Other  medications that cause heart rhythm changes This list may not describe all possible interactions. Give your health care provider a list of all the medicines, herbs, non-prescription drugs, or dietary supplements you use. Also tell them if you smoke, drink alcohol, or use illegal drugs. Some items may interact with your medicine. What should I watch for while using this medication? Visit your care team for regular checks on your progress. Your symptoms may appear to get worse during the first weeks of this therapy. Tell your care team if your symptoms do not start to get better or if they get worse after this time. Using this medication for a long time may weaken your bones. If you smoke or frequently drink alcohol you may increase your risk of bone loss. A family history of osteoporosis, chronic use of medications for seizures (convulsions), or corticosteroids can also increase your risk of bone loss. The risk of bone fractures may be increased. Talk to your care team about your bone health. This medication may increase blood sugar. The risk may be higher in patients who already have diabetes. Ask your care team what you can do to lower your risk of diabetes while taking this medication. This medication should stop regular monthly menstruation in women. Tell your care team if you continue to menstruate. Talk to your care team if you wish to become pregnant or think you might be pregnant. This medication can cause serious birth defects if taken during pregnancy or for 12 weeks after stopping treatment. Talk to your care team about reliable forms of contraception. Do not breastfeed while taking this   medication. This medication may cause infertility. Talk to your care team if you are concerned about your fertility. What side effects may I notice from receiving this medication? Side effects that you should report to your care team as soon as possible: Allergic reactions--skin rash, itching, hives, swelling  of the face, lips, tongue, or throat Change in the amount of urine Heart attack--pain or tightness in the chest, shoulders, arms, or jaw, nausea, shortness of breath, cold or clammy skin, feeling faint or lightheaded Heart rhythm changes--fast or irregular heartbeat, dizziness, feeling faint or lightheaded, chest pain, trouble breathing High blood sugar (hyperglycemia)--increased thirst or amount of urine, unusual weakness or fatigue, blurry vision High calcium level--increased thirst or amount of urine, nausea, vomiting, confusion, unusual weakness or fatigue, bone pain Pain, redness, irritation, or bruising at the injection site Severe back pain, numbness or weakness of the hands, arms, legs, or feet, loss of coordination, loss of bowel or bladder control Stroke--sudden numbness or weakness of the face, arm, or leg, trouble speaking, confusion, trouble walking, loss of balance or coordination, dizziness, severe headache, change in vision Swelling and pain of the tumor site or lymph nodes Trouble passing urine Side effects that usually do not require medical attention (report to your care team if they continue or are bothersome): Change in sex drive or performance Headache Hot flashes Rapid or extreme change in emotion or mood Sweating Swelling of the ankles, hands, or feet Unusual vaginal discharge, itching, or odor This list may not describe all possible side effects. Call your doctor for medical advice about side effects. You may report side effects to FDA at 1-800-FDA-1088. Where should I keep my medication? This medication is given in a hospital or clinic. It will not be stored at home. NOTE: This sheet is a summary. It may not cover all possible information. If you have questions about this medicine, talk to your doctor, pharmacist, or health care provider.  2023 Elsevier/Gold Standard (2021-09-15 00:00:00)  

## 2022-08-12 ENCOUNTER — Encounter: Payer: Self-pay | Admitting: Plastic Surgery

## 2022-08-12 ENCOUNTER — Ambulatory Visit: Payer: 59 | Admitting: Plastic Surgery

## 2022-08-12 VITALS — BP 141/80 | HR 88

## 2022-08-12 DIAGNOSIS — Z9889 Other specified postprocedural states: Secondary | ICD-10-CM

## 2022-08-12 DIAGNOSIS — Z9011 Acquired absence of right breast and nipple: Secondary | ICD-10-CM | POA: Diagnosis not present

## 2022-08-12 NOTE — Progress Notes (Signed)
Patient ID: Christy Ferrell, female    DOB: 1971/03/11, 52 y.o.   MRN: [id number]   Chief Complaint  Patient presents with   Follow-up   Breast Problem    The patient is a 52 yrs old female here for a one year follow up on her breast reconstruction. She had a mass in the right axilla and was found to have invasive breast cancer and ductal carcinoma in situ.  The lymph nodes were positive.  It was ER and PR positive and HER-2 negative. She underwent a right breast mastectomy with expander placement on 11/21.  The exchange to an implant was on 1/13 and she had a 650 cc gel implant placed. She then had fat grafting in 3/23.  She has lost some of the fat.  Overall she is doing very well.  She is due for her left mammogram.   No areas of concern.      Review of Systems  Constitutional: Negative.   Eyes: Negative.   Respiratory: Negative.  Negative for chest tightness and shortness of breath.   Cardiovascular: Negative.   Gastrointestinal: Negative.   Endocrine: Negative.   Genitourinary: Negative.   Musculoskeletal: Negative.     Past Medical History:  Diagnosis Date   Anxiety    h/o, was prev on zoloft   Breast cancer ([place]) 01/2020   seeing Dr. Marlou Starks   Cholelithiasis    Diabetes mellitus without complication [facility])    Family history of breast cancer    Family history of leukemia    Family history of lung cancer    Family history of prostate cancer    Family history of stomach cancer    Hypothyroidism    postpartum hypothyroidism   IUD (intrauterine device) in place    mirena per [facility]   Major depression     Past Surgical History:  Procedure Laterality Date   BREAST RECONSTRUCTION WITH PLACEMENT OF TISSUE EXPANDER AND FLEX HD (ACELLULAR HYDRATED DERMIS) Right 03/30/2020   Procedure: BREAST RECONSTRUCTION WITH PLACEMENT OF TISSUE EXPANDER AND FLEX HD (ACELLULAR HYDRATED DERMIS);  Surgeon: Wallace Going, DO;  Location: [facility];  Service:  Plastics;  Laterality: Right;   BREAST REDUCTION WITH MASTOPEXY Left 05/28/2020   Procedure: BREAST REDUCTION WITH MASTOPEXY;  Surgeon: Wallace Going, DO;  Location: [facility];  Service: Plastics;  Laterality: Left;   CESAREAN SECTION     X 2 Fetal stress, second scheduled   CHOLECYSTECTOMY  05/1998   LIPOSUCTION WITH LIPOFILLING Right 07/21/2021   Procedure: LIPOSUCTION WITH LIPOFILLING OF RIGHT BREAST;  Surgeon: Wallace Going, DO;  Location: [facility];  Service: Plastics;  Laterality: Right;   MASTECTOMY MODIFIED RADICAL Right 03/30/2020   Procedure: RIGHT MASTECTOMY MODIFIED RADICAL;  Surgeon: Jovita Kussmaul, MD;  Location: [facility];  Service: General;  Laterality: Right;  PEC BLOCK, RNFA   REMOVAL OF TISSUE EXPANDER AND PLACEMENT OF IMPLANT Right 05/28/2020   Procedure: REMOVAL OF TISSUE EXPANDER AND PLACEMENT OF IMPLANT;  Surgeon: Wallace Going, DO;  Location: [facility];  Service: Plastics;  Laterality: Right;  2.5 hours, please      Current Outpatient Medications:    acyclovir ointment (ZOVIRAX) 5 %, Apply 1 application topically every 3 (three) hours. As needed., Disp: 5 g, Rfl: 5   atorvastatin (LIPITOR) 10 MG tablet, Take 1 tablet (10 mg total) by mouth daily., Disp: 90 tablet, Rfl: 3  cholecalciferol (VITAMIN D3) 25 MCG (1000 UNIT) tablet, Take 1,000 Units by mouth daily., Disp: , Rfl:    cyclobenzaprine (FLEXERIL) 10 MG tablet, TAKE 1/2 TO 1 TABLET BY MOUTH EVERY DAY AS NEEDED, Disp: 30 tablet, Rfl: 5   escitalopram (LEXAPRO) 10 MG tablet, TAKE 1 TABLET BY MOUTH EVERY DAY, Disp: 90 tablet, Rfl: 2   goserelin (ZOLADEX) 3.6 MG injection, Inject 3.6 mg into the skin every 28 (twenty-eight) days., Disp: , Rfl:    ibuprofen (ADVIL,MOTRIN) 200 MG tablet, Take 200 mg by mouth every 6 (six) hours as needed., Disp: , Rfl:    levothyroxine (SYNTHROID) 50 MCG tablet, Take 1 tablet (50 mcg total) by mouth daily  before breakfast., Disp: 90 tablet, Rfl: 1   metFORMIN (GLUCOPHAGE) 500 MG tablet, Take 2 tablets (1,000 mg total) by mouth 2 (two) times daily with a meal., Disp: , Rfl:    tamoxifen (NOLVADEX) 20 MG tablet, Take 1 tablet (20 mg total) by mouth daily., Disp: 90 tablet, Rfl: 2   Turmeric (QC TUMERIC COMPLEX PO), Take by mouth., Disp: , Rfl:    valACYclovir (VALTREX) 1000 MG tablet, TAKE 2 TABLETS (2,000 MG TOTAL) BY MOUTH 2 (TWO) TIMES DAILY AS NEEDED. FOR 1 DAY., Disp: 20 tablet, Rfl: 5   Objective:   Vitals:   08/12/22 1348  BP: (!) 141/80  Pulse: 88  SpO2: 95%    Physical Exam Constitutional:      Appearance: Normal appearance.  Cardiovascular:     Rate and Rhythm: Normal rate.     Pulses: Normal pulses.  Pulmonary:     Effort: Pulmonary effort is normal.  Abdominal:     Palpations: Abdomen is soft.  Musculoskeletal:        General: Normal range of motion.  Skin:    General: Skin is warm.     Capillary Refill: Capillary refill takes less than 2 seconds.     Coloration: Skin is not jaundiced.     Findings: No bruising.  Neurological:     Mental Status: She is alert and oriented to person, place, and time.  Psychiatric:        Mood and Affect: Mood normal.        Behavior: Behavior normal.        Thought Content: Thought content normal.        Judgment: Judgment normal.     Assessment & Plan:  Acquired absence of right breast  H/O mastopexy  Continue light massage. We discussed the option for more grafting.  She will wait till she looses a little weight.  We'll plan to see her back in 6 months.  Pictures were obtained of the patient and placed in the chart with the patient's or guardian's permission.   [place], DO

## 2022-08-26 ENCOUNTER — Inpatient Hospital Stay: Payer: 59

## 2022-09-02 ENCOUNTER — Inpatient Hospital Stay: Payer: 59

## 2022-09-02 ENCOUNTER — Inpatient Hospital Stay: Payer: 59 | Attending: Oncology

## 2022-09-02 ENCOUNTER — Telehealth: Payer: Self-pay | Admitting: Hematology and Oncology

## 2022-09-02 VITALS — BP 131/80 | HR 71 | Temp 98.0°F | Resp 18

## 2022-09-02 DIAGNOSIS — Z5111 Encounter for antineoplastic chemotherapy: Secondary | ICD-10-CM | POA: Diagnosis not present

## 2022-09-02 DIAGNOSIS — Z170 Estrogen receptor positive status [ER+]: Secondary | ICD-10-CM | POA: Insufficient documentation

## 2022-09-02 DIAGNOSIS — C50511 Malignant neoplasm of lower-outer quadrant of right female breast: Secondary | ICD-10-CM | POA: Diagnosis not present

## 2022-09-02 MED ORDER — GOSERELIN ACETATE 3.6 MG ~~LOC~~ IMPL
3.6000 mg | DRUG_IMPLANT | Freq: Once | SUBCUTANEOUS | Status: AC
  Administered 2022-09-02: 3.6 mg via SUBCUTANEOUS

## 2022-09-02 NOTE — Patient Instructions (Signed)
Goserelin Implant What is this medication? GOSERELIN (GOE se rel in) treats prostate cancer and breast cancer. It works by decreasing levels of the hormones testosterone and estrogen in the body. This prevents prostate and breast cancer cells from spreading or growing. It may also be used to treat endometriosis. This is a condition where the tissue that lines the uterus grows outside the uterus. It works by decreasing the amount of estrogen your body makes, which reduces heavy bleeding and pain. It can also be used to help thin the lining of the uterus before a surgery used to prevent or reduce heavy periods. This medicine may be used for other purposes; ask your health care provider or pharmacist if you have questions. COMMON BRAND NAME(S): Zoladex, Zoladex 3-Month What should I tell my care team before I take this medication? They need to know if you have any of these conditions: Bone problems Diabetes Heart disease History of irregular heartbeat or rhythm An unusual or allergic reaction to goserelin, other medications, foods, dyes, or preservatives Pregnant or trying to get pregnant Breastfeeding How should I use this medication? This medication is injected under the skin. It is given by your care team in a hospital or clinic setting. Talk to your care team about the use of this medication in children. Special care may be needed. Overdosage: If you think you have taken too much of this medicine contact a poison control center or emergency room at once. NOTE: This medicine is only for you. Do not share this medicine with others. What if I miss a dose? Keep appointments for follow-up doses. It is important not to miss your dose. Call your care team if you are unable to keep an appointment. What may interact with this medication? Do not take this medication with any of the following: Cisapride Dronedarone Pimozide Thioridazine This medication may also interact with the following: Other  medications that cause heart rhythm changes This list may not describe all possible interactions. Give your health care provider a list of all the medicines, herbs, non-prescription drugs, or dietary supplements you use. Also tell them if you smoke, drink alcohol, or use illegal drugs. Some items may interact with your medicine. What should I watch for while using this medication? Visit your care team for regular checks on your progress. Your symptoms may appear to get worse during the first weeks of this therapy. Tell your care team if your symptoms do not start to get better or if they get worse after this time. Using this medication for a long time may weaken your bones. If you smoke or frequently drink alcohol you may increase your risk of bone loss. A family history of osteoporosis, chronic use of medications for seizures (convulsions), or corticosteroids can also increase your risk of bone loss. The risk of bone fractures may be increased. Talk to your care team about your bone health. This medication may increase blood sugar. The risk may be higher in patients who already have diabetes. Ask your care team what you can do to lower your risk of diabetes while taking this medication. This medication should stop regular monthly menstruation in women. Tell your care team if you continue to menstruate. Talk to your care team if you wish to become pregnant or think you might be pregnant. This medication can cause serious birth defects if taken during pregnancy or for 12 weeks after stopping treatment. Talk to your care team about reliable forms of contraception. Do not breastfeed while taking this   medication. This medication may cause infertility. Talk to your care team if you are concerned about your fertility. What side effects may I notice from receiving this medication? Side effects that you should report to your care team as soon as possible: Allergic reactions--skin rash, itching, hives, swelling  of the face, lips, tongue, or throat Change in the amount of urine Heart attack--pain or tightness in the chest, shoulders, arms, or jaw, nausea, shortness of breath, cold or clammy skin, feeling faint or lightheaded Heart rhythm changes--fast or irregular heartbeat, dizziness, feeling faint or lightheaded, chest pain, trouble breathing High blood sugar (hyperglycemia)--increased thirst or amount of urine, unusual weakness or fatigue, blurry vision High calcium level--increased thirst or amount of urine, nausea, vomiting, confusion, unusual weakness or fatigue, bone pain Pain, redness, irritation, or bruising at the injection site Severe back pain, numbness or weakness of the hands, arms, legs, or feet, loss of coordination, loss of bowel or bladder control Stroke--sudden numbness or weakness of the face, arm, or leg, trouble speaking, confusion, trouble walking, loss of balance or coordination, dizziness, severe headache, change in vision Swelling and pain of the tumor site or lymph nodes Trouble passing urine Side effects that usually do not require medical attention (report to your care team if they continue or are bothersome): Change in sex drive or performance Headache Hot flashes Rapid or extreme change in emotion or mood Sweating Swelling of the ankles, hands, or feet Unusual vaginal discharge, itching, or odor This list may not describe all possible side effects. Call your doctor for medical advice about side effects. You may report side effects to FDA at 1-800-FDA-1088. Where should I keep my medication? This medication is given in a hospital or clinic. It will not be stored at home. NOTE: This sheet is a summary. It may not cover all possible information. If you have questions about this medicine, talk to your doctor, pharmacist, or health care provider.  2023 Elsevier/Gold Standard (2021-09-15 00:00:00)  

## 2022-09-02 NOTE — Telephone Encounter (Signed)
Patient called to see if she could get her injections on Fridays since she gets off work early, I wasn't able to accommodate the next injection but added her June injection for Friday, she is aware of dates and times.

## 2022-09-09 ENCOUNTER — Inpatient Hospital Stay: Payer: 59

## 2022-09-23 ENCOUNTER — Inpatient Hospital Stay: Payer: 59

## 2022-09-23 ENCOUNTER — Inpatient Hospital Stay: Payer: 59 | Admitting: Hematology and Oncology

## 2022-09-30 LAB — HM DIABETES EYE EXAM

## 2022-10-03 ENCOUNTER — Inpatient Hospital Stay: Payer: 59

## 2022-10-03 ENCOUNTER — Inpatient Hospital Stay: Payer: 59 | Admitting: Hematology and Oncology

## 2022-10-03 ENCOUNTER — Inpatient Hospital Stay: Payer: 59 | Attending: Oncology

## 2022-10-03 ENCOUNTER — Inpatient Hospital Stay (HOSPITAL_BASED_OUTPATIENT_CLINIC_OR_DEPARTMENT_OTHER): Payer: 59 | Admitting: Hematology and Oncology

## 2022-10-03 VITALS — BP 125/85 | HR 85 | Temp 98.1°F | Resp 15 | Wt 176.0 lb

## 2022-10-03 DIAGNOSIS — C50511 Malignant neoplasm of lower-outer quadrant of right female breast: Secondary | ICD-10-CM

## 2022-10-03 DIAGNOSIS — Z801 Family history of malignant neoplasm of trachea, bronchus and lung: Secondary | ICD-10-CM | POA: Diagnosis not present

## 2022-10-03 DIAGNOSIS — Z8249 Family history of ischemic heart disease and other diseases of the circulatory system: Secondary | ICD-10-CM | POA: Insufficient documentation

## 2022-10-03 DIAGNOSIS — Z9011 Acquired absence of right breast and nipple: Secondary | ICD-10-CM | POA: Diagnosis not present

## 2022-10-03 DIAGNOSIS — C50919 Malignant neoplasm of unspecified site of unspecified female breast: Secondary | ICD-10-CM | POA: Diagnosis not present

## 2022-10-03 DIAGNOSIS — Z79810 Long term (current) use of selective estrogen receptor modulators (SERMs): Secondary | ICD-10-CM | POA: Insufficient documentation

## 2022-10-03 DIAGNOSIS — Z803 Family history of malignant neoplasm of breast: Secondary | ICD-10-CM | POA: Insufficient documentation

## 2022-10-03 DIAGNOSIS — Z5111 Encounter for antineoplastic chemotherapy: Secondary | ICD-10-CM | POA: Diagnosis not present

## 2022-10-03 DIAGNOSIS — Z800 Family history of malignant neoplasm of digestive organs: Secondary | ICD-10-CM | POA: Insufficient documentation

## 2022-10-03 DIAGNOSIS — Z8349 Family history of other endocrine, nutritional and metabolic diseases: Secondary | ICD-10-CM | POA: Diagnosis not present

## 2022-10-03 DIAGNOSIS — Z833 Family history of diabetes mellitus: Secondary | ICD-10-CM | POA: Diagnosis not present

## 2022-10-03 DIAGNOSIS — Z170 Estrogen receptor positive status [ER+]: Secondary | ICD-10-CM | POA: Insufficient documentation

## 2022-10-03 DIAGNOSIS — Z923 Personal history of irradiation: Secondary | ICD-10-CM | POA: Insufficient documentation

## 2022-10-03 LAB — CMP (CANCER CENTER ONLY)
ALT: 30 U/L (ref 0–44)
AST: 41 U/L (ref 15–41)
Albumin: 4.1 g/dL (ref 3.5–5.0)
Alkaline Phosphatase: 60 U/L (ref 38–126)
Anion gap: 10 (ref 5–15)
BUN: 14 mg/dL (ref 6–20)
CO2: 26 mmol/L (ref 22–32)
Calcium: 9.1 mg/dL (ref 8.9–10.3)
Chloride: 105 mmol/L (ref 98–111)
Creatinine: 0.7 mg/dL (ref 0.44–1.00)
GFR, Estimated: >60 mL/min (ref >60)
Glucose, Bld: 149 mg/dL — ABNORMAL HIGH (ref 70–99)
Potassium: 4 mmol/L (ref 3.5–5.1)
Sodium: 141 mmol/L (ref 135–145)
Total Bilirubin: 0.5 mg/dL (ref 0.3–1.2)
Total Protein: 7.1 g/dL (ref 6.5–8.1)

## 2022-10-03 LAB — CBC WITH DIFFERENTIAL (CANCER CENTER ONLY)
Abs Immature Granulocytes: 0.03 10*3/uL (ref 0.00–0.07)
Basophils Absolute: 0 10*3/uL (ref 0.0–0.1)
Basophils Relative: 0 %
Eosinophils Absolute: 0.2 10*3/uL (ref 0.0–0.5)
Eosinophils Relative: 1 %
HCT: 38.6 % (ref 36.0–46.0)
Hemoglobin: 13.6 g/dL (ref 12.0–15.0)
Immature Granulocytes: 0 %
Lymphocytes Relative: 32 %
Lymphs Abs: 3.5 10*3/uL (ref 0.7–4.0)
MCH: 31.2 pg (ref 26.0–34.0)
MCHC: 35.2 g/dL (ref 30.0–36.0)
MCV: 88.5 fL (ref 80.0–100.0)
Monocytes Absolute: 0.5 10*3/uL (ref 0.1–1.0)
Monocytes Relative: 5 %
Neutro Abs: 6.7 10*3/uL (ref 1.7–7.7)
Neutrophils Relative %: 62 %
Platelet Count: 235 10*3/uL (ref 150–400)
RBC: 4.36 MIL/uL (ref 3.87–5.11)
RDW: 13.2 % (ref 11.5–15.5)
WBC Count: 11 10*3/uL — ABNORMAL HIGH (ref 4.0–10.5)
nRBC: 0 % (ref 0.0–0.2)

## 2022-10-03 MED ORDER — GOSERELIN ACETATE 3.6 MG ~~LOC~~ IMPL
3.6000 mg | DRUG_IMPLANT | Freq: Once | SUBCUTANEOUS | Status: AC
  Administered 2022-10-03: 3.6 mg via SUBCUTANEOUS
  Filled 2022-10-03: qty 3.6

## 2022-10-03 NOTE — Progress Notes (Signed)
[facility]  Telephone:([phone] Fax:(336) [phone]     ID: MADALINE MULATO DOB: June 22, 1970  MR#: [id number]  JYN#:[id number]  Patient Care Team: Joaquim Nam, MD as PCP - General (Family Medicine) Pershing Proud, RN as Oncology Nurse Navigator Donnelly Angelica, RN as Oncology Nurse Navigator Griselda Miner, MD as Consulting Physician (General Surgery) Dillingham, Alena Bills, DO as Attending Physician (Plastic Surgery) Rachel Moulds, MD OTHER MD:  CHIEF COMPLAINT: Estrogen receptor positive breast cancer (s/p right mastectomy)  CURRENT TREATMENT: tamoxifen, goserelin  INTERVAL HISTORY:  Kayal "Angie" returns today for follow up of her estrogen receptor positive breast cancer.  She is on zoladex and tamoxifen for anti estrogen therapy. She is tolerating this combination well. Since last visit, she has no new complaints.  She was wondering if she needs to continue intensified screening with MRIs alternating with mammograms.  Her last mammogram was in February 2023.  She is not completely satisfied with the reconstruction but is not really willing to undergo more surgeries at this time. Rest of the pertinent 10 point ROS reviewed and negative  REVIEW OF SYSTEMS:   COVID 19 VACCINATION STATUS: Not vaccinated as of December 53   HISTORY OF CURRENT ILLNESS: From the original visit note:  DAYATRA RIEDELL herself palpated a right breast mass. She underwent bilateral diagnostic mammography with tomography and right breast ultrasonography at [facility] on 01/15/2020 showing: breast density category B; palpable 1.6 cm mass in right breast at 5:30, with adjacent 5 mm probable satellite mass; 0.9 cm mass in retroareolar right breast, also at 5:30; two enlarged lymph nodes in right breast at 9:30.  Accordingly on 01/17/2020 she proceeded to biopsy of the right breast areas in question. The pathology from this procedure ([id number]) showed:  1. Right Breast,  5:30  - invasive mammary carcinoma, grade 2  - ductal carcinoma in situ, low grade.   - Prognostic indicators significant for: estrogen receptor, 95% positive and progesterone receptor, 95% positive, both with strong staining intensity. Proliferation marker Ki67 not obtained. HER2 negative by immunohistochemistry (0). 2. Right Breast, retroareolar  - ductal carcinoma in situ 3. Lymph Node, right axilla  - invasive mammary carcinoma  - lymph node tissue not identified  We do not have an e-cadherin or Ki67 on this tissue.  The patient's subsequent history is as detailed below.   PAST MEDICAL HISTORY: Past Medical History:  Diagnosis Date   Anxiety    h/o, was prev on zoloft   Breast cancer (HCC) 01/2020   seeing Dr. Carolynne Edouard   Cholelithiasis    Diabetes mellitus without complication [facility])    Family history of breast cancer    Family history of leukemia    Family history of lung cancer    Family history of prostate cancer    Family history of stomach cancer    Hypothyroidism    postpartum hypothyroidism   IUD (intrauterine device) in place    mirena per [facility]   Major depression     PAST SURGICAL HISTORY: Past Surgical History:  Procedure Laterality Date   BREAST RECONSTRUCTION WITH PLACEMENT OF TISSUE EXPANDER AND FLEX HD (ACELLULAR HYDRATED DERMIS) Right 03/30/2020   Procedure: BREAST RECONSTRUCTION WITH PLACEMENT OF TISSUE EXPANDER AND FLEX HD (ACELLULAR HYDRATED DERMIS);  Surgeon: Peggye Form, DO;  Location: [facility];  Service: Plastics;  Laterality: Right;   BREAST REDUCTION WITH MASTOPEXY Left 05/28/2020   Procedure: BREAST REDUCTION WITH MASTOPEXY;  Surgeon: Ulice Bold,  Alena Bills, DO;  Location: [facility];  Service: Plastics;  Laterality: Left;   CESAREAN SECTION     X 2 Fetal stress, second scheduled   CHOLECYSTECTOMY  05/1998   LIPOSUCTION WITH LIPOFILLING Right 07/21/2021   Procedure: LIPOSUCTION WITH LIPOFILLING OF RIGHT  BREAST;  Surgeon: Peggye Form, DO;  Location: [facility];  Service: Plastics;  Laterality: Right;   MASTECTOMY MODIFIED RADICAL Right 03/30/2020   Procedure: RIGHT MASTECTOMY MODIFIED RADICAL;  Surgeon: Griselda Miner, MD;  Location: [facility];  Service: General;  Laterality: Right;  PEC BLOCK, RNFA   REMOVAL OF TISSUE EXPANDER AND PLACEMENT OF IMPLANT Right 05/28/2020   Procedure: REMOVAL OF TISSUE EXPANDER AND PLACEMENT OF IMPLANT;  Surgeon: Peggye Form, DO;  Location: [facility];  Service: Plastics;  Laterality: Right;  2.5 hours, please    FAMILY HISTORY: Family History  Problem Relation Age of Onset   Hypertension Father    Depression Father    Prostate cancer Father 81       Prostate, S/P prostatectomy, metastatic   Asthma Sister    Spina bifida Sister    Hypothyroidism Mother    Hypertension Maternal Grandmother    Hyperlipidemia Maternal Grandmother    Lung disease Maternal Grandmother    Lung cancer Maternal Grandmother 92   Hypothyroidism Maternal Grandfather    Prostate cancer Maternal Grandfather    Cancer Maternal Grandfather        lung, stomach, and prostate cancers   Emphysema Paternal Grandmother    Diabetes Maternal Uncle    Diabetes Maternal Uncle    Leukemia Maternal Uncle 54   Prostate cancer Maternal Uncle        dx in his 60s/70s, metastatic   Breast cancer Other 45       negative genetic testing; maternal first cousin   Stroke Neg Hx    Colon cancer Neg Hx    The patient's father died at age 23 from prostate cancer.  The patient's mother is 60 years old as of September 2021.  The patient has 1 uncle with pancreatic cancer and one cousin on the same side of the family with breast cancer.   GYNECOLOGIC HISTORY:  No LMP recorded. Patient is postmenopausal. Menarche: 52 years old Age at first live birth: 52 years old GX P 2 Contraceptive Mirena in place Hysterectomy?  No BSO?   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  She is divorced.  She lives alone although her significant other Aris Everts (who is disabled secondary to COPD) sometimes comes over.  Her sons are Gay Filler (24), who is studying dentistry at [employer], and Hooper (21), who is also at [employer] considering dentistry or physical therapy.   ADVANCED DIRECTIVES: Not in place.  The patient intends to name her older son as her healthcare power of attorney and the appropriate documents were giving her on 01/29/2020 for her to complete and notarize at her discretion   HEALTH MAINTENANCE: Social History   Tobacco Use   Smoking status: Never   Smokeless tobacco: Never  Vaping Use   Vaping Use: Never used  Substance Use Topics   Alcohol use: Yes    Alcohol/week: 0.0 standard drinks of alcohol    Comment: Very rarely.   Drug use: No     Colonoscopy:   PAP: Not up-to-date  Bone density: Never  Allergies  Allergen Reactions   Codeine Nausea And Vomiting   Zoloft [Sertraline Hcl]     headaches    Current Outpatient Medications  Medication Sig Dispense Refill   acyclovir ointment (ZOVIRAX) 5 % Apply 1 application topically every 3 (three) hours. As needed. 5 g 5   atorvastatin (LIPITOR) 10 MG tablet Take 1 tablet (10 mg total) by mouth daily. 90 tablet 3   cholecalciferol (VITAMIN D3) 25 MCG (1000 UNIT) tablet Take 1,000 Units by mouth daily.     cyclobenzaprine (FLEXERIL) 10 MG tablet TAKE 1/2 TO 1 TABLET BY MOUTH EVERY DAY AS NEEDED 30 tablet 5   escitalopram (LEXAPRO) 10 MG tablet TAKE 1 TABLET BY MOUTH EVERY DAY 90 tablet 2   goserelin (ZOLADEX) 3.6 MG injection Inject 3.6 mg into the skin every 28 (twenty-eight) days.     ibuprofen (ADVIL,MOTRIN) 200 MG tablet Take 200 mg by mouth every 6 (six) hours as needed.     levothyroxine (SYNTHROID) 50 MCG tablet Take 1 tablet (50 mcg total) by mouth  daily before breakfast. 90 tablet 1   metFORMIN (GLUCOPHAGE) 500 MG tablet Take 2 tablets (1,000 mg total) by mouth 2 (two) times daily with a meal.     tamoxifen (NOLVADEX) 20 MG tablet Take 1 tablet (20 mg total) by mouth daily. 90 tablet 2   Turmeric (QC TUMERIC COMPLEX PO) Take by mouth.     valACYclovir (VALTREX) 1000 MG tablet TAKE 2 TABLETS (2,000 MG TOTAL) BY MOUTH 2 (TWO) TIMES DAILY AS NEEDED. FOR 1 DAY. 20 tablet 5   No current facility-administered medications for this visit.    OBJECTIVE: White woman who appears well  Vitals:   10/03/22 1326  BP: 125/85  Pulse: 85  Resp: 15  Temp: 98.1 F (36.7 C)  SpO2: 96%     Body mass index is 31.18 kg/m.   Wt Readings from Last 3 Encounters:  10/03/22 176 lb (79.8 kg)  07/15/22 175 lb (79.4 kg)  06/21/22 177 lb (80.3 kg)     ECOG FS:1 - Symptomatic but completely ambulatory  Physical Exam Constitutional:      Appearance: Normal appearance.  Chest:     Comments: Right sided mastectomy with reconstruction.  No palpable masses or regional adenopathy.  Left breast normal to inspection and palpation.  No regional adenopathy. Musculoskeletal:        General: No swelling.     Cervical back: Normal range of motion and neck supple. No rigidity or tenderness.  Skin:    General: Skin is warm and dry.  Neurological:     General: No focal deficit present.     Mental Status: She is alert.       LAB RESULTS:  CMP     Component Value Date/Time   NA 140 07/15/2022 1518   K 4.2 07/15/2022 1518   CL 102 07/15/2022 1518   CO2 26 07/15/2022 1518   GLUCOSE 145 (H) 07/15/2022 1518   BUN 15 07/15/2022 1518   CREATININE 0.71 07/15/2022 1518   CALCIUM 10.2 07/15/2022 1518   PROT 7.5 07/15/2022 1518   ALBUMIN 4.1 04/06/2022 1305   AST 34 07/15/2022 1518   AST 41 04/06/2022 1305   ALT 33 (H) 07/15/2022 1518   ALT 33 04/06/2022 1305   ALKPHOS 52 04/06/2022 1305   BILITOT 0.4 07/15/2022 1518   BILITOT 0.3 04/06/2022 1305    GFRNONAA >60 04/06/2022 1305   GFRAA >60 01/29/2020 1526    No results found  for: "TOTALPROTELP", "ALBUMINELP", "A1GS", "A2GS", "BETS", "BETA2SER", "GAMS", "MSPIKE", "SPEI"  Lab Results  Component Value Date   WBC 11.0 (H) 10/03/2022   NEUTROABS 6.7 10/03/2022   HGB 13.6 10/03/2022   HCT 38.6 10/03/2022   MCV 88.5 10/03/2022   PLT 235 10/03/2022    No results found for: "LABCA2"  No components found for: "RUEAVW098"  No results for input(s): "INR" in the last 168 hours.  No results found for: "LABCA2"  No results found for: "JXB147"  No results found for: "CAN125"  No results found for: "CAN153"  No results found for: "CA2729"  No components found for: "HGQUANT"  No results found for: "CEA1", "CEA" / No results found for: "CEA1", "CEA"   No results found for: "AFPTUMOR"  No results found for: "CHROMOGRNA"  No results found for: "KPAFRELGTCHN", "LAMBDASER", "KAPLAMBRATIO" (kappa/lambda light chains)  No results found for: "HGBA", "HGBA2QUANT", "HGBFQUANT", "HGBSQUAN" (Hemoglobinopathy evaluation)   No results found for: "LDH"  No results found for: "IRON", "TIBC", "IRONPCTSAT" (Iron and TIBC)  No results found for: "FERRITIN"  Urinalysis    Component Value Date/Time   BILIRUBINUR negative 02/25/2015 1645   PROTEINUR + 0.15 02/25/2015 1645   UROBILINOGEN 0.2 02/25/2015 1645   NITRITE negative 02/25/2015 1645   LEUKOCYTESUR small (1+) (A) 02/25/2015 1645    STUDIES: No results found.   ELIGIBLE FOR AVAILABLE RESEARCH PROTOCOL: AET  ASSESSMENT:   52 y.o. [place] woman status post right breast lower outer quadrant biopsy 01/17/2020  for a clinical T1c N1, stage IB invasive mammary carcinoma, grade 2, estrogen and progesterone receptor positive, HER-2 not amplified  (a) biopsy of the left axillary "lymph node" was positive but included no lymph node tissue  (b) biopsy of a suspicious periareoral area was positive for ductal carcinoma in situ,  grade 1  (c) biopsy of a left breast LOQ mass noted on 02/06/2020   (d) CT chest and bone scan 02/11/2020 show no evidence of metastatic disease  (1) genetics testing 02/15/2020 through the [facility] Common Hereditary Cancers Panel.  Found no deleterious mutations in APC, ATM, AXIN2, BARD1, BMPR1A, BRCA1, BRCA2, BRIP1, CDH1, CDK4, CDKN2A (p14ARF), CDKN2A (p16INK4a), CHEK2, CTNNA1, DICER1, EPCAM (Deletion/duplication testing only), GREM1 (promoter region deletion/duplication testing only), KIT, MEN1, MLH1, MSH2, MSH3, MSH6, MUTYH, NBN, NF1, NHTL1, PALB2, PDGFRA, PMS2, POLD1, POLE, PTEN, RAD50, RAD51C, RAD51D, RNF43, SDHB, SDHC, SDHD, SMAD4, SMARCA4. STK11, TP53, TSC1, TSC2, and VHL.  The following genes were evaluated for sequence changes only: SDHA and HOXB13 c.251G>A variant only.   (2) MammaPrint obtained from the 01/17/2020 biopsy read as "low risk" predicting a 93% chance of distant disease free survival at 5 years without the need for chemotherapy  (3) tamoxifen started 02/13/2020 in anticipation of surgical delays  (a) started ovarian suppression 04/30/2020 with monthly goserelin/Zoladex  (4) status post right modified radical mastectomy 03/30/2020 for a pT3 pN1, stage IIA invasive ductal carcinoma, grade 2, with evidence of lymphovascular space involvement but negative margins  (a) a total of 9 right axillary lymph nodes removed, 2 positive with focal extracapsular extension  (b) status post right breast reconstruction 05/28/2020 with a [employer]or Smooth Round Ultra High Profile Gel 650 cc. Ref #[id number].  Serial Number [id number]; left mastopexy same date  (5) adjuvant radiation completed 08/25/2020   PLAN:  Ms. Nelya is here for follow-up.  She is now on tamoxifen and Zoladex.  She is tolerating this combination extremely well.  No concerns on breast exam, postradiation changes in the right breast.  No  other symptoms today of any concern.  She is not entirely satisfied with the cosmetic  outcome but is not very keen to do more procedures.  At this time she is overdue for mammogram, encouraged her to schedule this. We can try adding LH, FSH and estradiol to next of labs. For weight loss, encouraged small meals, drinking adequate amount of water and walking 10,000 steps daily. She was encouraged to call us sooner with any new questions or concerns.  Otherwise we will plan to see her in about 6 months.  Total encounter time 30 minutes.**Total Encounter Time as defined by the Centers for Medicare and Medicaid Services includes, in addition to the face-to-face time of a patient visit (documented in the note above) non-face-to-face time: obtaining and reviewing outside history, ordering and reviewing medications, tests or procedures, care coordination (communications with other health care professionals or caregivers) and documentation in the medical record.

## 2022-10-21 ENCOUNTER — Telehealth: Payer: Self-pay

## 2022-10-21 NOTE — Telephone Encounter (Signed)
Pt calling; is concerned about side effects of medication during Tamoxifen and zoladex putting her into menopause.  [phone]

## 2022-10-23 ENCOUNTER — Other Ambulatory Visit: Payer: Self-pay | Admitting: Family Medicine

## 2022-10-23 DIAGNOSIS — E039 Hypothyroidism, unspecified: Secondary | ICD-10-CM

## 2022-10-23 NOTE — Telephone Encounter (Signed)
Responded via MyChart message.

## 2022-10-31 ENCOUNTER — Ambulatory Visit
Admission: RE | Admit: 2022-10-31 | Discharge: 2022-10-31 | Disposition: A | Discharge status: Home / Self Care | Payer: 59 | Source: Ambulatory Visit | Attending: Hematology and Oncology | Admitting: Hematology and Oncology

## 2022-10-31 DIAGNOSIS — N641 Fat necrosis of breast: Secondary | ICD-10-CM | POA: Diagnosis not present

## 2022-10-31 DIAGNOSIS — Z853 Personal history of malignant neoplasm of breast: Secondary | ICD-10-CM | POA: Diagnosis not present

## 2022-10-31 DIAGNOSIS — C50511 Malignant neoplasm of lower-outer quadrant of right female breast: Secondary | ICD-10-CM

## 2022-10-31 DIAGNOSIS — Z9011 Acquired absence of right breast and nipple: Secondary | ICD-10-CM | POA: Diagnosis not present

## 2022-11-01 ENCOUNTER — Telehealth: Payer: Self-pay

## 2022-11-01 ENCOUNTER — Encounter: Payer: Self-pay | Admitting: Hematology and Oncology

## 2022-11-01 NOTE — Telephone Encounter (Signed)
Called and LVM regarding MyChart message. Asked for call back. 

## 2022-11-04 ENCOUNTER — Inpatient Hospital Stay: Payer: 59

## 2022-11-04 ENCOUNTER — Ambulatory Visit: Payer: 59

## 2022-11-04 ENCOUNTER — Inpatient Hospital Stay: Payer: 59 | Admitting: Hematology and Oncology

## 2022-11-04 ENCOUNTER — Other Ambulatory Visit: Payer: 59

## 2022-11-04 ENCOUNTER — Ambulatory Visit: Payer: 59 | Admitting: Hematology and Oncology

## 2022-11-10 ENCOUNTER — Encounter: Payer: Self-pay | Admitting: Family Medicine

## 2022-11-17 ENCOUNTER — Other Ambulatory Visit: Payer: Self-pay | Admitting: Family Medicine

## 2022-11-17 DIAGNOSIS — E119 Type 2 diabetes mellitus without complications: Secondary | ICD-10-CM

## 2022-11-18 ENCOUNTER — Other Ambulatory Visit: Payer: 59

## 2022-11-18 ENCOUNTER — Other Ambulatory Visit (INDEPENDENT_AMBULATORY_CARE_PROVIDER_SITE_OTHER): Payer: 59

## 2022-11-18 DIAGNOSIS — E119 Type 2 diabetes mellitus without complications: Secondary | ICD-10-CM

## 2022-11-18 DIAGNOSIS — C50919 Malignant neoplasm of unspecified site of unspecified female breast: Secondary | ICD-10-CM

## 2022-11-18 LAB — LIPID PANEL
Cholesterol: 114 mg/dL (ref 0–200)
HDL: 43.2 mg/dL (ref >39.00)
LDL Cholesterol: 39 mg/dL (ref 0–99)
NonHDL: 70.76
Total CHOL/HDL Ratio: 3
Triglycerides: 160 mg/dL — ABNORMAL HIGH (ref 0.0–149.0)
VLDL: 32 mg/dL (ref 0.0–40.0)

## 2022-11-18 LAB — HEMOGLOBIN A1C: Hgb A1c MFr Bld: 7.8 % — ABNORMAL HIGH (ref 4.6–6.5)

## 2022-11-18 NOTE — Addendum Note (Signed)
Addended by: Alvina Chou on: 11/18/2022 08:45 AM   Modules accepted: Orders

## 2022-11-25 ENCOUNTER — Ambulatory Visit: Payer: 59 | Admitting: Family Medicine

## 2022-11-25 ENCOUNTER — Other Ambulatory Visit: Payer: Self-pay | Admitting: Family Medicine

## 2022-11-25 ENCOUNTER — Encounter: Payer: Self-pay | Admitting: Family Medicine

## 2022-11-25 VITALS — BP 118/82 | HR 90 | Temp 97.7°F | Ht 63.0 in | Wt 173.0 lb

## 2022-11-25 DIAGNOSIS — E785 Hyperlipidemia, unspecified: Secondary | ICD-10-CM | POA: Diagnosis not present

## 2022-11-25 DIAGNOSIS — R809 Proteinuria, unspecified: Secondary | ICD-10-CM | POA: Diagnosis not present

## 2022-11-25 DIAGNOSIS — E1129 Type 2 diabetes mellitus with other diabetic kidney complication: Secondary | ICD-10-CM | POA: Diagnosis not present

## 2022-11-25 MED ORDER — TIRZEPATIDE 2.5 MG/0.5ML ~~LOC~~ SOAJ: 2.5000 mg | SUBCUTANEOUS | 0 refills | Status: DC

## 2022-11-25 MED ORDER — TIRZEPATIDE 5 MG/0.5ML ~~LOC~~ SOAJ: 5.0000 mg | SUBCUTANEOUS | 5 refills | Status: DC

## 2022-11-25 MED ORDER — METFORMIN HCL 500 MG PO TABS: ORAL_TABLET | ORAL | Status: DC

## 2022-11-25 NOTE — Patient Instructions (Addendum)
If you can't get mounjaro filled, then let me know.  Try 2.5mg  weekly for 4 week, then 5mg  if tolerated.  Recheck A1c in about 3 months at a visit.  Update me as needed in the meantime.  Take care.  Glad to see you.

## 2022-11-25 NOTE — Progress Notes (Unsigned)
Diabetes:  Using medications without difficulties: max tolerated 1500mg  metformin per day.  GI upset with 4 tabs per day.  Hypoglycemic episodes: no sx if she manages her intake, avoids prolonged fasting.   Hyperglycemic episodes: no sx Feet problems: no Blood Sugars averaging: not checked.   eye exam within last year: yes Labs d/w pt.    Walking/swimming for exercise, working on diet.   Elevated Cholesterol: Using medications without problems: yes Muscle aches: no Diet compliance: d/w pt.  Exercise: d/w pt.    Meds, vitals, and allergies reviewed.  ROS: Per HPI unless specifically indicated in ROS section   GEN: nad, alert and oriented HEENT: ncat NECK: supple w/o LA CV: rrr. PULM: ctab, no inc wob ABD: soft, +bs EXT: no edema SKIN: no acute rash  Diabetic foot exam: Normal inspection No skin breakdown No calluses  Normal DP pulses Normal sensation to light touch and monofilament Nails normal  25 minutes were devoted to patient care in this encounter (this includes time spent reviewing the patient's file/history, interviewing and examining the patient, counseling/reviewing plan with patient).

## 2022-11-25 NOTE — Telephone Encounter (Signed)
Christy Ferrell is not covered by insurance; victoza and trulicity are preferred alternatives. Would you like to change rx or have PA done?

## 2022-11-26 NOTE — Telephone Encounter (Signed)
Notify pt.  Trulicity may be covered, Mounjaro isn't covered.  Would try Trulicity , rx sent.  Let me know if not tolerated/covered/available.  Thanks.

## 2022-11-27 DIAGNOSIS — E785 Hyperlipidemia, unspecified: Secondary | ICD-10-CM | POA: Insufficient documentation

## 2022-11-27 NOTE — Assessment & Plan Note (Signed)
Continue to her statin.  Continue work on diet and exercise.

## 2022-11-27 NOTE — Assessment & Plan Note (Signed)
max tolerated 1500mg  metformin per day.  GI upset with 4 tabs per day.  Discussed adding on Mounjaro.  Routine cautions given to patient.  See following notes regarding prescribing.  Diet and exercise discussed with patient.  She is taking atorvastatin in the meantime and tolerating that.  Recheck periodically.

## 2022-11-29 NOTE — Telephone Encounter (Signed)
Left message on patients VM about rx change.

## 2022-12-02 ENCOUNTER — Inpatient Hospital Stay: Payer: 59 | Attending: Oncology | Admitting: Hematology and Oncology

## 2022-12-02 ENCOUNTER — Other Ambulatory Visit: Payer: Self-pay

## 2022-12-02 ENCOUNTER — Inpatient Hospital Stay: Payer: 59

## 2022-12-02 ENCOUNTER — Encounter: Payer: Self-pay | Admitting: Hematology and Oncology

## 2022-12-02 VITALS — BP 115/81 | HR 73 | Temp 97.2°F | Resp 20 | Wt 173.0 lb

## 2022-12-02 DIAGNOSIS — Z170 Estrogen receptor positive status [ER+]: Secondary | ICD-10-CM | POA: Diagnosis not present

## 2022-12-02 DIAGNOSIS — Z803 Family history of malignant neoplasm of breast: Secondary | ICD-10-CM | POA: Diagnosis not present

## 2022-12-02 DIAGNOSIS — Z800 Family history of malignant neoplasm of digestive organs: Secondary | ICD-10-CM | POA: Diagnosis not present

## 2022-12-02 DIAGNOSIS — Z806 Family history of leukemia: Secondary | ICD-10-CM | POA: Diagnosis not present

## 2022-12-02 DIAGNOSIS — C50511 Malignant neoplasm of lower-outer quadrant of right female breast: Secondary | ICD-10-CM | POA: Insufficient documentation

## 2022-12-02 DIAGNOSIS — Z801 Family history of malignant neoplasm of trachea, bronchus and lung: Secondary | ICD-10-CM | POA: Diagnosis not present

## 2022-12-02 DIAGNOSIS — Z923 Personal history of irradiation: Secondary | ICD-10-CM | POA: Diagnosis not present

## 2022-12-02 DIAGNOSIS — Z9011 Acquired absence of right breast and nipple: Secondary | ICD-10-CM | POA: Insufficient documentation

## 2022-12-02 DIAGNOSIS — Z79810 Long term (current) use of selective estrogen receptor modulators (SERMs): Secondary | ICD-10-CM | POA: Insufficient documentation

## 2022-12-02 DIAGNOSIS — C50919 Malignant neoplasm of unspecified site of unspecified female breast: Secondary | ICD-10-CM

## 2022-12-02 NOTE — Progress Notes (Signed)
[facility]  Telephone:([phone]) [phone] Fax:(336) [phone]     ID: ANWYN KRIEGEL DOB: Sep 23, 1970  MR#: [id number]  OZD#:[id number]  Patient Care Team: Joaquim Nam, MD as PCP - General (Family Medicine) Pershing Proud, RN as Oncology Nurse Navigator Donnelly Angelica, RN as Oncology Nurse Navigator Griselda Miner, MD as Consulting Physician (General Surgery) Dillingham, Alena Bills, DO as Attending Physician (Plastic Surgery) Rachel Moulds, MD OTHER MD:  CHIEF COMPLAINT: Estrogen receptor positive breast cancer (s/p right mastectomy)  CURRENT TREATMENT: tamoxifen, goserelin  INTERVAL HISTORY:  Christy "Christy Ferrell" returns today for follow up of her estrogen receptor positive breast cancer.  She is on zoladex and tamoxifen for anti estrogen therapy. Zoladex is on hold since May We are checking labs today to look at the menopausal status so we can omit tamoxifen. She denies any adverse effects with tamoxifen. No change in the breast No change in breathing, bowel habits or urinary habits. No new neurological complaints. She will be going to [place] in August for her son's wedding. Rest of the pertinent 10 point ROS reviewed and negative  REVIEW OF SYSTEMS:   COVID 19 VACCINATION STATUS: Not vaccinated as of December 2022   HISTORY OF CURRENT ILLNESS: From the original visit note:  Christy Ferrell herself palpated a right breast mass. She underwent bilateral diagnostic mammography with tomography and right breast ultrasonography at [facility] on 01/15/2020 showing: breast density category B; palpable 1.6 cm mass in right breast at 5:30, with adjacent 5 mm probable satellite mass; 0.9 cm mass in retroareolar right breast, also at 5:30; two enlarged lymph nodes in right breast at 9:30.  Accordingly on 01/17/2020 she proceeded to biopsy of the right breast areas in question. The pathology from this procedure ([id number]) showed:  1. Right Breast, 5:30  - invasive  mammary carcinoma, grade 2  - ductal carcinoma in situ, low grade.   - Prognostic indicators significant for: estrogen receptor, 95% positive and progesterone receptor, 95% positive, both with strong staining intensity. Proliferation marker Ki67 not obtained. HER2 negative by immunohistochemistry (0). 2. Right Breast, retroareolar  - ductal carcinoma in situ 3. Lymph Node, right axilla  - invasive mammary carcinoma  - lymph node tissue not identified  We do not have an e-cadherin or Ki67 on this tissue.  The patient's subsequent history is as detailed below.   PAST MEDICAL HISTORY: Past Medical History:  Diagnosis Date   Anxiety    h/o, was prev on zoloft   Breast cancer (HCC) 01/2020   seeing Dr. Carolynne Edouard   Cholelithiasis    Diabetes mellitus without complication [facility])    Family history of breast cancer    Family history of leukemia    Family history of lung cancer    Family history of prostate cancer    Family history of stomach cancer    Hypothyroidism    postpartum hypothyroidism   IUD (intrauterine device) in place    mirena per [facility]   Major depression     PAST SURGICAL HISTORY: Past Surgical History:  Procedure Laterality Date   BREAST RECONSTRUCTION WITH PLACEMENT OF TISSUE EXPANDER AND FLEX HD (ACELLULAR HYDRATED DERMIS) Right 03/30/2020   Procedure: BREAST RECONSTRUCTION WITH PLACEMENT OF TISSUE EXPANDER AND FLEX HD (ACELLULAR HYDRATED DERMIS);  Surgeon: Peggye Form, DO;  Location: [facility];  Service: Plastics;  Laterality: Right;   BREAST REDUCTION WITH MASTOPEXY Left 05/28/2020   Procedure: BREAST REDUCTION WITH MASTOPEXY;  Surgeon: Christy Ferrell,  Alena Bills, DO;  Location: [facility];  Service: Plastics;  Laterality: Left;   CESAREAN SECTION     X 2 Fetal stress, second scheduled   CHOLECYSTECTOMY  05/1998   LIPOSUCTION WITH LIPOFILLING Right 07/21/2021   Procedure: LIPOSUCTION WITH LIPOFILLING OF RIGHT BREAST;  Surgeon:  Peggye Form, DO;  Location: [facility];  Service: Plastics;  Laterality: Right;   MASTECTOMY MODIFIED RADICAL Right 03/30/2020   Procedure: RIGHT MASTECTOMY MODIFIED RADICAL;  Surgeon: Griselda Miner, MD;  Location: [facility];  Service: General;  Laterality: Right;  PEC BLOCK, RNFA   REMOVAL OF TISSUE EXPANDER AND PLACEMENT OF IMPLANT Right 05/28/2020   Procedure: REMOVAL OF TISSUE EXPANDER AND PLACEMENT OF IMPLANT;  Surgeon: Peggye Form, DO;  Location: [facility];  Service: Plastics;  Laterality: Right;  2.5 hours, please    FAMILY HISTORY: Family History  Problem Relation Age of Onset   Hypertension Father    Depression Father    Prostate cancer Father 9       Prostate, S/P prostatectomy, metastatic   Asthma Sister    Spina bifida Sister    Hypothyroidism Mother    Hypertension Maternal Grandmother    Hyperlipidemia Maternal Grandmother    Lung disease Maternal Grandmother    Lung cancer Maternal Grandmother 8   Hypothyroidism Maternal Grandfather    Prostate cancer Maternal Grandfather    Cancer Maternal Grandfather        lung, stomach, and prostate cancers   Emphysema Paternal Grandmother    Diabetes Maternal Uncle    Diabetes Maternal Uncle    Leukemia Maternal Uncle 41   Prostate cancer Maternal Uncle        dx in his 60s/70s, metastatic   Breast cancer Other 45       negative genetic testing; maternal first cousin   Stroke Neg Hx    Colon cancer Neg Hx    The patient's father died at age 26 from prostate cancer.  The patient's mother is 18 years old as of September 2021.  The patient has 1 uncle with pancreatic cancer and one cousin on the same side of the family with breast cancer.   GYNECOLOGIC HISTORY:  No LMP recorded. Patient is postmenopausal. Menarche: 52 years old Age at first live birth: 52 years old GX P 2 Contraceptive Mirena in place Hysterectomy?  No BSO?  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  She is divorced.  She lives alone although her significant other Christy Ferrell (who is disabled secondary to COPD) sometimes comes over.  Her sons are Gay Filler (24), who is studying dentistry at [employer], and [place] (21), who is also at [employer] considering dentistry or physical therapy.   ADVANCED DIRECTIVES: Not in place.  The patient intends to name her older son as her healthcare power of attorney and the appropriate documents were giving her on 01/29/2020 for her to complete and notarize at her discretion   HEALTH MAINTENANCE: Social History   Tobacco Use   Smoking status: Never   Smokeless tobacco: Never  Vaping Use   Vaping status: Never Used  Substance Use Topics   Alcohol use: Yes    Alcohol/week: 0.0 standard drinks of alcohol    Comment: Very rarely.   Drug use: No     Colonoscopy:   PAP: Not up-to-date  Bone density: Never  Allergies  Allergen Reactions   Codeine Nausea And Vomiting   Zoloft [Sertraline Hcl]     headaches    Current Outpatient Medications  Medication Sig Dispense Refill   acyclovir ointment (ZOVIRAX) 5 % Apply 1 application topically every 3 (three) hours. As needed. 5 g 5   atorvastatin (LIPITOR) 10 MG tablet Take 1 tablet (10 mg total) by mouth daily. 90 tablet 3   cholecalciferol (VITAMIN D3) 25 MCG (1000 UNIT) tablet Take 1,000 Units by mouth daily.     cyclobenzaprine (FLEXERIL) 10 MG tablet TAKE 1/2 TO 1 TABLET BY MOUTH EVERY DAY AS NEEDED 30 tablet 5   Dulaglutide (TRULICITY) 0.75 MG/0.5ML SOPN Inject 0.75 mg into the skin once a week. 2 mL 2   escitalopram (LEXAPRO) 10 MG tablet TAKE 1 TABLET BY MOUTH EVERY DAY 90 tablet 2   goserelin (ZOLADEX) 3.6 MG injection Inject 3.6 mg into the skin every 28 (twenty-eight) days. (Patient not taking: Reported on 11/25/2022)     ibuprofen (ADVIL,MOTRIN) 200 MG tablet Take 200 mg by  mouth every 6 (six) hours as needed.     levothyroxine (SYNTHROID) 50 MCG tablet TAKE 1 TABLET BY MOUTH DAILY BEFORE BREAKFAST 30 tablet 5   metFORMIN (GLUCOPHAGE) 500 MG tablet Taking 1 tablet after lunch and 2 tablets at night     tamoxifen (NOLVADEX) 20 MG tablet Take 1 tablet (20 mg total) by mouth daily. 90 tablet 2   Turmeric (QC TUMERIC COMPLEX PO) Take by mouth.     valACYclovir (VALTREX) 1000 MG tablet TAKE 2 TABLETS (2,000 MG TOTAL) BY MOUTH 2 (TWO) TIMES DAILY AS NEEDED. FOR 1 DAY. 20 tablet 5   No current facility-administered medications for this visit.    OBJECTIVE: White woman who appears well  Vitals:   12/02/22 0918  BP: 115/81  Pulse: 73  Resp: 20  Temp: (!) 97.2 F (36.2 C)  SpO2: 96%     Body mass index is 30.65 kg/m.   Wt Readings from Last 3 Encounters:  12/02/22 173 lb (78.5 kg)  11/25/22 173 lb (78.5 kg)  10/03/22 176 lb (79.8 kg)     ECOG FS:1 - Symptomatic but completely ambulatory  Physical Exam Constitutional:      Appearance: Normal appearance.  Chest:     Comments: Right sided mastectomy with reconstruction.  No palpable masses or regional adenopathy.  Left breast normal to inspection and palpation.  No regional adenopathy. Musculoskeletal:        General: No swelling.     Cervical back: Normal range of motion and neck supple. No rigidity or tenderness.  Skin:    General: Skin is warm and dry.  Neurological:     General: No focal deficit present.     Mental Status: She is alert.       LAB RESULTS:  CMP     Component Value Date/Time   NA 141 10/03/2022 1314   K 4.0 10/03/2022 1314   CL 105 10/03/2022 1314   CO2 26 10/03/2022 1314   GLUCOSE 149 (H) 10/03/2022 1314   BUN 14 10/03/2022 1314   CREATININE 0.70 10/03/2022 1314   CREATININE 0.71 07/15/2022 1518   CALCIUM 9.1 10/03/2022 1314   PROT 7.1 10/03/2022 1314   ALBUMIN 4.1 10/03/2022 1314   AST 41 10/03/2022 1314   ALT 30 10/03/2022 1314   ALKPHOS 60 10/03/2022 1314    BILITOT 0.5 10/03/2022 1314   GFRNONAA >60 10/03/2022 1314   GFRAA >60 01/29/2020 1526  No results found for: "TOTALPROTELP", "ALBUMINELP", "A1GS", "A2GS", "BETS", "BETA2SER", "GAMS", "MSPIKE", "SPEI"  Lab Results  Component Value Date   WBC 11.0 (H) 10/03/2022   NEUTROABS 6.7 10/03/2022   HGB 13.6 10/03/2022   HCT 38.6 10/03/2022   MCV 88.5 10/03/2022   PLT 235 10/03/2022    No results found for: "LABCA2"  No components found for: "VOZDGU440"  No results for input(s): "INR" in the last 168 hours.  No results found for: "LABCA2"  No results found for: "HKV425"  No results found for: "CAN125"  No results found for: "CAN153"  No results found for: "CA2729"  No components found for: "HGQUANT"  No results found for: "CEA1", "CEA" / No results found for: "CEA1", "CEA"   No results found for: "AFPTUMOR"  No results found for: "CHROMOGRNA"  No results found for: "KPAFRELGTCHN", "LAMBDASER", "KAPLAMBRATIO" (kappa/lambda light chains)  No results found for: "HGBA", "HGBA2QUANT", "HGBFQUANT", "HGBSQUAN" (Hemoglobinopathy evaluation)   No results found for: "LDH"  No results found for: "IRON", "TIBC", "IRONPCTSAT" (Iron and TIBC)  No results found for: "FERRITIN"  Urinalysis    Component Value Date/Time   BILIRUBINUR negative 02/25/2015 1645   PROTEINUR + 0.15 02/25/2015 1645   UROBILINOGEN 0.2 02/25/2015 1645   NITRITE negative 02/25/2015 1645   LEUKOCYTESUR small (1+) (A) 02/25/2015 1645    STUDIES: No results found.   ELIGIBLE FOR AVAILABLE RESEARCH PROTOCOL: AET  ASSESSMENT:   52 y.o. [place] woman status post right breast lower outer quadrant biopsy 01/17/2020  for a clinical T1c N1, stage IB invasive mammary carcinoma, grade 2, estrogen and progesterone receptor positive, HER-2 not amplified  (a) biopsy of the left axillary "lymph node" was positive but included no lymph node tissue  (b) biopsy of a suspicious periareoral area was positive  for ductal carcinoma in situ, grade 1  (c) biopsy of a left breast LOQ mass noted on 02/06/2020   (d) CT chest and bone scan 02/11/2020 show no evidence of metastatic disease  (1) genetics testing 02/15/2020 through the [facility] Common Hereditary Cancers Panel.  Found no deleterious mutations in APC, ATM, AXIN2, BARD1, BMPR1A, BRCA1, BRCA2, BRIP1, CDH1, CDK4, CDKN2A (p14ARF), CDKN2A (p16INK4a), CHEK2, CTNNA1, DICER1, EPCAM (Deletion/duplication testing only), GREM1 (promoter region deletion/duplication testing only), KIT, MEN1, MLH1, MSH2, MSH3, MSH6, MUTYH, NBN, NF1, NHTL1, PALB2, PDGFRA, PMS2, POLD1, POLE, PTEN, RAD50, RAD51C, RAD51D, RNF43, SDHB, SDHC, SDHD, SMAD4, SMARCA4. STK11, TP53, TSC1, TSC2, and VHL.  The following genes were evaluated for sequence changes only: SDHA and HOXB13 c.251G>A variant only.   (2) MammaPrint obtained from the 01/17/2020 biopsy read as "low risk" predicting a 93% chance of distant disease free survival at 5 years without the need for chemotherapy  (3) tamoxifen started 02/13/2020 in anticipation of surgical delays  (a) started ovarian suppression 04/30/2020 with monthly goserelin/Zoladex  (4) status post right modified radical mastectomy 03/30/2020 for a pT3 pN1, stage IIA invasive ductal carcinoma, grade 2, with evidence of lymphovascular space involvement but negative margins  (a) a total of 9 right axillary lymph nodes removed, 2 positive with focal extracapsular extension  (b) status post right breast reconstruction 05/28/2020 with a [employer] Smooth Round Ultra High Profile Gel 650 cc. Ref #[id number].  Serial Number [id number]; left mastopexy same date  (5) adjuvant radiation completed 08/25/2020   PLAN:  Ms. Leialoha is here for follow-up.   She is now on tamoxifen, we held zoladex to determine menopausal status. Labs in process. NO other concerns on exam.  Most recent mammogram with  no evidence of malignancy. In her case, we discussed about extended anti  estrogen therapy for 10 yrs. After 5 yrs on tamoxifen, we can consider switching to aromatase inhibitors. She is agreeable to this plan.  Total encounter time 20 minutes.**Total Encounter Time as defined by the Centers for Medicare and Medicaid Services includes, in addition to the face-to-face time of a patient visit (documented in the note above) non-face-to-face time: obtaining and reviewing outside history, ordering and reviewing medications, tests or procedures, care coordination (communications with other health care professionals or caregivers) and documentation in the medical record.

## 2022-12-03 LAB — FOLLICLE STIMULATING HORMONE: FSH: 1.7 m[IU]/mL

## 2022-12-03 LAB — ESTRADIOL: Estradiol: 14.6 pg/mL

## 2022-12-03 LAB — LUTEINIZING HORMONE: LH: 0.6 m[IU]/mL

## 2022-12-08 ENCOUNTER — Encounter: Payer: Self-pay | Admitting: *Deleted

## 2022-12-13 ENCOUNTER — Encounter: Payer: Self-pay | Admitting: *Deleted

## 2022-12-15 ENCOUNTER — Encounter: Payer: Self-pay | Admitting: Hematology and Oncology

## 2022-12-21 ENCOUNTER — Telehealth: Payer: Self-pay | Admitting: Hematology and Oncology

## 2022-12-30 ENCOUNTER — Inpatient Hospital Stay: Payer: 59 | Attending: Oncology

## 2022-12-30 VITALS — BP 128/76 | HR 79 | Temp 99.0°F | Resp 18

## 2022-12-30 DIAGNOSIS — Z806 Family history of leukemia: Secondary | ICD-10-CM | POA: Insufficient documentation

## 2022-12-30 DIAGNOSIS — Z9011 Acquired absence of right breast and nipple: Secondary | ICD-10-CM | POA: Diagnosis not present

## 2022-12-30 DIAGNOSIS — Z923 Personal history of irradiation: Secondary | ICD-10-CM | POA: Diagnosis not present

## 2022-12-30 DIAGNOSIS — Z170 Estrogen receptor positive status [ER+]: Secondary | ICD-10-CM | POA: Diagnosis not present

## 2022-12-30 DIAGNOSIS — Z803 Family history of malignant neoplasm of breast: Secondary | ICD-10-CM | POA: Insufficient documentation

## 2022-12-30 DIAGNOSIS — Z79810 Long term (current) use of selective estrogen receptor modulators (SERMs): Secondary | ICD-10-CM | POA: Insufficient documentation

## 2022-12-30 DIAGNOSIS — Z801 Family history of malignant neoplasm of trachea, bronchus and lung: Secondary | ICD-10-CM | POA: Insufficient documentation

## 2022-12-30 DIAGNOSIS — Z800 Family history of malignant neoplasm of digestive organs: Secondary | ICD-10-CM | POA: Diagnosis not present

## 2022-12-30 DIAGNOSIS — C50511 Malignant neoplasm of lower-outer quadrant of right female breast: Secondary | ICD-10-CM | POA: Diagnosis not present

## 2022-12-30 MED ORDER — GOSERELIN ACETATE 3.6 MG ~~LOC~~ IMPL
3.6000 mg | DRUG_IMPLANT | Freq: Once | SUBCUTANEOUS | Status: AC
  Administered 2022-12-30: 3.6 mg via SUBCUTANEOUS
  Filled 2022-12-30: qty 3.6

## 2023-01-17 ENCOUNTER — Ambulatory Visit: Payer: 59 | Admitting: Plastic Surgery

## 2023-02-07 ENCOUNTER — Ambulatory Visit: Payer: 59 | Admitting: Plastic Surgery

## 2023-02-14 ENCOUNTER — Other Ambulatory Visit: Payer: Self-pay | Admitting: Family Medicine

## 2023-02-15 ENCOUNTER — Encounter: Payer: Self-pay | Admitting: Hematology and Oncology

## 2023-03-03 ENCOUNTER — Encounter: Payer: Self-pay | Admitting: Family Medicine

## 2023-03-03 ENCOUNTER — Ambulatory Visit: Payer: 59 | Admitting: Family Medicine

## 2023-03-03 VITALS — BP 118/74 | HR 69 | Temp 98.5°F | Ht 63.0 in | Wt 172.0 lb

## 2023-03-03 DIAGNOSIS — R809 Proteinuria, unspecified: Secondary | ICD-10-CM | POA: Diagnosis not present

## 2023-03-03 DIAGNOSIS — E039 Hypothyroidism, unspecified: Secondary | ICD-10-CM | POA: Diagnosis not present

## 2023-03-03 DIAGNOSIS — E1129 Type 2 diabetes mellitus with other diabetic kidney complication: Secondary | ICD-10-CM

## 2023-03-03 LAB — POCT GLYCOSYLATED HEMOGLOBIN (HGB A1C): Hemoglobin A1C: 6.5 % — AB (ref 4.0–5.6)

## 2023-03-03 MED ORDER — LEVOTHYROXINE SODIUM 50 MCG PO TABS
50.0000 ug | ORAL_TABLET | Freq: Every day | ORAL | 12 refills | Status: DC
Start: 2023-03-03 — End: 2023-09-10

## 2023-03-03 MED ORDER — ATORVASTATIN CALCIUM 10 MG PO TABS: 10.0000 mg | ORAL_TABLET | Freq: Every day | ORAL | 3 refills | Status: DC

## 2023-03-03 MED ORDER — ESCITALOPRAM OXALATE 10 MG PO TABS: 10.0000 mg | ORAL_TABLET | Freq: Every day | ORAL | 3 refills | Status: DC

## 2023-03-03 NOTE — Patient Instructions (Addendum)
Go to the lab on the way out.   If you have mychart we'll likely use that to update you.    Take care.  Glad to see you.  If you have any low sugars, then cut back on metformin to 1 or two tabs a day.  Update [place] as needed.    Plan on recheck at a yearly visit in about 6 months with labs ahead of time.

## 2023-03-03 NOTE — Progress Notes (Unsigned)
Diabetes:  Using medications without difficulties:yes Hypoglycemic episodes: no sx Hyperglycemic episodes: no sx Feet problems: no Blood Sugars averaging: not checked.   eye exam within last year: yes She had missed some doses of metformin on vacation but restarted in the meantime.   She is working on diet and exercise.   No ADE on med.    Flu shot encouraged.   Meds, vitals, and allergies reviewed.  ROS: Per HPI unless specifically indicated in ROS section   GEN: nad, alert and oriented HEENT: ncat NECK: supple w/o LA CV: rrr. PULM: ctab, no inc wob ABD: soft, +bs EXT: no edema SKIN: well perfused.

## 2023-03-04 LAB — MICROALBUMIN / CREATININE URINE RATIO
Creatinine, Urine: 174 mg/dL (ref 20–275)
Microalb Creat Ratio: 19 mg/g{creat} (ref <30)
Microalb, Ur: 3.3 mg/dL

## 2023-03-05 NOTE — Assessment & Plan Note (Signed)
See notes on labs. A1c discussed with patient. If any low sugars, then cut back on metformin to 1 or two tabs a day.  Update [place] at clinic as needed. Plan on recheck at a yearly visit in about 6 months with labs ahead of time.

## 2023-03-23 DIAGNOSIS — D1801 Hemangioma of skin and subcutaneous tissue: Secondary | ICD-10-CM | POA: Diagnosis not present

## 2023-03-23 DIAGNOSIS — L814 Other melanin hyperpigmentation: Secondary | ICD-10-CM | POA: Diagnosis not present

## 2023-03-23 DIAGNOSIS — L821 Other seborrheic keratosis: Secondary | ICD-10-CM | POA: Diagnosis not present

## 2023-03-23 DIAGNOSIS — D2262 Melanocytic nevi of left upper limb, including shoulder: Secondary | ICD-10-CM | POA: Diagnosis not present

## 2023-03-23 DIAGNOSIS — L400 Psoriasis vulgaris: Secondary | ICD-10-CM | POA: Diagnosis not present

## 2023-03-23 DIAGNOSIS — D225 Melanocytic nevi of trunk: Secondary | ICD-10-CM | POA: Diagnosis not present

## 2023-03-23 DIAGNOSIS — L720 Epidermal cyst: Secondary | ICD-10-CM | POA: Diagnosis not present

## 2023-05-18 ENCOUNTER — Other Ambulatory Visit: Payer: Self-pay | Admitting: Family Medicine

## 2023-05-18 ENCOUNTER — Other Ambulatory Visit: Payer: Self-pay | Admitting: Hematology and Oncology

## 2023-05-18 DIAGNOSIS — C50919 Malignant neoplasm of unspecified site of unspecified female breast: Secondary | ICD-10-CM

## 2023-05-18 DIAGNOSIS — C50511 Malignant neoplasm of lower-outer quadrant of right female breast: Secondary | ICD-10-CM

## 2023-06-09 ENCOUNTER — Ambulatory Visit: Payer: 59 | Admitting: Hematology and Oncology

## 2023-06-09 ENCOUNTER — Inpatient Hospital Stay: Payer: 59 | Attending: Hematology and Oncology | Admitting: Hematology and Oncology

## 2023-06-09 ENCOUNTER — Encounter: Payer: Self-pay | Admitting: Hematology and Oncology

## 2023-06-09 VITALS — BP 128/67 | HR 78 | Temp 97.7°F | Resp 18 | Ht 63.0 in | Wt 172.7 lb

## 2023-06-09 DIAGNOSIS — E119 Type 2 diabetes mellitus without complications: Secondary | ICD-10-CM | POA: Diagnosis not present

## 2023-06-09 DIAGNOSIS — Z803 Family history of malignant neoplasm of breast: Secondary | ICD-10-CM | POA: Insufficient documentation

## 2023-06-09 DIAGNOSIS — Z7985 Long-term (current) use of injectable non-insulin antidiabetic drugs: Secondary | ICD-10-CM | POA: Diagnosis not present

## 2023-06-09 DIAGNOSIS — Z79810 Long term (current) use of selective estrogen receptor modulators (SERMs): Secondary | ICD-10-CM | POA: Insufficient documentation

## 2023-06-09 DIAGNOSIS — Z800 Family history of malignant neoplasm of digestive organs: Secondary | ICD-10-CM | POA: Insufficient documentation

## 2023-06-09 DIAGNOSIS — Z170 Estrogen receptor positive status [ER+]: Secondary | ICD-10-CM | POA: Insufficient documentation

## 2023-06-09 DIAGNOSIS — Z9011 Acquired absence of right breast and nipple: Secondary | ICD-10-CM | POA: Diagnosis not present

## 2023-06-09 DIAGNOSIS — Z806 Family history of leukemia: Secondary | ICD-10-CM | POA: Insufficient documentation

## 2023-06-09 DIAGNOSIS — Z923 Personal history of irradiation: Secondary | ICD-10-CM | POA: Diagnosis not present

## 2023-06-09 DIAGNOSIS — Z801 Family history of malignant neoplasm of trachea, bronchus and lung: Secondary | ICD-10-CM | POA: Diagnosis not present

## 2023-06-09 DIAGNOSIS — C50511 Malignant neoplasm of lower-outer quadrant of right female breast: Secondary | ICD-10-CM | POA: Insufficient documentation

## 2023-06-09 NOTE — Progress Notes (Signed)
[facility]  [phone] [phone]     ID: AYA GEISEL DOB: 10-Aug-1970  MR#: [id number]  ACZ#:[id number]  Patient Care Team: Joaquim Nam, MD as PCP - General (Family Medicine) Pershing Proud, RN as Oncology Nurse Navigator Donnelly Angelica, RN as Oncology Nurse Navigator Griselda Miner, MD as Consulting Physician (General Surgery) Dillingham, Alena Bills, DO as Attending Physician (Plastic Surgery) Rachel Moulds, MD OTHER MD:  CHIEF COMPLAINT: Estrogen receptor positive breast cancer (s/p right mastectomy)  CURRENT TREATMENT: tamoxifen, goserelin  INTERVAL HISTORY:  Discussed the use of AI scribe software for clinical note transcription with the patient, who gave verbal consent to proceed.  History of Present Illness    Starlit "Karoline Caldwell" returns today for follow up of her estrogen receptor positive breast cancer.  She is on tamoxifen only. She was curious to see if she was post menopausal, hence we held zoladex, did labs which confirmed pre menopausal status. She also was irregular with zoladex shots and recently had a cycle.  The patient admits to forgetting to reschedule the shots due to 'a lot going on.'  The patient's diabetes is managed with metformin and Trulicity, with a recent hemoglobin A1c of 'six something.' She reports exercising when possible, including walking and using stairs at home.She is compliant with tamoxifen otherwise.  Rest of the pertinent 10 point ROS reviewed and negative  REVIEW OF SYSTEMS:   COVID 19 VACCINATION STATUS: Not vaccinated as of December 2022   HISTORY OF CURRENT ILLNESS: From the original visit note:  IEESHA ABBASI herself palpated a right breast mass. She underwent bilateral diagnostic mammography with tomography and right breast ultrasonography at [facility] on 01/15/2020 showing: breast density category B; palpable 1.6 cm mass in right breast at 5:30, with adjacent 5 mm probable  satellite mass; 0.9 cm mass in retroareolar right breast, also at 5:30; two enlarged lymph nodes in right breast at 9:30.  Accordingly on 01/17/2020 she proceeded to biopsy of the right breast areas in question. The pathology from this procedure ([id number]) showed:  1. Right Breast, 5:30  - invasive mammary carcinoma, grade 2  - ductal carcinoma in situ, low grade.   - Prognostic indicators significant for: estrogen receptor, 95% positive and progesterone receptor, 95% positive, both with strong staining intensity. Proliferation marker Ki67 not obtained. HER2 negative by immunohistochemistry (0). 2. Right Breast, retroareolar  - ductal carcinoma in situ 3. Lymph Node, right axilla  - invasive mammary carcinoma  - lymph node tissue not identified  We do not have an e-cadherin or Ki67 on this tissue.  The patient's subsequent history is as detailed below.   PAST MEDICAL HISTORY: Past Medical History:  Diagnosis Date   Anxiety    h/o, was prev on zoloft   Breast cancer (HCC) 01/2020   seeing Dr. Carolynne Edouard   Cholelithiasis    Diabetes mellitus without complication [facility])    Family history of breast cancer    Family history of leukemia    Family history of lung cancer    Family history of prostate cancer    Family history of stomach cancer    Hypothyroidism    postpartum hypothyroidism   IUD (intrauterine device) in place    mirena per [facility]   Major depression     PAST SURGICAL HISTORY: Past Surgical History:  Procedure Laterality Date   BREAST RECONSTRUCTION WITH PLACEMENT OF TISSUE EXPANDER AND FLEX HD (ACELLULAR HYDRATED DERMIS) Right 03/30/2020   Procedure:  BREAST RECONSTRUCTION WITH PLACEMENT OF TISSUE EXPANDER AND FLEX HD (ACELLULAR HYDRATED DERMIS);  Surgeon: Peggye Form, DO;  Location: [facility];  Service: Plastics;  Laterality: Right;   BREAST REDUCTION WITH MASTOPEXY Left 05/28/2020   Procedure: BREAST REDUCTION WITH MASTOPEXY;  Surgeon:  Peggye Form, DO;  Location: [facility];  Service: Plastics;  Laterality: Left;   CESAREAN SECTION     X 2 Fetal stress, second scheduled   CHOLECYSTECTOMY  05/1998   LIPOSUCTION WITH LIPOFILLING Right 07/21/2021   Procedure: LIPOSUCTION WITH LIPOFILLING OF RIGHT BREAST;  Surgeon: Peggye Form, DO;  Location: [facility];  Service: Plastics;  Laterality: Right;   MASTECTOMY MODIFIED RADICAL Right 03/30/2020   Procedure: RIGHT MASTECTOMY MODIFIED RADICAL;  Surgeon: Griselda Miner, MD;  Location: [facility];  Service: General;  Laterality: Right;  PEC BLOCK, RNFA   REMOVAL OF TISSUE EXPANDER AND PLACEMENT OF IMPLANT Right 05/28/2020   Procedure: REMOVAL OF TISSUE EXPANDER AND PLACEMENT OF IMPLANT;  Surgeon: Peggye Form, DO;  Location: [facility];  Service: Plastics;  Laterality: Right;  2.5 hours, please    FAMILY HISTORY: Family History  Problem Relation Age of Onset   Hypertension Father    Depression Father    Prostate cancer Father 93       Prostate, S/P prostatectomy, metastatic   Asthma Sister    Spina bifida Sister    Hypothyroidism Mother    Hypertension Maternal Grandmother    Hyperlipidemia Maternal Grandmother    Lung disease Maternal Grandmother    Lung cancer Maternal Grandmother 57   Hypothyroidism Maternal Grandfather    Prostate cancer Maternal Grandfather    Cancer Maternal Grandfather        lung, stomach, and prostate cancers   Emphysema Paternal Grandmother    Diabetes Maternal Uncle    Diabetes Maternal Uncle    Leukemia Maternal Uncle 75   Prostate cancer Maternal Uncle        dx in his 60s/70s, metastatic   Breast cancer Other 45       negative genetic testing; maternal first cousin   Stroke Neg Hx    Colon cancer Neg Hx    The patient's father died at age 92 from prostate cancer.  The patient's mother is 9 years old as of September 2021.  The patient has 1 uncle with  pancreatic cancer and one cousin on the same side of the family with breast cancer.   GYNECOLOGIC HISTORY:  No LMP recorded. Patient is postmenopausal. Menarche: 53 years old Age at first live birth: 53 years old GX P 2 Contraceptive Mirena in place Hysterectomy?  No BSO?  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  She is divorced.  She lives alone although her significant other Aris Everts (who is disabled secondary to COPD) sometimes comes over.  Her sons are Gay Filler (24), who is studying dentistry at [employer], and Mantee (21), who is also at [employer] considering dentistry or physical therapy.   ADVANCED DIRECTIVES: Not in place.  The patient intends to name her older son as her healthcare power of attorney and the appropriate documents were giving her on 01/29/2020 for her to complete and notarize at her discretion   HEALTH MAINTENANCE: Social History   Tobacco Use   Smoking status: Never   Smokeless tobacco: Never  Vaping Use  Vaping status: Never Used  Substance Use Topics   Alcohol use: Yes    Alcohol/week: 0.0 standard drinks of alcohol    Comment: Very rarely.   Drug use: No     Colonoscopy:   PAP: Not up-to-date  Bone density: Never   Allergies  Allergen Reactions   Codeine Nausea And Vomiting   Zoloft [Sertraline Hcl]     headaches    Current Outpatient Medications  Medication Sig Dispense Refill   acyclovir ointment (ZOVIRAX) 5 % Apply 1 application topically every 3 (three) hours. As needed. 5 g 5   atorvastatin (LIPITOR) 10 MG tablet Take 1 tablet (10 mg total) by mouth daily. 90 tablet 3   cholecalciferol (VITAMIN D3) 25 MCG (1000 UNIT) tablet Take 1,000 Units by mouth daily.     cyclobenzaprine (FLEXERIL) 10 MG tablet TAKE 1/2 TO 1 TABLET BY MOUTH EVERY DAY AS NEEDED 30 tablet 5   Dulaglutide (TRULICITY) 0.75 MG/0.5ML SOPN INJECT 0.75 MG  SUBCUTANEOUSLY ONE TIME PER WEEK 6 mL 2   escitalopram (LEXAPRO) 10 MG tablet Take 1 tablet (10 mg total) by mouth daily. 90 tablet 3   ibuprofen (ADVIL,MOTRIN) 200 MG tablet Take 200 mg by mouth every 6 (six) hours as needed.     levothyroxine (SYNTHROID) 50 MCG tablet Take 1 tablet (50 mcg total) by mouth daily before breakfast. 30 tablet 12   metFORMIN (GLUCOPHAGE) 500 MG tablet TAKE 1 TABLET (500 MG) DAILY AND THEN INCREASE TO TWICE A DAY AFTER 2 WEEKS IF TOLERATED) 60 tablet 5   tamoxifen (NOLVADEX) 20 MG tablet TAKE 1 TABLET BY MOUTH EVERY DAY 30 tablet 5   Turmeric (QC TUMERIC COMPLEX PO) Take by mouth.     valACYclovir (VALTREX) 1000 MG tablet TAKE 2 TABLETS (2,000 MG TOTAL) BY MOUTH 2 (TWO) TIMES DAILY AS NEEDED. FOR 1 DAY. 20 tablet 5   No current facility-administered medications for this visit.    OBJECTIVE: White woman who appears well  Vitals:   06/09/23 1148  BP: 128/67  Pulse: 78  Resp: 18  Temp: 97.7 F (36.5 C)  SpO2: 98%     Body mass index is 30.59 kg/m.   Wt Readings from Last 3 Encounters:  06/09/23 172 lb 11.2 oz (78.3 kg)  03/03/23 172 lb (78 kg)  12/02/22 173 lb (78.5 kg)     ECOG FS:1 - Symptomatic but completely ambulatory  Physical Exam Constitutional:      Appearance: Normal appearance.  Chest:     Comments: Right sided mastectomy with reconstruction.  No palpable masses or regional adenopathy.  Left breast normal to inspection and palpation.  No regional adenopathy. Musculoskeletal:        General: No swelling.     Cervical back: Normal range of motion and neck supple. No rigidity or tenderness.  Skin:    General: Skin is warm and dry.  Neurological:     General: No focal deficit present.     Mental Status: She is alert.       LAB RESULTS:  CMP     Component Value Date/Time   NA 141 10/03/2022 1314   K 4.0 10/03/2022 1314   CL 105 10/03/2022 1314   CO2 26 10/03/2022 1314   GLUCOSE 149 (H) 10/03/2022 1314   BUN 14 10/03/2022 1314    CREATININE 0.70 10/03/2022 1314   CREATININE 0.71 07/15/2022 1518   CALCIUM 9.1 10/03/2022 1314   PROT 7.1 10/03/2022 1314   ALBUMIN 4.1 10/03/2022 1314  AST 41 10/03/2022 1314   ALT 30 10/03/2022 1314   ALKPHOS 60 10/03/2022 1314   BILITOT 0.5 10/03/2022 1314   GFRNONAA >60 10/03/2022 1314   GFRAA >60 01/29/2020 1526    No results found for: "TOTALPROTELP", "ALBUMINELP", "A1GS", "A2GS", "BETS", "BETA2SER", "GAMS", "MSPIKE", "SPEI"  Lab Results  Component Value Date   WBC 11.0 (H) 10/03/2022   NEUTROABS 6.7 10/03/2022   HGB 13.6 10/03/2022   HCT 38.6 10/03/2022   MCV 88.5 10/03/2022   PLT 235 10/03/2022    No results found for: "LABCA2"  No components found for: "ZOXWRU045"  No results for input(s): "INR" in the last 168 hours.  No results found for: "LABCA2"  No results found for: "WUJ811"  No results found for: "CAN125"  No results found for: "CAN153"  No results found for: "CA2729"  No components found for: "HGQUANT"  No results found for: "CEA1", "CEA" / No results found for: "CEA1", "CEA"   No results found for: "AFPTUMOR"  No results found for: "CHROMOGRNA"  No results found for: "KPAFRELGTCHN", "LAMBDASER", "KAPLAMBRATIO" (kappa/lambda light chains)  No results found for: "HGBA", "HGBA2QUANT", "HGBFQUANT", "HGBSQUAN" (Hemoglobinopathy evaluation)   No results found for: "LDH"  No results found for: "IRON", "TIBC", "IRONPCTSAT" (Iron and TIBC)  No results found for: "FERRITIN"  Urinalysis    Component Value Date/Time   BILIRUBINUR negative 02/25/2015 1645   PROTEINUR + 0.15 02/25/2015 1645   UROBILINOGEN 0.2 02/25/2015 1645   NITRITE negative 02/25/2015 1645   LEUKOCYTESUR small (1+) (A) 02/25/2015 1645    STUDIES: No results found.   ELIGIBLE FOR AVAILABLE RESEARCH PROTOCOL: AET  ASSESSMENT:   53 y.o. [place] woman status post right breast lower outer quadrant biopsy 01/17/2020  for a clinical T1c N1, stage IB invasive  mammary carcinoma, grade 2, estrogen and progesterone receptor positive, HER-2 not amplified  (a) biopsy of the left axillary "lymph node" was positive but included no lymph node tissue  (b) biopsy of a suspicious periareoral area was positive for ductal carcinoma in situ, grade 1  (c) biopsy of a left breast LOQ mass noted on 02/06/2020   (d) CT chest and bone scan 02/11/2020 show no evidence of metastatic disease  (1) genetics testing 02/15/2020 through the [facility] Common Hereditary Cancers Panel.  Found no deleterious mutations in APC, ATM, AXIN2, BARD1, BMPR1A, BRCA1, BRCA2, BRIP1, CDH1, CDK4, CDKN2A (p14ARF), CDKN2A (p16INK4a), CHEK2, CTNNA1, DICER1, EPCAM (Deletion/duplication testing only), GREM1 (promoter region deletion/duplication testing only), KIT, MEN1, MLH1, MSH2, MSH3, MSH6, MUTYH, NBN, NF1, NHTL1, PALB2, PDGFRA, PMS2, POLD1, POLE, PTEN, RAD50, RAD51C, RAD51D, RNF43, SDHB, SDHC, SDHD, SMAD4, SMARCA4. STK11, TP53, TSC1, TSC2, and VHL.  The following genes were evaluated for sequence changes only: SDHA and HOXB13 c.251G>A variant only.   (2) MammaPrint obtained from the 01/17/2020 biopsy read as "low risk" predicting a 93% chance of distant disease free survival at 5 years without the need for chemotherapy  (3) tamoxifen started 02/13/2020 in anticipation of surgical delays  (a) started ovarian suppression 04/30/2020 with monthly goserelin/Zoladex  (4) status post right modified radical mastectomy 03/30/2020 for a pT3 pN1, stage IIA invasive ductal carcinoma, grade 2, with evidence of lymphovascular space involvement but negative margins  (a) a total of 9 right axillary lymph nodes removed, 2 positive with focal extracapsular extension  (b) status post right breast reconstruction 05/28/2020 with a Mentor Smooth Round Ultra High Profile Gel 650 cc. Ref #[id number].  Serial Number [id number]; left mastopexy same date  (5) adjuvant radiation completed 08/25/2020  PLAN:  Breast  Cancer Premenopausal status confirmed by recent menses. Patient has been on Tamoxifen but missed Zoladex shots. Tamoxifen is more effective when combined with Zoladex. -Resume Zoladex shots. Attempt to schedule one today, if not, schedule for the near future. -Continue Tamoxifen.  Type 2 Diabetes Patient on Metformin and Trulicity. Recent Hemoglobin A1c was in the 6 range indicating good control. -Continue current regimen of Metformin and Trulicity.  Breast Asymmetry Post-Implant Patient reports dissatisfaction with current symmetry post-implant and fat grafting. -Consider further surgical intervention for symmetry if patient desires.  General Health Maintenance -Continue exercise regimen as tolerated. -Return for follow-up in 6 months or sooner if any changes occur.  Total encounter time 20 minutes.**Total Encounter Time as defined by the Centers for Medicare and Medicaid Services includes, in addition to the face-to-face time of a patient visit (documented in the note above) non-face-to-face time: obtaining and reviewing outside history, ordering and reviewing medications, tests or procedures, care coordination (communications with other health care professionals or caregivers) and documentation in the medical record.

## 2023-06-16 ENCOUNTER — Inpatient Hospital Stay: Payer: 59

## 2023-06-16 ENCOUNTER — Encounter: Payer: Self-pay | Admitting: Hematology and Oncology

## 2023-06-16 VITALS — BP 110/66 | HR 83 | Temp 97.8°F | Resp 16

## 2023-06-16 DIAGNOSIS — Z7985 Long-term (current) use of injectable non-insulin antidiabetic drugs: Secondary | ICD-10-CM | POA: Diagnosis not present

## 2023-06-16 DIAGNOSIS — Z923 Personal history of irradiation: Secondary | ICD-10-CM | POA: Diagnosis not present

## 2023-06-16 DIAGNOSIS — C50511 Malignant neoplasm of lower-outer quadrant of right female breast: Secondary | ICD-10-CM | POA: Diagnosis not present

## 2023-06-16 DIAGNOSIS — Z9011 Acquired absence of right breast and nipple: Secondary | ICD-10-CM | POA: Diagnosis not present

## 2023-06-16 DIAGNOSIS — Z803 Family history of malignant neoplasm of breast: Secondary | ICD-10-CM | POA: Diagnosis not present

## 2023-06-16 DIAGNOSIS — Z800 Family history of malignant neoplasm of digestive organs: Secondary | ICD-10-CM | POA: Diagnosis not present

## 2023-06-16 DIAGNOSIS — Z79810 Long term (current) use of selective estrogen receptor modulators (SERMs): Secondary | ICD-10-CM | POA: Diagnosis not present

## 2023-06-16 DIAGNOSIS — Z806 Family history of leukemia: Secondary | ICD-10-CM | POA: Diagnosis not present

## 2023-06-16 DIAGNOSIS — Z801 Family history of malignant neoplasm of trachea, bronchus and lung: Secondary | ICD-10-CM | POA: Diagnosis not present

## 2023-06-16 DIAGNOSIS — Z170 Estrogen receptor positive status [ER+]: Secondary | ICD-10-CM | POA: Diagnosis not present

## 2023-06-16 DIAGNOSIS — E119 Type 2 diabetes mellitus without complications: Secondary | ICD-10-CM | POA: Diagnosis not present

## 2023-06-16 MED ORDER — GOSERELIN ACETATE 3.6 MG ~~LOC~~ IMPL
3.6000 mg | DRUG_IMPLANT | Freq: Once | SUBCUTANEOUS | Status: AC
  Administered 2023-06-16: 3.6 mg via SUBCUTANEOUS
  Filled 2023-06-16: qty 3.6

## 2023-07-14 ENCOUNTER — Inpatient Hospital Stay: Payer: 59 | Attending: Hematology and Oncology

## 2023-07-14 VITALS — BP 147/68 | HR 70 | Temp 99.1°F | Resp 16

## 2023-07-14 DIAGNOSIS — Z800 Family history of malignant neoplasm of digestive organs: Secondary | ICD-10-CM | POA: Diagnosis not present

## 2023-07-14 DIAGNOSIS — Z79810 Long term (current) use of selective estrogen receptor modulators (SERMs): Secondary | ICD-10-CM | POA: Insufficient documentation

## 2023-07-14 DIAGNOSIS — Z170 Estrogen receptor positive status [ER+]: Secondary | ICD-10-CM | POA: Insufficient documentation

## 2023-07-14 DIAGNOSIS — Z7985 Long-term (current) use of injectable non-insulin antidiabetic drugs: Secondary | ICD-10-CM | POA: Insufficient documentation

## 2023-07-14 DIAGNOSIS — Z801 Family history of malignant neoplasm of trachea, bronchus and lung: Secondary | ICD-10-CM | POA: Insufficient documentation

## 2023-07-14 DIAGNOSIS — Z9011 Acquired absence of right breast and nipple: Secondary | ICD-10-CM | POA: Insufficient documentation

## 2023-07-14 DIAGNOSIS — Z923 Personal history of irradiation: Secondary | ICD-10-CM | POA: Diagnosis not present

## 2023-07-14 DIAGNOSIS — Z803 Family history of malignant neoplasm of breast: Secondary | ICD-10-CM | POA: Insufficient documentation

## 2023-07-14 DIAGNOSIS — C50511 Malignant neoplasm of lower-outer quadrant of right female breast: Secondary | ICD-10-CM | POA: Diagnosis not present

## 2023-07-14 DIAGNOSIS — E119 Type 2 diabetes mellitus without complications: Secondary | ICD-10-CM | POA: Diagnosis not present

## 2023-07-14 DIAGNOSIS — Z806 Family history of leukemia: Secondary | ICD-10-CM | POA: Diagnosis not present

## 2023-07-14 MED ORDER — GOSERELIN ACETATE 3.6 MG ~~LOC~~ IMPL
3.6000 mg | DRUG_IMPLANT | Freq: Once | SUBCUTANEOUS | Status: AC
Start: 2023-07-14 — End: 2023-07-14
  Administered 2023-07-14: 3.6 mg via SUBCUTANEOUS
  Filled 2023-07-14: qty 3.6

## 2023-07-18 DIAGNOSIS — L400 Psoriasis vulgaris: Secondary | ICD-10-CM | POA: Diagnosis not present

## 2023-08-11 ENCOUNTER — Inpatient Hospital Stay: Payer: 59 | Attending: Hematology and Oncology

## 2023-08-11 VITALS — BP 129/66 | HR 81 | Temp 98.9°F | Resp 16

## 2023-08-11 DIAGNOSIS — Z79810 Long term (current) use of selective estrogen receptor modulators (SERMs): Secondary | ICD-10-CM | POA: Insufficient documentation

## 2023-08-11 DIAGNOSIS — Z7985 Long-term (current) use of injectable non-insulin antidiabetic drugs: Secondary | ICD-10-CM | POA: Insufficient documentation

## 2023-08-11 DIAGNOSIS — C50511 Malignant neoplasm of lower-outer quadrant of right female breast: Secondary | ICD-10-CM | POA: Diagnosis not present

## 2023-08-11 DIAGNOSIS — Z800 Family history of malignant neoplasm of digestive organs: Secondary | ICD-10-CM | POA: Insufficient documentation

## 2023-08-11 DIAGNOSIS — Z801 Family history of malignant neoplasm of trachea, bronchus and lung: Secondary | ICD-10-CM | POA: Diagnosis not present

## 2023-08-11 DIAGNOSIS — E119 Type 2 diabetes mellitus without complications: Secondary | ICD-10-CM | POA: Insufficient documentation

## 2023-08-11 DIAGNOSIS — Z803 Family history of malignant neoplasm of breast: Secondary | ICD-10-CM | POA: Diagnosis not present

## 2023-08-11 DIAGNOSIS — Z9011 Acquired absence of right breast and nipple: Secondary | ICD-10-CM | POA: Insufficient documentation

## 2023-08-11 DIAGNOSIS — Z170 Estrogen receptor positive status [ER+]: Secondary | ICD-10-CM | POA: Insufficient documentation

## 2023-08-11 DIAGNOSIS — Z923 Personal history of irradiation: Secondary | ICD-10-CM | POA: Insufficient documentation

## 2023-08-11 DIAGNOSIS — Z806 Family history of leukemia: Secondary | ICD-10-CM | POA: Insufficient documentation

## 2023-08-11 MED ORDER — GOSERELIN ACETATE 3.6 MG ~~LOC~~ IMPL
3.6000 mg | DRUG_IMPLANT | Freq: Once | SUBCUTANEOUS | Status: AC
  Administered 2023-08-11: 3.6 mg via SUBCUTANEOUS
  Filled 2023-08-11: qty 3.6

## 2023-09-07 ENCOUNTER — Inpatient Hospital Stay: Attending: Hematology and Oncology

## 2023-09-07 VITALS — BP 146/81 | HR 88 | Temp 97.8°F | Resp 18

## 2023-09-07 DIAGNOSIS — Z803 Family history of malignant neoplasm of breast: Secondary | ICD-10-CM | POA: Diagnosis not present

## 2023-09-07 DIAGNOSIS — Z800 Family history of malignant neoplasm of digestive organs: Secondary | ICD-10-CM | POA: Diagnosis not present

## 2023-09-07 DIAGNOSIS — Z923 Personal history of irradiation: Secondary | ICD-10-CM | POA: Diagnosis not present

## 2023-09-07 DIAGNOSIS — Z170 Estrogen receptor positive status [ER+]: Secondary | ICD-10-CM | POA: Insufficient documentation

## 2023-09-07 DIAGNOSIS — Z9011 Acquired absence of right breast and nipple: Secondary | ICD-10-CM | POA: Insufficient documentation

## 2023-09-07 DIAGNOSIS — Z801 Family history of malignant neoplasm of trachea, bronchus and lung: Secondary | ICD-10-CM | POA: Diagnosis not present

## 2023-09-07 DIAGNOSIS — Z806 Family history of leukemia: Secondary | ICD-10-CM | POA: Diagnosis not present

## 2023-09-07 DIAGNOSIS — Z7985 Long-term (current) use of injectable non-insulin antidiabetic drugs: Secondary | ICD-10-CM | POA: Diagnosis not present

## 2023-09-07 DIAGNOSIS — E119 Type 2 diabetes mellitus without complications: Secondary | ICD-10-CM | POA: Diagnosis not present

## 2023-09-07 DIAGNOSIS — Z79810 Long term (current) use of selective estrogen receptor modulators (SERMs): Secondary | ICD-10-CM | POA: Diagnosis not present

## 2023-09-07 DIAGNOSIS — C50511 Malignant neoplasm of lower-outer quadrant of right female breast: Secondary | ICD-10-CM | POA: Insufficient documentation

## 2023-09-07 MED ORDER — GOSERELIN ACETATE 3.6 MG ~~LOC~~ IMPL
3.6000 mg | DRUG_IMPLANT | Freq: Once | SUBCUTANEOUS | Status: AC
  Administered 2023-09-07: 3.6 mg via SUBCUTANEOUS
  Filled 2023-09-07: qty 3.6

## 2023-09-08 ENCOUNTER — Inpatient Hospital Stay

## 2023-09-08 ENCOUNTER — Ambulatory Visit: Payer: 59 | Admitting: Family Medicine

## 2023-09-08 ENCOUNTER — Encounter: Payer: Self-pay | Admitting: Family Medicine

## 2023-09-08 ENCOUNTER — Inpatient Hospital Stay: Payer: 59

## 2023-09-08 VITALS — BP 124/74 | HR 82 | Temp 98.5°F | Ht 63.0 in | Wt 177.2 lb

## 2023-09-08 DIAGNOSIS — R5383 Other fatigue: Secondary | ICD-10-CM | POA: Diagnosis not present

## 2023-09-08 DIAGNOSIS — R809 Proteinuria, unspecified: Secondary | ICD-10-CM

## 2023-09-08 DIAGNOSIS — R0981 Nasal congestion: Secondary | ICD-10-CM

## 2023-09-08 DIAGNOSIS — E1129 Type 2 diabetes mellitus with other diabetic kidney complication: Secondary | ICD-10-CM | POA: Diagnosis not present

## 2023-09-08 DIAGNOSIS — Z7984 Long term (current) use of oral hypoglycemic drugs: Secondary | ICD-10-CM

## 2023-09-08 DIAGNOSIS — R0683 Snoring: Secondary | ICD-10-CM

## 2023-09-08 DIAGNOSIS — E119 Type 2 diabetes mellitus without complications: Secondary | ICD-10-CM

## 2023-09-08 LAB — CBC WITH DIFFERENTIAL/PLATELET
Absolute Lymphocytes: 3508 {cells}/uL (ref 850–3900)
Absolute Monocytes: 866 {cells}/uL (ref 200–950)
Basophils Absolute: 56 {cells}/uL (ref 0–200)
Basophils Relative: 0.5 %
Eosinophils Absolute: 111 {cells}/uL (ref 15–500)
Eosinophils Relative: 1 %
HCT: 41 % (ref 35.0–45.0)
Hemoglobin: 13.7 g/dL (ref 11.7–15.5)
MCH: 29.9 pg (ref 27.0–33.0)
MCHC: 33.4 g/dL (ref 32.0–36.0)
MCV: 89.5 fL (ref 80.0–100.0)
MPV: 9.9 fL (ref 7.5–12.5)
Monocytes Relative: 7.8 %
Neutro Abs: 6560 {cells}/uL (ref 1500–7800)
Neutrophils Relative %: 59.1 %
Platelets: 241 10*3/uL (ref 140–400)
RBC: 4.58 10*6/uL (ref 3.80–5.10)
RDW: 12.7 % (ref 11.0–15.0)
Total Lymphocyte: 31.6 %
WBC: 11.1 10*3/uL — ABNORMAL HIGH (ref 3.8–10.8)

## 2023-09-08 LAB — POCT GLYCOSYLATED HEMOGLOBIN (HGB A1C): Hemoglobin A1C: 7.4 % — AB (ref 4.0–5.6)

## 2023-09-08 LAB — TSH: TSH: 4.66 m[IU]/L — ABNORMAL HIGH

## 2023-09-08 MED ORDER — METFORMIN HCL 500 MG PO TABS: 500.0000 mg | ORAL_TABLET | Freq: Two times a day (BID) | ORAL | Status: DC

## 2023-09-08 MED ORDER — ZOLADEX 3.6 MG ~~LOC~~ IMPL: 3.6000 mg | DRUG_IMPLANT | SUBCUTANEOUS | Status: AC

## 2023-09-08 NOTE — Progress Notes (Signed)
 Diabetes:  Using medications without difficulties: yes Hypoglycemic episodes: no sx Hyperglycemic episodes: no sx Feet problems: no Blood Sugars averaging: not checked.  eye exam within last year: yes A1c d/w pt at OV.  Diet and exercise d/w pt.    D/w pt about OSA eval.  H/o snoring, fatigue noted.  Discussed pulmonary referral.    Stuffy nose.  Recently noted.  No fevers.  Not a ST but her throat feels irritated.  No vomiting.  No cough.  She can tolerate flonase but hasn't used that yet.    Meds, vitals, and allergies reviewed.   ROS: Per HPI unless specifically indicated in ROS section   GEN: nad, alert and oriented HEENT: mucous membranes moist NECK: supple w/o LA CV: rrr. PULM: ctab, no inc wob ABD: soft, +bs EXT: no edema SKIN: well perfused.  Nasal congestion. TM wnl OP wnl.

## 2023-09-08 NOTE — Patient Instructions (Addendum)
 Go to the lab on the way out.   If you have mychart we'll likely use that to update you.    Take care.  Glad to see you. Recheck at a yearly visit in about 6 months.  Labs ahead of time.  You should get a call about seeing pulmonary.  Could try flonase in the meantime.

## 2023-09-10 ENCOUNTER — Other Ambulatory Visit: Payer: Self-pay | Admitting: Family Medicine

## 2023-09-10 ENCOUNTER — Encounter: Payer: Self-pay | Admitting: Family Medicine

## 2023-09-10 DIAGNOSIS — E039 Hypothyroidism, unspecified: Secondary | ICD-10-CM

## 2023-09-10 DIAGNOSIS — R0981 Nasal congestion: Secondary | ICD-10-CM | POA: Insufficient documentation

## 2023-09-10 DIAGNOSIS — R5383 Other fatigue: Secondary | ICD-10-CM | POA: Insufficient documentation

## 2023-09-10 MED ORDER — LEVOTHYROXINE SODIUM 75 MCG PO TABS: 75.0000 ug | ORAL_TABLET | Freq: Every day | ORAL | 3 refills | Status: DC

## 2023-09-10 NOTE — Assessment & Plan Note (Signed)
 See notes on labs.  Refer to pulmonary for sleep apnea evaluation.

## 2023-09-10 NOTE — Assessment & Plan Note (Signed)
 Reasonable to use Flonase and update me as needed.

## 2023-09-10 NOTE — Assessment & Plan Note (Addendum)
 Continue Trulicity and metformin .  Most recent microalbumin/creatinine ratio less than 30.  Recheck periodically.  Continue work on diet and exercise.

## 2023-09-12 ENCOUNTER — Ambulatory Visit: Admitting: Family Medicine

## 2023-09-12 VITALS — BP 142/82 | HR 100 | Temp 99.8°F | Ht 63.0 in | Wt 174.0 lb

## 2023-09-12 DIAGNOSIS — R059 Cough, unspecified: Secondary | ICD-10-CM | POA: Diagnosis not present

## 2023-09-12 DIAGNOSIS — R0981 Nasal congestion: Secondary | ICD-10-CM | POA: Diagnosis not present

## 2023-09-12 LAB — POC COVID19 BINAXNOW: SARS Coronavirus 2 Ag: NEGATIVE

## 2023-09-12 LAB — POCT INFLUENZA A/B
Influenza A, POC: NEGATIVE
Influenza B, POC: NEGATIVE

## 2023-09-12 MED ORDER — FLUCONAZOLE 150 MG PO TABS
150.0000 mg | ORAL_TABLET | ORAL | 0 refills | Status: DC
Start: 2023-09-12 — End: 2024-01-11

## 2023-09-12 MED ORDER — BENZONATATE 200 MG PO CAPS: 200.0000 mg | ORAL_CAPSULE | Freq: Three times a day (TID) | ORAL | 1 refills | Status: DC | PRN

## 2023-09-12 MED ORDER — DOXYCYCLINE HYCLATE 100 MG PO TABS: 100.0000 mg | ORAL_TABLET | Freq: Two times a day (BID) | ORAL | 0 refills | Status: DC

## 2023-09-12 MED ORDER — ALBUTEROL SULFATE HFA 108 (90 BASE) MCG/ACT IN AERS: 1.0000 | INHALATION_SPRAY | Freq: Four times a day (QID) | RESPIRATORY_TRACT | 1 refills | Status: DC | PRN

## 2023-09-12 NOTE — Patient Instructions (Addendum)
 Use tessalon  and albuterol  if needed for the cough.  Rest and fluids.  If not better, then start doxycycline.  If needed, then use diflucan .  Take care.  Glad to see you.

## 2023-09-12 NOTE — Progress Notes (Unsigned)
 Prev stuffy nose but no fever at last OV.  Sx got worse 09/09/23.  Cough, fever, aches.  Fatigued.  Having diffuse tooth pain.  Sore from coughing, coming in fits.  SUI from cough.  No sputum.  Temp up to 100.9.  sweats and chills.  ST with cough.  Some nausea. Dec in appetite.  Taking fluids well.    Meds, vitals, and allergies reviewed.   ROS: Per HPI unless specifically indicated in ROS section   TM wnl Sinuses not ttp OP w/o erythema.  Nasal exam stuffy.  Neck supple, no LA Rrr Ctab No focal dec in BS Skin well perfused.  Abd not ttp. Normal BS

## 2023-09-13 NOTE — Assessment & Plan Note (Signed)
 Okay for outpatient follow-up.  Lungs are clear.  Flu and COVID test negative. Use tessalon  and albuterol  if needed for the cough.  Rest and fluids.  If not better, then start doxycycline.  If needed, then use diflucan .  Update us  as needed in clinic.  She agrees with plan.

## 2023-09-19 ENCOUNTER — Ambulatory Visit: Admitting: Family Medicine

## 2023-09-19 ENCOUNTER — Encounter: Payer: Self-pay | Admitting: Family Medicine

## 2023-09-19 ENCOUNTER — Ambulatory Visit (INDEPENDENT_AMBULATORY_CARE_PROVIDER_SITE_OTHER)
Admission: RE | Admit: 2023-09-19 | Discharge: 2023-09-19 | Disposition: A | Discharge status: Home / Self Care | Source: Ambulatory Visit | Attending: Family Medicine | Admitting: Family Medicine

## 2023-09-19 VITALS — BP 128/74 | HR 84 | Temp 99.2°F | Ht 63.0 in | Wt 172.8 lb

## 2023-09-19 DIAGNOSIS — R059 Cough, unspecified: Secondary | ICD-10-CM

## 2023-09-19 MED ORDER — HYDROCODONE BIT-HOMATROP MBR 5-1.5 MG/5ML PO SOLN: 2.5000 mL | Freq: Three times a day (TID) | ORAL | 0 refills | Status: DC | PRN

## 2023-09-19 NOTE — Progress Notes (Unsigned)
 Still on doxy, no ADE on med.    Still with cough.  Cough is some better but still bothersome.  Cough is worse at night.  Still with chills, can feel hot.  Chest still feels heavy.  Sleep disrupted.  No sputum.  Can still take a deep breath.  No fevers since last week.  She can hear noisy breathing, worse at night laying on her side.  No facial pain.  Not much post nasal gtt.  Clear rhinorrhea.    SABA doesn't help the cough.    Meds, vitals, and allergies reviewed.   ROS: Per HPI unless specifically indicated in ROS section   Nad Ncat MMM Nasal exam stuffy.  Neck supple, no LA Rrr Ctab No focal dec in BS Skin well perfused.  Abd not ttp. Normal BS

## 2023-09-19 NOTE — Patient Instructions (Signed)
 Xray on the way out.  Try hycodcan if needed. Sedation caution.  Take care.  Glad to see you.

## 2023-09-20 ENCOUNTER — Encounter: Payer: Self-pay | Admitting: Family Medicine

## 2023-09-20 NOTE — Assessment & Plan Note (Signed)
 Still okay for outpatient follow-up.  Finish doxycycline . Xray on the way out.  Try hycodcan if needed. Sedation caution.  Could use with albuterol  and/or benzonatate  if needed.  Update me as needed.

## 2023-10-03 ENCOUNTER — Ambulatory Visit (INDEPENDENT_AMBULATORY_CARE_PROVIDER_SITE_OTHER): Admitting: Sleep Medicine

## 2023-10-03 ENCOUNTER — Encounter: Payer: Self-pay | Admitting: Sleep Medicine

## 2023-10-03 VITALS — BP 130/80 | HR 94 | Temp 98.3°F | Ht 63.0 in | Wt 172.6 lb

## 2023-10-03 DIAGNOSIS — E669 Obesity, unspecified: Secondary | ICD-10-CM

## 2023-10-03 DIAGNOSIS — Z6830 Body mass index (BMI) 30.0-30.9, adult: Secondary | ICD-10-CM | POA: Diagnosis not present

## 2023-10-03 DIAGNOSIS — E66811 Obesity, class 1: Secondary | ICD-10-CM

## 2023-10-03 DIAGNOSIS — G4733 Obstructive sleep apnea (adult) (pediatric): Secondary | ICD-10-CM

## 2023-10-03 NOTE — Progress Notes (Signed)
 Name:Christy Ferrell MRN: [id number] DOB: [birth year]   CHIEF COMPLAINT:  EXCESSIVE DAYTIME SLEEPINESS   HISTORY OF PRESENT ILLNESS:  Christy Ferrell is a 53 y.o. w/ a h/o H/O breast CA, hyperlipidemia, DMII and obesity who presents for c/o loud snoring and excessive daytime sleepiness which has been present for several years. Denies nocturnal awakenings. Denies any significant weight changes. Admits to dry mouth, headaches and night sweats. Denies RLS symptoms, dream enactment, cataplexy, hypnagogic or hypnapompic hallucinations. Denies a family history of sleep apnea. Denies drowsy driving. Drinks caffeine occasionally, denies alcohol, tobacco or illicit drug use.   Bedtime 10 pm Sleep onset 10 mins Rise time 6:45 am   EPWORTH SLEEP SCORE 15    10/03/2023    4:00 PM  Results of the Epworth flowsheet  Sitting and reading 3  Watching TV 3  Sitting, inactive in a public place (e.g. a theatre or a meeting) 1  As a passenger in a car for an hour without a break 2  Lying down to rest in the afternoon when circumstances permit 3  Sitting and talking to someone 1  Sitting quietly after a lunch without alcohol 2  In a car, while stopped for a few minutes in traffic 0  Total score 15    PAST MEDICAL HISTORY :   has a past medical history of Anxiety, Breast cancer (HCC) (01/2020), Cholelithiasis, Diabetes mellitus without complication (HCC), Family history of breast cancer, Family history of leukemia, Family history of lung cancer, Family history of prostate cancer, Family history of stomach cancer, Hypothyroidism, and Major depression.  has a past surgical history that includes Cesarean section; Cholecystectomy (05/1998); Mastectomy modified radical (Right, 03/30/2020); Breast reconstruction with placement of tissue expander and flex hd (acellular hydrated dermis) (Right, 03/30/2020); Removal of tissue expander and placement of implant (Right, 05/28/2020); Breast reduction with  mastopexy (Left, 05/28/2020); and Liposuction with lipofilling (Right, 07/21/2021). Prior to Admission medications   Medication Sig Start Date End Date Taking? Authorizing Provider  levothyroxine  (SYNTHROID ) 50 MCG tablet Take 50 mcg by mouth every morning. 09/17/23  Yes [provider]  acyclovir  ointment (ZOVIRAX ) 5 % Apply 1 application topically every 3 (three) hours. As needed. 12/03/19   Donnie Galea, MD  albuterol  (VENTOLIN  HFA) 108 [phone] Base) MCG/ACT inhaler Inhale 1-2 puffs into the lungs every 6 (six) hours as needed (for cough). 09/12/23   Donnie Galea, MD  atorvastatin  (LIPITOR) 10 MG tablet Take 1 tablet (10 mg total) by mouth daily. 03/03/23   Donnie Galea, MD  augmented betamethasone  dipropionate (DIPROLENE -AF) 0.05 % cream Apply 1 Application topically 2 (two) times daily. 07/18/23   [provider]  benzonatate  (TESSALON ) 200 MG capsule Take 1 capsule (200 mg total) by mouth 3 (three) times daily as needed. 09/12/23   Donnie Galea, MD  cholecalciferol (VITAMIN D3) 25 MCG (1000 UNIT) tablet Take 1,000 Units by mouth daily.    [provider]  cyclobenzaprine  (FLEXERIL ) 10 MG tablet TAKE 1/2 TO 1 TABLET BY MOUTH EVERY DAY AS NEEDED 12/03/19   Donnie Galea, MD  doxycycline  (VIBRA -TABS) 100 MG tablet Take 1 tablet (100 mg total) by mouth 2 (two) times daily. 09/12/23   Donnie Galea, MD  Dulaglutide (TRULICITY) 0.75 MG/0.5ML SOPN INJECT 0.75 MG SUBCUTANEOUSLY ONE TIME PER WEEK 02/14/23   Donnie Galea, MD  escitalopram  (LEXAPRO ) 10 MG tablet Take 1 tablet (10 mg total) by mouth daily. 03/03/23   Richrd Char  S, MD  fluconazole  (DIFLUCAN ) 150 MG tablet Take 1 tablet (150 mg total) by mouth once a week. 09/12/23   Donnie Galea, MD  goserelin (ZOLADEX ) 3.6 MG injection Inject 3.6 mg into the skin every 28 (twenty-eight) days. 09/08/23   Donnie Galea, MD  HYDROcodone  bit-homatropine [facility]) 5-1.5 MG/5ML syrup Take 2.5-5 mLs by mouth every 8  (eight) hours as needed for cough (sedation caution). 09/19/23   Donnie Galea, MD  ibuprofen  (ADVIL ,MOTRIN ) 200 MG tablet Take 200 mg by mouth every 6 (six) hours as needed.    [provider]  metFORMIN  (GLUCOPHAGE ) 500 MG tablet Take 1 tablet (500 mg total) by mouth 2 (two) times daily with a meal. 09/08/23   Donnie Galea, MD  tamoxifen  (NOLVADEX ) 20 MG tablet TAKE 1 TABLET BY MOUTH EVERY DAY 05/18/23   Iruku, Praveena, MD  Turmeric (QC TUMERIC COMPLEX PO) Take by mouth.    [provider]  valACYclovir  (VALTREX ) 1000 MG tablet TAKE 2 TABLETS (2,000 MG TOTAL) BY MOUTH 2 (TWO) TIMES DAILY AS NEEDED. FOR 1 DAY. 04/20/22   Donnie Galea, MD   Allergies  Allergen Reactions   Codeine Nausea And Vomiting   Zoloft  [Sertraline  Hcl]     headaches    FAMILY HISTORY:  family history includes Asthma in her sister; Breast cancer (age of onset: 15) in an other family member; Cancer in her maternal grandfather; Depression in her father; Diabetes in her maternal uncle and maternal uncle; Emphysema in her paternal grandmother; Hyperlipidemia in her maternal grandmother; Hypertension in her father and maternal grandmother; Hypothyroidism in her maternal grandfather and mother; Leukemia (age of onset: 34) in her maternal uncle; Lung cancer (age of onset: 82) in her maternal grandmother; Lung disease in her maternal grandmother; Prostate cancer in her maternal grandfather and maternal uncle; Prostate cancer (age of onset: 15) in her father; Spina bifida in her sister. SOCIAL HISTORY:  reports that she has never smoked. She has never used smokeless tobacco. She reports current alcohol use. She reports that she does not use drugs.   Review of Systems:  Gen:  Denies  fever, sweats, chills weight loss  HEENT: Denies blurred vision, double vision, ear pain, eye pain, hearing loss, nose bleeds, sore throat Cardiac:  No dizziness, chest pain or heaviness, chest tightness,edema, No JVD Resp:    No cough, -sputum production, -shortness of breath,-wheezing, -hemoptysis,  Gi: Denies swallowing difficulty, stomach pain, nausea or vomiting, diarrhea, constipation, bowel incontinence Gu:  Denies bladder incontinence, burning urine Ext:   Denies Joint pain, stiffness or swelling Skin: Denies  skin rash, easy bruising or bleeding or hives Endoc:  Denies polyuria, polydipsia , polyphagia or weight change Psych:   Denies depression, insomnia or hallucinations  Other:  All other systems negative  VITAL SIGNS: BP 130/80 (BP Location: Right Arm, Patient Position: Sitting, Cuff Size: Large)   Pulse 94   Temp 98.3 F (36.8 C) (Oral)   Ht 5\' 3"  (1.6 m)   Wt 172 lb 9.6 oz (78.3 kg)   SpO2 97%   BMI 30.57 kg/m     Physical Examination:   General Appearance: No distress  EYES PERRLA, EOM intact.   NECK Supple, No JVD Pulmonary: normal breath sounds, No wheezing.  CardiovascularNormal S1,S2.  No m/r/g.   Abdomen: Benign, Soft, non-tender. Skin:   warm, no rashes, no ecchymosis  Extremities: normal, no cyanosis, clubbing. Neuro:without focal findings,  speech normal  PSYCHIATRIC: Mood, affect within normal limits.  ASSESSMENT AND PLAN  OSA I suspect that OSA is likely present due to clinical presentation. Discussed the consequences of untreated sleep apnea. Advised not to drive drowsy for safety of patient and others. Will complete further evaluation with a home sleep study and follow up to review results.    Obesity Counseled patient on diet and lifestyle modification.    MEDICATION ADJUSTMENTS/LABS AND TESTS ORDERED: Recommend Sleep Study   Patient  satisfied with Plan of action and management. All questions answered  Follow up to review HST results and treatment plan.   I spent a total of 30 minutes reviewing chart data, face-to-face evaluation with the patient, counseling and coordination of care as detailed above.    Ahamed Hofland, M.D.  [facility]

## 2023-10-03 NOTE — Patient Instructions (Signed)
 [name]

## 2023-10-06 ENCOUNTER — Inpatient Hospital Stay: Payer: 59 | Attending: Hematology and Oncology

## 2023-10-06 VITALS — BP 138/88 | HR 84 | Temp 98.0°F | Resp 17 | Wt 173.5 lb

## 2023-10-06 DIAGNOSIS — E119 Type 2 diabetes mellitus without complications: Secondary | ICD-10-CM | POA: Insufficient documentation

## 2023-10-06 DIAGNOSIS — Z803 Family history of malignant neoplasm of breast: Secondary | ICD-10-CM | POA: Insufficient documentation

## 2023-10-06 DIAGNOSIS — Z79810 Long term (current) use of selective estrogen receptor modulators (SERMs): Secondary | ICD-10-CM | POA: Insufficient documentation

## 2023-10-06 DIAGNOSIS — Z7985 Long-term (current) use of injectable non-insulin antidiabetic drugs: Secondary | ICD-10-CM | POA: Diagnosis not present

## 2023-10-06 DIAGNOSIS — Z170 Estrogen receptor positive status [ER+]: Secondary | ICD-10-CM | POA: Diagnosis not present

## 2023-10-06 DIAGNOSIS — Z9011 Acquired absence of right breast and nipple: Secondary | ICD-10-CM | POA: Insufficient documentation

## 2023-10-06 DIAGNOSIS — Z801 Family history of malignant neoplasm of trachea, bronchus and lung: Secondary | ICD-10-CM | POA: Insufficient documentation

## 2023-10-06 DIAGNOSIS — C50511 Malignant neoplasm of lower-outer quadrant of right female breast: Secondary | ICD-10-CM | POA: Diagnosis present

## 2023-10-06 DIAGNOSIS — Z800 Family history of malignant neoplasm of digestive organs: Secondary | ICD-10-CM | POA: Diagnosis not present

## 2023-10-06 DIAGNOSIS — Z806 Family history of leukemia: Secondary | ICD-10-CM | POA: Insufficient documentation

## 2023-10-06 DIAGNOSIS — Z923 Personal history of irradiation: Secondary | ICD-10-CM | POA: Insufficient documentation

## 2023-10-06 MED ORDER — GOSERELIN ACETATE 3.6 MG ~~LOC~~ IMPL
3.6000 mg | DRUG_IMPLANT | Freq: Once | SUBCUTANEOUS | Status: AC
  Administered 2023-10-06: 3.6 mg via SUBCUTANEOUS
  Filled 2023-10-06: qty 3.6

## 2023-10-06 NOTE — Patient Instructions (Signed)
 Goserelin Implant What is this medication? GOSERELIN (GOE se rel in) treats prostate cancer and breast cancer. It works by decreasing levels of the hormones testosterone and estrogen in the body. This prevents prostate and breast cancer cells from spreading or growing. It may also be used to treat endometriosis. This is a condition where the tissue that lines the uterus grows outside the uterus. It works by decreasing the amount of estrogen your body makes, which reduces heavy bleeding and pain. It can also be used to help thin the lining of the uterus before a surgery used to prevent or reduce heavy periods. This medicine may be used for other purposes; ask your health care provider or pharmacist if you have questions. COMMON BRAND NAME(S): Zoladex, Zoladex 76-Month What should I tell my care team before I take this medication? They need to know if you have any of these conditions: Bone problems Diabetes Heart disease History of irregular heartbeat or rhythm An unusual or allergic reaction to goserelin, other medications, foods, dyes, or preservatives Pregnant or trying to get pregnant Breastfeeding How should I use this medication? This medication is injected under the skin. It is given by your care team in a hospital or clinic setting. Talk to your care team about the use of this medication in children. Special care may be needed. Overdosage: If you think you have taken too much of this medicine contact a poison control center or emergency room at once. NOTE: This medicine is only for you. Do not share this medicine with others. What if I miss a dose? Keep appointments for follow-up doses. It is important not to miss your dose. Call your care team if you are unable to keep an appointment. What may interact with this medication? Do not take this medication with any of the following: Cisapride Dronedarone Pimozide Thioridazine This medication may also interact with the following: Other  medications that cause heart rhythm changes This list may not describe all possible interactions. Give your health care provider a list of all the medicines, herbs, non-prescription drugs, or dietary supplements you use. Also tell them if you smoke, drink alcohol, or use illegal drugs. Some items may interact with your medicine. What should I watch for while using this medication? Visit your care team for regular checks on your progress. Your symptoms may appear to get worse during the first weeks of this therapy. Tell your care team if your symptoms do not start to get better or if they get worse after this time. Using this medication for a long time may weaken your bones. If you smoke or frequently drink alcohol you may increase your risk of bone loss. A family history of osteoporosis, chronic use of medications for seizures (convulsions), or corticosteroids can also increase your risk of bone loss. The risk of bone fractures may be increased. Talk to your care team about your bone health. This medication may increase blood sugar. The risk may be higher in patients who already have diabetes. Ask your care team what you can do to lower your risk of diabetes while taking this medication. This medication should stop regular monthly menstruation in women. Tell your care team if you continue to menstruate. Talk to your care team if you wish to become pregnant or think you might be pregnant. This medication can cause serious birth defects if taken during pregnancy or for 12 weeks after stopping treatment. Talk to your care team about reliable forms of contraception. Do not breastfeed while taking this  medication. This medication may cause infertility. Talk to your care team if you are concerned about your fertility. What side effects may I notice from receiving this medication? Side effects that you should report to your care team as soon as possible: Allergic reactions--skin rash, itching, hives, swelling  of the face, lips, tongue, or throat Change in the amount of urine Heart attack--pain or tightness in the chest, shoulders, arms, or jaw, nausea, shortness of breath, cold or clammy skin, feeling faint or lightheaded Heart rhythm changes--fast or irregular heartbeat, dizziness, feeling faint or lightheaded, chest pain, trouble breathing High blood sugar (hyperglycemia)--increased thirst or amount of urine, unusual weakness or fatigue, blurry vision High calcium level--increased thirst or amount of urine, nausea, vomiting, confusion, unusual weakness or fatigue, bone pain Pain, redness, irritation, or bruising at the injection site Severe back pain, numbness or weakness of the hands, arms, legs, or feet, loss of coordination, loss of bowel or bladder control Stroke--sudden numbness or weakness of the face, arm, or leg, trouble speaking, confusion, trouble walking, loss of balance or coordination, dizziness, severe headache, change in vision Swelling and pain of the tumor site or lymph nodes Trouble passing urine Side effects that usually do not require medical attention (report to your care team if they continue or are bothersome): Change in sex drive or performance Headache Hot flashes Rapid or extreme change in emotion or mood Sweating Swelling of the ankles, hands, or feet Unusual vaginal discharge, itching, or odor This list may not describe all possible side effects. Call your doctor for medical advice about side effects. You may report side effects to FDA at 1-800-FDA-1088. Where should I keep my medication? This medication is given in a hospital or clinic. It will not be stored at home. NOTE: This sheet is a summary. It may not cover all possible information. If you have questions about this medicine, talk to your doctor, pharmacist, or health care provider.  2024 [employer]/Gold Standard (2021-09-23 00:00:00)

## 2023-10-16 ENCOUNTER — Encounter

## 2023-10-16 DIAGNOSIS — G4733 Obstructive sleep apnea (adult) (pediatric): Secondary | ICD-10-CM

## 2023-10-23 ENCOUNTER — Telehealth: Payer: Self-pay | Admitting: Sleep Medicine

## 2023-10-23 NOTE — Telephone Encounter (Signed)
 Patient needs appointment for CPAP results in 3 weeks.

## 2023-11-01 NOTE — Telephone Encounter (Signed)
 Patient needs appointment for cpap results in 3 weeks.

## 2023-11-03 ENCOUNTER — Inpatient Hospital Stay: Payer: 59 | Attending: Hematology and Oncology

## 2023-11-03 DIAGNOSIS — Z7985 Long-term (current) use of injectable non-insulin antidiabetic drugs: Secondary | ICD-10-CM | POA: Insufficient documentation

## 2023-11-03 DIAGNOSIS — Z79810 Long term (current) use of selective estrogen receptor modulators (SERMs): Secondary | ICD-10-CM | POA: Insufficient documentation

## 2023-11-03 DIAGNOSIS — Z800 Family history of malignant neoplasm of digestive organs: Secondary | ICD-10-CM | POA: Insufficient documentation

## 2023-11-03 DIAGNOSIS — C50511 Malignant neoplasm of lower-outer quadrant of right female breast: Secondary | ICD-10-CM | POA: Diagnosis present

## 2023-11-03 DIAGNOSIS — Z9011 Acquired absence of right breast and nipple: Secondary | ICD-10-CM | POA: Diagnosis not present

## 2023-11-03 DIAGNOSIS — Z806 Family history of leukemia: Secondary | ICD-10-CM | POA: Insufficient documentation

## 2023-11-03 DIAGNOSIS — E119 Type 2 diabetes mellitus without complications: Secondary | ICD-10-CM | POA: Diagnosis not present

## 2023-11-03 DIAGNOSIS — Z170 Estrogen receptor positive status [ER+]: Secondary | ICD-10-CM | POA: Insufficient documentation

## 2023-11-03 DIAGNOSIS — Z803 Family history of malignant neoplasm of breast: Secondary | ICD-10-CM | POA: Diagnosis not present

## 2023-11-03 DIAGNOSIS — Z801 Family history of malignant neoplasm of trachea, bronchus and lung: Secondary | ICD-10-CM | POA: Diagnosis not present

## 2023-11-03 DIAGNOSIS — Z923 Personal history of irradiation: Secondary | ICD-10-CM | POA: Diagnosis not present

## 2023-11-03 MED ORDER — GOSERELIN ACETATE 3.6 MG ~~LOC~~ IMPL
3.6000 mg | DRUG_IMPLANT | Freq: Once | SUBCUTANEOUS | Status: AC
  Administered 2023-11-03: 3.6 mg via SUBCUTANEOUS
  Filled 2023-11-03: qty 3.6

## 2023-11-13 ENCOUNTER — Other Ambulatory Visit: Payer: Self-pay | Admitting: Hematology and Oncology

## 2023-11-13 ENCOUNTER — Other Ambulatory Visit: Payer: Self-pay | Admitting: Family Medicine

## 2023-11-13 DIAGNOSIS — Z170 Estrogen receptor positive status [ER+]: Secondary | ICD-10-CM

## 2023-11-13 DIAGNOSIS — C50919 Malignant neoplasm of unspecified site of unspecified female breast: Secondary | ICD-10-CM

## 2023-11-23 ENCOUNTER — Telehealth: Payer: Self-pay | Admitting: Hematology and Oncology

## 2023-11-23 NOTE — Telephone Encounter (Signed)
 Left patient a vm regarding upcoming appointment changes

## 2023-12-01 ENCOUNTER — Inpatient Hospital Stay: Payer: 59 | Attending: Hematology and Oncology

## 2023-12-01 ENCOUNTER — Inpatient Hospital Stay: Payer: 59 | Admitting: Hematology and Oncology

## 2023-12-01 VITALS — BP 133/82 | HR 78 | Temp 98.9°F | Resp 18

## 2023-12-01 DIAGNOSIS — Z79810 Long term (current) use of selective estrogen receptor modulators (SERMs): Secondary | ICD-10-CM | POA: Diagnosis not present

## 2023-12-01 DIAGNOSIS — Z800 Family history of malignant neoplasm of digestive organs: Secondary | ICD-10-CM | POA: Insufficient documentation

## 2023-12-01 DIAGNOSIS — E119 Type 2 diabetes mellitus without complications: Secondary | ICD-10-CM | POA: Insufficient documentation

## 2023-12-01 DIAGNOSIS — Z9011 Acquired absence of right breast and nipple: Secondary | ICD-10-CM | POA: Diagnosis not present

## 2023-12-01 DIAGNOSIS — C50511 Malignant neoplasm of lower-outer quadrant of right female breast: Secondary | ICD-10-CM | POA: Insufficient documentation

## 2023-12-01 DIAGNOSIS — Z923 Personal history of irradiation: Secondary | ICD-10-CM | POA: Diagnosis not present

## 2023-12-01 DIAGNOSIS — Z806 Family history of leukemia: Secondary | ICD-10-CM | POA: Insufficient documentation

## 2023-12-01 DIAGNOSIS — Z170 Estrogen receptor positive status [ER+]: Secondary | ICD-10-CM | POA: Insufficient documentation

## 2023-12-01 DIAGNOSIS — Z803 Family history of malignant neoplasm of breast: Secondary | ICD-10-CM | POA: Insufficient documentation

## 2023-12-01 DIAGNOSIS — Z801 Family history of malignant neoplasm of trachea, bronchus and lung: Secondary | ICD-10-CM | POA: Diagnosis not present

## 2023-12-01 DIAGNOSIS — Z7985 Long-term (current) use of injectable non-insulin antidiabetic drugs: Secondary | ICD-10-CM | POA: Diagnosis not present

## 2023-12-01 MED ORDER — GOSERELIN ACETATE 3.6 MG ~~LOC~~ IMPL
3.6000 mg | DRUG_IMPLANT | Freq: Once | SUBCUTANEOUS | Status: AC
  Administered 2023-12-01: 3.6 mg via SUBCUTANEOUS
  Filled 2023-12-01: qty 3.6

## 2023-12-10 ENCOUNTER — Other Ambulatory Visit: Payer: Self-pay | Admitting: Family Medicine

## 2023-12-29 ENCOUNTER — Inpatient Hospital Stay

## 2023-12-29 ENCOUNTER — Other Ambulatory Visit: Payer: Self-pay

## 2023-12-29 ENCOUNTER — Inpatient Hospital Stay: Attending: Hematology and Oncology | Admitting: Hematology and Oncology

## 2023-12-29 VITALS — BP 120/76 | HR 87 | Temp 98.0°F | Resp 18 | Ht 63.0 in | Wt 170.4 lb

## 2023-12-29 DIAGNOSIS — Z806 Family history of leukemia: Secondary | ICD-10-CM | POA: Diagnosis not present

## 2023-12-29 DIAGNOSIS — R5383 Other fatigue: Secondary | ICD-10-CM | POA: Insufficient documentation

## 2023-12-29 DIAGNOSIS — Z79810 Long term (current) use of selective estrogen receptor modulators (SERMs): Secondary | ICD-10-CM | POA: Insufficient documentation

## 2023-12-29 DIAGNOSIS — Z923 Personal history of irradiation: Secondary | ICD-10-CM | POA: Insufficient documentation

## 2023-12-29 DIAGNOSIS — C50511 Malignant neoplasm of lower-outer quadrant of right female breast: Secondary | ICD-10-CM | POA: Insufficient documentation

## 2023-12-29 DIAGNOSIS — Z170 Estrogen receptor positive status [ER+]: Secondary | ICD-10-CM

## 2023-12-29 DIAGNOSIS — Z800 Family history of malignant neoplasm of digestive organs: Secondary | ICD-10-CM | POA: Insufficient documentation

## 2023-12-29 DIAGNOSIS — Z9011 Acquired absence of right breast and nipple: Secondary | ICD-10-CM | POA: Insufficient documentation

## 2023-12-29 DIAGNOSIS — R232 Flushing: Secondary | ICD-10-CM | POA: Insufficient documentation

## 2023-12-29 DIAGNOSIS — Z801 Family history of malignant neoplasm of trachea, bronchus and lung: Secondary | ICD-10-CM | POA: Diagnosis not present

## 2023-12-29 DIAGNOSIS — Z803 Family history of malignant neoplasm of breast: Secondary | ICD-10-CM | POA: Diagnosis not present

## 2023-12-29 DIAGNOSIS — E119 Type 2 diabetes mellitus without complications: Secondary | ICD-10-CM | POA: Insufficient documentation

## 2023-12-29 DIAGNOSIS — Z7985 Long-term (current) use of injectable non-insulin antidiabetic drugs: Secondary | ICD-10-CM | POA: Diagnosis not present

## 2023-12-29 LAB — CMP (CANCER CENTER ONLY)
ALT: 26 U/L (ref 0–44)
AST: 37 U/L (ref 15–41)
Albumin: 4.2 g/dL (ref 3.5–5.0)
Alkaline Phosphatase: 55 U/L (ref 38–126)
Anion gap: 7 (ref 5–15)
BUN: 17 mg/dL (ref 6–20)
CO2: 28 mmol/L (ref 22–32)
Calcium: 9.5 mg/dL (ref 8.9–10.3)
Chloride: 106 mmol/L (ref 98–111)
Creatinine: 0.73 mg/dL (ref 0.44–1.00)
GFR, Estimated: >60 mL/min (ref >60)
Glucose, Bld: 128 mg/dL — ABNORMAL HIGH (ref 70–99)
Potassium: 4 mmol/L (ref 3.5–5.1)
Sodium: 141 mmol/L (ref 135–145)
Total Bilirubin: 0.4 mg/dL (ref 0.0–1.2)
Total Protein: 7.2 g/dL (ref 6.5–8.1)

## 2023-12-29 LAB — CBC WITH DIFFERENTIAL (CANCER CENTER ONLY)
Abs Immature Granulocytes: 0.03 K/uL (ref 0.00–0.07)
Basophils Absolute: 0 K/uL (ref 0.0–0.1)
Basophils Relative: 0 %
Eosinophils Absolute: 0.1 K/uL (ref 0.0–0.5)
Eosinophils Relative: 1 %
HCT: 38.6 % (ref 36.0–46.0)
Hemoglobin: 13.7 g/dL (ref 12.0–15.0)
Immature Granulocytes: 0 %
Lymphocytes Relative: 33 %
Lymphs Abs: 3.6 K/uL (ref 0.7–4.0)
MCH: 30.8 pg (ref 26.0–34.0)
MCHC: 35.5 g/dL (ref 30.0–36.0)
MCV: 86.7 fL (ref 80.0–100.0)
Monocytes Absolute: 0.6 K/uL (ref 0.1–1.0)
Monocytes Relative: 5 %
Neutro Abs: 6.6 K/uL (ref 1.7–7.7)
Neutrophils Relative %: 61 %
Platelet Count: 255 K/uL (ref 150–400)
RBC: 4.45 MIL/uL (ref 3.87–5.11)
RDW: 13.4 % (ref 11.5–15.5)
WBC Count: 10.9 K/uL — ABNORMAL HIGH (ref 4.0–10.5)
nRBC: 0 % (ref 0.0–0.2)

## 2023-12-29 MED ORDER — GOSERELIN ACETATE 3.6 MG ~~LOC~~ IMPL
3.6000 mg | DRUG_IMPLANT | Freq: Once | SUBCUTANEOUS | Status: AC
  Administered 2023-12-29: 3.6 mg via SUBCUTANEOUS
  Filled 2023-12-29: qty 3.6

## 2023-12-29 NOTE — Progress Notes (Signed)
 [facility]  Telephone:([phone] Fax:([phone]) [phone]     ID: ARLYNE BRANDES DOB: Jun 29, 1970  MR#: [id number]  RDW#:[id number]  Patient Care Team: Cleatus Arlyss RAMAN, MD as PCP - General (Family Medicine) Glean Stephane BROCKS, RN (Inactive) as Oncology Nurse Navigator Tyree Nanetta SAILOR, RN as Oncology Nurse Navigator Curvin Deward MOULD, MD as Consulting Physician (General Surgery) Dillingham, Estefana RAMAN, DO as Attending Physician (Plastic Surgery) Amber Stalls, MD OTHER MD:  CHIEF COMPLAINT: Estrogen receptor positive breast cancer (s/p right mastectomy)  CURRENT TREATMENT: tamoxifen , goserelin  INTERVAL HISTORY:  History of Present Illness    Discussed the use of AI scribe software for clinical note transcription with the patient, who gave verbal consent to proceed.  History of Present Illness Christy Ferrell is a 53 year old female with breast cancer and diabetes who presents for a follow-up visit.  She is undergoing treatment for breast cancer with tamoxifen  since 2021 and monthly Zoladex  injections. When not on Zoladex , she experiences irregular bleeding, though not a full menstrual cycle. She experiences side effects such as fatigue and hot flashes, and struggles with inadequate sleep. No breathing issues or swelling. Her last mammogram was in June of the previous year.  Her diabetes is managed with metformin  and Trulicity. She has not had recent blood work to check her hemoglobin A1c. No significant changes in her diabetes management since her last visit.  She has noticed some weight loss, which she attributes to stress related to her son's upcoming wedding and her mother's need for surgery. She is preparing for her son's wedding in [place] and mentions the stress associated with travel and family responsibilities.  Rest of the pertinent 10 point ROS reviewed and negative  REVIEW OF SYSTEMS:   COVID 19 VACCINATION STATUS: Not vaccinated as of December  2022   HISTORY OF CURRENT ILLNESS: From the original visit note:  Christy Ferrell herself palpated a right breast mass. She underwent bilateral diagnostic mammography with tomography and right breast ultrasonography at [facility] on 01/15/2020 showing: breast density category B; palpable 1.6 cm mass in right breast at 5:30, with adjacent 5 mm probable satellite mass; 0.9 cm mass in retroareolar right breast, also at 5:30; two enlarged lymph nodes in right breast at 9:30.  Accordingly on 01/17/2020 she proceeded to biopsy of the right breast areas in question. The pathology from this procedure ([id number]) showed:  1. Right Breast, 5:30  - invasive mammary carcinoma, grade 2  - ductal carcinoma in situ, low grade.   - Prognostic indicators significant for: estrogen receptor, 95% positive and progesterone receptor, 95% positive, both with strong staining intensity. Proliferation marker Ki67 not obtained. HER2 negative by immunohistochemistry (0). 2. Right Breast, retroareolar  - ductal carcinoma in situ 3. Lymph Node, right axilla  - invasive mammary carcinoma  - lymph node tissue not identified  We do not have an e-cadherin or Ki67 on this tissue.  The patient's subsequent history is as detailed below.   PAST MEDICAL HISTORY: Past Medical History:  Diagnosis Date   Anxiety    h/o, was prev on zoloft    Breast cancer (HCC) 01/2020   seeing Dr. Curvin   Cholelithiasis    Diabetes mellitus without complication [facility])    Family history of breast cancer    Family history of leukemia    Family history of lung cancer    Family history of prostate cancer    Family history of stomach cancer    Hypothyroidism  postpartum hypothyroidism   Major depression     PAST SURGICAL HISTORY: Past Surgical History:  Procedure Laterality Date   BREAST RECONSTRUCTION WITH PLACEMENT OF TISSUE EXPANDER AND FLEX HD (ACELLULAR HYDRATED DERMIS) Right 03/30/2020   Procedure: BREAST  RECONSTRUCTION WITH PLACEMENT OF TISSUE EXPANDER AND FLEX HD (ACELLULAR HYDRATED DERMIS);  Surgeon: Lowery Estefana RAMAN, DO;  Location: [facility];  Service: Plastics;  Laterality: Right;   BREAST REDUCTION WITH MASTOPEXY Left 05/28/2020   Procedure: BREAST REDUCTION WITH MASTOPEXY;  Surgeon: Lowery Estefana RAMAN, DO;  Location: [facility];  Service: Plastics;  Laterality: Left;   CESAREAN SECTION     X 2 Fetal stress, second scheduled   CHOLECYSTECTOMY  05/1998   LIPOSUCTION WITH LIPOFILLING Right 07/21/2021   Procedure: LIPOSUCTION WITH LIPOFILLING OF RIGHT BREAST;  Surgeon: Lowery Estefana RAMAN, DO;  Location: [facility];  Service: Plastics;  Laterality: Right;   MASTECTOMY MODIFIED RADICAL Right 03/30/2020   Procedure: RIGHT MASTECTOMY MODIFIED RADICAL;  Surgeon: Curvin Deward MOULD, MD;  Location: [facility];  Service: General;  Laterality: Right;  PEC BLOCK, RNFA   REMOVAL OF TISSUE EXPANDER AND PLACEMENT OF IMPLANT Right 05/28/2020   Procedure: REMOVAL OF TISSUE EXPANDER AND PLACEMENT OF IMPLANT;  Surgeon: Lowery Estefana RAMAN, DO;  Location: [facility];  Service: Plastics;  Laterality: Right;  2.5 hours, please    FAMILY HISTORY: Family History  Problem Relation Age of Onset   Hypertension Father    Depression Father    Prostate cancer Father 45       Prostate, S/P prostatectomy, metastatic   Asthma Sister    Spina bifida Sister    Hypothyroidism Mother    Hypertension Maternal Grandmother    Hyperlipidemia Maternal Grandmother    Lung disease Maternal Grandmother    Lung cancer Maternal Grandmother 59   Hypothyroidism Maternal Grandfather    Prostate cancer Maternal Grandfather    Cancer Maternal Grandfather        lung, stomach, and prostate cancers   Emphysema Paternal Grandmother    Diabetes Maternal Uncle    Diabetes Maternal Uncle    Leukemia Maternal Uncle 47   Prostate cancer Maternal Uncle         dx in his 60s/70s, metastatic   Breast cancer Other 45       negative genetic testing; maternal first cousin   Stroke Neg Hx    Colon cancer Neg Hx    The patient's father died at age 52 from prostate cancer.  The patient's mother is 66 years old as of September 2021.  The patient has 1 uncle with pancreatic cancer and one cousin on the same side of the family with breast cancer.   GYNECOLOGIC HISTORY:  No LMP recorded. Patient is postmenopausal. Menarche: 53 years old Age at first live birth: 53 years old GX P 2 Contraceptive Mirena in place Hysterectomy?  No BSO?  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  She is divorced.  She lives alone although her significant other Oneil Shams (who is disabled secondary to COPD) sometimes comes over.  Her sons are Roberts (24), who is studying dentistry at [employer], and [place] (21), who is also at [employer] considering dentistry or physical therapy.   ADVANCED DIRECTIVES: Not in place.  The patient intends to name her older son as her healthcare power of attorney and  the appropriate documents were giving her on 01/29/2020 for her to complete and notarize at her discretion   HEALTH MAINTENANCE: Social History   Tobacco Use   Smoking status: Never   Smokeless tobacco: Never  Vaping Use   Vaping status: Never Used  Substance Use Topics   Alcohol use: Yes    Alcohol/week: 0.0 standard drinks of alcohol    Comment: Very rarely.   Drug use: No     Colonoscopy:   PAP: Not up-to-date  Bone density: Never   Allergies  Allergen Reactions   Codeine Nausea And Vomiting   Zoloft  [Sertraline  Hcl]     headaches    Current Outpatient Medications  Medication Sig Dispense Refill   acyclovir  ointment (ZOVIRAX ) 5 % Apply 1 application topically every 3 (three) hours. As needed. 5 g 5   atorvastatin  (LIPITOR) 10 MG tablet Take 1 tablet (10 mg  total) by mouth daily. 90 tablet 3   augmented betamethasone  dipropionate (DIPROLENE -AF) 0.05 % cream Apply 1 Application topically 2 (two) times daily.     cholecalciferol (VITAMIN D3) 25 MCG (1000 UNIT) tablet Take 1,000 Units by mouth daily.     cyclobenzaprine  (FLEXERIL ) 10 MG tablet TAKE 1/2 TO 1 TABLET BY MOUTH EVERY DAY AS NEEDED 30 tablet 5   Dulaglutide (TRULICITY) 0.75 MG/0.5ML SOPN INJECT 0.75 MG SUBCUTANEOUSLY ONE TIME PER WEEK 6 mL 2   escitalopram  (LEXAPRO ) 10 MG tablet Take 1 tablet (10 mg total) by mouth daily. 90 tablet 3   fluconazole  (DIFLUCAN ) 150 MG tablet Take 1 tablet (150 mg total) by mouth once a week. 3 tablet 0   goserelin (ZOLADEX ) 3.6 MG injection Inject 3.6 mg into the skin every 28 (twenty-eight) days.     HYDROcodone  bit-homatropine (HYCODAN) 5-1.5 MG/5ML syrup Take 2.5-5 mLs by mouth every 8 (eight) hours as needed for cough (sedation caution). 75 mL 0   ibuprofen  (ADVIL ,MOTRIN ) 200 MG tablet Take 200 mg by mouth every 6 (six) hours as needed.     levothyroxine  (SYNTHROID ) 50 MCG tablet Take 50 mcg by mouth every morning.     metFORMIN  (GLUCOPHAGE ) 500 MG tablet TAKE 1 TABLET (500 MG) DAILY AND THEN INCREASE TO TWICE A DAY AFTER 2 WEEKS IF TOLERATED) 60 tablet 5   tamoxifen  (NOLVADEX ) 20 MG tablet TAKE 1 TABLET BY MOUTH EVERY DAY 30 tablet 5   Turmeric (QC TUMERIC COMPLEX PO) Take by mouth.     valACYclovir  (VALTREX ) 1000 MG tablet TAKE 2 TABLETS (2,000 MG TOTAL) BY MOUTH 2 (TWO) TIMES DAILY AS NEEDED. FOR 1 DAY. 20 tablet 5   No current facility-administered medications for this visit.    OBJECTIVE: White woman who appears well  Vitals:   12/29/23 1259  BP: 120/76  Pulse: 87  Resp: 18  Temp: 98 F (36.7 C)  SpO2: 96%     Body mass index is 30.19 kg/m.   Wt Readings from Last 3 Encounters:  12/29/23 170 lb 6.4 oz (77.3 kg)  10/06/23 173 lb 8 oz (78.7 kg)  10/03/23 172 lb 9.6 oz (78.3 kg)     ECOG FS:1 - Symptomatic but completely  ambulatory  Physical Exam Constitutional:      Appearance: Normal appearance.  Chest:     Comments: Right sided mastectomy with reconstruction.  No palpable masses or regional adenopathy.  Left breast normal to inspection and palpation.  No regional adenopathy. Musculoskeletal:        General: No swelling.     Cervical back: Normal  range of motion and neck supple. No rigidity or tenderness.  Skin:    General: Skin is warm and dry.  Neurological:     General: No focal deficit present.     Mental Status: She is alert.       LAB RESULTS:  CMP     Component Value Date/Time   NA 141 10/03/2022 1314   K 4.0 10/03/2022 1314   CL 105 10/03/2022 1314   CO2 26 10/03/2022 1314   GLUCOSE 149 (H) 10/03/2022 1314   BUN 14 10/03/2022 1314   CREATININE 0.70 10/03/2022 1314   CREATININE 0.71 07/15/2022 1518   CALCIUM  9.1 10/03/2022 1314   PROT 7.1 10/03/2022 1314   ALBUMIN 4.1 10/03/2022 1314   AST 41 10/03/2022 1314   ALT 30 10/03/2022 1314   ALKPHOS 60 10/03/2022 1314   BILITOT 0.5 10/03/2022 1314   GFRNONAA >60 10/03/2022 1314   GFRAA >60 01/29/2020 1526    No results found for: TOTALPROTELP, ALBUMINELP, A1GS, A2GS, BETS, BETA2SER, GAMS, MSPIKE, SPEI  Lab Results  Component Value Date   WBC 11.1 (H) 09/08/2023   NEUTROABS 6,560 09/08/2023   HGB 13.7 09/08/2023   HCT 41.0 09/08/2023   MCV 89.5 09/08/2023   PLT 241 09/08/2023    No results found for: LABCA2  No components found for: OJARJW874  No results for input(s): INR in the last 168 hours.  No results found for: LABCA2  No results found for: RJW800  No results found for: CAN125  No results found for: CAN153  No results found for: CA2729  No components found for: HGQUANT  No results found for: CEA1, CEA / No results found for: CEA1, CEA   No results found for: AFPTUMOR  No results found for: CHROMOGRNA  No results found for: KPAFRELGTCHN,  LAMBDASER, KAPLAMBRATIO (kappa/lambda light chains)  No results found for: HGBA, HGBA2QUANT, HGBFQUANT, HGBSQUAN (Hemoglobinopathy evaluation)   No results found for: LDH  No results found for: IRON, TIBC, IRONPCTSAT (Iron and TIBC)  No results found for: FERRITIN  Urinalysis    Component Value Date/Time   BILIRUBINUR negative 02/25/2015 1645   PROTEINUR + 0.15 02/25/2015 1645   UROBILINOGEN 0.2 02/25/2015 1645   NITRITE negative 02/25/2015 1645   LEUKOCYTESUR small (1+) (A) 02/25/2015 1645    STUDIES: No results found.   ELIGIBLE FOR AVAILABLE RESEARCH PROTOCOL: AET  ASSESSMENT:   53 y.o. [place] woman status post right breast lower outer quadrant biopsy 01/17/2020  for a clinical T1c N1, stage IB invasive mammary carcinoma, grade 2, estrogen and progesterone receptor positive, HER-2 not amplified  (a) biopsy of the left axillary lymph node was positive but included no lymph node tissue  (b) biopsy of a suspicious periareoral area was positive for ductal carcinoma in situ, grade 1  (c) biopsy of a left breast LOQ mass noted on 02/06/2020   (d) CT chest and bone scan 02/11/2020 show no evidence of metastatic disease  (1) genetics testing 02/15/2020 through the [facility] Common Hereditary Cancers Panel.  Found no deleterious mutations in APC, ATM, AXIN2, BARD1, BMPR1A, BRCA1, BRCA2, BRIP1, CDH1, CDK4, CDKN2A (p14ARF), CDKN2A (p16INK4a), CHEK2, CTNNA1, DICER1, EPCAM (Deletion/duplication testing only), GREM1 (promoter region deletion/duplication testing only), KIT, MEN1, MLH1, MSH2, MSH3, MSH6, MUTYH, NBN, NF1, NHTL1, PALB2, PDGFRA, PMS2, POLD1, POLE, PTEN, RAD50, RAD51C, RAD51D, RNF43, SDHB, SDHC, SDHD, SMAD4, SMARCA4. STK11, TP53, TSC1, TSC2, and VHL.  The following genes were evaluated for sequence changes only: SDHA and HOXB13 c.251G>A variant only.   (2)  MammaPrint obtained from the 01/17/2020 biopsy read as low risk predicting a 93% chance of  distant disease free survival at 5 years without the need for chemotherapy  (3) tamoxifen  started 02/13/2020 in anticipation of surgical delays  (a) started ovarian suppression 04/30/2020 with monthly goserelin/Zoladex   (4) status post right modified radical mastectomy 03/30/2020 for a pT3 pN1, stage IIA invasive ductal carcinoma, grade 2, with evidence of lymphovascular space involvement but negative margins  (a) a total of 9 right axillary lymph nodes removed, 2 positive with focal extracapsular extension  (b) status post right breast reconstruction 05/28/2020 with a [employer] Smooth Round Ultra High Profile Gel 650 cc. Ref #[id number].  Serial Number [id number]; left mastopexy same date  (5) adjuvant radiation completed 08/25/2020   PLAN: Assessment and Plan Assessment & Plan Breast cancer on adjuvant endocrine therapy Breast cancer managed with tamoxifen  and Zoladex . Tolerating therapy well with common side effects. Monthly Zoladex  prevents breakthrough cycles. - Continue tamoxifen  therapy. - Administer Zoladex  injections monthly. - Ensure tamoxifen  prescription is refilled. - Schedule mammogram of the left breast.  Type 2 diabetes mellitus on oral and injectable therapy Type 2 diabetes mellitus managed with metformin  and Trulicity. Recent glycemic control not assessed. - Order hemoglobin A1c test to assess glycemic control.   Total encounter time 30 minutes.**Total Encounter Time as defined by the Centers for Medicare and Medicaid Services includes, in addition to the face-to-face time of a patient visit (documented in the note above) non-face-to-face time: obtaining and reviewing outside history, ordering and reviewing medications, tests or procedures, care coordination (communications with other health care professionals or caregivers) and documentation in the medical record.

## 2023-12-30 ENCOUNTER — Encounter: Payer: Self-pay | Admitting: Hematology and Oncology

## 2023-12-30 LAB — ESTRADIOL: Estradiol: 11.8 pg/mL

## 2024-01-10 ENCOUNTER — Encounter: Payer: Self-pay | Admitting: Obstetrics and Gynecology

## 2024-01-10 DIAGNOSIS — Z79810 Long term (current) use of selective estrogen receptor modulators (SERMs): Secondary | ICD-10-CM

## 2024-01-10 DIAGNOSIS — N950 Postmenopausal bleeding: Secondary | ICD-10-CM

## 2024-01-11 ENCOUNTER — Other Ambulatory Visit (HOSPITAL_COMMUNITY)
Admission: RE | Admit: 2024-01-11 | Discharge: 2024-01-11 | Disposition: A | Discharge status: Home / Self Care | Source: Ambulatory Visit | Attending: Obstetrics and Gynecology | Admitting: Obstetrics and Gynecology

## 2024-01-11 ENCOUNTER — Ambulatory Visit (INDEPENDENT_AMBULATORY_CARE_PROVIDER_SITE_OTHER)

## 2024-01-11 ENCOUNTER — Encounter: Payer: Self-pay | Admitting: Obstetrics and Gynecology

## 2024-01-11 ENCOUNTER — Ambulatory Visit: Admitting: Obstetrics and Gynecology

## 2024-01-11 VITALS — BP 169/90 | HR 80 | Ht 63.0 in | Wt 172.0 lb

## 2024-01-11 DIAGNOSIS — N858 Other specified noninflammatory disorders of uterus: Secondary | ICD-10-CM

## 2024-01-11 DIAGNOSIS — R9389 Abnormal findings on diagnostic imaging of other specified body structures: Secondary | ICD-10-CM

## 2024-01-11 DIAGNOSIS — N950 Postmenopausal bleeding: Secondary | ICD-10-CM | POA: Insufficient documentation

## 2024-01-11 DIAGNOSIS — Z79810 Long term (current) use of selective estrogen receptor modulators (SERMs): Secondary | ICD-10-CM

## 2024-01-11 DIAGNOSIS — Z1151 Encounter for screening for human papillomavirus (HPV): Secondary | ICD-10-CM | POA: Insufficient documentation

## 2024-01-11 DIAGNOSIS — Z124 Encounter for screening for malignant neoplasm of cervix: Secondary | ICD-10-CM | POA: Insufficient documentation

## 2024-01-11 NOTE — Patient Instructions (Signed)
 I value your feedback and you entrusting [place] with your care. If you get a [place] patient survey, I would appreciate you taking the time to let us know about your experience today. Thank you! ? ? ?

## 2024-01-11 NOTE — Progress Notes (Signed)
 Christy Arlyss RAMAN, MD   Chief Complaint  Patient presents with   Endometrial Biopsy    HPI:      [name] is a 53 y.o. [phone] whose LMP was No LMP recorded. Patient is postmenopausal., presents today for PMB on tamoxifen  (no bleeding since Mirena removed 4/22); s/p breast cancer dx 9/21. Pt sent me message yesterday: I am on Tamoxifen  and I am doing the monthly Zoladex  injection. The past 3 months I have had a brownish color discharge and some light bleeding. It lasts a day or two. This month it is a little more of the light bleeding and I am on day 3 [4 now]. No dysmen. Gyn u/s done this AM with thickened EM. Pt here for EMB. Neg pap 4/22.   Patient Active Problem List   Diagnosis Date Noted   Other fatigue 09/10/2023   Nasal congestion 09/10/2023   HLD (hyperlipidemia) 11/27/2022   Pain in left leg 08/18/2021   Localized swelling of left lower leg 08/18/2021   Hordeolum externum of left upper eyelid 07/16/2021   BPV (benign positional vertigo) 06/09/2021   Acquired absence of right breast 09/01/2020   S/P breast reconstruction, right 07/05/2020   H/O mastopexy 07/05/2020   Genetic testing 06/23/2020   Family history of prostate cancer    Family history of breast cancer    Family history of leukemia    Family history of lung cancer    Family history of stomach cancer    Hepatic steatosis 02/13/2020   Malignant neoplasm of lower-outer quadrant of right breast of female, estrogen receptor positive (HCC) 01/29/2020   Diabetes mellitus without complication (HCC) 12/04/2019   Abnormal thyroid  stimulating hormone (TSH) level 09/14/2016   Fever blister 09/14/2016   Advance care planning 09/14/2016   Cough 10/06/2014   Headache 06/05/2012   Shoulder pain 01/18/2012   Anxiety 02/18/2011   Routine general medical examination at a health care facility 09/03/2010   Hypothyroidism 12/14/2006    Past Surgical History:  Procedure Laterality Date   BREAST  RECONSTRUCTION WITH PLACEMENT OF TISSUE EXPANDER AND FLEX HD (ACELLULAR HYDRATED DERMIS) Right 03/30/2020   Procedure: BREAST RECONSTRUCTION WITH PLACEMENT OF TISSUE EXPANDER AND FLEX HD (ACELLULAR HYDRATED DERMIS);  Surgeon: Lowery Estefana RAMAN, DO;  Location: [facility];  Service: Plastics;  Laterality: Right;   BREAST REDUCTION WITH MASTOPEXY Left 05/28/2020   Procedure: BREAST REDUCTION WITH MASTOPEXY;  Surgeon: Lowery Estefana RAMAN, DO;  Location: [facility];  Service: Plastics;  Laterality: Left;   CESAREAN SECTION     X 2 Fetal stress, second scheduled   CHOLECYSTECTOMY  05/1998   LIPOSUCTION WITH LIPOFILLING Right 07/21/2021   Procedure: LIPOSUCTION WITH LIPOFILLING OF RIGHT BREAST;  Surgeon: Lowery Estefana RAMAN, DO;  Location: [facility];  Service: Plastics;  Laterality: Right;   MASTECTOMY MODIFIED RADICAL Right 03/30/2020   Procedure: RIGHT MASTECTOMY MODIFIED RADICAL;  Surgeon: Curvin Deward MOULD, MD;  Location: [facility];  Service: General;  Laterality: Right;  PEC BLOCK, RNFA   REMOVAL OF TISSUE EXPANDER AND PLACEMENT OF IMPLANT Right 05/28/2020   Procedure: REMOVAL OF TISSUE EXPANDER AND PLACEMENT OF IMPLANT;  Surgeon: Lowery Estefana RAMAN, DO;  Location: [facility];  Service: Plastics;  Laterality: Right;  2.5 hours, please    Family History  Problem Relation Age of Onset   Hypertension Father    Depression Father    Prostate cancer Father 46  Prostate, S/P prostatectomy, metastatic   Asthma Sister    Spina bifida Sister    Hypothyroidism Mother    Hypertension Maternal Grandmother    Hyperlipidemia Maternal Grandmother    Lung disease Maternal Grandmother    Lung cancer Maternal Grandmother 35   Hypothyroidism Maternal Grandfather    Prostate cancer Maternal Grandfather    Cancer Maternal Grandfather        lung, stomach, and prostate cancers   Emphysema Paternal Grandmother    Diabetes  Maternal Uncle    Diabetes Maternal Uncle    Leukemia Maternal Uncle 49   Prostate cancer Maternal Uncle        dx in his 60s/70s, metastatic   Breast cancer Other 45       negative genetic testing; maternal first cousin   Stroke Neg Hx    Colon cancer Neg Hx     Social History   Socioeconomic History   Marital status: Divorced    Spouse name: Not on file   Number of children: Not on file   Years of education: Not on file   Highest education level: Some college, no degree  Occupational History   Occupation: Housewife  Tobacco Use   Smoking status: Never   Smokeless tobacco: Never  Vaping Use   Vaping status: Never Used  Substance and Sexual Activity   Alcohol use: Yes    Alcohol/week: 0.0 standard drinks of alcohol    Comment: Very rarely.   Drug use: No   Sexual activity: Yes    Birth control/protection: None  Other Topics Concern   Not on file  Social History Narrative   Married 1997, divorced 2017   In relationship as of 2021   2 kids   Working at [employer].     Social Drivers of Corporate investment banker Strain: Low Risk  (09/06/2023)   Overall Financial Resource Strain (CARDIA)    Difficulty of Paying Living Expenses: Not very hard  Food Insecurity: No Food Insecurity (09/06/2023)   Hunger Vital Sign    Worried About Running Out of Food in the Last Year: Never true    Ran Out of Food in the Last Year: Never true  Transportation Needs: No Transportation Needs (09/06/2023)   PRAPARE - Administrator, Civil Service (Medical): No    Lack of Transportation (Non-Medical): No  Physical Activity: Insufficiently Active (09/06/2023)   Exercise Vital Sign    Days of Exercise per Week: 3 days    Minutes of Exercise per Session: 20 min  Stress: Stress Concern Present (09/06/2023)   [employer] of Occupational Health - Occupational Stress Questionnaire    Feeling of Stress : To some extent  Social Connections: Moderately Isolated (09/06/2023)    Social Connection and Isolation Panel    Frequency of Communication with Friends and Family: More than three times a week    Frequency of Social Gatherings with Friends and Family: Twice a week    Attends Religious Services: 1 to 4 times per year    Active Member of [employer] or Organizations: No    Attends Engineer, structural: Not on file    Marital Status: Divorced  Intimate Partner Violence: Not on file    Outpatient Medications Prior to Visit  Medication Sig Dispense Refill   acyclovir  ointment (ZOVIRAX ) 5 % Apply 1 application topically every 3 (three) hours. As needed. 5 g 5   atorvastatin  (LIPITOR) 10 MG tablet Take 1 tablet (10 mg total)  by mouth daily. 90 tablet 3   augmented betamethasone  dipropionate (DIPROLENE -AF) 0.05 % cream Apply 1 Application topically 2 (two) times daily.     cholecalciferol (VITAMIN D3) 25 MCG (1000 UNIT) tablet Take 1,000 Units by mouth daily.     cyclobenzaprine  (FLEXERIL ) 10 MG tablet TAKE 1/2 TO 1 TABLET BY MOUTH EVERY DAY AS NEEDED 30 tablet 5   Dulaglutide (TRULICITY) 0.75 MG/0.5ML SOPN INJECT 0.75 MG SUBCUTANEOUSLY ONE TIME PER WEEK 6 mL 2   escitalopram  (LEXAPRO ) 10 MG tablet Take 1 tablet (10 mg total) by mouth daily. 90 tablet 3   goserelin (ZOLADEX ) 3.6 MG injection Inject 3.6 mg into the skin every 28 (twenty-eight) days.     ibuprofen  (ADVIL ,MOTRIN ) 200 MG tablet Take 200 mg by mouth every 6 (six) hours as needed.     levothyroxine  (SYNTHROID ) 50 MCG tablet Take 50 mcg by mouth every morning.     metFORMIN  (GLUCOPHAGE ) 500 MG tablet TAKE 1 TABLET (500 MG) DAILY AND THEN INCREASE TO TWICE A DAY AFTER 2 WEEKS IF TOLERATED) 60 tablet 5   tamoxifen  (NOLVADEX ) 20 MG tablet TAKE 1 TABLET BY MOUTH EVERY DAY 30 tablet 5   Turmeric (QC TUMERIC COMPLEX PO) Take by mouth.     valACYclovir  (VALTREX ) 1000 MG tablet TAKE 2 TABLETS (2,000 MG TOTAL) BY MOUTH 2 (TWO) TIMES DAILY AS NEEDED. FOR 1 DAY. 20 tablet 5   fluconazole  (DIFLUCAN ) 150 MG tablet  Take 1 tablet (150 mg total) by mouth once a week. 3 tablet 0   HYDROcodone  bit-homatropine (HYCODAN) 5-1.5 MG/5ML syrup Take 2.5-5 mLs by mouth every 8 (eight) hours as needed for cough (sedation caution). 75 mL 0   No facility-administered medications prior to visit.      ROS:  Review of Systems  Constitutional:  Negative for fever.  Gastrointestinal:  Negative for blood in stool, constipation, diarrhea, nausea and vomiting.  Genitourinary:  Positive for vaginal bleeding. Negative for dyspareunia, dysuria, flank pain, frequency, hematuria, urgency, vaginal discharge and vaginal pain.  Musculoskeletal:  Negative for back pain.  Skin:  Negative for rash.   BREAST: No symptoms   OBJECTIVE:   Vitals:  BP (!) 147/92   Pulse 77   Ht 5' 3 (1.6 m)   Wt 172 lb (78 kg)   BMI 30.47 kg/m   Physical Exam Vitals reviewed.  Constitutional:      Appearance: She is well-developed.  Pulmonary:     Effort: Pulmonary effort is normal.  Genitourinary:    Labia:        Right: No rash, tenderness or lesion.        Left: No rash, tenderness or lesion.      Vagina: Bleeding present. No vaginal discharge or tenderness.     Cervix: No lesion.     Comments: [facility] D/C Musculoskeletal:        General: Normal range of motion.     Cervical back: Normal range of motion.  Skin:    General: Skin is warm and dry.  Neurological:     General: No focal deficit present.     Mental Status: She is alert and oriented to person, place, and time.     Cranial Nerves: No cranial nerve deficit.  Psychiatric:        Mood and Affect: Mood normal.        Behavior: Behavior normal.        Thought Content: Thought content normal.  Judgment: Judgment normal.     GYN ULTRASOUND REPORT    Patient Name: AOKI WEDEMEYER DOB: 1970-11-02 MRN: [id number] LMP:      Location: [facility] Date of Service: 01/11/2024 Ordering Provider: Bernarda Schroeder, [facility]    Indications:PMB/Pt. On tamoxifen . Findings:  The uterus is anteverted and measures 8.5 x 4.5 x 5.2 cm with a uterine volume of 104.37 ml. Echo texture is heterogenous without evidence of focal masses.   The Endometrium measures 17.8 mm. The endometrium appears thickened with multiple Hypoechoic areas seen within. This is very concerning for possible malignancy and should be biopsied.   Right Ovary measures 2.4 x 1.6 x 1.9 cm. It is normal in appearance. Left Ovary measures 2.5 x 1.7 x 1.9 cm. It is not normal in appearance.There is a small cystic Areas seen and measured: 1.5 x 1.4 x 1.5 cm. Survey of the adnexa demonstrates no adnexal masses. There is no free fluid in the cul de sac.   Impression: 1. Thickened endometrium with multiple hypoechoic areas seen within. 2. Small left ovarian cyst seen.   Recommendations: 1.Clinical correlation with the patient's History and Physical Exam. 2. Patient to have an endometrial biopsy.   Berwyn DELENA Hummer, RDMS (AB,OB,BR),RVT  Endometrial Biopsy After discussion with the patient regarding her abnormal uterine bleeding I recommended that she proceed with an endometrial biopsy for further diagnosis. The risks, benefits, alternatives, and indications for an endometrial biopsy were discussed with the patient in detail. She understood the risks including infection, bleeding, cervical laceration and uterine perforation.  Verbal consent was obtained.   PROCEDURE NOTE:  Pipelle endometrial biopsy was performed using aseptic technique with iodine preparation.  The uterus was sounded to a length of 9.0 cm.  Adequate sampling was obtained with minimal blood loss.  The patient tolerated the procedure well.  Disposition will be pending pathology.  Assessment/Plan: PMB (postmenopausal bleeding) - Plan: Cytology - PAP, Surgical pathology; for 3 months, on tamoxifen  with thickened EM. EMB done today, will f/u with results/check pap. If neg, most likely still  recommend D&C given sx but will discuss with MD.   Thickened endometrium - Plan: Surgical pathology  Use of tamoxifen  (Nolvadex ) - Plan: Surgical pathology  Cervical cancer screening - Plan: Cytology - PAP  Screening for HPV (human papillomavirus) - Plan: Cytology - PAP   Return if symptoms worsen or fail to improve.  Tytianna Greenley B. Keyasha Miah, PA-C 01/11/2024 4:55 PM

## 2024-01-16 ENCOUNTER — Ambulatory Visit: Payer: Self-pay | Admitting: Obstetrics and Gynecology

## 2024-01-16 LAB — CYTOLOGY - PAP
Comment: NEGATIVE
Diagnosis: NEGATIVE
High risk HPV: NEGATIVE

## 2024-01-16 LAB — SURGICAL PATHOLOGY

## 2024-01-26 ENCOUNTER — Inpatient Hospital Stay: Attending: Hematology and Oncology

## 2024-01-26 ENCOUNTER — Ambulatory Visit: Admitting: Hematology and Oncology

## 2024-01-26 VITALS — BP 146/79 | HR 65 | Resp 16

## 2024-01-26 DIAGNOSIS — Z806 Family history of leukemia: Secondary | ICD-10-CM | POA: Insufficient documentation

## 2024-01-26 DIAGNOSIS — Z170 Estrogen receptor positive status [ER+]: Secondary | ICD-10-CM | POA: Insufficient documentation

## 2024-01-26 DIAGNOSIS — R5383 Other fatigue: Secondary | ICD-10-CM | POA: Diagnosis not present

## 2024-01-26 DIAGNOSIS — Z803 Family history of malignant neoplasm of breast: Secondary | ICD-10-CM | POA: Diagnosis not present

## 2024-01-26 DIAGNOSIS — Z801 Family history of malignant neoplasm of trachea, bronchus and lung: Secondary | ICD-10-CM | POA: Insufficient documentation

## 2024-01-26 DIAGNOSIS — C50511 Malignant neoplasm of lower-outer quadrant of right female breast: Secondary | ICD-10-CM | POA: Diagnosis not present

## 2024-01-26 DIAGNOSIS — Z7985 Long-term (current) use of injectable non-insulin antidiabetic drugs: Secondary | ICD-10-CM | POA: Insufficient documentation

## 2024-01-26 DIAGNOSIS — Z79810 Long term (current) use of selective estrogen receptor modulators (SERMs): Secondary | ICD-10-CM | POA: Insufficient documentation

## 2024-01-26 DIAGNOSIS — Z923 Personal history of irradiation: Secondary | ICD-10-CM | POA: Diagnosis not present

## 2024-01-26 DIAGNOSIS — E119 Type 2 diabetes mellitus without complications: Secondary | ICD-10-CM | POA: Insufficient documentation

## 2024-01-26 DIAGNOSIS — Z9011 Acquired absence of right breast and nipple: Secondary | ICD-10-CM | POA: Insufficient documentation

## 2024-01-26 DIAGNOSIS — Z800 Family history of malignant neoplasm of digestive organs: Secondary | ICD-10-CM | POA: Insufficient documentation

## 2024-01-26 DIAGNOSIS — R232 Flushing: Secondary | ICD-10-CM | POA: Insufficient documentation

## 2024-01-26 MED ORDER — GOSERELIN ACETATE 3.6 MG ~~LOC~~ IMPL
3.6000 mg | DRUG_IMPLANT | Freq: Once | SUBCUTANEOUS | Status: AC
  Administered 2024-01-26: 3.6 mg via SUBCUTANEOUS
  Filled 2024-01-26: qty 3.6

## 2024-01-29 ENCOUNTER — Other Ambulatory Visit: Payer: Self-pay | Admitting: Family Medicine

## 2024-02-08 ENCOUNTER — Encounter: Payer: Self-pay | Admitting: Hematology and Oncology

## 2024-02-21 ENCOUNTER — Other Ambulatory Visit: Payer: Self-pay | Admitting: Family Medicine

## 2024-02-23 ENCOUNTER — Inpatient Hospital Stay: Attending: Hematology and Oncology

## 2024-02-23 VITALS — BP 157/81 | HR 84 | Temp 97.9°F | Resp 16

## 2024-02-23 DIAGNOSIS — R232 Flushing: Secondary | ICD-10-CM | POA: Diagnosis not present

## 2024-02-23 DIAGNOSIS — Z923 Personal history of irradiation: Secondary | ICD-10-CM | POA: Insufficient documentation

## 2024-02-23 DIAGNOSIS — Z801 Family history of malignant neoplasm of trachea, bronchus and lung: Secondary | ICD-10-CM | POA: Insufficient documentation

## 2024-02-23 DIAGNOSIS — C50511 Malignant neoplasm of lower-outer quadrant of right female breast: Secondary | ICD-10-CM | POA: Diagnosis present

## 2024-02-23 DIAGNOSIS — Z806 Family history of leukemia: Secondary | ICD-10-CM | POA: Diagnosis not present

## 2024-02-23 DIAGNOSIS — Z803 Family history of malignant neoplasm of breast: Secondary | ICD-10-CM | POA: Diagnosis not present

## 2024-02-23 DIAGNOSIS — R5383 Other fatigue: Secondary | ICD-10-CM | POA: Insufficient documentation

## 2024-02-23 DIAGNOSIS — Z9011 Acquired absence of right breast and nipple: Secondary | ICD-10-CM | POA: Diagnosis not present

## 2024-02-23 DIAGNOSIS — Z800 Family history of malignant neoplasm of digestive organs: Secondary | ICD-10-CM | POA: Diagnosis not present

## 2024-02-23 DIAGNOSIS — Z170 Estrogen receptor positive status [ER+]: Secondary | ICD-10-CM | POA: Diagnosis not present

## 2024-02-23 DIAGNOSIS — E119 Type 2 diabetes mellitus without complications: Secondary | ICD-10-CM | POA: Diagnosis not present

## 2024-02-23 DIAGNOSIS — Z79810 Long term (current) use of selective estrogen receptor modulators (SERMs): Secondary | ICD-10-CM | POA: Diagnosis not present

## 2024-02-23 DIAGNOSIS — Z7985 Long-term (current) use of injectable non-insulin antidiabetic drugs: Secondary | ICD-10-CM | POA: Insufficient documentation

## 2024-02-23 MED ORDER — GOSERELIN ACETATE 3.6 MG ~~LOC~~ IMPL
3.6000 mg | DRUG_IMPLANT | Freq: Once | SUBCUTANEOUS | Status: AC
  Administered 2024-02-23: 3.6 mg via SUBCUTANEOUS
  Filled 2024-02-23: qty 3.6

## 2024-03-04 NOTE — Progress Notes (Signed)
 Pls call pt to see if she wants to scehdule hyst/D&C consult with Dr. Leigh or Starla due to AUB on tamoxifen . I have left her messages. Thx

## 2024-03-13 ENCOUNTER — Other Ambulatory Visit: Payer: Self-pay | Admitting: Family Medicine

## 2024-03-13 DIAGNOSIS — F419 Anxiety disorder, unspecified: Secondary | ICD-10-CM

## 2024-03-13 DIAGNOSIS — E785 Hyperlipidemia, unspecified: Secondary | ICD-10-CM

## 2024-03-19 ENCOUNTER — Telehealth: Payer: Self-pay | Admitting: Hematology and Oncology

## 2024-03-19 NOTE — Telephone Encounter (Signed)
 Patient called in wanting to reschedule her injection appointment from 11/7 to 11/6. Time and date has been changed and patient aware.

## 2024-03-21 ENCOUNTER — Inpatient Hospital Stay: Attending: Hematology and Oncology

## 2024-03-21 VITALS — BP 146/84 | HR 84 | Temp 98.9°F | Resp 18

## 2024-03-21 DIAGNOSIS — R5383 Other fatigue: Secondary | ICD-10-CM | POA: Diagnosis not present

## 2024-03-21 DIAGNOSIS — Z170 Estrogen receptor positive status [ER+]: Secondary | ICD-10-CM | POA: Insufficient documentation

## 2024-03-21 DIAGNOSIS — Z79810 Long term (current) use of selective estrogen receptor modulators (SERMs): Secondary | ICD-10-CM | POA: Insufficient documentation

## 2024-03-21 DIAGNOSIS — Z923 Personal history of irradiation: Secondary | ICD-10-CM | POA: Insufficient documentation

## 2024-03-21 DIAGNOSIS — Z801 Family history of malignant neoplasm of trachea, bronchus and lung: Secondary | ICD-10-CM | POA: Diagnosis not present

## 2024-03-21 DIAGNOSIS — Z800 Family history of malignant neoplasm of digestive organs: Secondary | ICD-10-CM | POA: Insufficient documentation

## 2024-03-21 DIAGNOSIS — E119 Type 2 diabetes mellitus without complications: Secondary | ICD-10-CM | POA: Insufficient documentation

## 2024-03-21 DIAGNOSIS — Z9011 Acquired absence of right breast and nipple: Secondary | ICD-10-CM | POA: Diagnosis not present

## 2024-03-21 DIAGNOSIS — Z806 Family history of leukemia: Secondary | ICD-10-CM | POA: Insufficient documentation

## 2024-03-21 DIAGNOSIS — Z803 Family history of malignant neoplasm of breast: Secondary | ICD-10-CM | POA: Insufficient documentation

## 2024-03-21 DIAGNOSIS — Z7985 Long-term (current) use of injectable non-insulin antidiabetic drugs: Secondary | ICD-10-CM | POA: Insufficient documentation

## 2024-03-21 DIAGNOSIS — C50511 Malignant neoplasm of lower-outer quadrant of right female breast: Secondary | ICD-10-CM | POA: Diagnosis not present

## 2024-03-21 DIAGNOSIS — R232 Flushing: Secondary | ICD-10-CM | POA: Diagnosis not present

## 2024-03-21 MED ORDER — GOSERELIN ACETATE 3.6 MG ~~LOC~~ IMPL
3.6000 mg | DRUG_IMPLANT | Freq: Once | SUBCUTANEOUS | Status: AC
  Administered 2024-03-21: 3.6 mg via SUBCUTANEOUS
  Filled 2024-03-21: qty 3.6

## 2024-03-22 ENCOUNTER — Encounter: Payer: Self-pay | Admitting: Family Medicine

## 2024-03-22 ENCOUNTER — Ambulatory Visit (INDEPENDENT_AMBULATORY_CARE_PROVIDER_SITE_OTHER): Admitting: Family Medicine

## 2024-03-22 ENCOUNTER — Inpatient Hospital Stay

## 2024-03-22 VITALS — BP 136/74 | HR 85 | Resp 18 | Ht 63.0 in | Wt 173.6 lb

## 2024-03-22 DIAGNOSIS — Z170 Estrogen receptor positive status [ER+]: Secondary | ICD-10-CM

## 2024-03-22 DIAGNOSIS — Z0000 Encounter for general adult medical examination without abnormal findings: Secondary | ICD-10-CM

## 2024-03-22 DIAGNOSIS — E785 Hyperlipidemia, unspecified: Secondary | ICD-10-CM

## 2024-03-22 DIAGNOSIS — Z7189 Other specified counseling: Secondary | ICD-10-CM

## 2024-03-22 DIAGNOSIS — R809 Proteinuria, unspecified: Secondary | ICD-10-CM | POA: Diagnosis not present

## 2024-03-22 DIAGNOSIS — E1129 Type 2 diabetes mellitus with other diabetic kidney complication: Secondary | ICD-10-CM

## 2024-03-22 DIAGNOSIS — Z1211 Encounter for screening for malignant neoplasm of colon: Secondary | ICD-10-CM

## 2024-03-22 DIAGNOSIS — E039 Hypothyroidism, unspecified: Secondary | ICD-10-CM

## 2024-03-22 DIAGNOSIS — E119 Type 2 diabetes mellitus without complications: Secondary | ICD-10-CM

## 2024-03-22 DIAGNOSIS — C50511 Malignant neoplasm of lower-outer quadrant of right female breast: Secondary | ICD-10-CM | POA: Diagnosis not present

## 2024-03-22 DIAGNOSIS — F419 Anxiety disorder, unspecified: Secondary | ICD-10-CM

## 2024-03-22 MED ORDER — METFORMIN HCL 500 MG PO TABS: 500.0000 mg | ORAL_TABLET | Freq: Every day | ORAL | Status: AC

## 2024-03-22 MED ORDER — LEVOTHYROXINE SODIUM 50 MCG PO TABS: 50.0000 ug | ORAL_TABLET | Freq: Every morning | ORAL | 3 refills | Status: DC

## 2024-03-22 MED ORDER — ESCITALOPRAM OXALATE 10 MG PO TABS: 10.0000 mg | ORAL_TABLET | Freq: Every day | ORAL | 3 refills | Status: AC

## 2024-03-22 MED ORDER — LEVOTHYROXINE SODIUM 75 MCG PO TABS: 75.0000 ug | ORAL_TABLET | Freq: Every morning | ORAL | 3 refills | Status: AC

## 2024-03-22 MED ORDER — METFORMIN HCL 500 MG PO TABS: ORAL_TABLET | ORAL | Status: DC

## 2024-03-22 MED ORDER — ATORVASTATIN CALCIUM 10 MG PO TABS: 10.0000 mg | ORAL_TABLET | Freq: Every day | ORAL | 3 refills | Status: AC

## 2024-03-22 NOTE — Progress Notes (Signed)
 CPE- See plan.  Routine anticipatory guidance given to patient.  See health maintenance.  The possibility exists that previously documented standard health maintenance information may have been brought forward from a previous encounter into this note.  If needed, that same information has been updated to reflect the current situation based on today's encounter.    Tetanus 2022 Flu to be done at pharmacy.   PNA encouraged.  shingles d/w pt.  covid vaccine prev d/w pt.  Diet and exercise d/w pt.  encouraged both.   Cologuard neg 2022, pending 2025.   Pap per gyn. I'll defer.  [facility].  DXA not due.  Mammogram pending 2025 Living will d/w pt.  Would have her sons Roberts and Dorsey equally designated if patient were incapacitated.  If needed, her mother would designated next if they were unavailable.    Hypothyroidism.  TSH pending.  Compliant, early AM dosing.  D/w pt about using.  No neck mass, no dysphagia.    Mood d/w pt.  Mult stressors d/w pt.  No SI/HI.  Lexapro  helped.  Sleeping well, once she gets to sleep.    Diabetes:  Using medications without difficulties: yes, with max tolerated dose of 500mg   per day of metformin .   Taking trulicity weekly 0.75mg .   Hypoglycemic episodes: no sx Hyperglycemic episodes: no sx Feet problems: no Blood Sugars averaging: not checked.  eye exam within last year: due, d/w pt.    Elevated Cholesterol: Using medications without problems: yes Muscle aches: no  Diet compliance: d/w pt.  Exercise: d/w pt.  Nonfasting, labs pending.   H/o hot flashes, less severe than prior, followed by outside clinic for h/o breast cancer.    She saw pulmonary re: OSA.  She is going to check with pulmonary re: follow up.    Her son got married.  Her mother and step father both had surgery this year.  D/w pt.    PMH and SH reviewed  Meds, vitals, and allergies reviewed.   ROS: Per HPI.  Unless specifically indicated otherwise in HPI, the patient  denies:  General: fever. Eyes: acute vision changes ENT: sore throat Cardiovascular: chest pain Respiratory: SOB GI: vomiting GU: dysuria Musculoskeletal: acute back pain Derm: acute rash Neuro: acute motor dysfunction Psych: worsening mood Endocrine: polydipsia Heme: bleeding Allergy: hayfever  GEN: nad, alert and oriented HEENT: mucous membranes moist NECK: supple w/o LA CV: rrr. PULM: ctab, no inc wob ABD: soft, +bs EXT: no edema SKIN: well perfused.   Diabetic foot exam: Normal inspection No skin breakdown No calluses  Normal DP pulses Normal sensation to light touch and monofilament Nails normal

## 2024-03-22 NOTE — Patient Instructions (Addendum)
 Go to the lab on the way out.   If you have mychart we'll likely use that to update you.    Take care.  Glad to see you. Eye exam when possible.   Flu shot when possible.    If you are not taking 75mcg levothyroxine , let me know.

## 2024-03-23 LAB — MICROALBUMIN / CREATININE URINE RATIO
Creatinine, Urine: 276 mg/dL — ABNORMAL HIGH (ref 20–275)
Microalb Creat Ratio: 20 mg/g{creat} (ref <30)
Microalb, Ur: 5.5 mg/dL

## 2024-03-23 LAB — LIPID PANEL
Cholesterol: 127 mg/dL (ref <200)
HDL: 42 mg/dL — ABNORMAL LOW (ref >=50)
LDL Cholesterol (Calc): 50 mg/dL
Non-HDL Cholesterol (Calc): 85 mg/dL (ref <130)
Total CHOL/HDL Ratio: 3 (calc) (ref <5.0)
Triglycerides: 332 mg/dL — ABNORMAL HIGH (ref <150)

## 2024-03-23 LAB — HEMOGLOBIN A1C
Hgb A1c MFr Bld: 7.3 % — ABNORMAL HIGH (ref <5.7)
Mean Plasma Glucose: 163 mg/dL
eAG (mmol/L): 9 mmol/L

## 2024-03-23 LAB — TSH: TSH: 3.54 m[IU]/L

## 2024-03-24 ENCOUNTER — Ambulatory Visit: Payer: Self-pay | Admitting: Family Medicine

## 2024-03-24 NOTE — Assessment & Plan Note (Signed)
Living will d/w pt.  Would have her sons Aileen Pilot and Verlin Fester equally designated if patient were incapacitated.  If needed, her mother would designated next if they were unavailable.

## 2024-03-24 NOTE — Assessment & Plan Note (Signed)
 Mult stressors d/w pt.  No SI/HI.  Lexapro  helped.  Sleeping well, once she gets to sleep.   Would continue Lexapro .

## 2024-03-24 NOTE — Assessment & Plan Note (Signed)
 History of, per outside clinic.

## 2024-03-24 NOTE — Assessment & Plan Note (Signed)
 Tetanus 2022 Flu to be done at pharmacy.   PNA encouraged.  shingles d/w pt.  covid vaccine prev d/w pt.  Diet and exercise d/w pt.  encouraged both.   Cologuard neg 2022, pending 2025.   Pap per gyn. I'll defer.  [facility].  DXA not due.  Mammogram pending 2025 Living will d/w pt.  Would have her sons Roberts and Dorsey equally designated if patient were incapacitated.  If needed, her mother would designated next if they were unavailable.

## 2024-03-24 NOTE — Assessment & Plan Note (Signed)
Continue atorvastatin.  See notes on labs. 

## 2024-03-24 NOTE — Assessment & Plan Note (Signed)
 TSH pending.  Continue levothyroxine  as is, 75 mcg daily.

## 2024-03-24 NOTE — Assessment & Plan Note (Signed)
 Continue work on diet and exercise.  See notes on labs.  Continue metformin  and Trulicity.  Recheck periodically.

## 2024-04-10 DIAGNOSIS — D1801 Hemangioma of skin and subcutaneous tissue: Secondary | ICD-10-CM | POA: Diagnosis not present

## 2024-04-10 DIAGNOSIS — L400 Psoriasis vulgaris: Secondary | ICD-10-CM | POA: Diagnosis not present

## 2024-04-10 DIAGNOSIS — D225 Melanocytic nevi of trunk: Secondary | ICD-10-CM | POA: Diagnosis not present

## 2024-04-10 DIAGNOSIS — L720 Epidermal cyst: Secondary | ICD-10-CM | POA: Diagnosis not present

## 2024-04-10 DIAGNOSIS — L821 Other seborrheic keratosis: Secondary | ICD-10-CM | POA: Diagnosis not present

## 2024-04-10 DIAGNOSIS — D2262 Melanocytic nevi of left upper limb, including shoulder: Secondary | ICD-10-CM | POA: Diagnosis not present

## 2024-04-10 DIAGNOSIS — D2261 Melanocytic nevi of right upper limb, including shoulder: Secondary | ICD-10-CM | POA: Diagnosis not present

## 2024-04-10 DIAGNOSIS — L814 Other melanin hyperpigmentation: Secondary | ICD-10-CM | POA: Diagnosis not present

## 2024-04-19 ENCOUNTER — Inpatient Hospital Stay: Attending: Hematology and Oncology

## 2024-04-19 ENCOUNTER — Ambulatory Visit

## 2024-04-19 VITALS — BP 152/86 | HR 87 | Temp 98.4°F | Resp 18

## 2024-04-19 DIAGNOSIS — Z803 Family history of malignant neoplasm of breast: Secondary | ICD-10-CM | POA: Insufficient documentation

## 2024-04-19 DIAGNOSIS — Z9011 Acquired absence of right breast and nipple: Secondary | ICD-10-CM | POA: Diagnosis not present

## 2024-04-19 DIAGNOSIS — Z79810 Long term (current) use of selective estrogen receptor modulators (SERMs): Secondary | ICD-10-CM | POA: Insufficient documentation

## 2024-04-19 DIAGNOSIS — E119 Type 2 diabetes mellitus without complications: Secondary | ICD-10-CM | POA: Insufficient documentation

## 2024-04-19 DIAGNOSIS — Z923 Personal history of irradiation: Secondary | ICD-10-CM | POA: Insufficient documentation

## 2024-04-19 DIAGNOSIS — Z800 Family history of malignant neoplasm of digestive organs: Secondary | ICD-10-CM | POA: Insufficient documentation

## 2024-04-19 DIAGNOSIS — R5383 Other fatigue: Secondary | ICD-10-CM | POA: Diagnosis not present

## 2024-04-19 DIAGNOSIS — Z7985 Long-term (current) use of injectable non-insulin antidiabetic drugs: Secondary | ICD-10-CM | POA: Insufficient documentation

## 2024-04-19 DIAGNOSIS — Z806 Family history of leukemia: Secondary | ICD-10-CM | POA: Diagnosis not present

## 2024-04-19 DIAGNOSIS — C50511 Malignant neoplasm of lower-outer quadrant of right female breast: Secondary | ICD-10-CM | POA: Insufficient documentation

## 2024-04-19 DIAGNOSIS — Z170 Estrogen receptor positive status [ER+]: Secondary | ICD-10-CM | POA: Diagnosis present

## 2024-04-19 DIAGNOSIS — R232 Flushing: Secondary | ICD-10-CM | POA: Insufficient documentation

## 2024-04-19 DIAGNOSIS — Z801 Family history of malignant neoplasm of trachea, bronchus and lung: Secondary | ICD-10-CM | POA: Insufficient documentation

## 2024-04-19 MED ORDER — GOSERELIN ACETATE 3.6 MG ~~LOC~~ IMPL
3.6000 mg | DRUG_IMPLANT | Freq: Once | SUBCUTANEOUS | Status: AC
  Administered 2024-04-19: 3.6 mg via SUBCUTANEOUS

## 2024-04-22 ENCOUNTER — Ambulatory Visit
Admission: RE | Admit: 2024-04-22 | Discharge: 2024-04-22 | Disposition: A | Source: Ambulatory Visit | Attending: Hematology and Oncology

## 2024-04-22 ENCOUNTER — Ambulatory Visit

## 2024-04-22 DIAGNOSIS — Z1231 Encounter for screening mammogram for malignant neoplasm of breast: Secondary | ICD-10-CM | POA: Diagnosis not present

## 2024-04-22 DIAGNOSIS — C50511 Malignant neoplasm of lower-outer quadrant of right female breast: Secondary | ICD-10-CM

## 2024-04-25 NOTE — Progress Notes (Unsigned)
° ° °  GYNECOLOGY PROGRESS NOTE  Subjective:  PCP: Cleatus Arlyss RAMAN, MD  Patient ID: Christy Ferrell, female    DOB: [birth year], 53 y.o.   MRN: [id number]  HPI  Patient is a 53 y.o. [id number] female who presents for consult for a thickened endometrium on Tamoxifen .  Referred by Christy Perfect PA-C.  Patient has been experiencing vaginal bleeding since August for which an ultrasound and EMB were performed.  EMT 17.53mm, EMB negative for hyperplasia or malignancy.  Neg pap 4/22.  Patient reports she continues to have light bleeding, sometimes with clots - at least once a month, twice this month, irregularly since August.  Bleeding is not cyclic.   The following portions of the patient's history were reviewed and updated as appropriate: allergies, current medications, past family history, past medical history, past social history, past surgical history, and problem list.  Review of Systems Pertinent items are noted in HPI.   Objective:   Blood pressure (!) 143/65, pulse 62, resp. rate 15, height 5' 3 (1.6 m), weight 175 lb 1.6 oz (79.4 kg). Body mass index is 31.02 kg/m.  General appearance: alert and cooperative Abdomen: soft, non-tender; bowel sounds normal; no masses,  no organomegaly Pelvic: deferred Extremities: extremities normal, atraumatic, no cyanosis or edema Neurologic: Grossly normal  Ultrasound 01/11/24 Location: [facility] Date of Service: 01/11/2024 Ordering Provider: Bernarda Schroeder, [facility]   Indications:PMB/Pt. On tamoxifen . Findings:  The uterus is anteverted and measures 8.5 x 4.5 x 5.2 cm with a uterine volume of 104.37 ml. Echo texture is heterogenous without evidence of focal masses.   The Endometrium measures 17.8 mm. The endometrium appears thickened with multiple Hypoechoic areas seen within. This is very concerning for possible malignancy and should be biopsied.   Right Ovary measures 2.4 x 1.6 x 1.9 cm. It is normal in appearance. Left Ovary  measures 2.5 x 1.7 x 1.9 cm. It is not normal in appearance.There is a small cystic Areas seen and measured: 1.5 x 1.4 x 1.5 cm. Survey of the adnexa demonstrates no adnexal masses. There is no free fluid in the cul de sac.   Impression: 1. Thickened endometrium with multiple hypoechoic areas seen within. 2. Small left ovarian cyst seen.   Recommendations: 1.Clinical correlation with the patient's History and Physical Exam. 2. Patient to have an endometrial biopsy.   Berwyn DELENA Hummer, RDMS (AB,OB,BR),RVT   Clinical Impression and recommendations:   I have reviewed the sonogram results above, combined with the patient's current clinical course, below are my impressions and any appropriate recommendations for management based on the sonographic findings.   Normal uterine size and shape Thickened endometrium with multiple hypoechoic areas noted- abnormal finding. Normal right ovary.  Left ovary with unilocular anechoic cyst measuring  1.5 x 1.4 x 1.5 cm- physiologic finding.     -Based on the US  findings, O-RADS Cat 1 - physiologic finding.  (O-RADS US  Risk Stratification and Management System: A Consensus Guidelines from the ACR Ovarian-Adnexal Reporting and Data System, Radiology 2020; [phone])   -In the setting of thickened endometrium and postmenopausal bleeding, recommend endometrial sampling.  EMB Pathology 01/11/24 FINAL MICROSCOPIC DIAGNOSIS:  A. ENDOMETRIUM, BIOPSY:  - Scant benign inactive endometrium  - Negative for hyperplasia or malignancy   Assessment/Plan:   1. Thickened endometrium   2. Use of tamoxifen  (Nolvadex )     Watchful waiting  There are no diagnoses linked to this encounter.     Estil Mangle, DO [facility]

## 2024-04-30 ENCOUNTER — Encounter: Payer: Self-pay | Admitting: Obstetrics

## 2024-04-30 ENCOUNTER — Ambulatory Visit: Admitting: Obstetrics

## 2024-04-30 VITALS — BP 143/65 | HR 62 | Resp 15 | Ht 63.0 in | Wt 175.1 lb

## 2024-04-30 DIAGNOSIS — R9389 Abnormal findings on diagnostic imaging of other specified body structures: Secondary | ICD-10-CM | POA: Insufficient documentation

## 2024-04-30 DIAGNOSIS — Z79810 Long term (current) use of selective estrogen receptor modulators (SERMs): Secondary | ICD-10-CM | POA: Insufficient documentation

## 2024-04-30 DIAGNOSIS — N950 Postmenopausal bleeding: Secondary | ICD-10-CM | POA: Diagnosis not present

## 2024-05-01 ENCOUNTER — Encounter: Payer: Self-pay | Admitting: Obstetrics

## 2024-05-02 DIAGNOSIS — Z1211 Encounter for screening for malignant neoplasm of colon: Secondary | ICD-10-CM | POA: Diagnosis not present

## 2024-05-08 ENCOUNTER — Other Ambulatory Visit: Payer: Self-pay | Admitting: Family Medicine

## 2024-05-08 DIAGNOSIS — R809 Proteinuria, unspecified: Secondary | ICD-10-CM

## 2024-05-08 LAB — COLOGUARD: COLOGUARD: NEGATIVE

## 2024-05-13 NOTE — Telephone Encounter (Signed)
 Trulicity Last filled:  04/14/24, #2 mL Last OV:  03/22/24, CPE Next OV:  none

## 2024-05-17 ENCOUNTER — Encounter: Payer: Self-pay | Admitting: Hematology and Oncology

## 2024-05-17 ENCOUNTER — Inpatient Hospital Stay: Attending: Hematology and Oncology

## 2024-05-17 VITALS — BP 155/92 | HR 80 | Temp 98.9°F | Resp 14

## 2024-05-17 DIAGNOSIS — Z806 Family history of leukemia: Secondary | ICD-10-CM | POA: Diagnosis not present

## 2024-05-17 DIAGNOSIS — E119 Type 2 diabetes mellitus without complications: Secondary | ICD-10-CM | POA: Insufficient documentation

## 2024-05-17 DIAGNOSIS — Z7985 Long-term (current) use of injectable non-insulin antidiabetic drugs: Secondary | ICD-10-CM | POA: Diagnosis not present

## 2024-05-17 DIAGNOSIS — Z9011 Acquired absence of right breast and nipple: Secondary | ICD-10-CM | POA: Insufficient documentation

## 2024-05-17 DIAGNOSIS — Z800 Family history of malignant neoplasm of digestive organs: Secondary | ICD-10-CM | POA: Diagnosis not present

## 2024-05-17 DIAGNOSIS — Z923 Personal history of irradiation: Secondary | ICD-10-CM | POA: Insufficient documentation

## 2024-05-17 DIAGNOSIS — Z803 Family history of malignant neoplasm of breast: Secondary | ICD-10-CM | POA: Insufficient documentation

## 2024-05-17 DIAGNOSIS — R232 Flushing: Secondary | ICD-10-CM | POA: Insufficient documentation

## 2024-05-17 DIAGNOSIS — Z79810 Long term (current) use of selective estrogen receptor modulators (SERMs): Secondary | ICD-10-CM | POA: Diagnosis not present

## 2024-05-17 DIAGNOSIS — Z801 Family history of malignant neoplasm of trachea, bronchus and lung: Secondary | ICD-10-CM | POA: Diagnosis not present

## 2024-05-17 DIAGNOSIS — C50511 Malignant neoplasm of lower-outer quadrant of right female breast: Secondary | ICD-10-CM | POA: Insufficient documentation

## 2024-05-17 DIAGNOSIS — R5383 Other fatigue: Secondary | ICD-10-CM | POA: Diagnosis not present

## 2024-05-17 DIAGNOSIS — Z170 Estrogen receptor positive status [ER+]: Secondary | ICD-10-CM | POA: Insufficient documentation

## 2024-05-17 MED ORDER — GOSERELIN ACETATE 3.6 MG ~~LOC~~ IMPL
3.6000 mg | DRUG_IMPLANT | Freq: Once | SUBCUTANEOUS | Status: AC
  Administered 2024-05-17: 3.6 mg via SUBCUTANEOUS
  Filled 2024-05-17: qty 3.6

## 2024-05-20 ENCOUNTER — Encounter: Payer: Self-pay | Admitting: Hematology and Oncology

## 2024-05-20 ENCOUNTER — Other Ambulatory Visit (HOSPITAL_COMMUNITY): Payer: Self-pay

## 2024-05-20 ENCOUNTER — Telehealth: Payer: Self-pay

## 2024-05-20 NOTE — Telephone Encounter (Signed)
 Pharmacy Patient Advocate Encounter   Received notification from [employer] that prior authorization for Trulicity 0.75 is required/requested.   Insurance verification completed.   The patient is insured through [facility].   PA request faxed to [facility] via form [phone]

## 2024-05-27 ENCOUNTER — Encounter: Payer: Self-pay | Admitting: Hematology and Oncology

## 2024-05-28 ENCOUNTER — Encounter: Payer: Self-pay | Admitting: Hematology and Oncology

## 2024-05-28 ENCOUNTER — Other Ambulatory Visit (HOSPITAL_COMMUNITY): Payer: Self-pay

## 2024-05-28 NOTE — Telephone Encounter (Signed)
 Pharmacy Patient Advocate Encounter  Received notification from [facility] that Prior Authorization for Trulicity 0.75 has been APPROVED from 05/22/24 to 05/22/25.

## 2024-05-30 NOTE — Telephone Encounter (Signed)
 Mychart message sent to patient.

## 2024-05-31 ENCOUNTER — Encounter: Payer: Self-pay | Admitting: Family Medicine

## 2024-05-31 ENCOUNTER — Ambulatory Visit: Admitting: Family Medicine

## 2024-05-31 VITALS — BP 142/92 | HR 70 | Temp 98.8°F | Ht 63.0 in | Wt 171.5 lb

## 2024-05-31 DIAGNOSIS — R040 Epistaxis: Secondary | ICD-10-CM | POA: Diagnosis not present

## 2024-05-31 LAB — CBC WITH DIFFERENTIAL/PLATELET
Absolute Lymphocytes: 5006 {cells}/uL — ABNORMAL HIGH (ref 850–3900)
Absolute Monocytes: 728 {cells}/uL (ref 200–950)
Basophils Absolute: 45 {cells}/uL (ref 0–200)
Basophils Relative: 0.4 %
Eosinophils Absolute: 134 {cells}/uL (ref 15–500)
Eosinophils Relative: 1.2 %
HCT: 40.8 % (ref 35.9–46.0)
Hemoglobin: 13.4 g/dL (ref 11.7–15.5)
MCH: 29.4 pg (ref 27.0–33.0)
MCHC: 32.8 g/dL (ref 31.6–35.4)
MCV: 89.5 fL (ref 81.4–101.7)
MPV: 9.7 fL (ref 7.5–12.5)
Monocytes Relative: 6.5 %
Neutro Abs: 5286 {cells}/uL (ref 1500–7800)
Neutrophils Relative %: 47.2 %
Platelets: 340 Thousand/uL (ref 140–400)
RBC: 4.56 Million/uL (ref 3.80–5.10)
RDW: 12.9 % (ref 11.0–15.0)
Total Lymphocyte: 44.7 %
WBC: 11.2 Thousand/uL — ABNORMAL HIGH (ref 3.8–10.8)

## 2024-05-31 NOTE — Progress Notes (Unsigned)
 She had higher BP readings recently.   Recheck BP today.  I asked her to keep an eye on her blood pressure, increased water intake and be vigilant about decreasing salt intake.  She can let me know if her blood pressure remains elevated.  Epistaxis.  She had some episodes last year, worse recently.  Can happen in summer, not just wintertime.  Event last week bled for a few minutes.  Passed clots at the time.  Can happen spontaneously.  Not anticoagulated.  Not on aspirin.  Bleeding from R side only when it happens.  No using flonase or nasal sprays.    D/w pt about using afrin if needed, with a bleed.  Routine management, pinching nose, not blowing the nose, discussed with patient.  Meds, vitals, and allergies reviewed.   ROS: Per HPI unless specifically indicated in ROS section   Nad Ncat TM wnl  OP wnl Minimal clotted blood on the lateral R nostril. No mass or ulceration seen.   No active bleed.   L nostril normal.  Neck supple, no LA

## 2024-05-31 NOTE — Patient Instructions (Addendum)
 If you have a bleed, pinch your nose for 5 minutes at a time.  Use afrin if needed.   Go to the lab on the way out.   If you have mychart we'll likely use that to update you.     Try not to sneeze/blow your nose.   Take care.  Glad to see you.  If you have any more troubles, then let me me know and we'll refer to ENT.

## 2024-06-02 ENCOUNTER — Ambulatory Visit: Payer: Self-pay | Admitting: Family Medicine

## 2024-06-02 DIAGNOSIS — R040 Epistaxis: Secondary | ICD-10-CM | POA: Insufficient documentation

## 2024-06-02 NOTE — Assessment & Plan Note (Addendum)
 History of.  Discussed options.  We could refer to ENT now or send her if she has another episode.  She wanted to defer referral at this point.  If she has another episode she could pinch her nose for 5 minutes.  If she still has bleeding at that point she can spray Afrin into her nostril and pinch her nose for another 5 minutes.  If that does not help after 1 or 2 courses, she would need to get the area packed.  There is no indication for packing right now.  I do not see any ominous findings.  I do see small amount of clotted blood on the lateral side of the right nostril but I do not see any other significant worrisome changes.  Check CBC today.  She is going to monitor her blood pressure was above and update me as needed.  She agrees with plan.

## 2024-06-03 ENCOUNTER — Other Ambulatory Visit: Payer: Self-pay

## 2024-06-03 ENCOUNTER — Encounter
Admission: RE | Admit: 2024-06-03 | Discharge: 2024-06-03 | Disposition: A | Discharge status: Home / Self Care | Source: Ambulatory Visit | Attending: Obstetrics | Admitting: Obstetrics

## 2024-06-03 DIAGNOSIS — E119 Type 2 diabetes mellitus without complications: Secondary | ICD-10-CM

## 2024-06-03 DIAGNOSIS — Z01812 Encounter for preprocedural laboratory examination: Secondary | ICD-10-CM

## 2024-06-03 DIAGNOSIS — R9389 Abnormal findings on diagnostic imaging of other specified body structures: Secondary | ICD-10-CM

## 2024-06-03 DIAGNOSIS — Z01810 Encounter for preprocedural cardiovascular examination: Secondary | ICD-10-CM

## 2024-06-03 HISTORY — DX: Pneumonia, unspecified organism: J18.9

## 2024-06-03 HISTORY — DX: Sleep apnea, unspecified: G47.30

## 2024-06-03 NOTE — Patient Instructions (Addendum)
 Your procedure is scheduled on: 06/10/24 - Monday Report to the Registration Desk on the 1st floor of the [facility]. To find out your arrival time, please call [phone] between 1PM - 3PM on: 06/07/24 - Friday If your arrival time is 6:00 am, do not arrive before that time as the [facility] entrance doors do not open until 6:00 am.  REMEMBER: Instructions that are not followed completely may result in serious medical risk, up to and including death; or upon the discretion of your surgeon and anesthesiologist your surgery may need to be rescheduled.  Do not eat food or drink any liquids after midnight the night before surgery.  No gum chewing or hard candies.  One week prior to surgery: Stop Anti-inflammatories (NSAIDS) such as Advil , Aleve, Ibuprofen , Motrin , Naproxen, Naprosyn and Aspirin based products such as Excedrin, Goody's Powder, BC Powder.You may take Tylenol  if needed for pain up until the day of surgery.   Stop ANY OVER THE COUNTER supplements until after surgery.  metFORMIN  (GLUCOPHAGE ) hold 2 days prior to surgery.  Dulaglutide (TRULICITY) hold 7 days prior to surgery   ON THE DAY OF SURGERY ONLY TAKE THESE MEDICATIONS WITH SIPS OF WATER:  escitalopram  (LEXAPRO )  levothyroxine  (SYNTHROID ) tamoxifen  (NOLVADEX )     No Alcohol for 24 hours before or after surgery.  No Smoking including e-cigarettes for 24 hours before surgery.  No chewable tobacco products for at least 6 hours before surgery.  No nicotine patches on the day of surgery.  Do not use any recreational drugs for at least a week (preferably 2 weeks) before your surgery.  Please be advised that the combination of cocaine and anesthesia may have negative outcomes, up to and including death. If you test positive for cocaine, your surgery will be cancelled.  On the morning of surgery brush your teeth with toothpaste and water, you may rinse your mouth with mouthwash if you wish. Do not swallow any  toothpaste or mouthwash.  Use CHG Soap or wipes as directed on instruction sheet.  Do not wear jewelry, make-up, hairpins, clips or nail polish.  For welded (permanent) jewelry: bracelets, anklets, waist bands, etc.  Please have this removed prior to surgery.  If it is not removed, there is a chance that hospital personnel will need to cut it off on the day of surgery.  Do not wear lotions, powders, or perfumes.   Do not shave body hair from the neck down 48 hours before surgery.  Contact lenses, hearing aids and dentures may not be worn into surgery.  Do not bring valuables to the hospital. [facility] is not responsible for any missing/lost belongings or valuables.   Notify your doctor if there is any change in your medical condition (cold, fever, infection).  Wear comfortable clothing (specific to your surgery type) to the hospital.  After surgery, you can help prevent lung complications by doing breathing exercises.  Take deep breaths and cough every 1-2 hours. Your doctor may order a device called an Incentive Spirometer to help you take deep breaths.  When coughing or sneezing, hold a pillow firmly against your incision with both hands. This is called splinting. Doing this helps protect your incision. It also decreases belly discomfort.  If you are being admitted to the hospital overnight, leave your suitcase in the car. After surgery it may be brought to your room.  In case of increased patient census, it may be necessary for you, the patient, to continue your postoperative care in  the Same Day Surgery department.  If you are being discharged the day of surgery, you will not be allowed to drive home. You will need a responsible individual to drive you home and stay with you for 24 hours after surgery.   If you are taking public transportation, you will need to have a responsible individual with you.  Please call the Pre-admissions Testing Dept. at [phone] if you have  any questions about these instructions.  Surgery Visitation Policy:  Patients having surgery or a procedure may have two visitors.  Children under the age of 12 must have an adult with them who is not the patient.  Inpatient Visitation:    Visiting hours are 7 a.m. to 8 p.m. Up to four visitors are allowed at one time in a patient room. The visitors may rotate out with other people during the day.  One visitor age 30 or older may stay with the patient overnight and must be in the room by 8 p.m.   [employer]ch[employer], [employer] to address health-related social needs:  [email or web address]

## 2024-06-04 ENCOUNTER — Encounter: Payer: Self-pay | Admitting: Urgent Care

## 2024-06-04 ENCOUNTER — Encounter: Payer: Self-pay | Admitting: Certified Registered"

## 2024-06-05 ENCOUNTER — Encounter: Payer: Self-pay | Admitting: Family Medicine

## 2024-06-07 ENCOUNTER — Encounter
Admission: RE | Admit: 2024-06-07 | Discharge: 2024-06-07 | Disposition: A | Source: Ambulatory Visit | Attending: Obstetrics

## 2024-06-07 DIAGNOSIS — E119 Type 2 diabetes mellitus without complications: Secondary | ICD-10-CM | POA: Insufficient documentation

## 2024-06-07 DIAGNOSIS — Z01812 Encounter for preprocedural laboratory examination: Secondary | ICD-10-CM

## 2024-06-07 DIAGNOSIS — Z01810 Encounter for preprocedural cardiovascular examination: Secondary | ICD-10-CM

## 2024-06-07 DIAGNOSIS — Z01818 Encounter for other preprocedural examination: Secondary | ICD-10-CM | POA: Insufficient documentation

## 2024-06-07 LAB — BASIC METABOLIC PANEL WITH GFR
Anion gap: 15 (ref 5–15)
BUN: 14 mg/dL (ref 6–20)
CO2: 23 mmol/L (ref 22–32)
Calcium: 9.4 mg/dL (ref 8.9–10.3)
Chloride: 101 mmol/L (ref 98–111)
Creatinine, Ser: 0.63 mg/dL (ref 0.44–1.00)
GFR, Estimated: >60 mL/min
Glucose, Bld: 242 mg/dL — ABNORMAL HIGH (ref 70–99)
Potassium: 3.6 mmol/L (ref 3.5–5.1)
Sodium: 139 mmol/L (ref 135–145)

## 2024-06-10 ENCOUNTER — Encounter: Admission: RE | Payer: Self-pay | Source: Home / Self Care

## 2024-06-10 ENCOUNTER — Ambulatory Visit: Admission: RE | Admit: 2024-06-10 | Source: Home / Self Care | Admitting: Obstetrics

## 2024-06-24 ENCOUNTER — Ambulatory Visit: Admission: RE | Admit: 2024-06-24 | Source: Home / Self Care | Admitting: Obstetrics

## 2024-06-24 ENCOUNTER — Encounter: Admission: RE | Payer: Self-pay | Source: Home / Self Care

## 2024-06-27 ENCOUNTER — Encounter: Admitting: Obstetrics

## 2024-06-28 ENCOUNTER — Inpatient Hospital Stay: Attending: Hematology and Oncology | Admitting: Hematology and Oncology

## 2024-07-16 ENCOUNTER — Encounter: Admitting: Obstetrics
